# Patient Record
Sex: Male | Born: 1940 | Race: Black or African American | Hispanic: No | Marital: Married | Smoking: Former smoker
Health system: Southern US, Community
[De-identification: ages and names within clinical notes are randomized; demographics above are authoritative.]

## PROBLEM LIST (undated history)

## (undated) DIAGNOSIS — I739 Peripheral vascular disease, unspecified: Secondary | ICD-10-CM

## (undated) DIAGNOSIS — N189 Chronic kidney disease, unspecified: Secondary | ICD-10-CM

## (undated) DIAGNOSIS — Z77098 Contact with and (suspected) exposure to other hazardous, chiefly nonmedicinal, chemicals: Secondary | ICD-10-CM

## (undated) DIAGNOSIS — E785 Hyperlipidemia, unspecified: Secondary | ICD-10-CM

## (undated) DIAGNOSIS — N529 Male erectile dysfunction, unspecified: Secondary | ICD-10-CM

## (undated) DIAGNOSIS — Z89519 Acquired absence of unspecified leg below knee: Secondary | ICD-10-CM

## (undated) DIAGNOSIS — M1990 Unspecified osteoarthritis, unspecified site: Secondary | ICD-10-CM

## (undated) DIAGNOSIS — C801 Malignant (primary) neoplasm, unspecified: Secondary | ICD-10-CM

## (undated) DIAGNOSIS — I472 Ventricular tachycardia, unspecified: Secondary | ICD-10-CM

## (undated) DIAGNOSIS — I4729 Other ventricular tachycardia: Secondary | ICD-10-CM

## (undated) DIAGNOSIS — I10 Essential (primary) hypertension: Secondary | ICD-10-CM

## (undated) HISTORY — DX: Contact with and (suspected) exposure to other hazardous, chiefly nonmedicinal, chemicals: Z77.098

## (undated) HISTORY — DX: Chronic kidney disease, unspecified: N18.9

## (undated) HISTORY — DX: Ventricular tachycardia: I47.2

## (undated) HISTORY — DX: Acquired absence of unspecified leg below knee: Z89.519

## (undated) HISTORY — DX: Other ventricular tachycardia: I47.29

## (undated) HISTORY — DX: Male erectile dysfunction, unspecified: N52.9

## (undated) HISTORY — DX: Hyperlipidemia, unspecified: E78.5

## (undated) HISTORY — DX: Unspecified osteoarthritis, unspecified site: M19.90

## (undated) HISTORY — DX: Peripheral vascular disease, unspecified: I73.9

## (undated) HISTORY — PX: PROSTATE SURGERY: SHX751

## (undated) HISTORY — DX: Ventricular tachycardia, unspecified: I47.20

## (undated) HISTORY — DX: Malignant (primary) neoplasm, unspecified: C80.1

---

## 1998-11-29 ENCOUNTER — Other Ambulatory Visit: Admission: RE | Admit: 1998-11-29 | Discharge: 1998-11-29 | Payer: Self-pay | Admitting: Urology

## 1998-12-25 ENCOUNTER — Encounter: Admission: RE | Admit: 1998-12-25 | Discharge: 1999-03-25 | Payer: Self-pay | Admitting: *Deleted

## 1999-03-26 ENCOUNTER — Encounter: Admission: RE | Admit: 1999-03-26 | Discharge: 1999-06-24 | Payer: Self-pay | Admitting: *Deleted

## 2003-01-01 ENCOUNTER — Encounter (INDEPENDENT_AMBULATORY_CARE_PROVIDER_SITE_OTHER): Payer: Self-pay | Admitting: *Deleted

## 2003-01-01 ENCOUNTER — Ambulatory Visit (HOSPITAL_COMMUNITY): Admission: RE | Admit: 2003-01-01 | Discharge: 2003-01-01 | Payer: Self-pay | Admitting: Gastroenterology

## 2003-06-29 ENCOUNTER — Emergency Department (HOSPITAL_COMMUNITY): Admission: EM | Admit: 2003-06-29 | Discharge: 2003-06-29 | Payer: Self-pay | Admitting: Family Medicine

## 2007-03-10 HISTORY — PX: AMPUTATION: SHX166

## 2008-02-27 ENCOUNTER — Inpatient Hospital Stay (HOSPITAL_COMMUNITY): Admission: EM | Admit: 2008-02-27 | Discharge: 2008-03-01 | Payer: Self-pay | Admitting: Emergency Medicine

## 2008-02-28 ENCOUNTER — Ambulatory Visit: Payer: Self-pay | Admitting: Vascular Surgery

## 2008-02-28 ENCOUNTER — Ambulatory Visit: Payer: Self-pay | Admitting: Internal Medicine

## 2008-02-28 ENCOUNTER — Encounter (INDEPENDENT_AMBULATORY_CARE_PROVIDER_SITE_OTHER): Payer: Self-pay | Admitting: Emergency Medicine

## 2008-04-20 ENCOUNTER — Inpatient Hospital Stay (HOSPITAL_COMMUNITY): Admission: EM | Admit: 2008-04-20 | Discharge: 2008-04-23 | Payer: Self-pay | Admitting: Emergency Medicine

## 2008-04-23 ENCOUNTER — Encounter (INDEPENDENT_AMBULATORY_CARE_PROVIDER_SITE_OTHER): Payer: Self-pay | Admitting: Cardiology

## 2008-04-23 ENCOUNTER — Ambulatory Visit: Payer: Self-pay | Admitting: Vascular Surgery

## 2008-08-03 ENCOUNTER — Ambulatory Visit (HOSPITAL_COMMUNITY): Admission: RE | Admit: 2008-08-03 | Discharge: 2008-08-03 | Payer: Self-pay | Admitting: Cardiology

## 2009-02-07 ENCOUNTER — Ambulatory Visit: Payer: Self-pay | Admitting: Family Medicine

## 2009-03-14 ENCOUNTER — Ambulatory Visit: Payer: Self-pay | Admitting: Family Medicine

## 2009-04-01 ENCOUNTER — Ambulatory Visit: Payer: Self-pay | Admitting: Family Medicine

## 2009-05-03 ENCOUNTER — Ambulatory Visit: Payer: Self-pay | Admitting: Family Medicine

## 2009-06-21 ENCOUNTER — Ambulatory Visit: Payer: Self-pay | Admitting: Family Medicine

## 2009-08-01 ENCOUNTER — Ambulatory Visit: Payer: Self-pay | Admitting: Family Medicine

## 2009-08-08 ENCOUNTER — Ambulatory Visit: Payer: Self-pay | Admitting: Family Medicine

## 2009-11-01 ENCOUNTER — Ambulatory Visit: Payer: Self-pay | Admitting: Family Medicine

## 2009-11-04 ENCOUNTER — Ambulatory Visit: Payer: Self-pay | Admitting: Family Medicine

## 2009-11-08 ENCOUNTER — Ambulatory Visit: Payer: Self-pay | Admitting: Family Medicine

## 2009-12-25 ENCOUNTER — Encounter: Payer: Self-pay | Admitting: Family Medicine

## 2010-01-03 ENCOUNTER — Encounter: Payer: Self-pay | Admitting: Family Medicine

## 2010-01-04 ENCOUNTER — Encounter: Payer: Self-pay | Admitting: Family Medicine

## 2010-02-13 ENCOUNTER — Telehealth (INDEPENDENT_AMBULATORY_CARE_PROVIDER_SITE_OTHER): Payer: Self-pay | Admitting: *Deleted

## 2010-04-08 NOTE — Letter (Signed)
Summary: records from the [facility]  records from the [facility]   Imported By: Rosine Beat 01/03/2010 15:08:31  _____________________________________________________________________  External Attachment:    Type:   Image     Comment:   External Document

## 2010-04-08 NOTE — Medication Information (Signed)
Summary: PAPERWORK FROM [facility]  PAPERWORK FROM [facility]   Imported By: Rosine Beat 01/04/2010 14:17:17  _____________________________________________________________________  External Attachment:    Type:   Image     Comment:   External Document

## 2010-04-08 NOTE — Medication Information (Signed)
Summary: pharmacy fax  pharmacy fax   Imported By: Dorna Leitz 12/25/2009 13:58:42  _____________________________________________________________________  External Attachment:    Type:   Image     Comment:   External Document

## 2010-05-06 NOTE — Progress Notes (Signed)
  Phone Note Call from Patient   Caller: Patient Reason for Call: Refill Medication, Talk to Doctor Summary of Call: Patient called on 02/12/2010 to talking to Dr. Laural Benes regarding Insulin pump.  He would like to continue to see Dr. Laural Benes as a patient but he is unable to travel to [place].  He was wondering if Dr. Laural Benes could referral him to another primary care physician in the [place] area.  I talked to Dr. Laural Benes and he wanted to refer Mr. Hofferber to Dr. Margaretmary Bayley.  I will call the patient to let him know what Dr. Laural Benes had suggested.  Initial call taken by: Dorna Leitz,  February 13, 2010 12:36 PM  Follow-up for Phone Call        Please call patient and set him up an appointment with Dr. Margaretmary Bayley, Endocrinologist in [place], [place].  Dr. Chestine Spore is a physician in the [facility].  Rodney Langton, M.D., F.A.A.F.P.  February 13, 2010  Follow-up by: Rodney Langton  Additional Follow-up for Phone Call Additional follow up Details #1::        I spoke to the patient's wife and she states that he was at work and for me to call back on Friday morning to set up an appointment for him to see an endocrinologist.   Additional Follow-up by: Rosine Beat

## 2010-05-12 ENCOUNTER — Encounter: Payer: Self-pay | Admitting: Family Medicine

## 2010-05-12 ENCOUNTER — Ambulatory Visit: Payer: Medicare Other | Admitting: Family Medicine

## 2010-05-12 DIAGNOSIS — N183 Chronic kidney disease, stage 3 unspecified: Secondary | ICD-10-CM | POA: Insufficient documentation

## 2010-05-12 DIAGNOSIS — E119 Type 2 diabetes mellitus without complications: Secondary | ICD-10-CM | POA: Insufficient documentation

## 2010-05-12 DIAGNOSIS — Z794 Long term (current) use of insulin: Secondary | ICD-10-CM

## 2010-05-12 DIAGNOSIS — F332 Major depressive disorder, recurrent severe without psychotic features: Secondary | ICD-10-CM | POA: Insufficient documentation

## 2010-05-12 DIAGNOSIS — I739 Peripheral vascular disease, unspecified: Secondary | ICD-10-CM | POA: Insufficient documentation

## 2010-05-12 DIAGNOSIS — Z89519 Acquired absence of unspecified leg below knee: Secondary | ICD-10-CM | POA: Insufficient documentation

## 2010-05-12 DIAGNOSIS — N529 Male erectile dysfunction, unspecified: Secondary | ICD-10-CM

## 2010-05-12 DIAGNOSIS — E1165 Type 2 diabetes mellitus with hyperglycemia: Secondary | ICD-10-CM

## 2010-05-12 DIAGNOSIS — E118 Type 2 diabetes mellitus with unspecified complications: Secondary | ICD-10-CM

## 2010-05-12 DIAGNOSIS — E1139 Type 2 diabetes mellitus with other diabetic ophthalmic complication: Secondary | ICD-10-CM

## 2010-05-12 DIAGNOSIS — E1142 Type 2 diabetes mellitus with diabetic polyneuropathy: Secondary | ICD-10-CM

## 2010-05-12 DIAGNOSIS — IMO0002 Reserved for concepts with insufficient information to code with codable children: Secondary | ICD-10-CM

## 2010-05-12 DIAGNOSIS — Z8672 Personal history of thrombophlebitis: Secondary | ICD-10-CM

## 2010-05-13 ENCOUNTER — Telehealth: Payer: Self-pay | Admitting: Family Medicine

## 2010-05-20 NOTE — Assessment & Plan Note (Signed)
Summary: regarding diabete follow up   Vital Signs:  Patient Profile:   70 Years Old Male CC:      Follow Up Diabetes Mellitus  Weight:      250 pounds O2 Sat:      100 % O2 treatment:    Room Air Temp:     97.8 degrees F oral  Pulse rate:   67 / minute Pulse rhythm:   regular Resp:     12 per minute BP sitting:   126 / 71  (left arm)  Pt. in pain?   no  Vitals Entered By: Standley Dakins MD (May 12, 2010 5:57 PM)                   Current Allergies (reviewed today): No known allergies History of Present Illness History from: patient Chief Complaint: Follow Up Diabetes Mellitus  History of Present Illness: The patient presented today for diabetes follow up.  I had become increasingly concerned because I had not received notice from him that he had found an endocrinologist in [place] to see.   I requested to see him in order to complete the order request for his insulin pump supplies, etc.  He presents today and reports that he continues to see the CDE Ronal Fear in [place].  She has made some minor modifications to his insulin pump settings.  He reports continued labile blood sugars.  He reports occasional low blood sugars but no severe hypoglycemia reactions reported.    He reports the lowest BG was 73 and they always seem to be related to carbohydrate counting.  He reports that he continues to have a consistend diet.  He is telling me now that he consumes about 40 carbs per meal.  He says that he has toast, veggie burgers, veggies regularly.  He reports that his physical activity has diminished and his weight is increasing.  He is concerned about excessive flatus thought to be related to his veggie consumption and lactose intolerance.    His BG ranges have been 150 - 300.  He is reporting to me now that he does bolus for meals. He reports that his pump was recently downloaded by Ronal Fear and we can send to get those records with his permission which we have done. He  is reporting that he had a pneumovax 2 years ago.  He is interested in getting the shingles vaccine.  He is reporting that he is seeing his [place] physician and cardiologist and remains on Coumadin Warfarin (from previous blood clot).    ANIMAS INSULIN PUMP SETTINGS: Basal - 3.600 Units/HR TDD = 52.53 Bolus - I:C = 1:3 TDD = 16.65  ISF - 8 Target BG - 130 +/- 30 IOB - 4 hours  REVIEW OF SYSTEMS Constitutional Symptoms       Complains of weight gain and fatigue.     Denies fever, chills, night sweats, and weight loss.  Eyes       Complains of glasses.      Denies change in vision, eye pain, eye discharge, contact lenses, and eye surgery. Ear/Nose/Throat/Mouth       Denies hearing loss/aids, change in hearing, ear pain, ear discharge, dizziness, frequent runny nose, frequent nose bleeds, sinus problems, sore throat, hoarseness, and tooth pain or bleeding.  Respiratory       Denies dry cough, productive cough, wheezing, shortness of breath, asthma, bronchitis, and emphysema/COPD.  Cardiovascular       Denies murmurs, chest pain,  and tires easily with exhertion.    Gastrointestinal       Denies stomach pain, nausea/vomiting, diarrhea, constipation, blood in bowel movements, and indigestion. Genitourniary       Denies painful urination, kidney stones, and loss of urinary control. Neurological       Denies paralysis, seizures, and fainting/blackouts. Musculoskeletal       Denies muscle pain, joint pain, joint stiffness, decreased range of motion, redness, swelling, muscle weakness, and gout.      Comments: prosthetic right leg BKA Skin       Denies bruising, unusual mles/lumps or sores, and hair/skin or nail changes.  Psych       Complains of depression.      Denies mood changes, temper/anger issues, anxiety/stress, speech problems, and sleep problems.  Past History:  Past Medical History: Type 2 DM, insulin requiring, with triopathy - ON ANIMAS ONE TOUCH PING  INSULIN PUMP Metabolic  Syndrome HTN Hyperlipidemia PVD - s/p Right BKA H/O Blood Clot - On Coumadin Erectile Dysfunction  Past Surgical History: RIGHT BELOW KNEE AMPUTATION  Family History: Diabetes Mellitus Hypertension Alzheimer's Dementia  Social History: His moher bore him at age 55.  She lives with him now.  She has dementia and they care for her.  The patient is married.  He denies Tobacco, ETOH and recreational drug useage.  Physical Exam General appearance: well developed, well nourished, no acute distress Head: normocephalic, atraumatic Eyes: arcus senilis bilateral noted; conjunctivae and lids normal Pupils: equal, round, reactive to light Ears: normal, no lesions or deformities Nasal: mucosa pink, nonedematous, no septal deviation, turbinates normal Oral/Pharynx: dry mucous membranes, tongue normal, posterior pharynx without erythema or exudate Neck: neck supple,  trachea midline, no masses Chest/Lungs: no rales, wheezes, or rhonchi bilateral, breath sounds equal without effort Heart: regular rate and  rhythm, no murmur Abdomen: soft, non-tender without obvious organomegaly Extremities: right BKA with prosthesis in place.  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  The patient verbalized clear understanding.  The patient was given instructions to return if symptoms don't improve, worsen or new changes develop.  If it is not during clinic hours and the patient cannot get back to this clinic then the patient was told to seek medical care at an available urgent care or emergency department.  The patient verbalized understanding.   Demonstrates willingness to comply.  Plan Planning Comments:   Concentrated on Diet and Increasing Physical Activity Even it only involves Upper Extremities. The patient verbalized clear understanding.   Continue to follow up with Ronal Fear CDE and Sent for her records on the patient's visits.  Follow Up: 3 months   The patient and/or caregiver has been counseled thoroughly with regard to medications prescribed including dosage, schedule, interactions, rationale for use, and possible side effects and they verbalize understanding.  Diagnoses and expected course of recovery discussed and will return if not improved as expected or if the condition worsens. Patient  and/or caregiver verbalized understanding.   Patient Instructions: 1)  Check your blood sugars regularly. If your readings are usually above : or below 70 you should contact our office. 2)  It is important that your Diabetic A1c level is checked every 3 months. 3)  See your eye doctor yearly to check for diabetic eye damage. 4)  Check your feet each night for sore areas, calluses or signs of infection. 5)  Check your Blood Pressure regularly. If it is above: 140/90 you should make an appointment. 6)  It is important that you exercise regularly at least 20 minutes 5 times a week. If you develop chest pain, have severe difficulty breathing, or feel very tired , stop exercising immediately and seek medical attention. 7)  Go to the lab in [place] and get your labs done.  Please call us when you have had the labs done and we will call to get the results.  8)  You  need to lose weight. Consider a lower calorie diet and regular exercise.  9)  Try a lower carb diet and see if you can lose 5 lbs to 8 lbs by next office visit.  10)  Try to find an endocrinologist in [place] to follow you.  I recommend calling Dr. Ardis Rowan and ask if he is seeing any more new patients or his PA assistant physician.  11)  The patient was informed that there is no on-call provider or services available at this clinic during off-hours (when the clinic is closed).  If the patient developed a problem or concern that required immediate attention, the patient was advised to go the the nearest available urgent care or emergency department for medical care.  The patient verbalized understanding.          Hgb A1C:     8.9   %  I spent 2 hours directly with the patient today reviewing his insulin pump and settings, reviewing his complicated medical history, educating him on proper diabetes care and diet, encouraging a new exercise program that is challenging and meaningful and coordinating care with his specialty providers including requesting records from [place], CDE from [facility] in [place], [place], MD, CDE, FAAFP   Appended Document: regarding diabete follow up I reviewed patients complete medical records as faxed over to me from [facility].  Rodney Langton, MD, CDE, [employer]

## 2010-05-20 NOTE — Progress Notes (Signed)
  Phone Note Call from Patient   Summary of Call: Patient called in stated that [facility] has the vaccine for shingles pharmacy number is [phone] [facility] on [address] Initial call taken by: Eugenio Hoes,  May 13, 2010 5:11 PM    New/Updated Medications: ZOSTAVAX 45409 UNT/0.65ML SOLR (ZOSTER VACCINE LIVE) to be given as directed Prescriptions: ZOSTAVAX 81191 UNT/0.65ML SOLR (ZOSTER VACCINE LIVE) to be given as directed  #1 x 0   Entered and Authorized by:   Standley Dakins MD   Signed by:   Standley Dakins MD on 05/16/2010   Method used:   Electronically to        [facility] [address] [phone]* (retail)       [address]       [place], [place]  [address]       Ph: [phone] or [phone]       Fax: [phone]   RxID:   [id number]

## 2010-05-23 ENCOUNTER — Encounter: Payer: Self-pay | Admitting: Family Medicine

## 2010-05-24 ENCOUNTER — Telehealth: Payer: Self-pay | Admitting: Family Medicine

## 2010-05-24 ENCOUNTER — Encounter: Payer: Self-pay | Admitting: Family Medicine

## 2010-05-24 LAB — CONVERTED CEMR LAB
Albumin: 3.8 g/dL (ref 3.5–5.2)
Alkaline Phosphatase: 81 units/L (ref 39–117)
BUN: 38 mg/dL — ABNORMAL HIGH (ref 6–23)
Creatinine, Ser: 1.63 mg/dL — ABNORMAL HIGH (ref 0.40–1.50)
Glucose, Bld: 250 mg/dL — ABNORMAL HIGH (ref 70–99)
HCT: 38.8 % — ABNORMAL LOW (ref 39.0–52.0)
HDL: 28 mg/dL — ABNORMAL LOW (ref >39)
Hemoglobin: 12.7 g/dL — ABNORMAL LOW (ref 13.0–17.0)
LDL Cholesterol: 78 mg/dL (ref 0–99)
MCHC: 32.7 g/dL (ref 30.0–36.0)
MCV: 86.4 fL (ref 78.0–100.0)
Potassium: 4.7 meq/L (ref 3.5–5.3)
RBC: 4.49 M/uL (ref 4.22–5.81)
Total Bilirubin: 0.3 mg/dL (ref 0.3–1.2)
Total CHOL/HDL Ratio: 5.6
Triglycerides: 250 mg/dL — ABNORMAL HIGH (ref <150)

## 2010-05-27 NOTE — Progress Notes (Signed)
Summary: ZostaVax Vaccine Given on 03/24/10.   Phone Note Call from Patient   Caller: Patient Call For: Lab Results Summary of Call: The patient was notified of his lab results.  He said that he got the Shingles Vaccine yesterday at his pharmacy.  Rodney Langton, MD, CDE, FAAFP  Initial call taken by: Standley Dakins MD,  May 24, 2010 2:57 PM

## 2010-06-04 ENCOUNTER — Encounter: Payer: Self-pay | Admitting: Family Medicine

## 2010-06-10 NOTE — Letter (Signed)
Summary: [facility]  [facility]   Imported By: Eugenio Hoes 06/04/2010 12:52:47  _____________________________________________________________________  External Attachment:    Type:   Image     Comment:   External Document

## 2010-06-10 NOTE — Letter (Signed)
Summary: Medical record   Medical record   Imported By: Eugenio Hoes 06/04/2010 12:48:31  _____________________________________________________________________  External Attachment:    Type:   Image     Comment:   External Document

## 2010-06-24 LAB — CARDIAC PANEL(CRET KIN+CKTOT+MB+TROPI)
CK, MB: 2.9 ng/mL (ref 0.3–4.0)
Troponin I: <0.01 ng/mL (ref 0.00–0.06)

## 2010-06-24 LAB — GLUCOSE, CAPILLARY
Glucose-Capillary: 127 mg/dL — ABNORMAL HIGH (ref 70–99)
Glucose-Capillary: 154 mg/dL — ABNORMAL HIGH (ref 70–99)
Glucose-Capillary: 157 mg/dL — ABNORMAL HIGH (ref 70–99)
Glucose-Capillary: 164 mg/dL — ABNORMAL HIGH (ref 70–99)
Glucose-Capillary: 167 mg/dL — ABNORMAL HIGH (ref 70–99)
Glucose-Capillary: 367 mg/dL — ABNORMAL HIGH (ref 70–99)

## 2010-06-24 LAB — COMPREHENSIVE METABOLIC PANEL
AST: 21 U/L (ref 0–37)
BUN: 19 mg/dL (ref 6–23)
CO2: 27 mEq/L (ref 19–32)
Calcium: 9.2 mg/dL (ref 8.4–10.5)
Creatinine, Ser: 1.2 mg/dL (ref 0.4–1.5)
GFR calc Af Amer: >60 mL/min (ref >60)
GFR calc non Af Amer: >60 mL/min (ref >60)

## 2010-06-24 LAB — DIFFERENTIAL
Basophils Absolute: 0.1 10*3/uL (ref 0.0–0.1)
Basophils Relative: 1 % (ref 0–1)
Eosinophils Relative: 1 % (ref 0–5)
Lymphocytes Relative: 21 % (ref 12–46)
Monocytes Absolute: 0.7 10*3/uL (ref 0.1–1.0)

## 2010-06-24 LAB — URINALYSIS, ROUTINE W REFLEX MICROSCOPIC
Bilirubin Urine: NEGATIVE
Ketones, ur: NEGATIVE mg/dL
Nitrite: NEGATIVE
Protein, ur: >300 mg/dL — AB
Specific Gravity, Urine: 1.015 (ref 1.005–1.030)
Urobilinogen, UA: 1 mg/dL (ref 0.0–1.0)

## 2010-06-24 LAB — TSH: TSH: 0.918 u[IU]/mL (ref 0.350–4.500)

## 2010-06-24 LAB — PROTIME-INR
INR: 1 (ref 0.00–1.49)
INR: 1.1 (ref 0.00–1.49)
INR: 1.1 (ref 0.00–1.49)
Prothrombin Time: 14.5 seconds (ref 11.6–15.2)
Prothrombin Time: 18.8 seconds — ABNORMAL HIGH (ref 11.6–15.2)

## 2010-06-24 LAB — BASIC METABOLIC PANEL
BUN: 17 mg/dL (ref 6–23)
CO2: 25 mEq/L (ref 19–32)
Calcium: 9.3 mg/dL (ref 8.4–10.5)
Chloride: 98 mEq/L (ref 96–112)
Creatinine, Ser: 1.23 mg/dL (ref 0.4–1.5)
Glucose, Bld: 137 mg/dL — ABNORMAL HIGH (ref 70–99)
Glucose, Bld: 220 mg/dL — ABNORMAL HIGH (ref 70–99)
Sodium: 134 mEq/L — ABNORMAL LOW (ref 135–145)

## 2010-06-24 LAB — CBC
HCT: 34 % — ABNORMAL LOW (ref 39.0–52.0)
HCT: 34.3 % — ABNORMAL LOW (ref 39.0–52.0)
HCT: 37.1 % — ABNORMAL LOW (ref 39.0–52.0)
Hemoglobin: 11.6 g/dL — ABNORMAL LOW (ref 13.0–17.0)
Hemoglobin: 12.7 g/dL — ABNORMAL LOW (ref 13.0–17.0)
MCHC: 34.2 g/dL (ref 30.0–36.0)
MCV: 86 fL (ref 78.0–100.0)
MCV: 87 fL (ref 78.0–100.0)
Platelets: 201 10*3/uL (ref 150–400)
RBC: 3.99 MIL/uL — ABNORMAL LOW (ref 4.22–5.81)
RDW: 15.5 % (ref 11.5–15.5)
WBC: 11 10*3/uL — ABNORMAL HIGH (ref 4.0–10.5)
WBC: 9 10*3/uL (ref 4.0–10.5)

## 2010-06-24 LAB — TROPONIN I: Troponin I: <0.01 ng/mL (ref 0.00–0.06)

## 2010-06-24 LAB — HEPARIN LEVEL (UNFRACTIONATED): Heparin Unfractionated: 0.54 IU/mL (ref 0.30–0.70)

## 2010-06-24 LAB — POCT CARDIAC MARKERS: Troponin i, poc: 0.13 ng/mL — ABNORMAL HIGH (ref 0.00–0.09)

## 2010-06-24 LAB — CK TOTAL AND CKMB (NOT AT ARMC): Total CK: 124 U/L (ref 7–232)

## 2010-07-22 NOTE — Discharge Summary (Signed)
NAME:  Patrick, Shelton NO.:  [id number]   MEDICAL RECORD NO.:  [id number]          PATIENT TYPE:  INP   LOCATION:  2008                         FACILITY:  [facility]   PHYSICIAN:  Sheliah Mends, MD      DATE OF BIRTH:  [birth year]   DATE OF ADMISSION:  04/20/2008  DATE OF DISCHARGE:  04/23/2008                               DISCHARGE SUMMARY   DISCHARGE DIAGNOSES:  1. Pulmonary embolism bilaterally.  2. Recent right below-knee amputation in January 2010 in [place].  3. Type 2 insulin-dependent diabetes.  4. Treated hypertension.   HOSPITAL COURSE:  [name] is a 70 year old male who is followed at  the [place].  He has diabetes and vascular disease.  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  In November 2009, he had a right great toe amputation.  He then was  admitted in December with osteomyelitis of his right foot.  Ultimately,  his vascular surgeon in [facility] had to perform a right BKA, this was  done last month.  The patient had had some increasing shortness of  breath at home.  He was admitted through the emergency room.  CT scan  confirmed pulmonary embolism bilaterally.  He was put on Lovenox and  Coumadin.  We feel he can be discharged on April 23, 2008.  He will  need Lovenox bridging to Coumadin as an outpatient.  Venous Dopplers  were done prior to discharge, and the results of these are pending at  discharge.   DISCHARGE MEDICATIONS:  1. Elavil 25 mg nightly.  2. Glyburide 10 mg a day.  3. Hydrochlorothiazide 25 mg a day.  4. Lantus 80 units a day.  5. Lexapro 20 mg nightly.  6. Lisinopril 40 mg a day.  7. Norvasc 10 mg a day.  8. Pepcid 20 mg twice a day.  9. Valsartan 160 mg daily.  10.Zetia 10 mg a day.  11.Aspirin 81 mg a day.  12.Lovenox 100 mg subcu q.12 h.  13.Warfarin 5 mg 1-1/2 tablets daily or as directed.   LABORATORY DATA:  INR is 1.5.  White count 9, hemoglobin 11.6,  hematocrit 34, platelets 201.  Sodium 135, potassium 3.9, BUN 17,  creatinine  1.23.  CK-MB and troponin negative x1.  TSH is 0.91.  Urinalysis did show some proteinuria.  BNP was 50.   Chest x-ray in the ER showed plate-like atelectasis in left lower lobe.  CT revealed moderately large bilateral pulmonary emboli involving the  right upper lobe and both lower lobes.  EKG, sinus rhythm, PVCs, and  nonspecific ST changes.   DISPOSITION:  The patient is discharged in stable condition.  He has an  appointment at the [facility] today.  We will arrange for a home health nurse to  come out and draw a pro time on Wednesday.      Abelino Derrick, P.A.      Sheliah Mends, MD  Electronically Signed    LKK/MEDQ  D:  04/23/2008  T:  04/23/2008  Job:  [id number]   cc:   Camie Patience  [facility] [address]

## 2010-07-22 NOTE — Discharge Summary (Signed)
NAME:  Patrick Shelton, Patrick Shelton NO.:  [id number]   MEDICAL RECORD NO.:  [id number]          PATIENT TYPE:  INP   LOCATION:  2008                         FACILITY:  [facility]   PHYSICIAN:  Abelino Derrick, P.A.   DATE OF BIRTH:  [birth year]   DATE OF ADMISSION:  04/20/2008  DATE OF DISCHARGE:  04/23/2008                               DISCHARGE SUMMARY   ADDENDUM   At the recommendations of the pharmacist, Patrick Shelton was sent home on  Lovenox 150 mg subcu daily for 4 days.  Protime and follow up will be as  previously indicated in our office.      Abelino Derrick, P.ALenard Lance  D:  04/23/2008  T:  04/24/2008  Job:  [id number]

## 2010-07-22 NOTE — H&P (Signed)
NAME:  Patrick Shelton, Patrick Shelton NO.:  [id number]   MEDICAL RECORD NO.:  [id number]          PATIENT TYPE:  EMS   LOCATION:  ED                           [facility]   PHYSICIAN:  Peggye Pitt, M.D. DATE OF BIRTH:  [birth year]   DATE OF ADMISSION:  02/27/2008  DATE OF DISCHARGE:                              HISTORY & PHYSICAL   PRIMARY CARE PHYSICIAN:  [facility] in [place].  He is not familiar  with his doctor's name.   CHIEF COMPLAINT:  Right foot pain.   HISTORY OF PRESENT ILLNESS:  Patrick Shelton is a pleasant 70 year old  African American man with history of type 2 diabetes who on January 09, 2008, had am amputation of his left big toe secondary to osteomyelitis.  We have no records of this as all this was performed in [facility].  He  states that over the past week or so, he has experienced increase in his  right foot pain to the point where he is now on OxyContin, gabapentin  and Vicodin for the pain.  Today, he noticed some purulent drainage with  increased pain.  For that reason, he came into the emergency department  for further evaluation.  He denies any fever or chills, chest pain,  shortness of breath or any other symptoms.   ALLERGIES:  He has no known drug allergies.   PAST MEDICAL HISTORY:  Significant for the following:  1. Type 2 diabetes.  2. Hyperlipidemia.  3. Hypertension.   HOME MEDICATIONS:  1. OxyContin 10 mg p.o. b.i.d.  2. Neurontin 300 mg p.o. t.i.d.  3. Zetia 10 mg daily.  4. Hydrochlorothiazide 25 mg p.o. daily.  5. Vicodin 7.5/750 mg p.o. q.4 h. p.r.n. for pain.  6. Lisinopril 40 mg p.o. b.i.d.  7. Diovan 160 mg p.o. daily.   SOCIAL HISTORY:  He denies any tobacco, alcohol or illicit drug use.  He  is married and lives with his wife.   FAMILY HISTORY:  Only significant for multiple family members with  diabetes as well.   REVIEW OF SYSTEMS:  Negative except as otherwise mentioned in HPI.   PHYSICAL EXAMINATION:  VITAL  SIGNS:  On admission, blood pressure  152/76, heart rate 72, respirations 18, O2 saturation 96% on room air  with a temp of 98.3.  IN GENERAL:  He is alert, awake, oriented x3.  He does not appear to be  in any distress.  HEENT:  Normocephalic, atraumatic.  His pupils are equally reactive to  light and accommodation with intact extraocular movements.  HEART:  Regular rate and rhythm with no murmurs, rubs, or gallops.  He  does have frequent extra systolic beat.  LUNGS:  Clear to auscultation bilaterally.  ABDOMEN:  Soft, nontender, nondistended with positive bowel sounds.  NEUROLOGIC:  Grossly intact and nonfocal.  EXTREMITIES:  His right foot has a big toe amputation with some  purulence emanating from the stump.  He also has some erythema and edema  extending over the dorsum of his foot.  He does have positive pedal  pulses.   LABORATORIES UPON ADMISSION:  Sodium 137, potassium 4.8, chloride 101,  bicarb 25, BUN 16, creatinine 1.09 with a glucose of 130, WBCs 16.9 with  an ANC of 13.1, hemoglobin 10.4, platelets 313, BNP of 251 and lactic  acid of 1.1.   A right foot x-ray shows osteomyelitis of the first metatarsal neck,  infection with gas-forming organism in soft tissue surrounding the  second MTP joint.   ASSESSMENT/PLAN:  1. Right first metatarsal osteomyelitis.  We will place on intravenous      vancomycin and Zosyn, dose per pharmacy.  We will check blood      cultures x2.  He will need an orthopedic consultation in the      morning.  We will also have wound care evaluate his stump      infection.  2. For his diabetes, we will check a hemoglobin A1c.  We will place      him on sliding scale insulin.  3. Hypertension.  We will continue his home medications and monitor      his blood pressure.  4. Hyperlipidemia.  We will check a fasting lipid panel and continue      his home dose of Zetia.  5. For prophylaxis while in the hospital, he will be placed on      Protonix for  gastrointestinal prophylaxis and Lovenox for deep      venous thrombosis prophylaxis.      Peggye Pitt, M.D.  Electronically Signed     EH/MEDQ  D:  02/27/2008  T:  02/27/2008  Job:  [id number]

## 2010-07-22 NOTE — Consult Note (Signed)
NAME:  COLVIN, BLATT NO.:  [id number]   MEDICAL RECORD NO.:  [id number]          PATIENT TYPE:  INP   LOCATION:  1302                         FACILITY:  [facility]   PHYSICIAN:  Almedia Balls. Ranell Patrick, M.D. DATE OF BIRTH:  09-Jan-1941   DATE OF CONSULTATION:  02/28/2008  DATE OF DISCHARGE:                                 CONSULTATION   REASON FOR CONSULTATION:  Right foot osteomyelitis.   HISTORY OF PRESENT ILLNESS:  The patient is a 70 year old African  American male with type 2 diabetes and a history of right foot grade 2  amputation performed by Dr. Carollee Massed, a vascular surgeon in the [facility] system.  The patient had surgery on January 09, 2008.  He  presents to the [facility] [employer] [facility], for  evaluation of a right foot infection.  The patient was followed as  recently as last week by his vascular surgeon in [facility], who felt  like things were healing well.  He had some type of a vascular procedure  done but is unaware of exactly what that procedure was, but by the  patient's report everything looked okay as far as potential blood flow  to his leg.  He is complaining of debilitating pain, and he is on  OxyContin for pain in his foot.  He notes purulent drainage and  increased swelling and pain.   PAST MEDICAL HISTORY:  1. History of type 2 diabetes.  2. Hyperlipidemia.  3. Hypertension.   MEDICATION LIST:  Please see MAR.   SOCIAL HISTORY:  The patient denies tobacco use or alcohol use.  He is  married and lives with his wife, and his son is present here in the  room.   PHYSICAL EXAMINATION:  GENERAL:  A healthy-appearing African American  male, who is in moderate distress, alert and oriented.   Examination of the right foot with Dr. Cliffton Asters, Infectious  Disease, in attendance shows a nonhealing wound to the distal great toe  metatarsophalangeal area.  There is also evidence of a significant open  wound around the base  of the second toe where the toe meets the foot.  There is significant swelling on the right leg compared to the left leg  up to about the shin level.  I cannot appreciate pulses in the right  foot.  He does have trace pulses palpable in the left foot.  There is  absence of hair below the knee.  Radiographs demonstrating evidence of a  metatarsal neck fracture, potential osteomyelitis, as well as the foot  looks like osteomyelitis of the second toe MTP joint.   IMPRESSION:  Right foot infection status post a grade 2 amputation  performed by Vascular Surgery in the [facility] system.   PLAN:  I recommend obtaining records from Dr. Carollee Massed regarding any  vascular studies that were done.  Unfortunately, ABIs could not be done  today due to severe calcification of his vessels.  I suspect based on  the wound that I am seeing that this looks like a vascular insufficient  wound.  We  would certainly need Vascular's input as to whether or not  they could do anything to improve blood flow to the limb.  Otherwise, I  really do not see him healing even a transmetatarsal wound.  He will  likely need a below-knee amputation again pending vascular studies  and a vascular evaluation here in [place].  I appreciate Dr.  Blair Dolphin assistance, and we will follow this patient and provide  whatever care we can, and recommend broad-spectrum antibiotics for  coverage at this point and local wound care and dry dressing changes to  the foot daily.      Almedia Balls. Ranell Patrick, M.D.  Electronically Signed     SRN/MEDQ  D:  02/28/2008  T:  02/28/2008  Job:  [id number]

## 2010-07-22 NOTE — Discharge Summary (Signed)
NAME:  Patrick Shelton, Patrick Shelton NO.:  [id number]   MEDICAL RECORD NO.:  [id number]          PATIENT TYPE:  INP   LOCATION:  1302                         [facility]ACILITY[facility]  [facility]   PHYSICIAN:  Eduard Clos, MDDATE OF BIRTH:  1940-11-11   DATE OF ADMISSION:  02/27/2008  DATE OF DISCHARGE:                               DISCHARGE SUMMARY   HOSPITAL COURSE:  A 70 year old male with known history of diabetes  mellitus type 2, hyperlipidemia, hypertension who had a right great toe  amputation in November first week at [facility]  presented here complaining of increasing foot pain.  On admission the  patient had an x-ray which showed possibility of osteomyelitis.  The  patient had a wound with some drainage.  Cultures were obtained.  At  this time cultures are showing Enterococcus.  The patient was admitted  to medical floor and orthopedic and infectious disease consults were  obtained.  Per Dr. Ranell Patrick the patient underwent ABI which were not  possible to record due to severe atherosclerosis.  Records were obtained  from [facility] which did show normal ABI.  At this time  Dr. Ranell Patrick feels that as the patient has been having extensive workup  done with Dr. Carollee Massed at [facility] it would be advisable  to send the patient back to Dr. Carollee Massed for further workup with p.o.  antibiotics and pain relief medication and already a vascular surgery  consult at this hospital and do further procedures during the stay at  this hospital.  At this time the patient has resolved going back to Dr.  Carollee Massed.  I will discuss with Dr. Orvan Falconer, infectious disease, consult  about antibiotics necessary for discharge.  He advised the patient to be  on Augmentin for 6 weeks expecting that the patient will be following up  with Dr. Carollee Massed, vascular surgeon at [facility], at this time.   PROCEDURES:  X-ray of the foot on March 01, 2008, shows changes of  osteomyelitis involving the first metatarsal neck of the right foot,  infection with a gas forming in the soft tissues surrounding the second  MTP joint.   FINAL DIAGNOSES:  1. Right foot infection with osteomyelitis.  2. Diabetes mellitus type 2.  3. Hypertension.  4. Hyperlipidemia.  5. Pain right foot.   DISCHARGE MEDICATION:  1. OxyContin 10 mg p.o. b.i.d.  2. Neurontin 300 mg p.o. t.i.d.  3. Zetia 20 mg p.o. daily.  4. HCTZ 25 mg p.o. daily.  5. Lisinopril 40 mg p.o. daily.  6. Lantus insulin 80 units subcutaneous b.i.d.  7. Augmentin 875 mg p.o. daily for 6 weeks.  8. Percocet 7.5/325 mg p.o. q.4 hours p.r.n. for pain.   FOLLOWUP:  The patient was advised to follow up with his primary care  physician within a week's time to recheck his complete metabolic panel  and to follow up with Dr. Carollee Massed, vascular surgeon, at [facility] within  a week's time  as soon as possible for definitive treatment of his right foot infection  and osteomyelitis to which the patient  agreed.  The patient is to be on  a cardiac healthy, carb modified diet.  Activity as tolerated.  To check  his blood sugars a.c. and h.s. to be cautious about hypoglycemia.      Eduard Clos, MD  Electronically Signed     ANK/MEDQ  D:  03/01/2008  T:  03/01/2008  Job:  [id number]

## 2010-07-22 NOTE — H&P (Signed)
NAME:  Patrick Shelton, Patrick Shelton NO.:  [id number]   MEDICAL RECORD NO.:  [id number]          PATIENT TYPE:  INP   LOCATION:  1824                         FACILITY:  [facility]   PHYSICIAN:  Sheliah Mends, MD      DATE OF BIRTH:  11/10/1940   DATE OF ADMISSION:  04/20/2008  DATE OF DISCHARGE:                              HISTORY & PHYSICAL   CHIEF COMPLAINT:  Shortness of breath.   HISTORY OF PRESENT ILLNESS:  Mr. Pulliam is a 70 year old male who is  followed at the [place].  He has a history of diabetes and vascular disease.  In November 2009,  he had a right great toe amputation and then was  admitted to [facility] in December with osteomyelitis.  He was referred back  to his vascular surgeon and [place].  In January 2010, he apparently  underwent a right BKA in [place].  He was discharged from the  hospital.  He is admitted now through the emergency room with complaints  of increasing shortness of breath over the last 2 days.  He denies any  chest pain.  He denies any orthopnea.  He feels a vague sense of  breathlessness.  He does have some nonspecific EKG changes in the  emergency room.   CURRENT MEDICATIONS:  Pepcid 20 mg a day, Elavil 25 mg a day, Lexapro 20  mg a day, Norvasc 10 mg a day, Lantus 80 in the morning, 25 at night,  lisinopril 40 mg, Diovan 160 mg a day, HCTZ 25 mg a day, Zetia 10 mg a  day, OxyContin 10 mg b.i.d.   ALLERGIES:  He has no known drug allergies.   SOCIAL HISTORY:  He is married.  He has two children.  He is a  nonsmoker.  He does not drink alcohol.  He is to work as a Network engineer.   FAMILY HISTORY:  Remarkable for diabetes.   PAST MEDICAL HISTORY:  The patient's past medical history is otherwise  unremarkable, he has hypertension, dyslipidemia, insulin-dependent  diabetes, and prior right BKA as noted.   REVIEW OF SYSTEMS:  Essentially unremarkable except where noted above,  he has had no prior cardiac evaluation.   PHYSICAL  EXAMINATION:  Blood pressure 160/70, pulse 80, O2 sat 100% on 3  liters.  GENERAL:  He is a well-developed, well-nourished African American male  in no acute distress.  HEENT:  Normocephalic.  Extraocular movements are intact.  Sclerae are  nonicteric.  NECK:  Without JVD or bruit.  CHEST:  Clear to auscultation and percussion.  CARDIAC:  Reveals regular rate and rhythm with soft systolic murmur at  the aortic valve area and normal S1, S2.  ABDOMEN:  Nontender.  No hepatosplenomegaly.  EXTREMITIES:  Without edema.  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  NEURO:  Exam is grossly intact.  He is awake, alert, oriented, and  cooperative.  He moves all extremities without obvious deficit.  SKIN:  Warm and dry.   EKG shows sinus rhythm with nonspecific ST changes.   LABORATORY DATA:  Sodium 134, potassium 4.5, BUN 21, creatinine  1.25.  White count 12.3, hemoglobin 12.7, hematocrit 37.1, platelets 242.  His  troponin is slightly elevated at 0.13.  Chest x-ray shows plate-like  atelectasis.   IMPRESSION:  1. Dyspnea, rule out pulmonary embolism.  2. Insulin-dependent diabetes.  3. Peripheral vascular disease, status post recent right below the      knee amputation in [facility].  4. Treated dyslipidemia.  5. Treated hypertension.  6. Soft systolic murmur consistent with aortic valve sclerosis.   PLAN:  The patient will be admitted to telemetry.  He is started on  heparin.  Will go head and get a CT angiogram of his chest to rule out  pulmonary embolism.  Will get troponins and follow-up tonight and  tomorrow.  Will also trying him on the schedule for a Lexiscan Myoview  if possible.  At some point, he will need echo.      Abelino Derrick, P.A.      Sheliah Mends, MD  Electronically Signed    LKK/MEDQ  D:  04/20/2008  T:  04/20/2008  Job:  [id number]

## 2010-07-25 NOTE — Op Note (Signed)
NAME:  Patrick Shelton, Patrick Shelton NO.:  [id number]   MEDICAL RECORD NO.:  [id number]                   PATIENT TYPE:  AMB   LOCATION:  ENDO                                 FACILITY:  [facility]   PHYSICIAN:  Anselmo Rod, M.D.               DATE OF BIRTH:  May 03, 1940   DATE OF PROCEDURE:  01/01/2003  DATE OF DISCHARGE:                                 OPERATIVE REPORT   PROCEDURE PERFORMED:  Colonoscopy with snare polypectomy times one and cold  biopsies times two.   ENDOSCOPIST:  Anselmo Rod, M.D.   INSTRUMENT USED:  Olympus video colonoscope.   INDICATION FOR PROCEDURE:  Screening colonoscopy is being performed in a 7-  year-old [place] American male to rule out colonic polyps, masses, etc.   PREPROCEDURE PREPARATION:  Informed consent was procured from the patient.  The patient was fasted for eight hours prior to the procedure and was  prepped with a bottle of magnesium citrate and a gallon of GoLYTELY the  night prior to the procedure.   PREPROCEDURE PHYSICAL:  VITAL SIGNS:  The patient had stable vital signs.  NECK:  Neck supple.  CHEST:  Chest clear to auscultation.  S1 and S2 regular.  ABDOMEN:  Abdomen soft with normal bowel sounds.   DESCRIPTION OF THE PROCEDURE:  The patient was placed in the left lateral  decubitus position and sedated with 50 mg of Demerol and 5 mg of Versed  intravenously.  Once the patient was adequately sedated and maintained on  low-flow oxygen and continuous cardiac monitoring, the Olympus video  colonoscope was advanced from the rectum to the cecum with difficulty.  There was a large amount of residual stool in the colon and multiple washes  were done.  A few scattered diverticula were seen, some of them in the  cecum.  There was a flat polyp which was snared from 80 cm and another flat  polyp was biopsied from 70 cm.  Small internal hemorrhoids were seen on  retroflexion.  The appendiceal orifice and ileocecal valve  were clearly  visualized and photographed.  Small lesions could have been missed secondary  to a relatively poor prep.  The patient, however, tolerated the procedure  well without complications.   IMPRESSION:  1. Small non-bleeding internal hemorrhoids.  2. A few scattered diverticula, some in the cecum.  3. Two polyps removed from the colon from 80 and 70 cm; one was snared and     one was cold-biopsied.    RECOMMENDATIONS:  1. Await pathology results.  2. Avoid all nonsteroidals including aspirin for the next two weeks.  3. Outpatient followup in the next two weeks for further recommendations.  Anselmo Rod, M.D.    JNM/MEDQ  D:  01/01/2003  T:  01/01/2003  Job:  [id number]   cc:   Margaretmary Bayley, M.D.  [address], [address]  [place]  [place] [address]  Fax: [phone]

## 2010-10-16 ENCOUNTER — Encounter (HOSPITAL_COMMUNITY): Payer: Self-pay | Admitting: Radiology

## 2010-10-16 ENCOUNTER — Emergency Department (HOSPITAL_COMMUNITY): Payer: Medicare Other

## 2010-10-16 ENCOUNTER — Inpatient Hospital Stay (HOSPITAL_COMMUNITY)
Admission: EM | Admit: 2010-10-16 | Discharge: 2010-10-19 | DRG: 176 | Disposition: A | Discharge status: Home Health | Payer: Medicare Other | Attending: Family Medicine | Admitting: Family Medicine

## 2010-10-16 DIAGNOSIS — Z794 Long term (current) use of insulin: Secondary | ICD-10-CM

## 2010-10-16 DIAGNOSIS — Z79899 Other long term (current) drug therapy: Secondary | ICD-10-CM

## 2010-10-16 DIAGNOSIS — R0902 Hypoxemia: Secondary | ICD-10-CM | POA: Diagnosis present

## 2010-10-16 DIAGNOSIS — N179 Acute kidney failure, unspecified: Secondary | ICD-10-CM | POA: Diagnosis present

## 2010-10-16 DIAGNOSIS — E119 Type 2 diabetes mellitus without complications: Secondary | ICD-10-CM | POA: Diagnosis present

## 2010-10-16 DIAGNOSIS — R748 Abnormal levels of other serum enzymes: Secondary | ICD-10-CM | POA: Diagnosis present

## 2010-10-16 DIAGNOSIS — Z9641 Presence of insulin pump (external) (internal): Secondary | ICD-10-CM

## 2010-10-16 DIAGNOSIS — S88119A Complete traumatic amputation at level between knee and ankle, unspecified lower leg, initial encounter: Secondary | ICD-10-CM

## 2010-10-16 DIAGNOSIS — Z86718 Personal history of other venous thrombosis and embolism: Secondary | ICD-10-CM

## 2010-10-16 DIAGNOSIS — I2699 Other pulmonary embolism without acute cor pulmonale: Principal | ICD-10-CM | POA: Diagnosis present

## 2010-10-16 DIAGNOSIS — I10 Essential (primary) hypertension: Secondary | ICD-10-CM | POA: Diagnosis present

## 2010-10-16 DIAGNOSIS — E785 Hyperlipidemia, unspecified: Secondary | ICD-10-CM | POA: Diagnosis present

## 2010-10-16 HISTORY — DX: Essential (primary) hypertension: I10

## 2010-10-16 LAB — DIFFERENTIAL
Basophils Relative: 0 % (ref 0–1)
Eosinophils Absolute: 0.1 10*3/uL (ref 0.0–0.7)
Eosinophils Relative: 1 % (ref 0–5)
Lymphs Abs: 1.9 10*3/uL (ref 0.7–4.0)
Neutrophils Relative %: 78 % — ABNORMAL HIGH (ref 43–77)

## 2010-10-16 LAB — CBC
MCH: 29.7 pg (ref 26.0–34.0)
MCV: 86 fL (ref 78.0–100.0)
Platelets: 183 10*3/uL (ref 150–400)
RBC: 4.35 MIL/uL (ref 4.22–5.81)
RDW: 14.1 % (ref 11.5–15.5)
WBC: 12.8 10*3/uL — ABNORMAL HIGH (ref 4.0–10.5)

## 2010-10-16 LAB — COMPREHENSIVE METABOLIC PANEL
CO2: 24 mEq/L (ref 19–32)
Calcium: 9.2 mg/dL (ref 8.4–10.5)
Creatinine, Ser: 2.13 mg/dL — ABNORMAL HIGH (ref 0.50–1.35)
GFR calc Af Amer: 37 mL/min — ABNORMAL LOW (ref >60)
GFR calc non Af Amer: 31 mL/min — ABNORMAL LOW (ref >60)
Glucose, Bld: 185 mg/dL — ABNORMAL HIGH (ref 70–99)
Total Protein: 7.9 g/dL (ref 6.0–8.3)

## 2010-10-16 LAB — POCT I-STAT TROPONIN I: Troponin i, poc: 0.07 ng/mL (ref 0.00–0.08)

## 2010-10-16 LAB — PROTIME-INR: Prothrombin Time: 13.9 seconds (ref 11.6–15.2)

## 2010-10-16 LAB — GLUCOSE, CAPILLARY
Glucose-Capillary: 184 mg/dL — ABNORMAL HIGH (ref 70–99)
Glucose-Capillary: 257 mg/dL — ABNORMAL HIGH (ref 70–99)

## 2010-10-16 LAB — TROPONIN I: Troponin I: <0.3 ng/mL (ref <0.30)

## 2010-10-16 MED ORDER — IOHEXOL 350 MG/ML SOLN
100.0000 mL | Freq: Once | INTRAVENOUS | Status: AC | PRN
  Administered 2010-10-16: 100 mL via INTRAVENOUS

## 2010-10-17 ENCOUNTER — Encounter: Payer: Self-pay | Admitting: Family Medicine

## 2010-10-17 LAB — LUPUS ANTICOAGULANT PANEL
DRVVT: 46.6 secs — ABNORMAL HIGH (ref 34.1–42.2)
Lupus Anticoagulant: NOT DETECTED
PTTLA 4:1 Mix: 104.6 secs — ABNORMAL HIGH (ref 30.0–45.6)
PTTLA Confirmation: 11.1 secs — ABNORMAL HIGH (ref <8.0)
dRVVT Incubated 1:1 Mix: 39.2 secs (ref 34.1–42.2)

## 2010-10-17 LAB — CBC
MCV: 85.6 fL (ref 78.0–100.0)
Platelets: 196 10*3/uL (ref 150–400)
RBC: 4.45 MIL/uL (ref 4.22–5.81)
WBC: 16.2 10*3/uL — ABNORMAL HIGH (ref 4.0–10.5)

## 2010-10-17 LAB — GLUCOSE, CAPILLARY
Glucose-Capillary: 190 mg/dL — ABNORMAL HIGH (ref 70–99)
Glucose-Capillary: 169 mg/dL — ABNORMAL HIGH (ref 70–99)

## 2010-10-17 LAB — HOMOCYSTEINE: Homocysteine: 17.4 umol/L — ABNORMAL HIGH (ref 4.0–15.4)

## 2010-10-17 LAB — BASIC METABOLIC PANEL
CO2: 19 mEq/L (ref 19–32)
Calcium: 9.1 mg/dL (ref 8.4–10.5)
Creatinine, Ser: 2.01 mg/dL — ABNORMAL HIGH (ref 0.50–1.35)
GFR calc Af Amer: 40 mL/min — ABNORMAL LOW (ref >60)
GFR calc non Af Amer: 33 mL/min — ABNORMAL LOW (ref >60)
Sodium: 135 mEq/L (ref 135–145)

## 2010-10-17 LAB — PROTIME-INR
INR: 1.14 (ref 0.00–1.49)
Prothrombin Time: 14.8 seconds (ref 11.6–15.2)

## 2010-10-17 LAB — HEPARIN LEVEL (UNFRACTIONATED): Heparin Unfractionated: 0.52 IU/mL (ref 0.30–0.70)

## 2010-10-18 DIAGNOSIS — I2699 Other pulmonary embolism without acute cor pulmonale: Secondary | ICD-10-CM

## 2010-10-18 LAB — DIFFERENTIAL
Basophils Absolute: 0 10*3/uL (ref 0.0–0.1)
Basophils Relative: 0 % (ref 0–1)
Lymphocytes Relative: 29 % (ref 12–46)
Monocytes Absolute: 0.9 10*3/uL (ref 0.1–1.0)
Neutro Abs: 10.4 10*3/uL — ABNORMAL HIGH (ref 1.7–7.7)

## 2010-10-18 LAB — GLUCOSE, CAPILLARY
Glucose-Capillary: 112 mg/dL — ABNORMAL HIGH (ref 70–99)
Glucose-Capillary: 119 mg/dL — ABNORMAL HIGH (ref 70–99)
Glucose-Capillary: 58 mg/dL — ABNORMAL LOW (ref 70–99)
Glucose-Capillary: 89 mg/dL (ref 70–99)
Glucose-Capillary: 189 mg/dL — ABNORMAL HIGH (ref 70–99)

## 2010-10-18 LAB — BASIC METABOLIC PANEL
BUN: 39 mg/dL — ABNORMAL HIGH (ref 6–23)
CO2: 22 mEq/L (ref 19–32)
Calcium: 9.3 mg/dL (ref 8.4–10.5)
Chloride: 108 mEq/L (ref 96–112)
Creatinine, Ser: 1.55 mg/dL — ABNORMAL HIGH (ref 0.50–1.35)

## 2010-10-18 LAB — CBC
HCT: 41 % (ref 39.0–52.0)
Hemoglobin: 13.6 g/dL (ref 13.0–17.0)
MCHC: 33.2 g/dL (ref 30.0–36.0)
RDW: 14.5 % (ref 11.5–15.5)
WBC: 16.6 10*3/uL — ABNORMAL HIGH (ref 4.0–10.5)

## 2010-10-18 LAB — CARDIAC PANEL(CRET KIN+CKTOT+MB+TROPI)
Relative Index: 0.8 (ref 0.0–2.5)
Relative Index: 0.8 (ref 0.0–2.5)
Total CK: 1770 U/L — ABNORMAL HIGH (ref 7–232)
Total CK: 1450 U/L — ABNORMAL HIGH (ref 7–232)
Troponin I: <0.3 ng/mL (ref <0.30)
Troponin I: <0.3 ng/mL (ref <0.30)

## 2010-10-18 LAB — PROTIME-INR
INR: 1.31 (ref 0.00–1.49)
Prothrombin Time: 16.5 seconds — ABNORMAL HIGH (ref 11.6–15.2)

## 2010-10-18 LAB — HEPARIN LEVEL (UNFRACTIONATED): Heparin Unfractionated: <0.1 IU/mL — ABNORMAL LOW (ref 0.30–0.70)

## 2010-10-19 LAB — CBC
Hemoglobin: 13.7 g/dL (ref 13.0–17.0)
MCH: 29.1 pg (ref 26.0–34.0)
Platelets: 224 10*3/uL (ref 150–400)
RBC: 4.7 MIL/uL (ref 4.22–5.81)

## 2010-10-19 LAB — PROTIME-INR
INR: 1.85 — ABNORMAL HIGH (ref 0.00–1.49)
Prothrombin Time: 21.7 seconds — ABNORMAL HIGH (ref 11.6–15.2)

## 2010-10-19 LAB — GLUCOSE, CAPILLARY: Glucose-Capillary: 128 mg/dL — ABNORMAL HIGH (ref 70–99)

## 2010-10-19 LAB — BASIC METABOLIC PANEL
BUN: 29 mg/dL — ABNORMAL HIGH (ref 6–23)
CO2: 23 mEq/L (ref 19–32)
Chloride: 106 mEq/L (ref 96–112)
Creatinine, Ser: 1.28 mg/dL (ref 0.50–1.35)
GFR calc Af Amer: >60 mL/min (ref >60)
Glucose, Bld: 69 mg/dL — ABNORMAL LOW (ref 70–99)
Potassium: 4.4 mEq/L (ref 3.5–5.1)

## 2010-10-19 LAB — CK TOTAL AND CKMB (NOT AT ARMC): Relative Index: 0.9 (ref 0.0–2.5)

## 2010-10-20 LAB — CARDIOLIPIN ANTIBODIES, IGG, IGM, IGA
Anticardiolipin IgG: 17 GPL U/mL (ref <23)
Anticardiolipin IgM: 18 MPL U/mL — ABNORMAL HIGH (ref <11)

## 2010-10-20 LAB — BETA-2-GLYCOPROTEIN I ABS, IGG/M/A
Beta-2 Glyco I IgG: 0 G Units (ref <20)
Beta-2-Glycoprotein I IgM: 1 M Units (ref <20)

## 2010-10-21 LAB — PROTEIN S, TOTAL: Protein S Ag, Total: 225 % — ABNORMAL HIGH (ref 60–150)

## 2010-10-21 LAB — GLUCOSE, CAPILLARY
Glucose-Capillary: 68 mg/dL — ABNORMAL LOW (ref 70–99)
Glucose-Capillary: 150 mg/dL — ABNORMAL HIGH (ref 70–99)

## 2010-10-21 LAB — FACTOR 5 LEIDEN

## 2010-10-22 LAB — PROTHROMBIN GENE MUTATION

## 2010-11-05 NOTE — H&P (Signed)
NAME:  Patrick Shelton, Patrick Shelton NO.:  [id number]  MEDICAL RECORD NO.:  [id number]  LOCATION:  WLED                         FACILITY:  [facility]  PHYSICIAN:  Mauro Kaufmann, MD         DATE OF BIRTH:  [birth year]  DATE OF ADMISSION:  10/16/2010 DATE OF DISCHARGE:                             HISTORY & PHYSICAL   PRIMARY CARE PHYSICIAN:  The patient has no primary care physician.  CHIEF COMPLAINT:  Shortness of breath.  HISTORY OF PRESENT ILLNESS:  This is a 70 year old male who came to the hospital for worsening of shortness of breath for the past 1 week.  The patient has a history of PE in the past and was recently taken off Coumadin 2 weeks ago.  The patient also has a history of right below- knee amputation and diabetes mellitus.  The patient was seen in urgent care 2 days ago for nasal drip, at that time, he was given p.o. antibiotics to take at home but the patient continued to have shortness of breath so he came to the hospital.  Denies any fever or chest pain. He has been having coughing, coughing up phlegm.  Denies any dysuria, urgency, or frequency of urination.  PAST MEDICAL HISTORY:  Significant for: 1. Hypertension. 2. Diabetes mellitus. 3. 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 4. Hyperlipidemia.  ALLERGIES:  NONE.  HOME MEDICATIONS:  The doses have not been confirmed. 1. Amlodipine daily. 2. Cefprozil (the patient was given the antibiotic by the urgent care     for the sore throat). 3. Ezetimibe. 4. Hydrochlorothiazide. 5. Lisinopril. 6. NovoLog. 7. Pravastatin.  REVIEW OF SYSTEMS:  HEENT:  There is no headache.  No blurred vision. No runny nose.  No sore throat.  NECK:  No history of thyroid problems. CHEST:  No history of COPD or emphysema.  HEART:  No history of CAD. GI:  No nausea, vomiting, or diarrhea.  GENITOURINARY:  No dysuria, urgency, or frequency of urination.  NEUROLOGIC:  No history of stroke or TIA in the  past.  HEMATOLOGIC:  The patient has a history of PE in the past, was on Coumadin for 2-1/2 years, was just taken off the Coumadin 2 weeks ago.  PHYSICAL EXAMINATION:  VITAL SIGNS:  The patient's blood pressure is 141/60, pulse 89, and respirations 24. HEENT:  Head is atraumatic and normocephalic.  Eyes, extraocular movements are intact.  Oral mucosa is pink and moist. NECK:  Supple. CHEST:  Clear to auscultation bilaterally. HEART:  S1 and S2.  Regular rate and rhythm. ABDOMEN:  Soft and nontender.  No organomegaly. EXTREMITIES: No cyanosis.  No clubbing.  No edema.  The patient is right below-knee amputation.  PERTINENT LABORATORY DATA:  WBC 12.8, hemoglobin 12.9, hematocrit 37.4, and platelets of 103,000.  Sodium 134, potassium 5.0, chloride is 101, CO2 of 24, BUN is 49, creatinine 2.13, and glucose 185.  BNP is 1354. The imaging studies done in the hospital shows chest x-ray showed low volume chest.  Left basilar density at the apex representing either atelectasis, respiratory disease, or pneumonia.  CT angio of the chest showed positive pulmonary emboli.  ASSESSMENT: 1.  Pulmonary embolism. 2. Diabetes mellitus. 3. Hypertension.  PLAN: 1. Pulmonary embolism.  The patient has a history of PE in the past,     has been started on heparin protocol.  We will obtain a     hypercoagulable panel, but at this time, the patient will need     Coumadin life long. 2. Hypertension.  The patient will be put on lisinopril and I am going     to hold the hydrochlorothiazide at this time because of renal     insufficiency.  The patient's home medication doses have to be     confirmed yet, and the medications will have to be changed once the     current doses are known. 3. Acute renal insufficiency.  The patient will be started on normal     saline 75 mL/per hour but the patient also has an elevated BNP so     we will have to be careful with the IV fluids.  If the patient     starts to  develop fluid overload, the IV fluids have to be stopped     and the patient may require Lasix.  The BNP elevation could also be     due to the acute pulmonary embolism.  Anyway, the 2D echo will be     obtained today. 4. Diabetes mellitus.  The patient wants to use the insulin pump which     he uses at home, which will be continued.     Mauro Kaufmann, MD     GL/MEDQ  D:  10/16/2010  T:  10/16/2010  Job:  [id number]  Electronically Signed by Sibyl Parr LAMA  on 11/05/2010 07:56:55 AM

## 2010-11-13 NOTE — Discharge Summary (Signed)
NAME:  Patrick Shelton, Patrick Shelton NO.:  [id number]  MEDICAL RECORD NO.:  [id number]  LOCATION:  1417                         FACILITY:  [facility]  PHYSICIAN:  Brendia Sacks, MD    DATE OF BIRTH:  01-24-1941  DATE OF ADMISSION:  10/16/2010 DATE OF DISCHARGE:  10/19/2010                        DISCHARGE SUMMARY - REFERRING   PRIMARY CARE PHYSICIAN:  At the [facility], Dr. Paula Compton, [phone].  PRIMARY CARDIOLOGIST:  Dr. Royann Shivers.  ENDOCRINOLOGIST:  Dr. Leslie Dales.  CONDITION ON DISCHARGE:  Improved.  DISPOSITION:  Home with home health, and oxygen at 3 L per minute nasal cannula and RN for PT, lab draws, and physical therapy.  DISCHARGE DIAGNOSES: 1. Acute bilateral pulmonary emboli without provocation. 2. Acute renal failure, resolved. 3. Elevated CK probably secondary to immobility. 4. Diabetes mellitus, stable.  HISTORY OF PRESENT ILLNESS:  This is a 70 year old man who had a history of PE several years ago.  He recently was taken off his Coumadin several weeks ago.  He developed shortness of breath over the last approximately 1 week or slightly more and spent quite a bit of time in bed and just felt quite poorly.  He was seen in Urgent Care couple of days prior to admission for nasal drip and was given antibiotics, but continued to feel poorly.  On the day of admission, he called EMS and was transported to the hospital.  He was noted to be hypoxic at that time.  Workup in the emergency room revealed bilateral PE.  HOSPITAL COURSE:  The patient was admitted to the medical floor in telemetry for further evaluation and treatment of acute bilateral pulmonary emboli.  He was placed on initially heparin and transitioned to Lovenox.  Continue on Lovenox and Coumadin in the outpatient setting. His goal INR will be 2 to 3 and he can discontinue his Lovenox once he has had a therapeutic INR for 2 days and a minimum of 5 days of therapy. The patient is mildly hypoxic without oxygen  therapy, so he will be discharged home on oxygen.  He has been able to move around the room without difficulty and is quite ready to go home.  He has had no chest pain and his breathing is stable.  It is not clear whether these emboli might have developed after prolonged immobility or the patient's immobility was a result of his general shortness of breath.  Regardless as this is a second episode of pulmonary emboli, we would recommend lifelong treatment.  On admission, the patient was also noted to have mild acute renal failure probably secondary of poor oral intake and prolonged immobility at home or poor mobility at home.  His renal failure resolved with IV fluids and withholding of lisinopril and diuretic therapy.  He was also noted to have a mild elevation of his CK- MB again likely secondary to relative immobility at home.  This is trending downwards.  He has had excellent urine output.  He will resume his diuretic therapy.  He will follow up with primary care physician in the near future and at that time, ACE inhibitor can probably be restarted.  His diabetes has been stable with a few lows here which he manages  with his insulin pump.  He is stable from this standpoint for discharge.  CONSULTATIONS:  None.  PROCEDURES:  None.  IMAGING: 1. Chest x-ray, August 9th, left basilar density at the apex,     atelectasis. 2. CT angiogram of the chest, August 9th:  Positive of pulmonary     emboli.  MICROBIOLOGY:  None.  ANCILLARY STUDIES: 1. A 2-D echocardiogram, August 10th:  Left ventricular ejection     fraction 50% to 55%.  Left ventricular diastolic function     parameters were noted to be normal.  Right ventricle size was noted     to be normal.  No comment made on function. 2. Bilateral lower extremity venous duplex.  No obvious evidence for     DVT or superficial thrombosis.  PERTINENT LABORATORY STUDIES: 1. Hypercoagulability panel noted to have multiple  abnormalities.     Defer interpretation to primary care physician. 2. CBC notable for mild leukocytosis of 15.2, otherwise unremarkable.     He has had no fever and this was presumably related to his     pulmonary embolism. 3. Discharge INR is 1.85, yesterday's was 1.13 and the patient     received 7.5 mg of Coumadin last night.  His goal INR is 2 to 3. 4. Capillary blood sugars, he was noted otherwise stable. 5. Basic metabolic panel notable for creatinine of 2.13 on admission     and 1.28 on discharge. 6. CK notable to be 1613, peaked at 1770 and on discharge is 960.  CK-     MB is mildly elevated initially at 13.6 trending down to 8.6     related to total CK.  Troponin within normal limits.  PHYSICAL EXAMINATION:  GENERAL:  On discharge, the patient is feeling well.  He has no pain.  His breathing is stable.  He is ready go home. VITAL SIGNS:  Temperature is 98.2, pulse 85, respirations 20, blood pressure 159/82, and saturation 93% on 3 to 4 L.  His oxygen saturation has been stable at 3 L and this will be the amount he was discharged on. CARDIOVASCULAR:  Regular rate and rhythm.  No murmur, rub, or gallop. Telemetry shows to be episode of PVC, otherwise unremarkable. RESPIRATORY:  Clear to auscultation bilaterally without wheezes, rales, or rhonchi.  DISCHARGE INSTRUCTIONS:  The patient be discharged home today.  His diet is a diabetic heart-healthy diet.  His activity is as tolerated.  He will be using primarily his wheelchair and scooter until he becomes more mobile again.  He should follow up with his primary care physician, Dr. Paula Compton at [facility] and with Dr. Royann Shivers in 1 to 2 weeks.  Dr. Royann Shivers managed his Coumadin and [facility] will call results to his office.  DISCHARGE MEDICATIONS NEW: 1. Enoxaparin 110 mg subcutaneously b.i.d.  The patient has received     Lovenox teaching and has been deemed competent by the nurse. 2. Fluticasone 50 mcg 2 sprays nasally  daily. 3. Warfarin 5 mg p.o. q.h.s.  Resume the same medications. 1. Amlodipine 10 mg p.o. q.h.s. 2. Hydrochlorothiazide 25 mg p.o. q.h.s. 3. Insulin pump as directed. 4. Proventil 2 puffs every 4 hours as needed for shortness of breath. 5. Zetia 10 mg p.o. q.h.s.  Discontinue the following medications at this time: 1. Lisinopril. 2. Pravastatin.  Things to follow up in the outpatient setting: 1. Acute bilateral pulmonary embolism.  Goal INR is 2 to 3.  He will     require lifelong treatment  at this point. 2. Mild elevation of CK and CK-MB.  See discussion above.  Could     consider resuming his statin in the outpatient setting. 3. Consider restarting his ACE inhibitor.  Time coordinating discharge, 35 minutes.     Brendia Sacks, MD     DG/MEDQ  D:  10/19/2010  T:  10/19/2010  Job:  [id number]  cc:   Dr. Paula Compton, [phone]  Thurmon Fair, MD Fax: [phone]  Veverly Fells. Altheimer, M.D. Fax: [phone]  Electronically Signed by Brendia Sacks  on 11/13/2010 09:37:28 PM

## 2010-12-12 LAB — DIFFERENTIAL
Basophils Absolute: 0 10*3/uL (ref 0.0–0.1)
Eosinophils Absolute: 0.2 10*3/uL (ref 0.0–0.7)
Eosinophils Relative: 2 % (ref 0–5)
Lymphocytes Relative: 16 % (ref 12–46)
Lymphocytes Relative: 18 % (ref 12–46)
Lymphs Abs: 2.6 10*3/uL (ref 0.7–4.0)
Monocytes Relative: 7 % (ref 3–12)
Neutro Abs: 13.1 10*3/uL — ABNORMAL HIGH (ref 1.7–7.7)
Neutrophils Relative %: 72 % (ref 43–77)
Neutrophils Relative %: 77 % (ref 43–77)

## 2010-12-12 LAB — BASIC METABOLIC PANEL
BUN: 17 mg/dL (ref 6–23)
BUN: 19 mg/dL (ref 6–23)
BUN: 21 mg/dL (ref 6–23)
CO2: 25 mEq/L (ref 19–32)
CO2: 27 mEq/L (ref 19–32)
Calcium: 8.9 mg/dL (ref 8.4–10.5)
Chloride: 104 mEq/L (ref 96–112)
Creatinine, Ser: 1.22 mg/dL (ref 0.4–1.5)
Creatinine, Ser: 1.24 mg/dL (ref 0.4–1.5)
Creatinine, Ser: 1.33 mg/dL (ref 0.4–1.5)
GFR calc Af Amer: >60 mL/min (ref >60)
GFR calc Af Amer: >60 mL/min (ref >60)
GFR calc non Af Amer: 54 mL/min — ABNORMAL LOW (ref >60)
GFR calc non Af Amer: 58 mL/min — ABNORMAL LOW (ref >60)
GFR calc non Af Amer: 59 mL/min — ABNORMAL LOW (ref >60)
Glucose, Bld: 130 mg/dL — ABNORMAL HIGH (ref 70–99)
Glucose, Bld: 220 mg/dL — ABNORMAL HIGH (ref 70–99)
Glucose, Bld: 223 mg/dL — ABNORMAL HIGH (ref 70–99)
Potassium: 4.7 mEq/L (ref 3.5–5.1)
Potassium: 4.8 mEq/L (ref 3.5–5.1)
Sodium: 137 mEq/L (ref 135–145)

## 2010-12-12 LAB — CULTURE, BLOOD (ROUTINE X 2)

## 2010-12-12 LAB — B-NATRIURETIC PEPTIDE (CONVERTED LAB): Pro B Natriuretic peptide (BNP): 251 pg/mL — ABNORMAL HIGH (ref 0.0–100.0)

## 2010-12-12 LAB — GLUCOSE, CAPILLARY
Glucose-Capillary: 100 mg/dL — ABNORMAL HIGH (ref 70–99)
Glucose-Capillary: 161 mg/dL — ABNORMAL HIGH (ref 70–99)
Glucose-Capillary: 181 mg/dL — ABNORMAL HIGH (ref 70–99)
Glucose-Capillary: 225 mg/dL — ABNORMAL HIGH (ref 70–99)
Glucose-Capillary: 226 mg/dL — ABNORMAL HIGH (ref 70–99)
Glucose-Capillary: 241 mg/dL — ABNORMAL HIGH (ref 70–99)
Glucose-Capillary: 296 mg/dL — ABNORMAL HIGH (ref 70–99)
Glucose-Capillary: 330 mg/dL — ABNORMAL HIGH (ref 70–99)
Glucose-Capillary: 349 mg/dL — ABNORMAL HIGH (ref 70–99)
Glucose-Capillary: 388 mg/dL — ABNORMAL HIGH (ref 70–99)
Glucose-Capillary: 83 mg/dL (ref 70–99)

## 2010-12-12 LAB — CBC
HCT: 27.7 % — ABNORMAL LOW (ref 39.0–52.0)
Hemoglobin: 9.6 g/dL — ABNORMAL LOW (ref 13.0–17.0)
MCHC: 32.7 g/dL (ref 30.0–36.0)
MCV: 86.7 fL (ref 78.0–100.0)
MCV: 87.2 fL (ref 78.0–100.0)
Platelets: 262 10*3/uL (ref 150–400)
Platelets: 276 10*3/uL (ref 150–400)
Platelets: 283 10*3/uL (ref 150–400)
Platelets: 313 10*3/uL (ref 150–400)
RBC: 3.24 MIL/uL — ABNORMAL LOW (ref 4.22–5.81)
RBC: 3.62 MIL/uL — ABNORMAL LOW (ref 4.22–5.81)
RDW: 13.3 % (ref 11.5–15.5)
RDW: 13.5 % (ref 11.5–15.5)
WBC: 14.2 10*3/uL — ABNORMAL HIGH (ref 4.0–10.5)
WBC: 16.9 10*3/uL — ABNORMAL HIGH (ref 4.0–10.5)

## 2010-12-12 LAB — WOUND CULTURE

## 2010-12-12 LAB — LIPID PANEL
Cholesterol: 139 mg/dL (ref 0–200)
HDL: 15 mg/dL — ABNORMAL LOW (ref >39)
Triglycerides: 118 mg/dL (ref <150)

## 2010-12-12 LAB — LACTIC ACID, PLASMA: Lactic Acid, Venous: 1.1 mmol/L (ref 0.5–2.2)

## 2010-12-12 LAB — HEMOGLOBIN A1C: Hgb A1c MFr Bld: 9.2 % — ABNORMAL HIGH (ref 4.6–6.1)

## 2010-12-12 LAB — VANCOMYCIN, TROUGH: Vancomycin Tr: 23 ug/mL — ABNORMAL HIGH (ref 10.0–20.0)

## 2011-07-23 DIAGNOSIS — E113599 Type 2 diabetes mellitus with proliferative diabetic retinopathy without macular edema, unspecified eye: Secondary | ICD-10-CM | POA: Insufficient documentation

## 2011-07-23 DIAGNOSIS — Z961 Presence of intraocular lens: Secondary | ICD-10-CM | POA: Insufficient documentation

## 2011-07-23 DIAGNOSIS — E11359 Type 2 diabetes mellitus with proliferative diabetic retinopathy without macular edema: Secondary | ICD-10-CM | POA: Insufficient documentation

## 2012-08-10 ENCOUNTER — Other Ambulatory Visit: Payer: Self-pay | Admitting: Pharmacist Clinician (PhC)/ Clinical Pharmacy Specialist

## 2012-08-10 MED ORDER — WARFARIN SODIUM 2.5 MG PO TABS: ORAL_TABLET | ORAL | Status: DC

## 2012-08-11 ENCOUNTER — Other Ambulatory Visit: Payer: Self-pay | Admitting: *Deleted

## 2012-08-11 DIAGNOSIS — I739 Peripheral vascular disease, unspecified: Secondary | ICD-10-CM

## 2012-08-15 ENCOUNTER — Ambulatory Visit (INDEPENDENT_AMBULATORY_CARE_PROVIDER_SITE_OTHER): Payer: Medicare Other | Admitting: Pharmacist Clinician (PhC)/ Clinical Pharmacy Specialist

## 2012-08-15 VITALS — BP 120/60 | HR 68 | Wt 260.8 lb

## 2012-08-15 DIAGNOSIS — Z8672 Personal history of thrombophlebitis: Secondary | ICD-10-CM

## 2012-08-15 DIAGNOSIS — Z7901 Long term (current) use of anticoagulants: Secondary | ICD-10-CM

## 2012-09-01 DIAGNOSIS — H2640 Unspecified secondary cataract: Secondary | ICD-10-CM | POA: Insufficient documentation

## 2012-09-12 ENCOUNTER — Ambulatory Visit: Payer: Medicare Other | Admitting: Pharmacist Clinician (PhC)/ Clinical Pharmacy Specialist

## 2012-09-14 ENCOUNTER — Encounter: Payer: Self-pay | Admitting: Cardiovascular Disease

## 2012-09-16 ENCOUNTER — Ambulatory Visit (INDEPENDENT_AMBULATORY_CARE_PROVIDER_SITE_OTHER): Payer: Medicare Other | Admitting: Pharmacist Clinician (PhC)/ Clinical Pharmacy Specialist

## 2012-09-16 VITALS — BP 138/70 | HR 88

## 2012-09-16 DIAGNOSIS — Z7901 Long term (current) use of anticoagulants: Secondary | ICD-10-CM

## 2012-09-16 DIAGNOSIS — Z8672 Personal history of thrombophlebitis: Secondary | ICD-10-CM

## 2012-09-22 ENCOUNTER — Encounter: Payer: Self-pay | Admitting: Vascular Surgery

## 2012-09-23 ENCOUNTER — Encounter: Payer: Self-pay | Admitting: Vascular Surgery

## 2012-09-23 ENCOUNTER — Encounter (INDEPENDENT_AMBULATORY_CARE_PROVIDER_SITE_OTHER): Payer: Medicare Other | Admitting: *Deleted

## 2012-09-23 ENCOUNTER — Ambulatory Visit (INDEPENDENT_AMBULATORY_CARE_PROVIDER_SITE_OTHER): Payer: Medicare Other | Admitting: Vascular Surgery

## 2012-09-23 VITALS — BP 135/75 | HR 67 | Resp 16 | Ht 71.0 in | Wt 260.0 lb

## 2012-09-23 DIAGNOSIS — I999 Unspecified disorder of circulatory system: Secondary | ICD-10-CM

## 2012-09-23 DIAGNOSIS — I739 Peripheral vascular disease, unspecified: Secondary | ICD-10-CM

## 2012-09-23 DIAGNOSIS — M7989 Other specified soft tissue disorders: Secondary | ICD-10-CM

## 2012-09-23 DIAGNOSIS — I70229 Atherosclerosis of native arteries of extremities with rest pain, unspecified extremity: Secondary | ICD-10-CM

## 2012-09-23 DIAGNOSIS — I998 Other disorder of circulatory system: Secondary | ICD-10-CM | POA: Insufficient documentation

## 2012-09-23 NOTE — Progress Notes (Signed)
[facility] [place]  Referred by:  Cleora Fleet, MD [address] [place], [place] [address]  Reason for referral: establish follow up  History of Present Illness  Patrick Shelton is a 72 y.o. (07-02-1940) male who presents with chief complaint: prior R BKA.  Pt recently transferred to [place], [place] for care.  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  He now ambulates with the help of a R leg prosthesis.  He denies any intermittent claudication or rest pain in L foot.  Atherosclerotic risk factors include: HTN, IDDM, hyperlipidemia, and history of smoking.  Past Medical History  Diagnosis Date  . Hypertension   . Diabetes mellitus   . PVD (peripheral vascular disease)   . S/P BKA (below knee amputation)   . Hyperlipidemia   . PVT (paroxysmal ventricular tachycardia)   . ED (erectile dysfunction)   . Chronic kidney disease   . Cancer     8 wks Radiation    Past Surgical History  Procedure Laterality Date  . Amputation Right 2009    BKA  . Prostate surgery      8 wks radiation    History   Social History  . Marital Status: Single    Spouse Name: N/A    Number of Children: N/A  . Years of Education: N/A   Occupational History  . Not on file.   Social History Main Topics  . Smoking status: Former Smoker    Quit date: 03/09/1977  . Smokeless tobacco: Never Used  . Alcohol Use: No  . Drug Use: No  . Sexually Active: Not on file   Other Topics Concern  . Not on file   Social History Narrative  . No narrative on file    Family History  Problem Relation Age of Onset  . Dementia Mother   . Diabetes Father     Amputation- Bilateral leg   Current Outpatient Prescriptions on File Prior to Visit  Medication Sig Dispense Refill  . albuterol (PROVENTIL,VENTOLIN) 90 MCG/ACT inhaler Inhale 2 puffs into the lungs every 4 (four) hours as needed.        . clindamycin (CLEOCIN T) 1 %  lotion Apply topically 2 (two) times daily.        Marland Kitchen ezetimibe (ZETIA) 10 MG tablet Take 10 mg by mouth daily.        . hydrochlorothiazide 25 MG tablet Take 25 mg by mouth daily.        Marland Kitchen lisinopril (PRINIVIL,ZESTRIL) 40 MG tablet Take 40 mg by mouth daily.        . pravastatin (PRAVACHOL) 40 MG tablet Take 40 mg by mouth daily.        Marland Kitchen warfarin (COUMADIN) 2.5 MG tablet Take 1 tablet by mouth daily or as directed by coumadin clinic  90 tablet  3  . amLODipine (NORVASC) 10 MG tablet Take 10 mg by mouth daily.        . Benzoyl Peroxide (BENZOYL PEROXIDE) 10 % LIQD Apply topically.         No current facility-administered medications on file prior to visit.    No Known Allergies   REVIEW OF SYSTEMS:  (Positives checked otherwise negative)  CARDIOVASCULAR:  []  chest pain, []  chest pressure, []  palpitations, []  shortness of breath when laying flat, []  shortness of breath with exertion,  []  pain in feet when walking, []  pain in feet when laying flat, [  x] history of blood clot in veins (DVT), []  history of phlebitis, []  swelling in legs, []  varicose veins  PULMONARY:  [x]  productive cough, []  asthma, []  wheezing  NEUROLOGIC:  []  weakness in arms or legs, []  numbness in arms or legs, []  difficulty speaking or slurred speech, []  temporary loss of vision in one eye, []  dizziness  HEMATOLOGIC:  []  bleeding problems, [x]  problems with blood clotting too easily  MUSCULOSKEL:  []  joint pain, []  joint swelling  GASTROINTEST:  []  vomiting blood, []  blood in stool     GENITOURINARY:  []  burning with urination, []  blood in urine  PSYCHIATRIC:  []  history of major depression  INTEGUMENTARY:  []  rashes, []  ulcers  CONSTITUTIONAL:  []  fever, []  chills  For VQI Use Only  PRE-ADM LIVING: Home  AMB STATUS: Ambulatory  CAD Sx: None  PRIOR CHF: None  STRESS TEST: [x]  No, [ ]  Normal, [ ]  + ischemia, [ ]  + MI, [ ]  Both  Physical Examination  Filed Vitals:   09/23/12 1321  BP: 135/75   Pulse: 67  Resp: 16  Height: 5\' 11"  (1.803 m)  Weight: 260 lb (117.935 kg)  SpO2: 97%   Body mass index is 36.28 kg/(m^2).  General: A&O x 3, WD, Obese,   Head: [place]/AT  Ear/Nose/Throat: Hearing grossly intact, nares w/o erythema or drainage, oropharynx w/o Erythema/Exudate, Mallampati score: 3  Eyes: PERRLA, EOMI, Post surg chg to pupils,   Neck: Supple, no nuchal rigidity, no palpable LAD  Pulmonary: Sym exp, good air movt, CTAB, no rales, rhonchi, & wheezing  Cardiac: RRR, Nl S1, S2, no Murmurs, rubs or gallops  Vascular: Vessel Right Left  Radial Palpable Palpable  Brachial Palpable Palpable  Carotid Palpable, without bruit Palpable, without bruit  Aorta Not palpable N/A  Femoral Palpable Palpable  Popliteal Not palpable Not palpable  PT BKA Palpable  DP BKA Palpable   Gastrointestinal: soft, NTND, -G/R, - HSM, - masses, - CVAT B, cannot palpate AAA due to obesity, insulin pump on abdomen   Musculoskeletal: M/S 5/5 throughout , Extremities without ischemic changes except R BKA  Neurologic: CN 2-12 intact , Pain and light touch intact in extremities , Motor exam as listed above  Psychiatric: Judgment intact, Mood & affect appropriate for pt's clinical situation  Dermatologic: See M/S exam for extremity exam, no rashes otherwise noted  Lymph : No Cervical, Axillary, or Inguinal lymphadenopathy   Non-Invasive Vascular Imaging  L ABI (Date: 09/23/2012)  L: [place], DP: tri, PT: tri, TBI: 0.148  Outside Studies/Documentation 1 pages of outside documents were reviewed including: Dr. Audrie Lia clinic note.  Medical Decision Making  Patrick Shelton is a 72 y.o. male who presents with: h/o RLE critical limb ischemia s/p BKA, asx LLE PAD with medial calcification   Based on exam and studies: I recommend q6 month LLE ABI to follow development of his PAD.  I discussed in depth with the patient the nature of atherosclerosis, and emphasized the importance of maximal medical  management including strict control of blood pressure, blood glucose, and lipid levels, antiplatelet agents, obtaining regular exercise, and cessation of smoking.  The patient is aware that without maximal medical management the underlying atherosclerotic disease process will progress, limiting the benefit of any interventions. The patient is currently on a statin:  Pravachol.   The patient is currently not on an anti-platelet as he is on Warfarin.  Thank you for allowing [place] to participate in this patient's care.  Leonides Sake, MD [facility] [place] Office: [phone] Pager: [phone]  09/23/2012, 1:59 PM

## 2012-10-07 ENCOUNTER — Other Ambulatory Visit: Payer: Self-pay | Admitting: *Deleted

## 2012-10-07 DIAGNOSIS — I739 Peripheral vascular disease, unspecified: Secondary | ICD-10-CM

## 2012-10-17 ENCOUNTER — Ambulatory Visit: Payer: Medicare Other | Admitting: Pharmacist Clinician (PhC)/ Clinical Pharmacy Specialist

## 2012-10-25 ENCOUNTER — Ambulatory Visit (INDEPENDENT_AMBULATORY_CARE_PROVIDER_SITE_OTHER): Payer: Medicare Other | Admitting: Pharmacist Clinician (PhC)/ Clinical Pharmacy Specialist

## 2012-10-25 VITALS — BP 110/60 | HR 92

## 2012-10-25 DIAGNOSIS — Z8672 Personal history of thrombophlebitis: Secondary | ICD-10-CM

## 2012-10-25 DIAGNOSIS — Z7901 Long term (current) use of anticoagulants: Secondary | ICD-10-CM

## 2012-10-25 LAB — POCT INR: INR: 2.2

## 2012-11-25 ENCOUNTER — Ambulatory Visit: Payer: Medicare Other | Admitting: Pharmacist Clinician (PhC)/ Clinical Pharmacy Specialist

## 2012-11-28 ENCOUNTER — Ambulatory Visit (INDEPENDENT_AMBULATORY_CARE_PROVIDER_SITE_OTHER): Payer: Medicare Other | Admitting: Pharmacist Clinician (PhC)/ Clinical Pharmacy Specialist

## 2012-11-28 VITALS — BP 138/64 | HR 84

## 2012-11-28 DIAGNOSIS — Z8672 Personal history of thrombophlebitis: Secondary | ICD-10-CM

## 2012-11-28 DIAGNOSIS — Z7901 Long term (current) use of anticoagulants: Secondary | ICD-10-CM

## 2012-11-30 ENCOUNTER — Telehealth: Payer: Self-pay

## 2012-11-30 DIAGNOSIS — M79609 Pain in unspecified limb: Secondary | ICD-10-CM

## 2012-11-30 DIAGNOSIS — M7989 Other specified soft tissue disorders: Secondary | ICD-10-CM

## 2012-11-30 DIAGNOSIS — I739 Peripheral vascular disease, unspecified: Secondary | ICD-10-CM

## 2012-11-30 NOTE — Telephone Encounter (Signed)
Phone call from pt. to report onset of decreased sensation in ball of left foot approx. 1 wk. ago.  Stated his left thigh feels thigh tight "like a tight fitting shoe", but unable to tell if there is swelling in the thigh.  States his left foot feels swollen.  States my left leg doesn't feel as strong as it did.  Reported that he didn't realize the amt. of sensation loss, until yesterday, when he went to apply the brake on his car, and realized he couldn't feel the brake pedal, to apply it fully.  Denies any rest pain or open sores on left lower extremity.   Discussed symptoms with Dr. Edilia Bo.  Recommends pt. be scheduled for ABI's and Left Lower Extremity Venous Duplex; recommends to schedule lab only; if abnormal then MD can see pt.  Notified pt. of Dr. Adele Dan recommendations.  Advised will have a scheduler call him with an appt.  Verb. Understanding.

## 2012-12-02 ENCOUNTER — Encounter: Payer: Self-pay | Admitting: Vascular Surgery

## 2012-12-02 ENCOUNTER — Ambulatory Visit (INDEPENDENT_AMBULATORY_CARE_PROVIDER_SITE_OTHER)
Admission: RE | Admit: 2012-12-02 | Discharge: 2012-12-02 | Disposition: A | Discharge status: Home / Self Care | Payer: Medicare Other | Source: Ambulatory Visit | Attending: Vascular Surgery | Admitting: Vascular Surgery

## 2012-12-02 ENCOUNTER — Ambulatory Visit (HOSPITAL_COMMUNITY)
Admission: RE | Admit: 2012-12-02 | Discharge: 2012-12-02 | Disposition: A | Discharge status: Home / Self Care | Payer: Medicare Other | Source: Ambulatory Visit | Attending: Vascular Surgery | Admitting: Vascular Surgery

## 2012-12-02 DIAGNOSIS — M7989 Other specified soft tissue disorders: Secondary | ICD-10-CM

## 2012-12-02 DIAGNOSIS — R609 Edema, unspecified: Secondary | ICD-10-CM

## 2012-12-02 DIAGNOSIS — M79609 Pain in unspecified limb: Secondary | ICD-10-CM

## 2012-12-02 DIAGNOSIS — I739 Peripheral vascular disease, unspecified: Secondary | ICD-10-CM

## 2012-12-07 ENCOUNTER — Other Ambulatory Visit: Payer: Self-pay | Admitting: *Deleted

## 2012-12-16 ENCOUNTER — Telehealth: Payer: Self-pay | Admitting: Cardiovascular Disease

## 2012-12-16 NOTE — Telephone Encounter (Signed)
Advised pt he can take acetaminophen 1gm bid for pain in hand/wrist (arthritis or neuropathy  -pt not sure), for stronger meds will need to contact PCP or endocrinologist

## 2012-12-16 NOTE — Telephone Encounter (Signed)
What can he take for pain  Please call

## 2012-12-22 ENCOUNTER — Encounter: Payer: Self-pay | Admitting: Vascular Surgery

## 2012-12-23 ENCOUNTER — Ambulatory Visit (INDEPENDENT_AMBULATORY_CARE_PROVIDER_SITE_OTHER): Payer: Medicare Other | Admitting: Vascular Surgery

## 2012-12-23 ENCOUNTER — Encounter: Payer: Self-pay | Admitting: Vascular Surgery

## 2012-12-23 VITALS — BP 138/61 | HR 71 | Ht 71.0 in | Wt 260.0 lb

## 2012-12-23 DIAGNOSIS — I70229 Atherosclerosis of native arteries of extremities with rest pain, unspecified extremity: Secondary | ICD-10-CM

## 2012-12-23 DIAGNOSIS — I999 Unspecified disorder of circulatory system: Secondary | ICD-10-CM

## 2012-12-23 DIAGNOSIS — I998 Other disorder of circulatory system: Secondary | ICD-10-CM

## 2012-12-23 NOTE — Progress Notes (Signed)
[facility] [place]  Established Critical Limb Ischemia Patient  History of Present Illness  Patrick Shelton is a 72 y.o. ([birth year]) male who presents with chief complaint: prior episode of L foot numbness in September.  The patient had difficulty feeling his Left foot brake pedal sudden while driving.  Dr. Edilia Bo had recommended a LLE venous duplex and BLE ABI.  The patient has no rest pain and no new wounds.  The patient notes symptoms have not progressed.  His sx include B foot and hand paraesthesia and anesthesia.  The patient's treatment regimen currently included: maximal medical management.  The patient's PMH, PSH, SH, FamHx, Med, and Allergies are unchanged from 09/23/12.  On ROS today: progressing peripheral neuropathy, left thigh cramping with buttock cramping  Physical Examination  Filed Vitals:   12/23/12 1539  BP: 138/61  Pulse: 71  Height: 5\' 11"  (1.803 m)  Weight: 260 lb (117.935 kg)  SpO2: 98%   Body mass index is 36.28 kg/(m^2).  General: A&O x 3, WD, Obese,   Pulmonary: Sym exp, good air movt, CTAB, no rales, rhonchi, & wheezing   Cardiac: RRR, Nl S1, S2, no Murmurs, rubs or gallops   Vascular:  Vessel  Right  Left   Radial  Palpable  Palpable   Brachial  Palpable  Palpable   Carotid  Palpable, without bruit  Palpable, without bruit   Aorta  Not palpable  N/A   Femoral  Palpable  Palpable   Popliteal  Not palpable  Not palpable   PT  BKA  Palpable   DP  BKA  Palpable    Gastrointestinal: soft, NTND, -G/R, - HSM, - masses, - CVAT B, cannot palpate AAA due to obesity, insulin pump on abdomen   Musculoskeletal: M/S 5/5 throughout , Extremities without ischemic changes except R BKA , some pitting in left calf  Neurologic: Pain and light touch intact in extremities except decreased sensation L foot > R foot, Motor exam as listed above   Non-Invasive Vascular Imaging  L ABI (Date: 12/23/2012)  L: [place], DP: bi, PT: bi, TBI:  0.47  Medical Decision Making  Patrick Shelton is a 72 y.o. male who presents with: s/p R BKA, LLE PAD, IDDM with complications including PAD   Based on the patient's vascular studies and examination, I have offered the patient: continued surveillance with q6 mon ABI.  I discussed in depth with the patient the nature of atherosclerosis, and emphasized the importance of maximal medical management including strict control of blood pressure, blood glucose, and lipid levels, antiplatelet agents, obtaining regular exercise, and cessation of smoking.    The patient is aware that without maximal medical management the underlying atherosclerotic disease process will progress, limiting the benefit of any interventions. The patient is currently on a statin: Pravastatin.  The patient is currently on Coumadin and not an anti-platelet.  Thank you for allowing [place] to participate in this patient's care.  Leonides Sake, MD [facility] [place] Office: [phone] Pager: [phone]  12/23/2012, 4:25 PM

## 2013-01-09 ENCOUNTER — Ambulatory Visit: Payer: Medicare Other | Admitting: Pharmacist Clinician (PhC)/ Clinical Pharmacy Specialist

## 2013-01-09 ENCOUNTER — Ambulatory Visit: Payer: Medicare Other | Admitting: Cardiovascular Disease

## 2013-01-23 ENCOUNTER — Encounter: Payer: Self-pay | Admitting: Cardiovascular Disease

## 2013-01-23 ENCOUNTER — Ambulatory Visit (INDEPENDENT_AMBULATORY_CARE_PROVIDER_SITE_OTHER): Payer: Medicare Other | Admitting: Cardiovascular Disease

## 2013-01-23 ENCOUNTER — Ambulatory Visit (INDEPENDENT_AMBULATORY_CARE_PROVIDER_SITE_OTHER): Payer: Medicare Other | Admitting: Pharmacist Clinician (PhC)/ Clinical Pharmacy Specialist

## 2013-01-23 VITALS — BP 130/77 | HR 69 | Ht 71.0 in | Wt 246.5 lb

## 2013-01-23 DIAGNOSIS — I10 Essential (primary) hypertension: Secondary | ICD-10-CM

## 2013-01-23 DIAGNOSIS — I2699 Other pulmonary embolism without acute cor pulmonale: Secondary | ICD-10-CM

## 2013-01-23 DIAGNOSIS — E785 Hyperlipidemia, unspecified: Secondary | ICD-10-CM

## 2013-01-23 DIAGNOSIS — Z7901 Long term (current) use of anticoagulants: Secondary | ICD-10-CM

## 2013-01-23 DIAGNOSIS — Z8672 Personal history of thrombophlebitis: Secondary | ICD-10-CM

## 2013-01-23 NOTE — Patient Instructions (Signed)
Your physician wants you to follow-up in: 1 year with Dr. Gae Dry will receive a reminder letter in the mail two months in advance. If you don't receive a letter, please call our office to schedule the follow-up appointment.

## 2013-01-29 DIAGNOSIS — Z86711 Personal history of pulmonary embolism: Secondary | ICD-10-CM | POA: Insufficient documentation

## 2013-01-29 DIAGNOSIS — E785 Hyperlipidemia, unspecified: Secondary | ICD-10-CM | POA: Insufficient documentation

## 2013-01-29 NOTE — Progress Notes (Signed)
Patient ID: Patrick Shelton, male   DOB: 04-Jun-1940, 72 y.o.   MRN: [id number]      Reason for office visit History of pulmonary embolism, hypertension, hyperlipidemia, diabetes mellitus, peripheral atrial disease  Patrick Shelton has a history of a recurrent pulmonary embolism, on lifelong warfarin anticoagulation. He has previously undergone below-the-knee amputation of his right leg for PAD. He has increased coronary calcium by CT angiography but does not have a history of symptomatic coronary disease and has never required cardiac catheterization. A nuclear stress test in 2011 was normal. He has low normal left ventricular systolic function estimated to be 50-55% by echocardiography. He has systemic hypertension and mild LVH and has long-standing insulin requiring diabetes mellitus. He is very sedentary. He denies chest pain but has occasional dyspnea on exertion. Diabetes control remains very difficult, with problems related to high and low readings.   No Known Allergies  Current Outpatient Prescriptions  Medication Sig Dispense Refill  . albuterol (PROVENTIL,VENTOLIN) 90 MCG/ACT inhaler Inhale 2 puffs into the lungs every 4 (four) hours as needed.        Marland Kitchen amLODipine (NORVASC) 10 MG tablet Take 10 mg by mouth daily.        . Cholecalciferol (VITAMIN D-3) 5000 UNITS TABS Take by mouth daily.      Marland Kitchen ezetimibe (ZETIA) 10 MG tablet Take 5 mg by mouth daily.       Marland Kitchen glucagon (GLUCAGON EMERGENCY) 1 MG injection Inject 1 mg into the vein once as needed.      . hydrochlorothiazide 25 MG tablet Take 25 mg by mouth daily.        . Insulin Human (INSULIN PUMP) 100 unit/ml SOLN Inject into the skin continuous. Max 155 U/day      . lisinopril (PRINIVIL,ZESTRIL) 40 MG tablet Take 40 mg by mouth daily.        . pravastatin (PRAVACHOL) 40 MG tablet Take 40 mg by mouth daily.        Marland Kitchen warfarin (COUMADIN) 2.5 MG tablet Take 1 tablet by mouth daily or as directed by coumadin clinic  90 tablet  3   No current  facility-administered medications for this visit.    Past Medical History  Diagnosis Date  . Hypertension   . Diabetes mellitus   . PVD (peripheral vascular disease)   . S/P BKA (below knee amputation)   . Hyperlipidemia   . PVT (paroxysmal ventricular tachycardia)   . ED (erectile dysfunction)   . Chronic kidney disease   . Cancer     8 wks Radiation    Past Surgical History  Procedure Laterality Date  . Amputation Right 2009    BKA  . Prostate surgery      8 wks radiation    Family History  Problem Relation Age of Onset  . Dementia Mother   . Diabetes Father     Amputation- Bilateral leg    History   Social History  . Marital Status: Single    Spouse Name: N/A    Number of Children: N/A  . Years of Education: N/A   Occupational History  . Not on file.   Social History Main Topics  . Smoking status: Former Smoker    Quit date: 03/09/1977  . Smokeless tobacco: Never Used  . Alcohol Use: No  . Drug Use: No  . Sexual Activity: Not on file   Other Topics Concern  . Not on file   Social History Narrative  . No narrative on file  Review of systems: The patient specifically denies any chest pain at rest or with exertion, dyspnea at rest, orthopnea, paroxysmal nocturnal dyspnea, syncope, palpitations, focal neurological deficits, intermittent claudication, lower extremity edema, unexplained weight gain, cough, hemoptysis or wheezing.  The patient also denies abdominal pain, nausea, vomiting, dysphagia, diarrhea, constipation, polyuria, polydipsia, dysuria, hematuria, frequency, urgency, abnormal bleeding or bruising, fever, chills, unexpected weight changes, mood swings, change in skin or hair texture, change in voice quality, auditory or visual problems, allergic reactions or rashes, new musculoskeletal complaints other than usual "aches and pains".   PHYSICAL EXAM BP 130/77  Pulse 69  Ht 5\' 11"  (1.803 m)  Wt 246 lb 8 oz (111.812 kg)  BMI 34.40  kg/m2  General: Alert, oriented x3, no distress Head: no evidence of trauma, PERRL, EOMI, no exophtalmos or lid lag, no myxedema, no xanthelasma; normal ears, nose and oropharynx Neck: normal jugular venous pulsations and no hepatojugular reflux; brisk carotid pulses without delay and no carotid bruits Chest: clear to auscultation, no signs of consolidation by percussion or palpation, normal fremitus, symmetrical and full respiratory excursions Cardiovascular: normal position and quality of the apical impulse, regular rhythm, normal first and second heart sounds, no murmurs, rubs or gallops Abdomen: no tenderness or distention, no masses by palpation, no abnormal pulsatility or arterial bruits, normal bowel sounds, no hepatosplenomegaly Extremities: Status post right below the knee amputation; on the left,  no clubbing, cyanosis or edema; 2+ radial, ulnar and brachial pulses bilaterally; 2+ left femoral, posterior tibial and dorsalis pedis pulses; no subclavian or femoral bruits Neurological: grossly nonfocal   EKG: Sinus rhythm, very poor R-wave progression consistent, new nonspecific IVCD, otherwise unchanged from pre-existing tracings  Lipid Panel     Component Value Date/Time   CHOL 156 05/24/2010 0134   TRIG 250* 05/24/2010 0134   HDL 28* 05/24/2010 0134   CHOLHDL 5.6 Ratio 05/24/2010 0134   VLDL 50* 05/24/2010 0134   LDLCALC 78 05/24/2010 0134    BMET    Component Value Date/Time   NA 139 10/19/2010 0540   K 4.4 10/19/2010 0540   CL 106 10/19/2010 0540   CO2 23 10/19/2010 0540   GLUCOSE 69* 10/19/2010 0540   BUN 29* 10/19/2010 0540   CREATININE 1.28 10/19/2010 0540   CALCIUM 9.4 10/19/2010 0540   GFRNONAA 56* 10/19/2010 0540   GFRAA >60 10/19/2010 0540     ASSESSMENT AND PLAN Pulmonary embolism, recurrent Continue lifelong anticoagulation. Any interruption in therapy should be handled with heparin "bridging".  Despite multiple coronary risk factors he does not have any symptoms to  suggest coronary insufficiency and had a normal nuclear stress test in 2011. One of the biggest problems at this time remains diabetes control due to his very volatile blood sugar. He seems to behave more like a type I diabetic  Patient Instructions  Your physician wants you to follow-up in: 1 year with Dr. Gae Dry will receive a reminder letter in the mail two months in advance. If you don't receive a letter, please call our office to schedule the follow-up appointment.    Meds ordered this encounter  Medications  . Insulin Human (INSULIN PUMP) 100 unit/ml SOLN    Sig: Inject into the skin continuous. Max 155 U/day  . glucagon (GLUCAGON EMERGENCY) 1 MG injection    Sig: Inject 1 mg into the vein once as needed.  . Cholecalciferol (VITAMIN D-3) 5000 UNITS TABS    Sig: Take by mouth daily.    [facility]  Arley Salamone, MD, [facility] [phone] office [phone] pager

## 2013-01-29 NOTE — Assessment & Plan Note (Signed)
Cholesterol levels are excellent. Elevated triglycerides reflect his glycemic control.

## 2013-01-29 NOTE — Assessment & Plan Note (Signed)
Continue lifelong anticoagulation. Any interruption in therapy should be handled with heparin "bridging".

## 2013-01-31 ENCOUNTER — Encounter: Payer: Self-pay | Admitting: Cardiovascular Disease

## 2013-03-06 ENCOUNTER — Ambulatory Visit: Payer: Medicare Other | Admitting: Pharmacist Clinician (PhC)/ Clinical Pharmacy Specialist

## 2013-03-07 ENCOUNTER — Ambulatory Visit (INDEPENDENT_AMBULATORY_CARE_PROVIDER_SITE_OTHER): Payer: Medicare Other | Admitting: Pharmacist Clinician (PhC)/ Clinical Pharmacy Specialist

## 2013-03-07 VITALS — BP 144/62 | HR 84

## 2013-03-07 DIAGNOSIS — Z5181 Encounter for therapeutic drug level monitoring: Secondary | ICD-10-CM

## 2013-03-07 DIAGNOSIS — Z8672 Personal history of thrombophlebitis: Secondary | ICD-10-CM

## 2013-03-07 DIAGNOSIS — Z7901 Long term (current) use of anticoagulants: Secondary | ICD-10-CM

## 2013-03-07 LAB — POCT INR: INR: 2

## 2013-03-30 ENCOUNTER — Encounter: Payer: Self-pay | Admitting: Family

## 2013-03-31 ENCOUNTER — Ambulatory Visit: Payer: Medicare Other | Admitting: Family

## 2013-03-31 ENCOUNTER — Ambulatory Visit (INDEPENDENT_AMBULATORY_CARE_PROVIDER_SITE_OTHER): Payer: Medicare Other | Admitting: Family

## 2013-03-31 ENCOUNTER — Encounter: Payer: Self-pay | Admitting: Family

## 2013-03-31 ENCOUNTER — Encounter (HOSPITAL_COMMUNITY): Payer: Medicare Other

## 2013-03-31 ENCOUNTER — Ambulatory Visit (HOSPITAL_COMMUNITY)
Admission: RE | Admit: 2013-03-31 | Discharge: 2013-03-31 | Disposition: A | Discharge status: Home / Self Care | Payer: Medicare Other | Source: Ambulatory Visit | Attending: Family | Admitting: Family

## 2013-03-31 VITALS — BP 124/77 | HR 73 | Resp 16 | Ht 70.5 in | Wt 260.0 lb

## 2013-03-31 DIAGNOSIS — I998 Other disorder of circulatory system: Secondary | ICD-10-CM

## 2013-03-31 DIAGNOSIS — E119 Type 2 diabetes mellitus without complications: Secondary | ICD-10-CM | POA: Insufficient documentation

## 2013-03-31 DIAGNOSIS — I10 Essential (primary) hypertension: Secondary | ICD-10-CM | POA: Insufficient documentation

## 2013-03-31 DIAGNOSIS — E785 Hyperlipidemia, unspecified: Secondary | ICD-10-CM | POA: Insufficient documentation

## 2013-03-31 DIAGNOSIS — I70229 Atherosclerosis of native arteries of extremities with rest pain, unspecified extremity: Secondary | ICD-10-CM

## 2013-03-31 DIAGNOSIS — I739 Peripheral vascular disease, unspecified: Secondary | ICD-10-CM

## 2013-03-31 DIAGNOSIS — S88119A Complete traumatic amputation at level between knee and ankle, unspecified lower leg, initial encounter: Secondary | ICD-10-CM | POA: Insufficient documentation

## 2013-03-31 DIAGNOSIS — Z87891 Personal history of nicotine dependence: Secondary | ICD-10-CM | POA: Insufficient documentation

## 2013-03-31 DIAGNOSIS — I999 Unspecified disorder of circulatory system: Secondary | ICD-10-CM

## 2013-03-31 DIAGNOSIS — Z48812 Encounter for surgical aftercare following surgery on the circulatory system: Secondary | ICD-10-CM

## 2013-03-31 NOTE — Patient Instructions (Addendum)
Peripheral Vascular Disease Peripheral Vascular Disease (PVD), also called Peripheral Arterial Disease (PAD), is a circulation problem caused by cholesterol (atherosclerotic plaque) deposits in the arteries. PVD commonly occurs in the lower extremities (legs) but it can occur in other areas of the body, such as your arms. The cholesterol buildup in the arteries reduces blood flow which can cause pain and other serious problems. The presence of PVD can place a person at risk for Coronary Artery Disease (CAD).  CAUSES  Causes of PVD can be many. It is usually associated with more than one risk factor such as:   High Cholesterol.  Smoking.  Diabetes.  Lack of exercise or inactivity.  High blood pressure (hypertension).  Obesity.  Family history. SYMPTOMS   When the lower extremities are affected, patients with PVD may experience:  Leg pain with exertion or physical activity. This is called INTERMITTENT CLAUDICATION. This may present as cramping or numbness with physical activity. The location of the pain is associated with the level of blockage. For example, blockage at the abdominal level (distal abdominal aorta) may result in buttock or hip pain. Lower leg arterial blockage may result in calf pain.  As PVD becomes more severe, pain can develop with less physical activity.  In people with severe PVD, leg pain may occur at rest.  Other PVD signs and symptoms:  Leg numbness or weakness.  Coldness in the affected leg or foot, especially when compared to the other leg.  A change in leg color.  Patients with significant PVD are more prone to ulcers or sores on toes, feet or legs. These may take longer to heal or may reoccur. The ulcers or sores can become infected.  If signs and symptoms of PVD are ignored, gangrene may occur. This can result in the loss of toes or loss of an entire limb.  Not all leg pain is related to PVD. Other medical conditions can cause leg pain such  as:  Blood clots (embolism) or Deep Vein Thrombosis.  Inflammation of the blood vessels (vasculitis).  Spinal stenosis. DIAGNOSIS  Diagnosis of PVD can involve several different types of tests. These can include:  Pulse Volume Recording Method (PVR). This test is simple, painless and does not involve the use of X-rays. PVR involves measuring and comparing the blood pressure in the arms and legs. An ABI (Ankle-Brachial Index) is calculated. The normal ratio of blood pressures is 1. As this number becomes smaller, it indicates more severe disease.  < 0.95  indicates significant narrowing in one or more leg vessels.  <0.8 there will usually be pain in the foot, leg or buttock with exercise.  <0.4 will usually have pain in the legs at rest.  <0.25  usually indicates limb threatening PVD.  Doppler detection of pulses in the legs. This test is painless and checks to see if you have a pulses in your legs/feet.  A dye or contrast material (a substance that highlights the blood vessels so they show up on x-ray) may be given to help your caregiver better see the arteries for the following tests. The dye is eliminated from your body by the kidney's. Your caregiver may order blood work to check your kidney function and other laboratory values before the following tests are performed:  Magnetic Resonance Angiography (MRA). An MRA is a picture study of the blood vessels and arteries. The MRA machine uses a large magnet to produce images of the blood vessels.  Computed Tomography Angiography (CTA). A CTA is a   specialized x-ray that looks at how the blood flows in your blood vessels. An IV may be inserted into your arm so contrast dye can be injected.  Angiogram. Is a procedure that uses x-rays to look at your blood vessels. This procedure is minimally invasive, meaning a small incision (cut) is made in your groin. A small tube (catheter) is then inserted into the artery of your groin. The catheter is  guided to the blood vessel or artery your caregiver wants to examine. Contrast dye is injected into the catheter. X-rays are then taken of the blood vessel or artery. After the images are obtained, the catheter is taken out. TREATMENT  Treatment of PVD involves many interventions which may include:  Lifestyle changes:  Quitting smoking.  Exercise.  Following a low fat, low cholesterol diet.  Control of diabetes.  Foot care is very important to the PVD patient. Good foot care can help prevent infection.  Medication:  Cholesterol-lowering medicine.  Blood pressure medicine.  Anti-platelet drugs.  Certain medicines may reduce symptoms of Intermittent Claudication.  Interventional/Surgical options:  Angioplasty. An Angioplasty is a procedure that inflates a balloon in the blocked artery. This opens the blocked artery to improve blood flow.  Stent Implant. A wire mesh tube (stent) is placed in the artery. The stent expands and stays in place, allowing the artery to remain open.  Peripheral Bypass Surgery. This is a surgical procedure that reroutes the blood around a blocked artery to help improve blood flow. This type of procedure may be performed if Angioplasty or stent implants are not an option. SEEK IMMEDIATE MEDICAL CARE IF:   You develop pain or numbness in your arms or legs.  Your arm or leg turns cold, becomes blue in color.  You develop redness, warmth, swelling and pain in your arms or legs. MAKE SURE YOU:   Understand these instructions.  Will watch your condition.  Will get help right away if you are not doing well or get worse. Document Released: 04/02/2004 Document Revised: 05/18/2011 Document Reviewed: 02/28/2008 [employer] Patient Information 2014 [employer].  

## 2013-03-31 NOTE — Progress Notes (Signed)
[facility] HISTORY AND PHYSICAL -PAD  History of Present Illness Patrick Shelton is a 73 y.o. male patient that Dr. Bridgett Larsson has been following for PAD, he returns today for follow up. His sx include B foot and hand paraesthesia and anesthesia. The patient's treatment regimen currently included: maximal medical management. Pt denies claudication symptoms in LLE, is not driving in the last 2 months since he had difficulty feeling his left foot brake pedal while driving., is trying to get his Lucianne Lei fitted for hand control of brake and accelerator. Pt denies non-healing wounds. Pt denies history of stroke or TIA. 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  He is wearing right wrist Velcro support for gout pain in hand/wrist.  Pt denies New Medical or Surgical History. He has a treadmill at home but is not using it, states he will start to use it. Pt admits to lacking enough motivation to better control his DM.  Pt Diabetic: Yes, states uncontrolled, he uses an insulin pump and will be starting what sounds like weekly Bydureon. Pt smoker: former smoker, quit in 1979  Pt meds include: Statin :Yes ASA: No Other anticoagulants/antiplatelets: coumadin  Past Medical History  Diagnosis Date  . Hypertension   . Diabetes mellitus   . PVD (peripheral vascular disease)   . S/P BKA (below knee amputation)   . Hyperlipidemia   . PVT (paroxysmal ventricular tachycardia)   . ED (erectile dysfunction)   . Chronic kidney disease   . Cancer     8 wks Radiation    Social History History  Substance Use Topics  . Smoking status: Former Smoker    Quit date: 03/09/1977  . Smokeless tobacco: Never Used  . Alcohol Use: No    Family History Family History  Problem Relation Age of Onset  . Dementia Mother   . Diabetes Father     Amputation-  Bilateral leg    Past Surgical History  Procedure Laterality Date  . Amputation Right 2009    BKA  . Prostate surgery      8 wks radiation    No Known Allergies  Current Outpatient Prescriptions  Medication Sig Dispense Refill  . amLODipine (NORVASC) 10 MG tablet Take 10 mg by mouth daily.        . Cholecalciferol (VITAMIN D-3) 5000 UNITS TABS Take by mouth daily.      Marland Kitchen ezetimibe (ZETIA) 10 MG tablet Take 5 mg by mouth daily.       Marland Kitchen glucagon (GLUCAGON EMERGENCY) 1 MG injection Inject 1 mg into the vein once as needed.      . hydrochlorothiazide 25 MG tablet Take 25 mg by mouth daily.        . Insulin Human (INSULIN PUMP) 100 unit/ml SOLN Inject into the skin continuous. Max 155 U/day      . lisinopril (PRINIVIL,ZESTRIL) 40 MG tablet Take 40 mg by mouth daily.        . pravastatin (PRAVACHOL) 40 MG tablet Take 40 mg by mouth daily.        Marland Kitchen warfarin (COUMADIN) 2.5 MG tablet Take 1 tablet by mouth daily or as directed by coumadin clinic  90 tablet  3  . albuterol (PROVENTIL,VENTOLIN) 90 MCG/ACT inhaler Inhale 2 puffs into the lungs every 4 (four) hours as needed.  No current facility-administered medications for this visit.    ROS: See HPI for pertinent positives and negatives.   Physical Examination  Filed Vitals:   03/31/13 1543  BP: 124/77  Pulse: 73  Resp: 16   Filed Weights   03/31/13 1543  Weight: 260 lb (117.935 kg)   Body mass index is 36.77 kg/(m^2).  General: A&O x 3, WDWN, obese. Gait: slow and deliberate, using seated rolling walker Eyes: Pupils equal Pulmonary: CTAB, without wheezes , rales or rhonchi Cardiac: regular Rythm , without murmur         Carotid Bruits Left Right   Negative Negative   Radial pulses: 1+ palpable bilaterally                           VASCULAR EXAM: Extremities without ischemic changes  without Gangrene; without open wounds. Wearing prosthesis on right BKA.                                                                                                           LE Pulses LEFT RIGHT       POPLITEAL  not palpable   not palpable       POSTERIOR TIBIAL  not palpable   BKA        DORSALIS PEDIS      ANTERIOR TIBIAL not palpable  BKA    Abdomen: soft, NT, no masses. Skin: no rashes, no ulcers noted. Musculoskeletal: no muscle wasting or atrophy, wearing right BKA prosthesis.  Neurologic: A&O X 3; Appropriate Affect ; SENSATION: normal; MOTOR FUNCTION:  moving all extremities equally, motor strength 5/5 in UE's, 4/5 in LE's. Speech is fluent/normal. CN 2-12 grossly intact.  Non-Invasive Vascular Imaging: DATE: 03/31/2013 ABI: RIGHT BKA;  LEFT 1.08 in posterior tibial artery, anterior tibial artery is non-compressible due to calcification, Waveforms: triphasic Previous (12/02/12) ABI's: Left: non-compressible.  ASSESSMENT: Patrick Shelton is a 73 y.o. male who presents s/p right BKA and PAD in the setting of uncontrolled DM, controlled hypertension, dyslipidemia, and obesity, all under maximal medical management. His left leg ABI is WNL using the posterior tibial artery for Doppler pressure measured, anterior tibial (DP) is non-compressible due to calcification of the artery. No signs of ischemia in left foot.  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   PLAN:  I discussed in depth with the patient the nature of atherosclerosis, and emphasized the importance of maximal medical management including strict control of blood pressure, blood glucose, and lipid levels, obtaining regular exercise, and continued cessation of smoking.  The patient is aware that without maximal medical management the underlying atherosclerotic disease process will progress, limiting the benefit of any interventions. Based on the patient's vascular studies and examination, pt will return to clinic in 6 months for ABI's.   The patient  was given information about PAD including signs, symptoms, treatment, what symptoms should prompt the patient to seek immediate  medical care, and risk reduction measures to take.  Clemon Chambers, RN, MSN, FNP-C [facility] Phone: [phone]  Clinic MD: Bridgett Larsson  03/31/2013 4:07 PM

## 2013-04-03 NOTE — Addendum Note (Signed)
Addended by: Dorthula Rue L on: 04/03/2013 10:52 AM   Modules accepted: Orders

## 2013-04-20 ENCOUNTER — Ambulatory Visit (INDEPENDENT_AMBULATORY_CARE_PROVIDER_SITE_OTHER): Payer: Medicare Other | Admitting: Pharmacist Clinician (PhC)/ Clinical Pharmacy Specialist

## 2013-04-20 VITALS — BP 130/54 | HR 76

## 2013-04-20 DIAGNOSIS — Z7901 Long term (current) use of anticoagulants: Secondary | ICD-10-CM

## 2013-04-20 DIAGNOSIS — Z8672 Personal history of thrombophlebitis: Secondary | ICD-10-CM

## 2013-04-20 LAB — POCT INR

## 2013-06-05 ENCOUNTER — Ambulatory Visit (INDEPENDENT_AMBULATORY_CARE_PROVIDER_SITE_OTHER): Payer: Medicare Other | Admitting: Pharmacist Clinician (PhC)/ Clinical Pharmacy Specialist

## 2013-06-05 VITALS — BP 128/52 | HR 84

## 2013-06-05 DIAGNOSIS — Z8672 Personal history of thrombophlebitis: Secondary | ICD-10-CM

## 2013-06-05 DIAGNOSIS — Z7901 Long term (current) use of anticoagulants: Secondary | ICD-10-CM

## 2013-06-05 LAB — POCT INR: INR: 2

## 2013-06-29 ENCOUNTER — Other Ambulatory Visit: Payer: Self-pay | Admitting: Pharmacist Clinician (PhC)/ Clinical Pharmacy Specialist

## 2013-07-17 ENCOUNTER — Ambulatory Visit (INDEPENDENT_AMBULATORY_CARE_PROVIDER_SITE_OTHER): Payer: Medicare Other | Admitting: Pharmacist Clinician (PhC)/ Clinical Pharmacy Specialist

## 2013-07-17 DIAGNOSIS — Z7901 Long term (current) use of anticoagulants: Secondary | ICD-10-CM

## 2013-07-17 DIAGNOSIS — Z8672 Personal history of thrombophlebitis: Secondary | ICD-10-CM

## 2013-07-17 LAB — POCT INR: INR: 2.4

## 2013-08-21 ENCOUNTER — Ambulatory Visit (INDEPENDENT_AMBULATORY_CARE_PROVIDER_SITE_OTHER): Payer: Medicare Other | Admitting: Pharmacist Clinician (PhC)/ Clinical Pharmacy Specialist

## 2013-08-21 DIAGNOSIS — Z7901 Long term (current) use of anticoagulants: Secondary | ICD-10-CM

## 2013-08-21 DIAGNOSIS — Z8672 Personal history of thrombophlebitis: Secondary | ICD-10-CM

## 2013-08-21 LAB — POCT INR: INR: 1.9

## 2013-08-28 ENCOUNTER — Ambulatory Visit: Payer: Medicare Other | Admitting: Pharmacist Clinician (PhC)/ Clinical Pharmacy Specialist

## 2013-09-28 ENCOUNTER — Encounter: Payer: Self-pay | Admitting: Family

## 2013-09-29 ENCOUNTER — Encounter (HOSPITAL_COMMUNITY): Payer: Medicare Other

## 2013-09-29 ENCOUNTER — Ambulatory Visit: Payer: Medicare Other | Admitting: Family

## 2013-10-02 ENCOUNTER — Ambulatory Visit: Payer: Medicare Other | Admitting: Pharmacist Clinician (PhC)/ Clinical Pharmacy Specialist

## 2013-10-06 ENCOUNTER — Ambulatory Visit (INDEPENDENT_AMBULATORY_CARE_PROVIDER_SITE_OTHER): Payer: Medicare Other | Admitting: Pharmacist Clinician (PhC)/ Clinical Pharmacy Specialist

## 2013-10-06 DIAGNOSIS — Z8672 Personal history of thrombophlebitis: Secondary | ICD-10-CM

## 2013-10-06 DIAGNOSIS — Z7901 Long term (current) use of anticoagulants: Secondary | ICD-10-CM

## 2013-11-07 DIAGNOSIS — M1990 Unspecified osteoarthritis, unspecified site: Secondary | ICD-10-CM

## 2013-11-07 HISTORY — DX: Unspecified osteoarthritis, unspecified site: M19.90

## 2013-11-17 ENCOUNTER — Ambulatory Visit (INDEPENDENT_AMBULATORY_CARE_PROVIDER_SITE_OTHER): Payer: Medicare Other | Admitting: Pharmacist Clinician (PhC)/ Clinical Pharmacy Specialist

## 2013-11-17 DIAGNOSIS — Z7901 Long term (current) use of anticoagulants: Secondary | ICD-10-CM

## 2013-11-17 DIAGNOSIS — Z8672 Personal history of thrombophlebitis: Secondary | ICD-10-CM

## 2013-11-17 LAB — POCT INR: INR: 2.8

## 2013-11-22 ENCOUNTER — Encounter: Payer: Self-pay | Admitting: Family

## 2013-11-23 ENCOUNTER — Ambulatory Visit (INDEPENDENT_AMBULATORY_CARE_PROVIDER_SITE_OTHER): Payer: Medicare Other | Admitting: Family

## 2013-11-23 ENCOUNTER — Ambulatory Visit (HOSPITAL_COMMUNITY)
Admission: RE | Admit: 2013-11-23 | Discharge: 2013-11-23 | Disposition: A | Discharge status: Home / Self Care | Payer: Medicare Other | Source: Ambulatory Visit | Attending: Family | Admitting: Family

## 2013-11-23 ENCOUNTER — Encounter: Payer: Self-pay | Admitting: Family

## 2013-11-23 VITALS — BP 139/75 | HR 75 | Resp 16 | Ht 71.0 in | Wt 260.0 lb

## 2013-11-23 DIAGNOSIS — E119 Type 2 diabetes mellitus without complications: Secondary | ICD-10-CM | POA: Insufficient documentation

## 2013-11-23 DIAGNOSIS — Z48812 Encounter for surgical aftercare following surgery on the circulatory system: Secondary | ICD-10-CM

## 2013-11-23 DIAGNOSIS — Z87891 Personal history of nicotine dependence: Secondary | ICD-10-CM | POA: Diagnosis not present

## 2013-11-23 DIAGNOSIS — I739 Peripheral vascular disease, unspecified: Secondary | ICD-10-CM | POA: Diagnosis present

## 2013-11-23 DIAGNOSIS — I70229 Atherosclerosis of native arteries of extremities with rest pain, unspecified extremity: Secondary | ICD-10-CM

## 2013-11-23 DIAGNOSIS — I10 Essential (primary) hypertension: Secondary | ICD-10-CM | POA: Diagnosis not present

## 2013-11-23 DIAGNOSIS — E785 Hyperlipidemia, unspecified: Secondary | ICD-10-CM | POA: Insufficient documentation

## 2013-11-23 DIAGNOSIS — I998 Other disorder of circulatory system: Secondary | ICD-10-CM

## 2013-11-23 DIAGNOSIS — I999 Unspecified disorder of circulatory system: Secondary | ICD-10-CM

## 2013-11-23 NOTE — Patient Instructions (Signed)

## 2013-11-23 NOTE — Progress Notes (Signed)
[facility] [place] HISTORY AND PHYSICAL -PAD  History of Present Illness Patrick Shelton is a 73 y.o. male patient that Dr. Bridgett Larsson has been following for PAD, he returns today for follow up.  His sx include B hand and left foot paraesthesia and anesthesia. The patient's treatment regimen currently included: maximal medical management.  Pt denies claudication symptoms in LLE,  he had difficulty feeling his left foot brake pedal while driving, his Lucianne Lei is fitted for hand control of brake and accelerator.  Pt denies non-healing wounds.  Pt denies history of stroke or TIA.  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   Pt reports seeing his PCP since he was here last, has gout in both hands. 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  He has a treadmill at home but is not using it, states he is waiting to get another right BKA prosthesis fitting. Pt admits to lacking enough motivation to better control his DM, is tired, no energy. He states that he tries not to sleep due to bad dreams, states he has PTSD, and has thought about getting help from the [employer]; I encouraged him to follow up on this.  Pt Diabetic: Yes, states uncontrolled, he uses an insulin pump and is taking weekly DM injection.  Pt smoker: former smoker, quit in 1979   Pt meds include:  Statin :Yes  ASA: No  Other anticoagulants/antiplatelets: coumadin    Past Medical History  Diagnosis Date  . Hypertension   . Diabetes mellitus   . PVD (peripheral vascular disease)   . S/P BKA (below knee amputation)   . Hyperlipidemia   . PVT (paroxysmal ventricular tachycardia)   . ED (erectile dysfunction)   . Chronic kidney disease   . Cancer     8 wks Radiation  . Hx of agent  Orange exposure     while [place] in [place] during that conflict  . 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 Arthritis Sept. 2015    Gout-Right Hand    Social History History  Substance Use Topics  . Smoking status: Former Smoker    Quit date: 03/09/1977  . Smokeless tobacco: Never Used  . Alcohol Use: No    Family History Family History  Problem Relation Age of Onset  . Dementia Mother   . Diabetes Father     Amputation- Bilateral leg  . Peripheral vascular disease Father     Past Surgical History  Procedure Laterality Date  . Amputation Right 2009    BKA  . Prostate surgery      8 wks radiation    Allergies  Allergen Reactions  . Prednisone Anaphylaxis and Shortness Of Breath    Current Outpatient Prescriptions  Medication Sig Dispense Refill  . amLODipine (NORVASC) 10 MG tablet Take 10 mg by mouth daily.        . Cholecalciferol (VITAMIN D-3) 5000 UNITS TABS Take by mouth daily.      . Dulaglutide (TRULICITY) 4.16 SA/6.3KZ SOPN Inject 0.75 mg into the skin every 7 (seven) days.      Marland Kitchen ezetimibe (ZETIA) 10 MG tablet Take 5 mg by mouth daily.       Marland Kitchen  glucagon (GLUCAGON EMERGENCY) 1 MG injection Inject 1 mg into the vein once as needed.      . hydrochlorothiazide 25 MG tablet Take 25 mg by mouth daily.        . Insulin Human (INSULIN PUMP) 100 unit/ml SOLN Inject into the skin continuous. Max 155 U/day      . lisinopril (PRINIVIL,ZESTRIL) 40 MG tablet Take 40 mg by mouth daily.        . pravastatin (PRAVACHOL) 40 MG tablet Take 40 mg by mouth daily.        Marland Kitchen warfarin (COUMADIN) 2.5 MG tablet TAKE 1 TABLET DAILY OR AS DIRECTED BY COUMADIN CLINIC  90 tablet  2   No current facility-administered medications for this visit.    ROS: See HPI for pertinent positives and negatives.   Physical Examination  Filed Vitals:   11/23/13 1611  BP: 139/75  Pulse: 75  Resp: 16  Height: 5\' 11"  (1.803 m)  Weight: 260 lb (117.935 kg)  SpO2: 97%   Body mass  index is 36.28 kg/(m^2).  General: A&O x 3, WDWN, obese male.  Gait: slow and deliberate, using cane Eyes: Pupils equal  Pulmonary: CTAB, without wheezes , rales or rhonchi  Cardiac: regular Rythm , without murmur   Carotid Bruits  Left  Right    Negative  Negative    Radial pulses: 1+ palpable bilaterally   VASCULAR EXAM:  Extremities without ischemic changes  without Gangrene; without open wounds. Wearing prosthesis on right BKA.   LE Pulses  LEFT  RIGHT   FEMORAL Not palpable Not palpable  POPLITEAL  not palpable  not palpable   POSTERIOR TIBIAL  not palpable  BKA   DORSALIS PEDIS  ANTERIOR TIBIAL  not palpable  BKA    Abdomen: soft, NT, no masses palpated.  Skin: no rashes, no ulcers noted.  Musculoskeletal: no muscle wasting or atrophy, wearing right BKA prosthesis.  Neurologic: A&O X 3; Appropriate Affect ; SENSATION: normal; MOTOR FUNCTION: moving all extremities equally, motor strength 5/5 in UE's, 4/5 in LE's. Speech is fluent/normal. CN 2-12 grossly intact.   Non-Invasive Vascular Imaging: DATE: 11/23/2013 ABI: RIGHT: BKA,  LEFT: does not correlate with the tibial artery waveforms, indicating the presence of medial calcification, biphasic and triphasic waveforms noted. Left TBI: 0.40 Previous (03/31/13) ABI's: Left: 1.08 using the PTA   ASSESSMENT:Patrick Shelton is a 73 y.o. 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  Left LE Doppler waveforms are tri and biphasic. He does not walk much since he feels unbalanced on his right AKA ,prosthesis states he will be refitted, he is using a cane to walk. ABI waveforms are unchanged from 8 months ago, his DM is under better control with the help of his endocrinologist, but pt admits not being very motivated to get his hyperglycemia under better control.  Fortunately he quit smoking in 1979.    PLAN:  Seated legs and arm exercises daily as  discussed. I discussed in depth with the patient the nature of atherosclerosis, and emphasized the importance of maximal medical management including strict control of blood pressure, blood glucose, and lipid levels, obtaining regular exercise, and continued cessation of smoking.  The patient is aware that without maximal medical management the underlying atherosclerotic disease process will progress, limiting the benefit of any interventions.  Based on the patient's vascular studies and examination, pt will return to clinic in 1  year for right ABI/TBI. Seated leg and arm exercises daily as discussed.  The patient was given information about PAD including signs, symptoms, treatment, what symptoms should prompt the patient to seek immediate medical care, and risk reduction measures to take.  Clemon Chambers, RN, MSN, FNP-C [facility] Phone: [phone]  Clinic MD: Bridgett Larsson  11/23/2013 4:10 PM

## 2013-11-24 NOTE — Addendum Note (Signed)
Addended by: Mena Goes on: 11/24/2013 09:25 AM   Modules accepted: Orders

## 2013-12-21 ENCOUNTER — Telehealth: Payer: Self-pay | Admitting: Cardiovascular Disease

## 2013-12-22 NOTE — Telephone Encounter (Signed)
Closed encounter °

## 2013-12-29 ENCOUNTER — Ambulatory Visit: Payer: Medicare Other | Admitting: Pharmacist Clinician (PhC)/ Clinical Pharmacy Specialist

## 2014-01-01 ENCOUNTER — Ambulatory Visit (INDEPENDENT_AMBULATORY_CARE_PROVIDER_SITE_OTHER): Payer: Medicare Other | Admitting: Pharmacist Clinician (PhC)/ Clinical Pharmacy Specialist

## 2014-01-01 DIAGNOSIS — Z7901 Long term (current) use of anticoagulants: Secondary | ICD-10-CM

## 2014-01-01 DIAGNOSIS — Z8672 Personal history of thrombophlebitis: Secondary | ICD-10-CM

## 2014-01-01 LAB — POCT INR: INR: 2.7

## 2014-02-12 ENCOUNTER — Ambulatory Visit (INDEPENDENT_AMBULATORY_CARE_PROVIDER_SITE_OTHER): Payer: Medicare Other | Admitting: Pharmacist Clinician (PhC)/ Clinical Pharmacy Specialist

## 2014-02-12 DIAGNOSIS — Z7901 Long term (current) use of anticoagulants: Secondary | ICD-10-CM

## 2014-02-12 DIAGNOSIS — Z8672 Personal history of thrombophlebitis: Secondary | ICD-10-CM

## 2014-02-12 LAB — POCT INR: INR: 2.9

## 2014-02-17 ENCOUNTER — Other Ambulatory Visit: Payer: Self-pay | Admitting: Pharmacist Clinician (PhC)/ Clinical Pharmacy Specialist

## 2014-03-15 DIAGNOSIS — J3489 Other specified disorders of nose and nasal sinuses: Secondary | ICD-10-CM | POA: Diagnosis not present

## 2014-03-15 DIAGNOSIS — R0982 Postnasal drip: Secondary | ICD-10-CM | POA: Diagnosis not present

## 2014-03-15 DIAGNOSIS — J342 Deviated nasal septum: Secondary | ICD-10-CM | POA: Diagnosis not present

## 2014-03-21 DIAGNOSIS — M25511 Pain in right shoulder: Secondary | ICD-10-CM | POA: Diagnosis not present

## 2014-03-22 ENCOUNTER — Other Ambulatory Visit: Payer: Self-pay | Admitting: Orthopedic Surgery

## 2014-03-22 DIAGNOSIS — M25511 Pain in right shoulder: Secondary | ICD-10-CM

## 2014-03-26 ENCOUNTER — Ambulatory Visit
Admission: RE | Admit: 2014-03-26 | Discharge: 2014-03-26 | Disposition: A | Discharge status: Home / Self Care | Payer: Medicare Other | Source: Ambulatory Visit | Attending: Orthopedic Surgery | Admitting: Orthopedic Surgery

## 2014-03-26 ENCOUNTER — Ambulatory Visit: Payer: Medicare Other | Admitting: Pharmacist Clinician (PhC)/ Clinical Pharmacy Specialist

## 2014-03-26 DIAGNOSIS — S46811A Strain of other muscles, fascia and tendons at shoulder and upper arm level, right arm, initial encounter: Secondary | ICD-10-CM | POA: Diagnosis not present

## 2014-03-26 DIAGNOSIS — M25511 Pain in right shoulder: Secondary | ICD-10-CM

## 2014-03-29 ENCOUNTER — Telehealth: Payer: Self-pay | Admitting: Pharmacist Clinician (PhC)/ Clinical Pharmacy Specialist

## 2014-03-29 DIAGNOSIS — M12811 Other specific arthropathies, not elsewhere classified, right shoulder: Secondary | ICD-10-CM | POA: Diagnosis not present

## 2014-04-02 NOTE — Telephone Encounter (Signed)
LMOM for patient, would like to get him onto Eliquis, not sure what insurance coverage is.  Asked patient to return call.  If no prescription insurance may be able to get him onto [facility].  Would need updated BMET to calculate CrCl.

## 2014-04-02 NOTE — Telephone Encounter (Signed)
Sanda Klein, MD  Tommy Medal, RPH-CPP           Understand and agree  MCr       Previous Messages     ----- Message -----   From: Tommy Medal, RPH-CPP   Sent: 03/29/2014  9:53 AM    To: Sanda Klein, MD   Dr. Loletha Grayer   Mr Pavey is having more problems with mobility and getting to appointments. I would like to switch him to Eliquis 5 mg bid (dx: recurrent PE). Any concerns with this? Not sure he's willing to endure whatever cost, but he isn't safe not being able to get into the office and he doesn't qualify for home health for this.   Erasmo Downer

## 2014-04-03 ENCOUNTER — Emergency Department (HOSPITAL_COMMUNITY): Payer: Medicare Other

## 2014-04-03 ENCOUNTER — Emergency Department (HOSPITAL_COMMUNITY)
Admission: EM | Admit: 2014-04-03 | Discharge: 2014-04-04 | Disposition: A | Discharge status: Home / Self Care | Payer: Medicare Other | Attending: Emergency Medicine | Admitting: Emergency Medicine

## 2014-04-03 ENCOUNTER — Encounter (HOSPITAL_COMMUNITY): Payer: Self-pay | Admitting: Emergency Medicine

## 2014-04-03 DIAGNOSIS — M79605 Pain in left leg: Secondary | ICD-10-CM | POA: Diagnosis not present

## 2014-04-03 DIAGNOSIS — N189 Chronic kidney disease, unspecified: Secondary | ICD-10-CM | POA: Diagnosis not present

## 2014-04-03 DIAGNOSIS — Z7901 Long term (current) use of anticoagulants: Secondary | ICD-10-CM | POA: Insufficient documentation

## 2014-04-03 DIAGNOSIS — Z794 Long term (current) use of insulin: Secondary | ICD-10-CM | POA: Insufficient documentation

## 2014-04-03 DIAGNOSIS — M109 Gout, unspecified: Secondary | ICD-10-CM | POA: Diagnosis not present

## 2014-04-03 DIAGNOSIS — M10062 Idiopathic gout, left knee: Secondary | ICD-10-CM | POA: Insufficient documentation

## 2014-04-03 DIAGNOSIS — Z79899 Other long term (current) drug therapy: Secondary | ICD-10-CM | POA: Diagnosis not present

## 2014-04-03 DIAGNOSIS — Z8546 Personal history of malignant neoplasm of prostate: Secondary | ICD-10-CM | POA: Insufficient documentation

## 2014-04-03 DIAGNOSIS — Z87891 Personal history of nicotine dependence: Secondary | ICD-10-CM | POA: Diagnosis not present

## 2014-04-03 DIAGNOSIS — E785 Hyperlipidemia, unspecified: Secondary | ICD-10-CM | POA: Diagnosis not present

## 2014-04-03 DIAGNOSIS — E119 Type 2 diabetes mellitus without complications: Secondary | ICD-10-CM | POA: Diagnosis not present

## 2014-04-03 DIAGNOSIS — I472 Ventricular tachycardia: Secondary | ICD-10-CM | POA: Diagnosis not present

## 2014-04-03 DIAGNOSIS — M1712 Unilateral primary osteoarthritis, left knee: Secondary | ICD-10-CM

## 2014-04-03 DIAGNOSIS — I129 Hypertensive chronic kidney disease with stage 1 through stage 4 chronic kidney disease, or unspecified chronic kidney disease: Secondary | ICD-10-CM | POA: Diagnosis not present

## 2014-04-03 DIAGNOSIS — M179 Osteoarthritis of knee, unspecified: Secondary | ICD-10-CM | POA: Diagnosis not present

## 2014-04-03 DIAGNOSIS — M25562 Pain in left knee: Secondary | ICD-10-CM | POA: Diagnosis present

## 2014-04-03 DIAGNOSIS — R52 Pain, unspecified: Secondary | ICD-10-CM | POA: Diagnosis not present

## 2014-04-03 LAB — COMPREHENSIVE METABOLIC PANEL
ALK PHOS: 96 U/L (ref 39–117)
ALT: 26 U/L (ref 0–53)
AST: 33 U/L (ref 0–37)
Albumin: 3.1 g/dL — ABNORMAL LOW (ref 3.5–5.2)
Anion gap: 11 (ref 5–15)
BUN: 62 mg/dL — ABNORMAL HIGH (ref 6–23)
CALCIUM: 9.2 mg/dL (ref 8.4–10.5)
CO2: 26 mmol/L (ref 19–32)
CREATININE: 2.11 mg/dL — AB (ref 0.50–1.35)
Chloride: 100 mmol/L (ref 96–112)
GFR calc Af Amer: 34 mL/min — ABNORMAL LOW (ref >90)
GFR calc non Af Amer: 29 mL/min — ABNORMAL LOW (ref >90)
GLUCOSE: 63 mg/dL — AB (ref 70–99)
Potassium: 4.2 mmol/L (ref 3.5–5.1)
Sodium: 137 mmol/L (ref 135–145)
TOTAL PROTEIN: 8.4 g/dL — AB (ref 6.0–8.3)
Total Bilirubin: 0.7 mg/dL (ref 0.3–1.2)

## 2014-04-03 LAB — CBC WITH DIFFERENTIAL/PLATELET
BASOS PCT: 0 % (ref 0–1)
Basophils Absolute: 0 10*3/uL (ref 0.0–0.1)
EOS PCT: 1 % (ref 0–5)
Eosinophils Absolute: 0.2 10*3/uL (ref 0.0–0.7)
HCT: 33.9 % — ABNORMAL LOW (ref 39.0–52.0)
Hemoglobin: 11.2 g/dL — ABNORMAL LOW (ref 13.0–17.0)
LYMPHS ABS: 3.2 10*3/uL (ref 0.7–4.0)
Lymphocytes Relative: 18 % (ref 12–46)
MCH: 28.9 pg (ref 26.0–34.0)
MCHC: 33 g/dL (ref 30.0–36.0)
MCV: 87.6 fL (ref 78.0–100.0)
Monocytes Absolute: 1.2 10*3/uL — ABNORMAL HIGH (ref 0.1–1.0)
Monocytes Relative: 7 % (ref 3–12)
NEUTROS ABS: 13.2 10*3/uL — AB (ref 1.7–7.7)
NEUTROS PCT: 74 % (ref 43–77)
PLATELETS: 435 10*3/uL — AB (ref 150–400)
RBC: 3.87 MIL/uL — ABNORMAL LOW (ref 4.22–5.81)
RDW: 13.7 % (ref 11.5–15.5)
WBC: 17.9 10*3/uL — ABNORMAL HIGH (ref 4.0–10.5)

## 2014-04-03 LAB — PROTIME-INR
INR: 1.92 — AB (ref 0.00–1.49)
Prothrombin Time: 22.1 seconds — ABNORMAL HIGH (ref 11.6–15.2)

## 2014-04-03 LAB — SEDIMENTATION RATE: Sed Rate: 126 mm/hr — ABNORMAL HIGH (ref 0–16)

## 2014-04-03 MED ORDER — MORPHINE SULFATE 4 MG/ML IJ SOLN
4.0000 mg | Freq: Once | INTRAMUSCULAR | Status: AC
  Administered 2014-04-03: 4 mg via INTRAVENOUS
  Filled 2014-04-03: qty 1

## 2014-04-03 MED ORDER — LIDOCAINE-EPINEPHRINE 2 %-1:100000 IJ SOLN
20.0000 mL | Freq: Once | INTRAMUSCULAR | Status: AC
  Administered 2014-04-03: 20 mL via INTRADERMAL
  Filled 2014-04-03: qty 1

## 2014-04-03 MED ORDER — HYDROCODONE-ACETAMINOPHEN 5-325 MG PO TABS: 1.0000 | ORAL_TABLET | ORAL | Status: DC | PRN

## 2014-04-03 MED ORDER — SODIUM CHLORIDE 0.9 % IV SOLN
INTRAVENOUS | Status: DC
  Administered 2014-04-03: 19:00:00 via INTRAVENOUS

## 2014-04-03 MED ORDER — COLCHICINE 0.6 MG PO TABS: 0.6000 mg | ORAL_TABLET | Freq: Every day | ORAL | Status: DC

## 2014-04-03 NOTE — ED Notes (Signed)
Patient appears in no acute distress. He is talking in full sentences. No moaning.

## 2014-04-03 NOTE — Discharge Instructions (Signed)
Gout Gout is an inflammatory arthritis caused by a buildup of uric acid crystals in the joints. Uric acid is a chemical that is normally present in the blood. When the level of uric acid in the blood is too high it can form crystals that deposit in your joints and tissues. This causes joint redness, soreness, and swelling (inflammation). Repeat attacks are common. Over time, uric acid crystals can form into masses (tophi) near a joint, destroying bone and causing disfigurement. Gout is treatable and often preventable. CAUSES  The disease begins with elevated levels of uric acid in the blood. Uric acid is produced by your body when it breaks down a naturally found substance called purines. Certain foods you eat, such as meats and fish, contain high amounts of purines. Causes of an elevated uric acid level include:  Being passed down from parent to child (heredity).  Diseases that cause increased uric acid production (such as obesity, psoriasis, and certain cancers).  Excessive alcohol use.  Diet, especially diets rich in meat and seafood.  Medicines, including certain cancer-fighting medicines (chemotherapy), water pills (diuretics), and aspirin.  Chronic kidney disease. The kidneys are no longer able to remove uric acid well.  Problems with metabolism. Conditions strongly associated with gout include:  Obesity.  High blood pressure.  High cholesterol.  Diabetes. Not everyone with elevated uric acid levels gets gout. It is not understood why some people get gout and others do not. Surgery, joint injury, and eating too much of certain foods are some of the factors that can lead to gout attacks. SYMPTOMS   An attack of gout comes on quickly. It causes intense pain with redness, swelling, and warmth in a joint.  Fever can occur.  Often, only one joint is involved. Certain joints are more commonly involved:  Base of the big toe.  Knee.  Ankle.  Wrist.  Finger. Without  treatment, an attack usually goes away in a few days to weeks. Between attacks, you usually will not have symptoms, which is different from many other forms of arthritis. DIAGNOSIS  Your caregiver will suspect gout based on your symptoms and exam. In some cases, tests may be recommended. The tests may include:  Blood tests.  Urine tests.  X-rays.  Joint fluid exam. This exam requires a needle to remove fluid from the joint (arthrocentesis). Using a microscope, gout is confirmed when uric acid crystals are seen in the joint fluid. TREATMENT  There are two phases to gout treatment: treating the sudden onset (acute) attack and preventing attacks (prophylaxis).  Treatment of an Acute Attack.  Medicines are used. These include anti-inflammatory medicines or steroid medicines.  An injection of steroid medicine into the affected joint is sometimes necessary.  The painful joint is rested. Movement can worsen the arthritis.  You may use warm or cold treatments on painful joints, depending which works best for you.  Treatment to Prevent Attacks.  If you suffer from frequent gout attacks, your caregiver may advise preventive medicine. These medicines are started after the acute attack subsides. These medicines either help your kidneys eliminate uric acid from your body or decrease your uric acid production. You may need to stay on these medicines for a very long time.  The early phase of treatment with preventive medicine can be associated with an increase in acute gout attacks. For this reason, during the first few months of treatment, your caregiver may also advise you to take medicines usually used for acute gout treatment. Be sure you  understand your caregiver's directions. Your caregiver may make several adjustments to your medicine dose before these medicines are effective.  Discuss dietary treatment with your caregiver or dietitian. Alcohol and drinks high in sugar and fructose and foods  such as meat, poultry, and seafood can increase uric acid levels. Your caregiver or dietitian can advise you on drinks and foods that should be limited. HOME CARE INSTRUCTIONS   Do not take aspirin to relieve pain. This raises uric acid levels.  Only take over-the-counter or prescription medicines for pain, discomfort, or fever as directed by your caregiver.  Rest the joint as much as possible. When in bed, keep sheets and blankets off painful areas.  Keep the affected joint raised (elevated).  Apply warm or cold treatments to painful joints. Use of warm or cold treatments depends on which works best for you.  Use crutches if the painful joint is in your leg.  Drink enough fluids to keep your urine clear or pale yellow. This helps your body get rid of uric acid. Limit alcohol, sugary drinks, and fructose drinks.  Follow your dietary instructions. Pay careful attention to the amount of protein you eat. Your daily diet should emphasize fruits, vegetables, whole grains, and fat-free or low-fat milk products. Discuss the use of coffee, vitamin C, and cherries with your caregiver or dietitian. These may be helpful in lowering uric acid levels.  Maintain a healthy body weight. SEEK MEDICAL CARE IF:   You develop diarrhea, vomiting, or any side effects from medicines.  You do not feel better in 24 hours, or you are getting worse. SEEK IMMEDIATE MEDICAL CARE IF:   Your joint becomes suddenly more tender, and you have chills or a fever. MAKE SURE YOU:   Understand these instructions.  Will watch your condition.  Will get help right away if you are not doing well or get worse. Document Released: 02/21/2000 Document Revised: 07/10/2013 Document Reviewed: 10/07/2011 [facility] Patient Information 2015 [place], [place]. This information is not intended to replace advice given to you by your health care provider. Make sure you discuss any questions you have with your health care  provider.  Arthritis, Nonspecific Arthritis is pain, redness, warmth, or puffiness (inflammation) of a joint. The joint may be stiff or hurt when you move it. One or more joints may be affected. There are many types of arthritis. Your doctor may not know what type you have right away. The most common cause of arthritis is wear and tear on the joint (osteoarthritis). HOME CARE   Only take medicine as told by your doctor.  Rest the joint as much as possible.  Raise (elevate) your joint if it is puffy.  Use crutches if the painful joint is in your leg.  Drink enough fluids to keep your pee (urine) clear or pale yellow.  Follow your doctor's diet instructions.  Use cold packs for very bad joint pain for 10 to 15 minutes every hour. Ask your doctor if it is okay for you to use hot packs.  Exercise as told by your doctor.  Take a warm shower if you have stiffness in the morning.  Move your sore joints throughout the day. GET HELP RIGHT AWAY IF:   You have a fever.  You have very bad joint pain, puffiness, or redness.  You have many joints that are painful and puffy.  You are not getting better with treatment.  You have very bad back pain or leg weakness.  You cannot control when you  poop (bowel movement) or pee (urinate).  You do not feel better in 24 hours or are getting worse.  You are having side effects from your medicine. MAKE SURE YOU:   Understand these instructions.  Will watch your condition.  Will get help right away if you are not doing well or get worse. Document Released: 05/20/2009 Document Revised: 08/25/2011 Document Reviewed: 05/20/2009 [facility] Patient Information 2015 [place], [place]. This information is not intended to replace advice given to you by your health care provider. Make sure you discuss any questions you have with your health care provider.

## 2014-04-03 NOTE — ED Notes (Signed)
Patient transported to X-ray 

## 2014-04-03 NOTE — ED Provider Notes (Signed)
  Physical Exam  BP 118/84 mmHg  Pulse 84  Temp(Src) 98.2 F (36.8 C) (Oral)  Resp 18  SpO2 94%  Physical Exam L knee with no effusion, mild crepitus, no erythema or warmth  ED Course  ARTHOCENTESIS Date/Time: 04/03/2014 9:30 PM Performed by: Shann Medal, Anesha Hackert STRUPP Authorized by: Corine Shelter Consent: Verbal consent obtained. Risks and benefits: risks, benefits and alternatives were discussed Consent given by: patient Patient understanding: patient states understanding of the procedure being performed Patient consent: the patient's understanding of the procedure matches consent given Patient identity confirmed: verbally with patient Indications: diagnostic evaluation  Body area: knee Joint: left knee Local anesthesia used: yes Anesthesia: local infiltration Local anesthetic: lidocaine 2% with epinephrine Anesthetic total: 1 ml Patient sedated: no Preparation: Patient was prepped and draped in the usual sterile fashion. Needle gauge: 18 G Ultrasound guidance: no Approach: lateral Aspirate amount: 0 mL Patient tolerance: Patient tolerated the procedure well with no immediate complications Comments: Aspiration attempted without any fluid return      [employer], PA-C 04/03/14 2150

## 2014-04-03 NOTE — ED Notes (Signed)
Bed: XY72 Expected date:  Expected time:  Means of arrival:  Comments: Ems- leg pain

## 2014-04-03 NOTE — ED Notes (Signed)
Per EMS/patient was at [facility] in [facility] today for B/L leg pain which started four days ago-sent here for possible infection-no obvious wounds-BKA on right

## 2014-04-03 NOTE — ED Provider Notes (Signed)
CSN: [id number]     Arrival date & time 04/03/14  1815 History   First MD Initiated Contact with Patient 04/03/14 1826     Chief Complaint  Patient presents with  . Leg Pain   HPI Patient presents to emergency room with complaints of lower extremity pain. He did have some pain in his right stump and noticed some swelling where it was difficult to get his prosthesis on but that does seem to be getting better. Over the last several days however he had more pain in his left knee. He has noticed swelling and it is hard for him to stand. Patient denies any trouble with fevers or chills. He has not noticed any redness. He has not had any recent injuries. The patient does have a history of gout but it is never affected his knee before. He saw his doctor today and was told to come to the emergency room to be evaluated. Past Medical History  Diagnosis Date  . Hypertension   . Diabetes mellitus   . PVD (peripheral vascular disease)   . S/P BKA (below knee amputation)   . Hyperlipidemia   . PVT (paroxysmal ventricular tachycardia)   . ED (erectile dysfunction)   . Chronic kidney disease   . Cancer     8 wks Radiation  . Hx of agent Orange exposure     while [place] in [place] during that conflict  . 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 Arthritis Sept. 2015    Gout-Right Hand   Past Surgical History  Procedure Laterality Date  . Amputation Right 2009    BKA  . Prostate surgery      8 wks radiation   Family History  Problem Relation Age of Onset  . Dementia Mother   . Diabetes Father     Amputation- Bilateral leg  . Peripheral vascular disease Father    History  Substance Use Topics  . Smoking status: Former Smoker    Quit date: 03/09/1977  . Smokeless tobacco: Never Used  . Alcohol Use: No    Review of Systems  All other systems reviewed and are negative.     Allergies  Prednisone  Home Medications   Prior to Admission medications   Medication Sig  Start Date End Date Taking? Authorizing Provider  amLODipine (NORVASC) 10 MG tablet Take 10 mg by mouth daily.      Historical Provider, MD  Cholecalciferol (VITAMIN D-3) 5000 UNITS TABS Take by mouth daily.    Historical Provider, MD  Dulaglutide (TRULICITY) 1.58 XE/9.4MH SOPN Inject 0.75 mg into the skin every 7 (seven) days.    Historical Provider, MD  ezetimibe (ZETIA) 10 MG tablet Take 5 mg by mouth daily.     Historical Provider, MD  glucagon (GLUCAGON EMERGENCY) 1 MG injection Inject 1 mg into the vein once as needed.    Historical Provider, MD  hydrochlorothiazide 25 MG tablet Take 25 mg by mouth daily.      Historical Provider, MD  Insulin Human (INSULIN PUMP) 100 unit/ml SOLN Inject into the skin continuous. Max 155 U/day    Historical Provider, MD  lisinopril (PRINIVIL,ZESTRIL) 40 MG tablet Take 40 mg by mouth daily.      Historical Provider, MD  pravastatin (PRAVACHOL) 40 MG tablet Take 40 mg by mouth daily.      Historical Provider, MD  warfarin (COUMADIN) 2.5 MG tablet TAKE 1 TABLET DAILY OR AS DIRECTED BY COUMADIN CLINIC 02/19/14  Mihai Croitoru, MD   BP 128/65 mmHg  Pulse 83  Temp(Src) 98.6 F (37 C) (Oral)  Resp 16  SpO2 96% Physical Exam  Constitutional: He appears well-developed and well-nourished. No distress.  HENT:  Head: Normocephalic and atraumatic.  Right Ear: External ear normal.  Left Ear: External ear normal.  Eyes: Conjunctivae are normal. Right eye exhibits no discharge. Left eye exhibits no discharge. No scleral icterus.  Neck: Neck supple. No tracheal deviation present.  Cardiovascular: Normal rate, regular rhythm and intact distal pulses.   Pulmonary/Chest: Effort normal and breath sounds normal. No stridor. No respiratory distress. He has no wheezes. He has no rales.  Abdominal: Soft. Bowel sounds are normal. He exhibits no distension. There is no tenderness. There is no rebound and no guarding.  Musculoskeletal: He exhibits no edema.       Left knee:  He exhibits decreased range of motion, swelling and effusion. He exhibits no erythema. Tenderness found.  Status post BKA right lower extremity, no erythema or edema  Neurological: He is alert. He has normal strength. No cranial nerve deficit (no facial droop, extraocular movements intact, no slurred speech) or sensory deficit. He exhibits normal muscle tone. He displays no seizure activity. Coordination normal.  Skin: Skin is warm and dry. No rash noted.  Psychiatric: He has a normal mood and affect.  Nursing note and vitals reviewed.   ED Course  Procedures (including critical care time) Labs Review Labs Reviewed  CBC WITH DIFFERENTIAL/PLATELET - Abnormal; Notable for the following:    WBC 17.9 (*)    RBC 3.87 (*)    Hemoglobin 11.2 (*)    HCT 33.9 (*)    Platelets 435 (*)    Neutro Abs 13.2 (*)    Monocytes Absolute 1.2 (*)    All other components within normal limits  COMPREHENSIVE METABOLIC PANEL - Abnormal; Notable for the following:    Glucose, Bld 63 (*)    BUN 62 (*)    Creatinine, Ser 2.11 (*)    Total Protein 8.4 (*)    Albumin 3.1 (*)    GFR calc non Af Amer 29 (*)    GFR calc Af Amer 34 (*)    All other components within normal limits  SEDIMENTATION RATE - Abnormal; Notable for the following:    Sed Rate 126 (*)    All other components within normal limits  PROTIME-INR - Abnormal; Notable for the following:    Prothrombin Time 22.1 (*)    INR 1.92 (*)    All other components within normal limits    Imaging Review Dg Knee Complete 4 Views Left  04/03/2014   CLINICAL DATA:  Left knee pain for 4 days.  No trauma.  EXAM: LEFT KNEE - COMPLETE 4+ VIEW  COMPARISON:  None.  FINDINGS: Negative for fracture, dislocation or radiopaque foreign body. There is old fragmentation at the inferior patellar pole due to sequela of prior trauma or inflammation. There are moderate patellofemoral degenerative changes, with milder arthritic changes involving the medial and lateral  compartments. No bone lesion or bony destruction is evident.  IMPRESSION: Old degenerative and posttraumatic changes about the patella. No acute findings.   Electronically Signed   By: Andreas Newport M.D.   On: 04/03/2014 19:22     MDM   Final diagnoses:  Osteoarthritis of left knee, unspecified osteoarthritis type  Acute gout of left knee, unspecified cause    Pt has degenerative changes of the knee.  No effusion noted on  xray.  Pt does have elevated esr and slight increase in WBC but no fluid aspirated from knee joint.  Xray did not show a significant effusion.  Suspect symptoms may be related to gout.  Doubt acute infection.  No warmth or eythema on exam.  Will dc home with pain meds and colchicine.    Dorie Rank, MD 04/03/14 2253

## 2014-04-03 NOTE — ED Notes (Addendum)
Patient was assisted with urinal by NT and new diaper placed. Pt is constantly talking with no moaning except with movement.

## 2014-04-04 DIAGNOSIS — S88011A Complete traumatic amputation at knee level, right lower leg, initial encounter: Secondary | ICD-10-CM | POA: Diagnosis not present

## 2014-04-04 DIAGNOSIS — M1990 Unspecified osteoarthritis, unspecified site: Secondary | ICD-10-CM | POA: Diagnosis not present

## 2014-04-04 MED ORDER — HYDROCODONE-ACETAMINOPHEN 5-325 MG PO TABS
1.0000 | ORAL_TABLET | ORAL | Status: DC | PRN
Start: 2014-04-04 — End: 2015-09-09

## 2014-04-04 MED ORDER — COLCHICINE 0.6 MG PO TABS: 0.6000 mg | ORAL_TABLET | Freq: Every day | ORAL | Status: DC

## 2014-04-04 NOTE — ED Notes (Signed)
[facility] called for transport.  

## 2014-04-06 NOTE — Telephone Encounter (Signed)
Spoke with patient, he is unsure about safety of NOACs, feels more comfortable with warfarin.  Understands that the need to switch may be dependent on his mobility.  Will continue warfarin for now, knows to call on days he can get out for possible INR checks

## 2014-04-09 ENCOUNTER — Telehealth: Payer: Self-pay | Admitting: *Deleted

## 2014-04-09 DIAGNOSIS — J309 Allergic rhinitis, unspecified: Secondary | ICD-10-CM | POA: Diagnosis not present

## 2014-04-09 DIAGNOSIS — M1000 Idiopathic gout, unspecified site: Secondary | ICD-10-CM | POA: Diagnosis not present

## 2014-04-09 DIAGNOSIS — E785 Hyperlipidemia, unspecified: Secondary | ICD-10-CM | POA: Diagnosis not present

## 2014-04-09 DIAGNOSIS — E0859 Diabetes mellitus due to underlying condition with other circulatory complications: Secondary | ICD-10-CM | POA: Diagnosis not present

## 2014-04-09 DIAGNOSIS — E559 Vitamin D deficiency, unspecified: Secondary | ICD-10-CM | POA: Diagnosis not present

## 2014-04-09 DIAGNOSIS — N183 Chronic kidney disease, stage 3 (moderate): Secondary | ICD-10-CM | POA: Diagnosis not present

## 2014-04-09 DIAGNOSIS — E1122 Type 2 diabetes mellitus with diabetic chronic kidney disease: Secondary | ICD-10-CM | POA: Diagnosis not present

## 2014-04-09 DIAGNOSIS — G629 Polyneuropathy, unspecified: Secondary | ICD-10-CM | POA: Diagnosis not present

## 2014-04-09 NOTE — Telephone Encounter (Signed)
Signed order faxed for a patient lift assembly to [place].

## 2014-04-16 DIAGNOSIS — N183 Chronic kidney disease, stage 3 (moderate): Secondary | ICD-10-CM | POA: Diagnosis not present

## 2014-04-16 DIAGNOSIS — F329 Major depressive disorder, single episode, unspecified: Secondary | ICD-10-CM | POA: Diagnosis not present

## 2014-04-16 DIAGNOSIS — M1000 Idiopathic gout, unspecified site: Secondary | ICD-10-CM | POA: Diagnosis not present

## 2014-04-16 DIAGNOSIS — E1151 Type 2 diabetes mellitus with diabetic peripheral angiopathy without gangrene: Secondary | ICD-10-CM | POA: Diagnosis not present

## 2014-04-16 DIAGNOSIS — F419 Anxiety disorder, unspecified: Secondary | ICD-10-CM | POA: Diagnosis not present

## 2014-04-16 DIAGNOSIS — Z794 Long term (current) use of insulin: Secondary | ICD-10-CM | POA: Diagnosis not present

## 2014-04-16 DIAGNOSIS — Z7901 Long term (current) use of anticoagulants: Secondary | ICD-10-CM | POA: Diagnosis not present

## 2014-04-16 DIAGNOSIS — E1122 Type 2 diabetes mellitus with diabetic chronic kidney disease: Secondary | ICD-10-CM | POA: Diagnosis not present

## 2014-04-16 DIAGNOSIS — Z89511 Acquired absence of right leg below knee: Secondary | ICD-10-CM | POA: Diagnosis not present

## 2014-04-16 DIAGNOSIS — Z8546 Personal history of malignant neoplasm of prostate: Secondary | ICD-10-CM | POA: Diagnosis not present

## 2014-04-16 DIAGNOSIS — I1310 Hypertensive heart and chronic kidney disease without heart failure, with stage 1 through stage 4 chronic kidney disease, or unspecified chronic kidney disease: Secondary | ICD-10-CM | POA: Diagnosis not present

## 2014-04-17 DIAGNOSIS — E1151 Type 2 diabetes mellitus with diabetic peripheral angiopathy without gangrene: Secondary | ICD-10-CM | POA: Diagnosis not present

## 2014-04-17 DIAGNOSIS — I1310 Hypertensive heart and chronic kidney disease without heart failure, with stage 1 through stage 4 chronic kidney disease, or unspecified chronic kidney disease: Secondary | ICD-10-CM | POA: Diagnosis not present

## 2014-04-17 DIAGNOSIS — N183 Chronic kidney disease, stage 3 (moderate): Secondary | ICD-10-CM | POA: Diagnosis not present

## 2014-04-17 DIAGNOSIS — E1122 Type 2 diabetes mellitus with diabetic chronic kidney disease: Secondary | ICD-10-CM | POA: Diagnosis not present

## 2014-04-17 DIAGNOSIS — M1000 Idiopathic gout, unspecified site: Secondary | ICD-10-CM | POA: Diagnosis not present

## 2014-04-17 DIAGNOSIS — F419 Anxiety disorder, unspecified: Secondary | ICD-10-CM | POA: Diagnosis not present

## 2014-04-18 DIAGNOSIS — F419 Anxiety disorder, unspecified: Secondary | ICD-10-CM | POA: Diagnosis not present

## 2014-04-18 DIAGNOSIS — M1000 Idiopathic gout, unspecified site: Secondary | ICD-10-CM | POA: Diagnosis not present

## 2014-04-18 DIAGNOSIS — I1310 Hypertensive heart and chronic kidney disease without heart failure, with stage 1 through stage 4 chronic kidney disease, or unspecified chronic kidney disease: Secondary | ICD-10-CM | POA: Diagnosis not present

## 2014-04-18 DIAGNOSIS — E1122 Type 2 diabetes mellitus with diabetic chronic kidney disease: Secondary | ICD-10-CM | POA: Diagnosis not present

## 2014-04-18 DIAGNOSIS — N183 Chronic kidney disease, stage 3 (moderate): Secondary | ICD-10-CM | POA: Diagnosis not present

## 2014-04-18 DIAGNOSIS — E1151 Type 2 diabetes mellitus with diabetic peripheral angiopathy without gangrene: Secondary | ICD-10-CM | POA: Diagnosis not present

## 2014-04-19 DIAGNOSIS — F419 Anxiety disorder, unspecified: Secondary | ICD-10-CM | POA: Diagnosis not present

## 2014-04-19 DIAGNOSIS — N183 Chronic kidney disease, stage 3 (moderate): Secondary | ICD-10-CM | POA: Diagnosis not present

## 2014-04-19 DIAGNOSIS — M1000 Idiopathic gout, unspecified site: Secondary | ICD-10-CM | POA: Diagnosis not present

## 2014-04-19 DIAGNOSIS — I1310 Hypertensive heart and chronic kidney disease without heart failure, with stage 1 through stage 4 chronic kidney disease, or unspecified chronic kidney disease: Secondary | ICD-10-CM | POA: Diagnosis not present

## 2014-04-19 DIAGNOSIS — E1151 Type 2 diabetes mellitus with diabetic peripheral angiopathy without gangrene: Secondary | ICD-10-CM | POA: Diagnosis not present

## 2014-04-19 DIAGNOSIS — E1122 Type 2 diabetes mellitus with diabetic chronic kidney disease: Secondary | ICD-10-CM | POA: Diagnosis not present

## 2014-04-20 DIAGNOSIS — N183 Chronic kidney disease, stage 3 (moderate): Secondary | ICD-10-CM | POA: Diagnosis not present

## 2014-04-20 DIAGNOSIS — E1122 Type 2 diabetes mellitus with diabetic chronic kidney disease: Secondary | ICD-10-CM | POA: Diagnosis not present

## 2014-04-20 DIAGNOSIS — M1000 Idiopathic gout, unspecified site: Secondary | ICD-10-CM | POA: Diagnosis not present

## 2014-04-20 DIAGNOSIS — I1310 Hypertensive heart and chronic kidney disease without heart failure, with stage 1 through stage 4 chronic kidney disease, or unspecified chronic kidney disease: Secondary | ICD-10-CM | POA: Diagnosis not present

## 2014-04-20 DIAGNOSIS — F419 Anxiety disorder, unspecified: Secondary | ICD-10-CM | POA: Diagnosis not present

## 2014-04-20 DIAGNOSIS — E1151 Type 2 diabetes mellitus with diabetic peripheral angiopathy without gangrene: Secondary | ICD-10-CM | POA: Diagnosis not present

## 2014-04-24 DIAGNOSIS — I1310 Hypertensive heart and chronic kidney disease without heart failure, with stage 1 through stage 4 chronic kidney disease, or unspecified chronic kidney disease: Secondary | ICD-10-CM | POA: Diagnosis not present

## 2014-04-24 DIAGNOSIS — M1000 Idiopathic gout, unspecified site: Secondary | ICD-10-CM | POA: Diagnosis not present

## 2014-04-24 DIAGNOSIS — E1122 Type 2 diabetes mellitus with diabetic chronic kidney disease: Secondary | ICD-10-CM | POA: Diagnosis not present

## 2014-04-24 DIAGNOSIS — N183 Chronic kidney disease, stage 3 (moderate): Secondary | ICD-10-CM | POA: Diagnosis not present

## 2014-04-24 DIAGNOSIS — E1151 Type 2 diabetes mellitus with diabetic peripheral angiopathy without gangrene: Secondary | ICD-10-CM | POA: Diagnosis not present

## 2014-04-24 DIAGNOSIS — F419 Anxiety disorder, unspecified: Secondary | ICD-10-CM | POA: Diagnosis not present

## 2014-04-25 DIAGNOSIS — M1000 Idiopathic gout, unspecified site: Secondary | ICD-10-CM | POA: Diagnosis not present

## 2014-04-25 DIAGNOSIS — E1122 Type 2 diabetes mellitus with diabetic chronic kidney disease: Secondary | ICD-10-CM | POA: Diagnosis not present

## 2014-04-25 DIAGNOSIS — I1310 Hypertensive heart and chronic kidney disease without heart failure, with stage 1 through stage 4 chronic kidney disease, or unspecified chronic kidney disease: Secondary | ICD-10-CM | POA: Diagnosis not present

## 2014-04-25 DIAGNOSIS — N183 Chronic kidney disease, stage 3 (moderate): Secondary | ICD-10-CM | POA: Diagnosis not present

## 2014-04-25 DIAGNOSIS — E1151 Type 2 diabetes mellitus with diabetic peripheral angiopathy without gangrene: Secondary | ICD-10-CM | POA: Diagnosis not present

## 2014-04-25 DIAGNOSIS — F419 Anxiety disorder, unspecified: Secondary | ICD-10-CM | POA: Diagnosis not present

## 2014-04-27 DIAGNOSIS — N183 Chronic kidney disease, stage 3 (moderate): Secondary | ICD-10-CM | POA: Diagnosis not present

## 2014-04-27 DIAGNOSIS — F419 Anxiety disorder, unspecified: Secondary | ICD-10-CM | POA: Diagnosis not present

## 2014-04-27 DIAGNOSIS — E1122 Type 2 diabetes mellitus with diabetic chronic kidney disease: Secondary | ICD-10-CM | POA: Diagnosis not present

## 2014-04-27 DIAGNOSIS — E1151 Type 2 diabetes mellitus with diabetic peripheral angiopathy without gangrene: Secondary | ICD-10-CM | POA: Diagnosis not present

## 2014-04-27 DIAGNOSIS — I1310 Hypertensive heart and chronic kidney disease without heart failure, with stage 1 through stage 4 chronic kidney disease, or unspecified chronic kidney disease: Secondary | ICD-10-CM | POA: Diagnosis not present

## 2014-04-27 DIAGNOSIS — M1000 Idiopathic gout, unspecified site: Secondary | ICD-10-CM | POA: Diagnosis not present

## 2014-04-30 DIAGNOSIS — N183 Chronic kidney disease, stage 3 (moderate): Secondary | ICD-10-CM | POA: Diagnosis not present

## 2014-04-30 DIAGNOSIS — F419 Anxiety disorder, unspecified: Secondary | ICD-10-CM | POA: Diagnosis not present

## 2014-04-30 DIAGNOSIS — E1122 Type 2 diabetes mellitus with diabetic chronic kidney disease: Secondary | ICD-10-CM | POA: Diagnosis not present

## 2014-04-30 DIAGNOSIS — M1000 Idiopathic gout, unspecified site: Secondary | ICD-10-CM | POA: Diagnosis not present

## 2014-04-30 DIAGNOSIS — I1310 Hypertensive heart and chronic kidney disease without heart failure, with stage 1 through stage 4 chronic kidney disease, or unspecified chronic kidney disease: Secondary | ICD-10-CM | POA: Diagnosis not present

## 2014-04-30 DIAGNOSIS — E1151 Type 2 diabetes mellitus with diabetic peripheral angiopathy without gangrene: Secondary | ICD-10-CM | POA: Diagnosis not present

## 2014-05-02 DIAGNOSIS — N183 Chronic kidney disease, stage 3 (moderate): Secondary | ICD-10-CM | POA: Diagnosis not present

## 2014-05-02 DIAGNOSIS — F419 Anxiety disorder, unspecified: Secondary | ICD-10-CM | POA: Diagnosis not present

## 2014-05-02 DIAGNOSIS — I1310 Hypertensive heart and chronic kidney disease without heart failure, with stage 1 through stage 4 chronic kidney disease, or unspecified chronic kidney disease: Secondary | ICD-10-CM | POA: Diagnosis not present

## 2014-05-02 DIAGNOSIS — M1000 Idiopathic gout, unspecified site: Secondary | ICD-10-CM | POA: Diagnosis not present

## 2014-05-02 DIAGNOSIS — E1151 Type 2 diabetes mellitus with diabetic peripheral angiopathy without gangrene: Secondary | ICD-10-CM | POA: Diagnosis not present

## 2014-05-02 DIAGNOSIS — E1122 Type 2 diabetes mellitus with diabetic chronic kidney disease: Secondary | ICD-10-CM | POA: Diagnosis not present

## 2014-05-03 DIAGNOSIS — E1122 Type 2 diabetes mellitus with diabetic chronic kidney disease: Secondary | ICD-10-CM | POA: Diagnosis not present

## 2014-05-03 DIAGNOSIS — I1310 Hypertensive heart and chronic kidney disease without heart failure, with stage 1 through stage 4 chronic kidney disease, or unspecified chronic kidney disease: Secondary | ICD-10-CM | POA: Diagnosis not present

## 2014-05-03 DIAGNOSIS — R809 Proteinuria, unspecified: Secondary | ICD-10-CM | POA: Diagnosis not present

## 2014-05-03 DIAGNOSIS — F419 Anxiety disorder, unspecified: Secondary | ICD-10-CM | POA: Diagnosis not present

## 2014-05-03 DIAGNOSIS — R7309 Other abnormal glucose: Secondary | ICD-10-CM | POA: Diagnosis not present

## 2014-05-03 DIAGNOSIS — Z0289 Encounter for other administrative examinations: Secondary | ICD-10-CM | POA: Diagnosis not present

## 2014-05-03 DIAGNOSIS — M1000 Idiopathic gout, unspecified site: Secondary | ICD-10-CM | POA: Diagnosis not present

## 2014-05-03 DIAGNOSIS — R946 Abnormal results of thyroid function studies: Secondary | ICD-10-CM | POA: Diagnosis not present

## 2014-05-03 DIAGNOSIS — E559 Vitamin D deficiency, unspecified: Secondary | ICD-10-CM | POA: Diagnosis not present

## 2014-05-03 DIAGNOSIS — N183 Chronic kidney disease, stage 3 (moderate): Secondary | ICD-10-CM | POA: Diagnosis not present

## 2014-05-03 DIAGNOSIS — M109 Gout, unspecified: Secondary | ICD-10-CM | POA: Diagnosis not present

## 2014-05-03 DIAGNOSIS — R7989 Other specified abnormal findings of blood chemistry: Secondary | ICD-10-CM | POA: Diagnosis not present

## 2014-05-03 DIAGNOSIS — E1151 Type 2 diabetes mellitus with diabetic peripheral angiopathy without gangrene: Secondary | ICD-10-CM | POA: Diagnosis not present

## 2014-05-03 DIAGNOSIS — N390 Urinary tract infection, site not specified: Secondary | ICD-10-CM | POA: Diagnosis not present

## 2014-05-04 DIAGNOSIS — N183 Chronic kidney disease, stage 3 (moderate): Secondary | ICD-10-CM | POA: Diagnosis not present

## 2014-05-04 DIAGNOSIS — E1122 Type 2 diabetes mellitus with diabetic chronic kidney disease: Secondary | ICD-10-CM | POA: Diagnosis not present

## 2014-05-04 DIAGNOSIS — E1151 Type 2 diabetes mellitus with diabetic peripheral angiopathy without gangrene: Secondary | ICD-10-CM | POA: Diagnosis not present

## 2014-05-04 DIAGNOSIS — I1310 Hypertensive heart and chronic kidney disease without heart failure, with stage 1 through stage 4 chronic kidney disease, or unspecified chronic kidney disease: Secondary | ICD-10-CM | POA: Diagnosis not present

## 2014-05-04 DIAGNOSIS — F419 Anxiety disorder, unspecified: Secondary | ICD-10-CM | POA: Diagnosis not present

## 2014-05-04 DIAGNOSIS — M1000 Idiopathic gout, unspecified site: Secondary | ICD-10-CM | POA: Diagnosis not present

## 2014-05-07 DIAGNOSIS — I1310 Hypertensive heart and chronic kidney disease without heart failure, with stage 1 through stage 4 chronic kidney disease, or unspecified chronic kidney disease: Secondary | ICD-10-CM | POA: Diagnosis not present

## 2014-05-07 DIAGNOSIS — M1000 Idiopathic gout, unspecified site: Secondary | ICD-10-CM | POA: Diagnosis not present

## 2014-05-07 DIAGNOSIS — N183 Chronic kidney disease, stage 3 (moderate): Secondary | ICD-10-CM | POA: Diagnosis not present

## 2014-05-07 DIAGNOSIS — E1151 Type 2 diabetes mellitus with diabetic peripheral angiopathy without gangrene: Secondary | ICD-10-CM | POA: Diagnosis not present

## 2014-05-07 DIAGNOSIS — E1122 Type 2 diabetes mellitus with diabetic chronic kidney disease: Secondary | ICD-10-CM | POA: Diagnosis not present

## 2014-05-07 DIAGNOSIS — F419 Anxiety disorder, unspecified: Secondary | ICD-10-CM | POA: Diagnosis not present

## 2014-05-08 DIAGNOSIS — G629 Polyneuropathy, unspecified: Secondary | ICD-10-CM | POA: Diagnosis not present

## 2014-05-08 DIAGNOSIS — E1122 Type 2 diabetes mellitus with diabetic chronic kidney disease: Secondary | ICD-10-CM | POA: Diagnosis not present

## 2014-05-08 DIAGNOSIS — J309 Allergic rhinitis, unspecified: Secondary | ICD-10-CM | POA: Diagnosis not present

## 2014-05-08 DIAGNOSIS — I1310 Hypertensive heart and chronic kidney disease without heart failure, with stage 1 through stage 4 chronic kidney disease, or unspecified chronic kidney disease: Secondary | ICD-10-CM | POA: Diagnosis not present

## 2014-05-08 DIAGNOSIS — E1151 Type 2 diabetes mellitus with diabetic peripheral angiopathy without gangrene: Secondary | ICD-10-CM | POA: Diagnosis not present

## 2014-05-08 DIAGNOSIS — E0859 Diabetes mellitus due to underlying condition with other circulatory complications: Secondary | ICD-10-CM | POA: Diagnosis not present

## 2014-05-08 DIAGNOSIS — F419 Anxiety disorder, unspecified: Secondary | ICD-10-CM | POA: Diagnosis not present

## 2014-05-08 DIAGNOSIS — E785 Hyperlipidemia, unspecified: Secondary | ICD-10-CM | POA: Diagnosis not present

## 2014-05-08 DIAGNOSIS — N183 Chronic kidney disease, stage 3 (moderate): Secondary | ICD-10-CM | POA: Diagnosis not present

## 2014-05-08 DIAGNOSIS — F331 Major depressive disorder, recurrent, moderate: Secondary | ICD-10-CM | POA: Diagnosis not present

## 2014-05-08 DIAGNOSIS — M1000 Idiopathic gout, unspecified site: Secondary | ICD-10-CM | POA: Diagnosis not present

## 2014-05-08 DIAGNOSIS — E559 Vitamin D deficiency, unspecified: Secondary | ICD-10-CM | POA: Diagnosis not present

## 2014-05-09 DIAGNOSIS — M1000 Idiopathic gout, unspecified site: Secondary | ICD-10-CM | POA: Diagnosis not present

## 2014-05-09 DIAGNOSIS — E1151 Type 2 diabetes mellitus with diabetic peripheral angiopathy without gangrene: Secondary | ICD-10-CM | POA: Diagnosis not present

## 2014-05-09 DIAGNOSIS — F419 Anxiety disorder, unspecified: Secondary | ICD-10-CM | POA: Diagnosis not present

## 2014-05-09 DIAGNOSIS — N183 Chronic kidney disease, stage 3 (moderate): Secondary | ICD-10-CM | POA: Diagnosis not present

## 2014-05-09 DIAGNOSIS — I1310 Hypertensive heart and chronic kidney disease without heart failure, with stage 1 through stage 4 chronic kidney disease, or unspecified chronic kidney disease: Secondary | ICD-10-CM | POA: Diagnosis not present

## 2014-05-09 DIAGNOSIS — E1122 Type 2 diabetes mellitus with diabetic chronic kidney disease: Secondary | ICD-10-CM | POA: Diagnosis not present

## 2014-05-11 DIAGNOSIS — E1122 Type 2 diabetes mellitus with diabetic chronic kidney disease: Secondary | ICD-10-CM | POA: Diagnosis not present

## 2014-05-11 DIAGNOSIS — N183 Chronic kidney disease, stage 3 (moderate): Secondary | ICD-10-CM | POA: Diagnosis not present

## 2014-05-11 DIAGNOSIS — E1151 Type 2 diabetes mellitus with diabetic peripheral angiopathy without gangrene: Secondary | ICD-10-CM | POA: Diagnosis not present

## 2014-05-11 DIAGNOSIS — M1000 Idiopathic gout, unspecified site: Secondary | ICD-10-CM | POA: Diagnosis not present

## 2014-05-11 DIAGNOSIS — F419 Anxiety disorder, unspecified: Secondary | ICD-10-CM | POA: Diagnosis not present

## 2014-05-11 DIAGNOSIS — I1310 Hypertensive heart and chronic kidney disease without heart failure, with stage 1 through stage 4 chronic kidney disease, or unspecified chronic kidney disease: Secondary | ICD-10-CM | POA: Diagnosis not present

## 2014-05-14 DIAGNOSIS — E1151 Type 2 diabetes mellitus with diabetic peripheral angiopathy without gangrene: Secondary | ICD-10-CM | POA: Diagnosis not present

## 2014-05-14 DIAGNOSIS — M1000 Idiopathic gout, unspecified site: Secondary | ICD-10-CM | POA: Diagnosis not present

## 2014-05-14 DIAGNOSIS — E1122 Type 2 diabetes mellitus with diabetic chronic kidney disease: Secondary | ICD-10-CM | POA: Diagnosis not present

## 2014-05-14 DIAGNOSIS — N183 Chronic kidney disease, stage 3 (moderate): Secondary | ICD-10-CM | POA: Diagnosis not present

## 2014-05-14 DIAGNOSIS — I1310 Hypertensive heart and chronic kidney disease without heart failure, with stage 1 through stage 4 chronic kidney disease, or unspecified chronic kidney disease: Secondary | ICD-10-CM | POA: Diagnosis not present

## 2014-05-14 DIAGNOSIS — F419 Anxiety disorder, unspecified: Secondary | ICD-10-CM | POA: Diagnosis not present

## 2014-05-16 DIAGNOSIS — I1310 Hypertensive heart and chronic kidney disease without heart failure, with stage 1 through stage 4 chronic kidney disease, or unspecified chronic kidney disease: Secondary | ICD-10-CM | POA: Diagnosis not present

## 2014-05-16 DIAGNOSIS — E1151 Type 2 diabetes mellitus with diabetic peripheral angiopathy without gangrene: Secondary | ICD-10-CM | POA: Diagnosis not present

## 2014-05-16 DIAGNOSIS — M1000 Idiopathic gout, unspecified site: Secondary | ICD-10-CM | POA: Diagnosis not present

## 2014-05-16 DIAGNOSIS — E1129 Type 2 diabetes mellitus with other diabetic kidney complication: Secondary | ICD-10-CM | POA: Diagnosis not present

## 2014-05-16 DIAGNOSIS — F419 Anxiety disorder, unspecified: Secondary | ICD-10-CM | POA: Diagnosis not present

## 2014-05-16 DIAGNOSIS — Z7901 Long term (current) use of anticoagulants: Secondary | ICD-10-CM | POA: Diagnosis not present

## 2014-05-16 DIAGNOSIS — N183 Chronic kidney disease, stage 3 (moderate): Secondary | ICD-10-CM | POA: Diagnosis not present

## 2014-05-16 DIAGNOSIS — E1122 Type 2 diabetes mellitus with diabetic chronic kidney disease: Secondary | ICD-10-CM | POA: Diagnosis not present

## 2014-05-16 LAB — PROTIME-INR: INR: 2.4 — AB (ref <=1.1)

## 2014-05-17 ENCOUNTER — Ambulatory Visit (INDEPENDENT_AMBULATORY_CARE_PROVIDER_SITE_OTHER): Payer: Medicare Other | Admitting: Pharmacist Clinician (PhC)/ Clinical Pharmacy Specialist

## 2014-05-17 DIAGNOSIS — Z8672 Personal history of thrombophlebitis: Secondary | ICD-10-CM

## 2014-05-21 DIAGNOSIS — I1310 Hypertensive heart and chronic kidney disease without heart failure, with stage 1 through stage 4 chronic kidney disease, or unspecified chronic kidney disease: Secondary | ICD-10-CM | POA: Diagnosis not present

## 2014-05-21 DIAGNOSIS — N183 Chronic kidney disease, stage 3 (moderate): Secondary | ICD-10-CM | POA: Diagnosis not present

## 2014-05-21 DIAGNOSIS — E1151 Type 2 diabetes mellitus with diabetic peripheral angiopathy without gangrene: Secondary | ICD-10-CM | POA: Diagnosis not present

## 2014-05-21 DIAGNOSIS — M1000 Idiopathic gout, unspecified site: Secondary | ICD-10-CM | POA: Diagnosis not present

## 2014-05-21 DIAGNOSIS — F419 Anxiety disorder, unspecified: Secondary | ICD-10-CM | POA: Diagnosis not present

## 2014-05-21 DIAGNOSIS — E1122 Type 2 diabetes mellitus with diabetic chronic kidney disease: Secondary | ICD-10-CM | POA: Diagnosis not present

## 2014-05-24 DIAGNOSIS — N183 Chronic kidney disease, stage 3 (moderate): Secondary | ICD-10-CM | POA: Diagnosis not present

## 2014-05-24 DIAGNOSIS — M1000 Idiopathic gout, unspecified site: Secondary | ICD-10-CM | POA: Diagnosis not present

## 2014-05-24 DIAGNOSIS — I1310 Hypertensive heart and chronic kidney disease without heart failure, with stage 1 through stage 4 chronic kidney disease, or unspecified chronic kidney disease: Secondary | ICD-10-CM | POA: Diagnosis not present

## 2014-05-24 DIAGNOSIS — F419 Anxiety disorder, unspecified: Secondary | ICD-10-CM | POA: Diagnosis not present

## 2014-05-24 DIAGNOSIS — E1122 Type 2 diabetes mellitus with diabetic chronic kidney disease: Secondary | ICD-10-CM | POA: Diagnosis not present

## 2014-05-24 DIAGNOSIS — E1151 Type 2 diabetes mellitus with diabetic peripheral angiopathy without gangrene: Secondary | ICD-10-CM | POA: Diagnosis not present

## 2014-05-25 DIAGNOSIS — E1122 Type 2 diabetes mellitus with diabetic chronic kidney disease: Secondary | ICD-10-CM | POA: Diagnosis not present

## 2014-05-25 DIAGNOSIS — N183 Chronic kidney disease, stage 3 (moderate): Secondary | ICD-10-CM | POA: Diagnosis not present

## 2014-05-25 DIAGNOSIS — F419 Anxiety disorder, unspecified: Secondary | ICD-10-CM | POA: Diagnosis not present

## 2014-05-25 DIAGNOSIS — I1310 Hypertensive heart and chronic kidney disease without heart failure, with stage 1 through stage 4 chronic kidney disease, or unspecified chronic kidney disease: Secondary | ICD-10-CM | POA: Diagnosis not present

## 2014-05-25 DIAGNOSIS — M1000 Idiopathic gout, unspecified site: Secondary | ICD-10-CM | POA: Diagnosis not present

## 2014-05-25 DIAGNOSIS — E1151 Type 2 diabetes mellitus with diabetic peripheral angiopathy without gangrene: Secondary | ICD-10-CM | POA: Diagnosis not present

## 2014-05-28 DIAGNOSIS — I1310 Hypertensive heart and chronic kidney disease without heart failure, with stage 1 through stage 4 chronic kidney disease, or unspecified chronic kidney disease: Secondary | ICD-10-CM | POA: Diagnosis not present

## 2014-05-28 DIAGNOSIS — M1000 Idiopathic gout, unspecified site: Secondary | ICD-10-CM | POA: Diagnosis not present

## 2014-05-28 DIAGNOSIS — F419 Anxiety disorder, unspecified: Secondary | ICD-10-CM | POA: Diagnosis not present

## 2014-05-28 DIAGNOSIS — N183 Chronic kidney disease, stage 3 (moderate): Secondary | ICD-10-CM | POA: Diagnosis not present

## 2014-05-28 DIAGNOSIS — E1122 Type 2 diabetes mellitus with diabetic chronic kidney disease: Secondary | ICD-10-CM | POA: Diagnosis not present

## 2014-05-28 DIAGNOSIS — E1151 Type 2 diabetes mellitus with diabetic peripheral angiopathy without gangrene: Secondary | ICD-10-CM | POA: Diagnosis not present

## 2014-06-04 DIAGNOSIS — E1122 Type 2 diabetes mellitus with diabetic chronic kidney disease: Secondary | ICD-10-CM | POA: Diagnosis not present

## 2014-06-04 DIAGNOSIS — N183 Chronic kidney disease, stage 3 (moderate): Secondary | ICD-10-CM | POA: Diagnosis not present

## 2014-06-04 DIAGNOSIS — M1000 Idiopathic gout, unspecified site: Secondary | ICD-10-CM | POA: Diagnosis not present

## 2014-06-04 DIAGNOSIS — E1151 Type 2 diabetes mellitus with diabetic peripheral angiopathy without gangrene: Secondary | ICD-10-CM | POA: Diagnosis not present

## 2014-06-04 DIAGNOSIS — F419 Anxiety disorder, unspecified: Secondary | ICD-10-CM | POA: Diagnosis not present

## 2014-06-04 DIAGNOSIS — I1310 Hypertensive heart and chronic kidney disease without heart failure, with stage 1 through stage 4 chronic kidney disease, or unspecified chronic kidney disease: Secondary | ICD-10-CM | POA: Diagnosis not present

## 2014-06-05 DIAGNOSIS — L84 Corns and callosities: Secondary | ICD-10-CM | POA: Diagnosis not present

## 2014-06-05 DIAGNOSIS — L602 Onychogryphosis: Secondary | ICD-10-CM | POA: Diagnosis not present

## 2014-06-05 DIAGNOSIS — E1151 Type 2 diabetes mellitus with diabetic peripheral angiopathy without gangrene: Secondary | ICD-10-CM | POA: Diagnosis not present

## 2014-06-08 ENCOUNTER — Telehealth: Payer: Self-pay | Admitting: Pharmacist Clinician (PhC)/ Clinical Pharmacy Specialist

## 2014-06-08 NOTE — Telephone Encounter (Signed)
Pt called again,he says he need to hear something today please.

## 2014-06-08 NOTE — Telephone Encounter (Signed)
LMOM - advised pt to hold dose Sunday, then again on Wednesday if still on Levaquin.  Would prefer if could check INR Monday, but pt has mobility problems, may not be able to come.  Left message indicating he should call on Monday and if at all possible come for INR.

## 2014-06-08 NOTE — Telephone Encounter (Signed)
Patient states he has been placed on an antibiotic and needs advice on his Coumadin dosage.

## 2014-06-11 DIAGNOSIS — M5136 Other intervertebral disc degeneration, lumbar region: Secondary | ICD-10-CM | POA: Diagnosis not present

## 2014-06-11 DIAGNOSIS — M79652 Pain in left thigh: Secondary | ICD-10-CM | POA: Diagnosis not present

## 2014-06-11 DIAGNOSIS — M79651 Pain in right thigh: Secondary | ICD-10-CM | POA: Diagnosis not present

## 2014-06-13 ENCOUNTER — Other Ambulatory Visit: Payer: Self-pay | Admitting: Pharmacist Clinician (PhC)/ Clinical Pharmacy Specialist

## 2014-06-13 ENCOUNTER — Telehealth: Payer: Self-pay | Admitting: Pharmacist Clinician (PhC)/ Clinical Pharmacy Specialist

## 2014-06-13 NOTE — Telephone Encounter (Signed)
Pt received abx for UTI (levofloxacin).  Has not yet taken, concerned because MD told him to hold warfarin x 2 days when started.  Received abx last week, has not felt bad, not sure he really has infection.  Advised him that if he is to start, we would need to check INR on day 3 or 4.  Pt voiced understanding.  Will call if he decides to take

## 2014-07-05 DIAGNOSIS — N183 Chronic kidney disease, stage 3 (moderate): Secondary | ICD-10-CM | POA: Diagnosis not present

## 2014-07-05 DIAGNOSIS — Z7901 Long term (current) use of anticoagulants: Secondary | ICD-10-CM | POA: Diagnosis not present

## 2014-07-05 DIAGNOSIS — F329 Major depressive disorder, single episode, unspecified: Secondary | ICD-10-CM | POA: Diagnosis not present

## 2014-07-05 DIAGNOSIS — M6281 Muscle weakness (generalized): Secondary | ICD-10-CM | POA: Diagnosis not present

## 2014-07-05 DIAGNOSIS — Z86718 Personal history of other venous thrombosis and embolism: Secondary | ICD-10-CM | POA: Diagnosis not present

## 2014-07-05 DIAGNOSIS — I1310 Hypertensive heart and chronic kidney disease without heart failure, with stage 1 through stage 4 chronic kidney disease, or unspecified chronic kidney disease: Secondary | ICD-10-CM | POA: Diagnosis not present

## 2014-07-05 DIAGNOSIS — Z794 Long term (current) use of insulin: Secondary | ICD-10-CM | POA: Diagnosis not present

## 2014-07-05 DIAGNOSIS — I2510 Atherosclerotic heart disease of native coronary artery without angina pectoris: Secondary | ICD-10-CM | POA: Diagnosis not present

## 2014-07-05 DIAGNOSIS — Z9181 History of falling: Secondary | ICD-10-CM | POA: Diagnosis not present

## 2014-07-05 DIAGNOSIS — F419 Anxiety disorder, unspecified: Secondary | ICD-10-CM | POA: Diagnosis not present

## 2014-07-05 DIAGNOSIS — Z89511 Acquired absence of right leg below knee: Secondary | ICD-10-CM | POA: Diagnosis not present

## 2014-07-05 DIAGNOSIS — M79652 Pain in left thigh: Secondary | ICD-10-CM | POA: Diagnosis not present

## 2014-07-05 DIAGNOSIS — E669 Obesity, unspecified: Secondary | ICD-10-CM | POA: Diagnosis not present

## 2014-07-05 DIAGNOSIS — E1122 Type 2 diabetes mellitus with diabetic chronic kidney disease: Secondary | ICD-10-CM | POA: Diagnosis not present

## 2014-07-13 DIAGNOSIS — N390 Urinary tract infection, site not specified: Secondary | ICD-10-CM | POA: Diagnosis not present

## 2014-07-13 DIAGNOSIS — C61 Malignant neoplasm of prostate: Secondary | ICD-10-CM | POA: Diagnosis not present

## 2014-07-19 DIAGNOSIS — I1310 Hypertensive heart and chronic kidney disease without heart failure, with stage 1 through stage 4 chronic kidney disease, or unspecified chronic kidney disease: Secondary | ICD-10-CM | POA: Diagnosis not present

## 2014-07-19 DIAGNOSIS — N183 Chronic kidney disease, stage 3 (moderate): Secondary | ICD-10-CM | POA: Diagnosis not present

## 2014-07-19 DIAGNOSIS — M79652 Pain in left thigh: Secondary | ICD-10-CM | POA: Diagnosis not present

## 2014-07-19 DIAGNOSIS — I2510 Atherosclerotic heart disease of native coronary artery without angina pectoris: Secondary | ICD-10-CM | POA: Diagnosis not present

## 2014-07-19 DIAGNOSIS — E113599 Type 2 diabetes mellitus with proliferative diabetic retinopathy without macular edema, unspecified eye: Secondary | ICD-10-CM | POA: Insufficient documentation

## 2014-07-19 DIAGNOSIS — E1122 Type 2 diabetes mellitus with diabetic chronic kidney disease: Secondary | ICD-10-CM | POA: Diagnosis not present

## 2014-07-19 DIAGNOSIS — E11359 Type 2 diabetes mellitus with proliferative diabetic retinopathy without macular edema: Secondary | ICD-10-CM | POA: Diagnosis not present

## 2014-07-19 DIAGNOSIS — H26493 Other secondary cataract, bilateral: Secondary | ICD-10-CM | POA: Diagnosis not present

## 2014-07-19 DIAGNOSIS — M6281 Muscle weakness (generalized): Secondary | ICD-10-CM | POA: Diagnosis not present

## 2014-07-24 ENCOUNTER — Ambulatory Visit (INDEPENDENT_AMBULATORY_CARE_PROVIDER_SITE_OTHER): Payer: Medicare Other | Admitting: *Deleted

## 2014-07-24 DIAGNOSIS — Z8672 Personal history of thrombophlebitis: Secondary | ICD-10-CM | POA: Diagnosis not present

## 2014-07-24 DIAGNOSIS — Z7901 Long term (current) use of anticoagulants: Secondary | ICD-10-CM | POA: Diagnosis not present

## 2014-07-24 LAB — POCT INR: INR: 2.3

## 2014-07-26 DIAGNOSIS — M6281 Muscle weakness (generalized): Secondary | ICD-10-CM | POA: Diagnosis not present

## 2014-07-26 DIAGNOSIS — I1310 Hypertensive heart and chronic kidney disease without heart failure, with stage 1 through stage 4 chronic kidney disease, or unspecified chronic kidney disease: Secondary | ICD-10-CM | POA: Diagnosis not present

## 2014-07-26 DIAGNOSIS — N183 Chronic kidney disease, stage 3 (moderate): Secondary | ICD-10-CM | POA: Diagnosis not present

## 2014-07-26 DIAGNOSIS — E1122 Type 2 diabetes mellitus with diabetic chronic kidney disease: Secondary | ICD-10-CM | POA: Diagnosis not present

## 2014-07-26 DIAGNOSIS — M79652 Pain in left thigh: Secondary | ICD-10-CM | POA: Diagnosis not present

## 2014-07-26 DIAGNOSIS — I2510 Atherosclerotic heart disease of native coronary artery without angina pectoris: Secondary | ICD-10-CM | POA: Diagnosis not present

## 2014-07-31 DIAGNOSIS — C61 Malignant neoplasm of prostate: Secondary | ICD-10-CM | POA: Diagnosis not present

## 2014-07-31 DIAGNOSIS — N390 Urinary tract infection, site not specified: Secondary | ICD-10-CM | POA: Diagnosis not present

## 2014-08-01 ENCOUNTER — Other Ambulatory Visit: Payer: Self-pay | Admitting: Cardiovascular Disease

## 2014-08-02 DIAGNOSIS — N183 Chronic kidney disease, stage 3 (moderate): Secondary | ICD-10-CM | POA: Diagnosis not present

## 2014-08-02 DIAGNOSIS — E1122 Type 2 diabetes mellitus with diabetic chronic kidney disease: Secondary | ICD-10-CM | POA: Diagnosis not present

## 2014-08-02 DIAGNOSIS — M6281 Muscle weakness (generalized): Secondary | ICD-10-CM | POA: Diagnosis not present

## 2014-08-02 DIAGNOSIS — I1310 Hypertensive heart and chronic kidney disease without heart failure, with stage 1 through stage 4 chronic kidney disease, or unspecified chronic kidney disease: Secondary | ICD-10-CM | POA: Diagnosis not present

## 2014-08-02 DIAGNOSIS — I2510 Atherosclerotic heart disease of native coronary artery without angina pectoris: Secondary | ICD-10-CM | POA: Diagnosis not present

## 2014-08-02 DIAGNOSIS — M79652 Pain in left thigh: Secondary | ICD-10-CM | POA: Diagnosis not present

## 2014-08-09 DIAGNOSIS — N183 Chronic kidney disease, stage 3 (moderate): Secondary | ICD-10-CM | POA: Diagnosis not present

## 2014-08-09 DIAGNOSIS — I2510 Atherosclerotic heart disease of native coronary artery without angina pectoris: Secondary | ICD-10-CM | POA: Diagnosis not present

## 2014-08-09 DIAGNOSIS — I1310 Hypertensive heart and chronic kidney disease without heart failure, with stage 1 through stage 4 chronic kidney disease, or unspecified chronic kidney disease: Secondary | ICD-10-CM | POA: Diagnosis not present

## 2014-08-09 DIAGNOSIS — M79652 Pain in left thigh: Secondary | ICD-10-CM | POA: Diagnosis not present

## 2014-08-09 DIAGNOSIS — M6281 Muscle weakness (generalized): Secondary | ICD-10-CM | POA: Diagnosis not present

## 2014-08-09 DIAGNOSIS — E1122 Type 2 diabetes mellitus with diabetic chronic kidney disease: Secondary | ICD-10-CM | POA: Diagnosis not present

## 2014-08-16 DIAGNOSIS — E1122 Type 2 diabetes mellitus with diabetic chronic kidney disease: Secondary | ICD-10-CM | POA: Diagnosis not present

## 2014-08-16 DIAGNOSIS — M6281 Muscle weakness (generalized): Secondary | ICD-10-CM | POA: Diagnosis not present

## 2014-08-16 DIAGNOSIS — I2510 Atherosclerotic heart disease of native coronary artery without angina pectoris: Secondary | ICD-10-CM | POA: Diagnosis not present

## 2014-08-16 DIAGNOSIS — I1310 Hypertensive heart and chronic kidney disease without heart failure, with stage 1 through stage 4 chronic kidney disease, or unspecified chronic kidney disease: Secondary | ICD-10-CM | POA: Diagnosis not present

## 2014-08-16 DIAGNOSIS — N183 Chronic kidney disease, stage 3 (moderate): Secondary | ICD-10-CM | POA: Diagnosis not present

## 2014-08-16 DIAGNOSIS — M79652 Pain in left thigh: Secondary | ICD-10-CM | POA: Diagnosis not present

## 2014-08-17 DIAGNOSIS — L602 Onychogryphosis: Secondary | ICD-10-CM | POA: Diagnosis not present

## 2014-08-17 DIAGNOSIS — E1151 Type 2 diabetes mellitus with diabetic peripheral angiopathy without gangrene: Secondary | ICD-10-CM | POA: Diagnosis not present

## 2014-08-23 DIAGNOSIS — I2510 Atherosclerotic heart disease of native coronary artery without angina pectoris: Secondary | ICD-10-CM | POA: Diagnosis not present

## 2014-08-23 DIAGNOSIS — M79652 Pain in left thigh: Secondary | ICD-10-CM | POA: Diagnosis not present

## 2014-08-23 DIAGNOSIS — N183 Chronic kidney disease, stage 3 (moderate): Secondary | ICD-10-CM | POA: Diagnosis not present

## 2014-08-23 DIAGNOSIS — M6281 Muscle weakness (generalized): Secondary | ICD-10-CM | POA: Diagnosis not present

## 2014-08-23 DIAGNOSIS — I1310 Hypertensive heart and chronic kidney disease without heart failure, with stage 1 through stage 4 chronic kidney disease, or unspecified chronic kidney disease: Secondary | ICD-10-CM | POA: Diagnosis not present

## 2014-08-23 DIAGNOSIS — E1122 Type 2 diabetes mellitus with diabetic chronic kidney disease: Secondary | ICD-10-CM | POA: Diagnosis not present

## 2014-08-30 DIAGNOSIS — E559 Vitamin D deficiency, unspecified: Secondary | ICD-10-CM | POA: Diagnosis not present

## 2014-08-30 DIAGNOSIS — N183 Chronic kidney disease, stage 3 (moderate): Secondary | ICD-10-CM | POA: Diagnosis not present

## 2014-08-30 DIAGNOSIS — M79652 Pain in left thigh: Secondary | ICD-10-CM | POA: Diagnosis not present

## 2014-08-30 DIAGNOSIS — I2510 Atherosclerotic heart disease of native coronary artery without angina pectoris: Secondary | ICD-10-CM | POA: Diagnosis not present

## 2014-08-30 DIAGNOSIS — E782 Mixed hyperlipidemia: Secondary | ICD-10-CM | POA: Diagnosis not present

## 2014-08-30 DIAGNOSIS — M6281 Muscle weakness (generalized): Secondary | ICD-10-CM | POA: Diagnosis not present

## 2014-08-30 DIAGNOSIS — E790 Hyperuricemia without signs of inflammatory arthritis and tophaceous disease: Secondary | ICD-10-CM | POA: Diagnosis not present

## 2014-08-30 DIAGNOSIS — I1310 Hypertensive heart and chronic kidney disease without heart failure, with stage 1 through stage 4 chronic kidney disease, or unspecified chronic kidney disease: Secondary | ICD-10-CM | POA: Diagnosis not present

## 2014-08-30 DIAGNOSIS — E1122 Type 2 diabetes mellitus with diabetic chronic kidney disease: Secondary | ICD-10-CM | POA: Diagnosis not present

## 2014-08-30 DIAGNOSIS — E1165 Type 2 diabetes mellitus with hyperglycemia: Secondary | ICD-10-CM | POA: Diagnosis not present

## 2014-09-04 DIAGNOSIS — Z794 Long term (current) use of insulin: Secondary | ICD-10-CM | POA: Diagnosis not present

## 2014-09-04 DIAGNOSIS — Z9641 Presence of insulin pump (external) (internal): Secondary | ICD-10-CM | POA: Diagnosis not present

## 2014-09-04 DIAGNOSIS — E782 Mixed hyperlipidemia: Secondary | ICD-10-CM | POA: Diagnosis not present

## 2014-09-04 DIAGNOSIS — E1165 Type 2 diabetes mellitus with hyperglycemia: Secondary | ICD-10-CM | POA: Diagnosis not present

## 2014-09-04 DIAGNOSIS — I10 Essential (primary) hypertension: Secondary | ICD-10-CM | POA: Diagnosis not present

## 2014-09-04 DIAGNOSIS — E790 Hyperuricemia without signs of inflammatory arthritis and tophaceous disease: Secondary | ICD-10-CM | POA: Diagnosis not present

## 2014-09-04 DIAGNOSIS — Z89511 Acquired absence of right leg below knee: Secondary | ICD-10-CM | POA: Diagnosis not present

## 2014-09-04 DIAGNOSIS — E1142 Type 2 diabetes mellitus with diabetic polyneuropathy: Secondary | ICD-10-CM | POA: Diagnosis not present

## 2014-09-06 ENCOUNTER — Ambulatory Visit: Payer: Medicare Other | Admitting: Pharmacist Clinician (PhC)/ Clinical Pharmacy Specialist

## 2014-09-14 ENCOUNTER — Ambulatory Visit (INDEPENDENT_AMBULATORY_CARE_PROVIDER_SITE_OTHER): Payer: Medicare Other | Admitting: Pharmacist Clinician (PhC)/ Clinical Pharmacy Specialist

## 2014-09-14 DIAGNOSIS — Z7901 Long term (current) use of anticoagulants: Secondary | ICD-10-CM | POA: Diagnosis not present

## 2014-09-14 DIAGNOSIS — Z8672 Personal history of thrombophlebitis: Secondary | ICD-10-CM

## 2014-09-14 LAB — POCT INR: INR: 2.8

## 2014-09-29 DIAGNOSIS — M1000 Idiopathic gout, unspecified site: Secondary | ICD-10-CM | POA: Diagnosis not present

## 2014-09-29 DIAGNOSIS — I1310 Hypertensive heart and chronic kidney disease without heart failure, with stage 1 through stage 4 chronic kidney disease, or unspecified chronic kidney disease: Secondary | ICD-10-CM | POA: Diagnosis not present

## 2014-09-29 DIAGNOSIS — E1122 Type 2 diabetes mellitus with diabetic chronic kidney disease: Secondary | ICD-10-CM | POA: Diagnosis not present

## 2014-09-29 DIAGNOSIS — N401 Enlarged prostate with lower urinary tract symptoms: Secondary | ICD-10-CM | POA: Diagnosis not present

## 2014-09-29 DIAGNOSIS — I5030 Unspecified diastolic (congestive) heart failure: Secondary | ICD-10-CM | POA: Diagnosis not present

## 2014-09-29 DIAGNOSIS — F331 Major depressive disorder, recurrent, moderate: Secondary | ICD-10-CM | POA: Diagnosis not present

## 2014-09-29 DIAGNOSIS — R197 Diarrhea, unspecified: Secondary | ICD-10-CM | POA: Diagnosis not present

## 2014-10-09 ENCOUNTER — Encounter: Payer: Self-pay | Admitting: Cardiovascular Disease

## 2014-10-25 DIAGNOSIS — L602 Onychogryphosis: Secondary | ICD-10-CM | POA: Diagnosis not present

## 2014-10-25 DIAGNOSIS — E1151 Type 2 diabetes mellitus with diabetic peripheral angiopathy without gangrene: Secondary | ICD-10-CM | POA: Diagnosis not present

## 2014-10-26 ENCOUNTER — Ambulatory Visit (INDEPENDENT_AMBULATORY_CARE_PROVIDER_SITE_OTHER): Payer: Medicare Other | Admitting: Pharmacist Clinician (PhC)/ Clinical Pharmacy Specialist

## 2014-10-26 DIAGNOSIS — Z7901 Long term (current) use of anticoagulants: Secondary | ICD-10-CM | POA: Diagnosis not present

## 2014-10-26 DIAGNOSIS — Z8672 Personal history of thrombophlebitis: Secondary | ICD-10-CM

## 2014-10-26 LAB — POCT INR: INR: 2.4

## 2014-11-28 ENCOUNTER — Encounter: Payer: Self-pay | Admitting: Family

## 2014-11-30 ENCOUNTER — Encounter (HOSPITAL_COMMUNITY): Payer: Medicare Other

## 2014-11-30 ENCOUNTER — Ambulatory Visit (HOSPITAL_COMMUNITY)
Admission: RE | Admit: 2014-11-30 | Discharge: 2014-11-30 | Disposition: A | Discharge status: Home / Self Care | Payer: Medicare Other | Source: Ambulatory Visit | Attending: Family | Admitting: Family

## 2014-11-30 ENCOUNTER — Other Ambulatory Visit: Payer: Self-pay | Admitting: Family

## 2014-11-30 ENCOUNTER — Ambulatory Visit: Payer: Medicare Other | Admitting: Family

## 2014-11-30 ENCOUNTER — Encounter: Payer: Self-pay | Admitting: Family

## 2014-11-30 ENCOUNTER — Ambulatory Visit (INDEPENDENT_AMBULATORY_CARE_PROVIDER_SITE_OTHER): Payer: Medicare Other | Admitting: Family

## 2014-11-30 VITALS — BP 116/66 | HR 66 | Temp 97.4°F | Resp 14 | Ht 71.0 in | Wt 260.0 lb

## 2014-11-30 DIAGNOSIS — Z89511 Acquired absence of right leg below knee: Secondary | ICD-10-CM | POA: Diagnosis not present

## 2014-11-30 DIAGNOSIS — I739 Peripheral vascular disease, unspecified: Secondary | ICD-10-CM | POA: Diagnosis not present

## 2014-11-30 DIAGNOSIS — Z87891 Personal history of nicotine dependence: Secondary | ICD-10-CM

## 2014-11-30 DIAGNOSIS — I10 Essential (primary) hypertension: Secondary | ICD-10-CM | POA: Insufficient documentation

## 2014-11-30 DIAGNOSIS — I998 Other disorder of circulatory system: Secondary | ICD-10-CM | POA: Insufficient documentation

## 2014-11-30 DIAGNOSIS — E119 Type 2 diabetes mellitus without complications: Secondary | ICD-10-CM | POA: Diagnosis not present

## 2014-11-30 DIAGNOSIS — E785 Hyperlipidemia, unspecified: Secondary | ICD-10-CM | POA: Diagnosis not present

## 2014-11-30 DIAGNOSIS — Z48812 Encounter for surgical aftercare following surgery on the circulatory system: Secondary | ICD-10-CM | POA: Diagnosis not present

## 2014-11-30 DIAGNOSIS — I70229 Atherosclerosis of native arteries of extremities with rest pain, unspecified extremity: Secondary | ICD-10-CM

## 2014-11-30 DIAGNOSIS — E1159 Type 2 diabetes mellitus with other circulatory complications: Secondary | ICD-10-CM | POA: Diagnosis not present

## 2014-11-30 DIAGNOSIS — E1151 Type 2 diabetes mellitus with diabetic peripheral angiopathy without gangrene: Secondary | ICD-10-CM

## 2014-11-30 NOTE — Progress Notes (Signed)
[facility] HISTORY [facility] PHYSICAL -PAD  History of Present Illness Patrick Shelton is a 74 y.o. male patient that Dr. Bridgett Larsson has been following for PAD, he returns today for follow up.  His sx include B hand and left foot paraesthesia and anesthesia. The patient's treatment regimen currently included: maximal medical management.  Pt denies claudication symptoms in LLE, he had difficulty feeling his left foot brake pedal while driving, his Lucianne Lei is fitted for hand control of brake and accelerator.  Pt denies non-healing wounds.  Pt denies history of stroke or TIA.  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   Pt reports seeing his PCP since he was here last, has gout in both hands. 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  He has a treadmill at home, is using it, states he now has a better fitting prosthesis.  Pt admits to lacking enough motivation to better control his DM, is tired, no energy. He states that he tries not to sleep due to bad dreams, states he has PTSD, and has thought about getting help from the [facility]; I encouraged him to follow up on this.  He has been having diarrhea for the last couple of months.  He has imporoved his diet.  He is not walking much due to left LE gout and arthritis pain.   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  Pt Diabetic: Yes, states last A1C was 7.?, he uses an insulin pump.  Pt smoker: former smoker, quit in 1979   Pt meds include:  Statin :Yes  ASA: No  Other anticoagulants/antiplatelets: coumadin      Past Medical History  Diagnosis Date  . Hypertension   . Diabetes mellitus   . PVD (peripheral vascular disease)   . S/P BKA (below  knee amputation)   . Hyperlipidemia   . PVT (paroxysmal ventricular tachycardia)   . ED (erectile dysfunction)   . Chronic kidney disease   . Cancer     8 wks Radiation  . Hx of agent Orange exposure     while [place] in [place] during that conflict  . 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 Arthritis Sept. 2015    Gout-Right Hand    Social History Social History  Substance Use Topics  . Smoking status: Former Smoker    Quit date: 03/09/1977  . Smokeless tobacco: Never Used  . Alcohol Use: No    Family History Family History  Problem Relation Age of Onset  . Dementia Mother   . Diabetes Father     Amputation- Bilateral leg  . Peripheral vascular disease Father     Past Surgical History  Procedure Laterality Date  . Amputation Right 2009    BKA  . Prostate surgery      8 wks radiation    Allergies  Allergen Reactions  . Prednisone Anaphylaxis and Shortness Of Breath    Current Outpatient Prescriptions  Medication Sig Dispense Refill  . amLODipine (NORVASC) 10 MG tablet Take 10 mg by mouth daily.      . Cholecalciferol (VITAMIN D-3) 5000 UNITS TABS Take 5,000 Units by mouth daily.     Marland Kitchen  colchicine 0.6 MG tablet Take 1 tablet (0.6 mg total) by mouth daily. 12 tablet 0  . Dulaglutide (TRULICITY) 2.11 HE/1.7EY SOPN Inject 0.75 mg into the skin every 7 (seven) days.    Marland Kitchen ezetimibe (ZETIA) 10 MG tablet Take 5 mg by mouth daily.     . hydrochlorothiazide 25 MG tablet Take 25 mg by mouth daily.      Marland Kitchen HYDROcodone-acetaminophen (NORCO/VICODIN) 5-325 MG per tablet Take 1-2 tablets by mouth every 4 (four) hours as needed. 20 tablet 0  . Insulin Human (INSULIN PUMP) 100 unit/ml SOLN Inject into the skin continuous. Max 155 U/day    . lisinopril (PRINIVIL,ZESTRIL) 40 MG tablet Take 40 mg by mouth daily.      . pravastatin (PRAVACHOL) 40 MG tablet Take 40 mg by mouth daily.      Marland Kitchen warfarin (COUMADIN) 2.5 MG tablet TAKE 1 TABLET DAILY OR AS DIRECTED BY COUMADIN  CLINIC 90 tablet 0   No current facility-administered medications for this visit.    ROS: See HPI for pertinent positives and negatives.   Physical Examination  Filed Vitals:   11/30/14 1523  BP: 116/66  Pulse: 66  Temp: 97.4 F (36.3 C)  TempSrc: Oral  Resp: 14  Height: 5\' 11"  (1.803 m)  Weight: 260 lb (117.935 kg)  SpO2: 98%   Body mass index is 36.28 kg/(m^2).   General: A&O x 3, WDWN, obese male.  Gait: slow and deliberate, using cane Eyes: Pupils equal  Pulmonary: CTAB, without wheezes , rales or rhonchi  Cardiac: regular Rythm , without murmur   Carotid Bruits  Left  Right    Negative  Negative   Aorta is not palpable Radial pulses: 1+ palpable bilaterally   VASCULAR EXAM:  Extremities without ischemic changes  without Gangrene; without open wounds. Wearing prosthesis on right BKA.   LE Pulses  LEFT  RIGHT   FEMORAL  palpable  palpable  POPLITEAL  not palpable  not palpable   POSTERIOR TIBIAL  not palpable  BKA   DORSALIS PEDIS  ANTERIOR TIBIAL  not palpable  BKA    Abdomen: soft, NT, no masses palpated.  Skin: no rashes, no ulcers.  Musculoskeletal: no muscle wasting or atrophy, wearing right BKA prosthesis.  Neurologic: A&O X 3; Appropriate Affect ; SENSATION: normal; MOTOR FUNCTION: moving all extremities equally, motor strength 5/5 in UE's, 4/5 in LE's. Speech is fluent/normal. CN 2-12 grossly intact.         Non-Invasive Vascular Imaging: DATE: 11/30/2014 ABI (Date: 11/30/2014)  R: BKA (unobtainable, 11/23/13)  L: N/C (N/C), DP: biphasic, PT: biphasic, TBI: 0.68 (0.44)   ASSESSMENT: Patrick Shelton is a 74 y.o. 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  He is ambulating with his right BKA prosthesis. He has no ulcers on his right BKA stump, no ischemic changes in his left LE. He has no claudication in his left LE. Left LE  arteries are non-compressible due to calcification secondary to chronic hyperglycemia.  His A1C is improving. He has not smoked since 1979. 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   PLAN:  Based on the patient's vascular studies and examination, pt will return to clinic in 1 year for right ABI/TBI.   I discussed in depth with the patient the nature of atherosclerosis, and emphasized the importance of maximal medical management including strict control of blood pressure, blood glucose, and lipid levels,  obtaining regular exercise, and continued cessation of smoking.  The patient is aware that without maximal medical management the underlying atherosclerotic disease process will progress, limiting the benefit of any interventions.  The patient was given information about PAD including signs, symptoms, treatment, what symptoms should prompt the patient to seek immediate medical care, and risk reduction measures to take.  Clemon Chambers, RN, MSN, FNP-C [facility] Phone: [phone]  Clinic MD: Bridgett Larsson on call  11/30/2014 3:19 PM

## 2014-11-30 NOTE — Patient Instructions (Signed)

## 2014-12-03 DIAGNOSIS — E1165 Type 2 diabetes mellitus with hyperglycemia: Secondary | ICD-10-CM | POA: Diagnosis not present

## 2014-12-03 DIAGNOSIS — E782 Mixed hyperlipidemia: Secondary | ICD-10-CM | POA: Diagnosis not present

## 2014-12-03 DIAGNOSIS — E790 Hyperuricemia without signs of inflammatory arthritis and tophaceous disease: Secondary | ICD-10-CM | POA: Diagnosis not present

## 2014-12-06 DIAGNOSIS — I10 Essential (primary) hypertension: Secondary | ICD-10-CM | POA: Diagnosis not present

## 2014-12-06 DIAGNOSIS — Z89511 Acquired absence of right leg below knee: Secondary | ICD-10-CM | POA: Diagnosis not present

## 2014-12-06 DIAGNOSIS — E1142 Type 2 diabetes mellitus with diabetic polyneuropathy: Secondary | ICD-10-CM | POA: Diagnosis not present

## 2014-12-06 DIAGNOSIS — E782 Mixed hyperlipidemia: Secondary | ICD-10-CM | POA: Diagnosis not present

## 2014-12-06 DIAGNOSIS — E790 Hyperuricemia without signs of inflammatory arthritis and tophaceous disease: Secondary | ICD-10-CM | POA: Diagnosis not present

## 2014-12-06 DIAGNOSIS — Z23 Encounter for immunization: Secondary | ICD-10-CM | POA: Diagnosis not present

## 2014-12-06 DIAGNOSIS — E1165 Type 2 diabetes mellitus with hyperglycemia: Secondary | ICD-10-CM | POA: Diagnosis not present

## 2014-12-06 DIAGNOSIS — Z794 Long term (current) use of insulin: Secondary | ICD-10-CM | POA: Diagnosis not present

## 2014-12-06 DIAGNOSIS — N183 Chronic kidney disease, stage 3 (moderate): Secondary | ICD-10-CM | POA: Diagnosis not present

## 2014-12-06 DIAGNOSIS — Z9641 Presence of insulin pump (external) (internal): Secondary | ICD-10-CM | POA: Diagnosis not present

## 2014-12-07 ENCOUNTER — Ambulatory Visit (INDEPENDENT_AMBULATORY_CARE_PROVIDER_SITE_OTHER): Payer: Medicare Other | Admitting: Pharmacist Clinician (PhC)/ Clinical Pharmacy Specialist

## 2014-12-07 DIAGNOSIS — Z7901 Long term (current) use of anticoagulants: Secondary | ICD-10-CM | POA: Diagnosis not present

## 2014-12-07 DIAGNOSIS — Z8672 Personal history of thrombophlebitis: Secondary | ICD-10-CM | POA: Diagnosis not present

## 2014-12-07 LAB — POCT INR: INR: 3.2

## 2014-12-28 ENCOUNTER — Ambulatory Visit (INDEPENDENT_AMBULATORY_CARE_PROVIDER_SITE_OTHER): Payer: Medicare Other | Admitting: Pharmacist Clinician (PhC)/ Clinical Pharmacy Specialist

## 2014-12-28 DIAGNOSIS — Z8672 Personal history of thrombophlebitis: Secondary | ICD-10-CM

## 2014-12-28 DIAGNOSIS — Z7901 Long term (current) use of anticoagulants: Secondary | ICD-10-CM

## 2014-12-28 LAB — POCT INR: INR: 2.2

## 2015-01-25 ENCOUNTER — Encounter: Payer: Self-pay | Admitting: Cardiovascular Disease

## 2015-01-25 ENCOUNTER — Ambulatory Visit (INDEPENDENT_AMBULATORY_CARE_PROVIDER_SITE_OTHER): Payer: Medicare Other | Admitting: Pharmacist Clinician (PhC)/ Clinical Pharmacy Specialist

## 2015-01-25 ENCOUNTER — Ambulatory Visit (INDEPENDENT_AMBULATORY_CARE_PROVIDER_SITE_OTHER): Payer: Medicare Other | Admitting: Cardiovascular Disease

## 2015-01-25 VITALS — BP 128/70 | HR 68 | Ht 71.0 in | Wt 262.7 lb

## 2015-01-25 DIAGNOSIS — E785 Hyperlipidemia, unspecified: Secondary | ICD-10-CM

## 2015-01-25 DIAGNOSIS — I739 Peripheral vascular disease, unspecified: Secondary | ICD-10-CM | POA: Diagnosis not present

## 2015-01-25 DIAGNOSIS — R0602 Shortness of breath: Secondary | ICD-10-CM

## 2015-01-25 DIAGNOSIS — Z7901 Long term (current) use of anticoagulants: Secondary | ICD-10-CM

## 2015-01-25 DIAGNOSIS — R0609 Other forms of dyspnea: Secondary | ICD-10-CM

## 2015-01-25 DIAGNOSIS — R05 Cough: Secondary | ICD-10-CM

## 2015-01-25 DIAGNOSIS — R059 Cough, unspecified: Secondary | ICD-10-CM

## 2015-01-25 DIAGNOSIS — Z89511 Acquired absence of right leg below knee: Secondary | ICD-10-CM

## 2015-01-25 DIAGNOSIS — Z8672 Personal history of thrombophlebitis: Secondary | ICD-10-CM | POA: Diagnosis not present

## 2015-01-25 DIAGNOSIS — I2782 Chronic pulmonary embolism: Secondary | ICD-10-CM

## 2015-01-25 LAB — POCT INR: INR: 2.4

## 2015-01-25 MED ORDER — WARFARIN SODIUM 2.5 MG PO TABS: ORAL_TABLET | ORAL | Status: DC

## 2015-01-25 MED ORDER — VALSARTAN 320 MG PO TABS: 320.0000 mg | ORAL_TABLET | Freq: Every day | ORAL | Status: DC

## 2015-01-25 NOTE — Patient Instructions (Signed)
Your physician has recommended you make the following change in your medication:   STOP LISINOPRIL AND START VALSARTAN 320 MG DAILY.  Your physician has requested that you have an echocardiogram. Echocardiography is a painless test that uses sound waves to create images of your heart. It provides your doctor with information about the size and shape of your heart and how well your heart's chambers and valves are working. This procedure takes approximately one hour. There are no restrictions for this procedure.  A REFERRAL HAS BEEN PLACED FOR A CONSULTATION WITH DR. Melvyn Novas - PULMONARY SPECIALIST.  Dr. Sallyanne Kuster recommends that you schedule a follow-up appointment in: [place]

## 2015-01-25 NOTE — Progress Notes (Signed)
Patient ID: Patrick Shelton, male   DOB: [birth year], 74 y.o.   MRN: [id number]     Cardiology Office Note   Date:  01/26/2015   ID:  Patrick Shelton, DOB 1940-06-19, MRN [id number]  PCP:  Limmie Patricia, MD  Cardiologist:   Sanda Klein, MD   No chief complaint on file.     History of Present Illness: Patrick Shelton is a 74 y.o. 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  He has documented increased coronary calcification on chest CT has never had angina pectoris and had a normal nuclear stress test in 2011.   Patrick Shelton has noticed worsening dyspnea. This happens when he walks less than 100 feet. He denies angina either at rest or with exertion. He has had a cough that has bothered him for years and is getting worse. He describes mucus drainage down the back of his throat. Glycemic control has been improving and his last hemoglobin A1c was almost at goal at 7.2% when checked in September.  He has neuropathy and paresthesias in his left foot in both hands.  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  In the past his ACE inhibitor was stopped to see if this would help his cough , but it had little impact.  Past Medical History  Diagnosis Date  . Hypertension   . Diabetes mellitus   . PVD (peripheral vascular disease) ([place])   . S/P BKA (below knee amputation) ([place])   . Hyperlipidemia   . PVT (paroxysmal ventricular tachycardia) ([place])   . ED (erectile dysfunction)   . Chronic kidney disease   . Cancer (HCC)     8 wks Radiation  . Hx of agent Orange exposure     while [place] in [place] during that conflict  . 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 Arthritis Sept. 2015    Gout-Right Hand  Left knee    Past  Surgical History  Procedure Laterality Date  . Amputation Right 2009    BKA  . Prostate surgery      8 wks radiation     Current Outpatient Prescriptions  Medication Sig Dispense Refill  . amLODipine (NORVASC) 10 MG tablet Take 10 mg by mouth daily.      . Cholecalciferol (VITAMIN D-3) 5000 UNITS TABS Take 5,000 Units by mouth daily.     . colchicine 0.6 MG tablet Take 1 tablet (0.6 mg total) by mouth daily. (Patient taking differently: Take 0.6 mg by mouth as needed. ) 12 tablet 0  . ezetimibe (ZETIA) 10 MG tablet Take 5 mg by mouth daily.     . hydrochlorothiazide 25 MG tablet Take 25 mg by mouth daily.      Marland Kitchen HYDROcodone-acetaminophen (NORCO/VICODIN) 5-325 MG per tablet Take 1-2 tablets by mouth every 4 (four) hours as needed. 20 tablet 0  . Insulin Human (INSULIN PUMP) 100 unit/ml SOLN Inject into the skin continuous. Max 155 U/day    . pravastatin (PRAVACHOL) 40 MG tablet Take 40 mg by mouth daily.      . valsartan (DIOVAN) 320 MG tablet Take 1 tablet (320 mg total) by mouth daily. 90 tablet 3  . warfarin (COUMADIN) 2.5 MG tablet TAKE 1 TABLET DAILY OR AS DIRECTED BY COUMADIN CLINIC 90 tablet 1  No current facility-administered medications for this visit.    Allergies:   Prednisone    Social History:  The patient  reports that he quit smoking about 37 years ago. He has never used smokeless tobacco. He reports that he does not drink alcohol or use illicit drugs.   Family History:  The patient's family history includes Dementia in his mother; Diabetes in his father; Peripheral vascular disease in his father.    ROS:  Please see the history of present illness.    Otherwise, review of systems positive for none.   All other systems are reviewed and negative.    PHYSICAL EXAM: VS:  BP 128/70 mmHg  Pulse 68  Ht 5\' 11"  (1.803 m)  Wt 262 lb 11.2 oz (119.16 kg)  BMI 36.66 kg/m2 , BMI Body mass index is 36.66 kg/(m^2).  General: Alert, oriented x3, no distress Head: no  evidence of trauma, PERRL, EOMI, no exophtalmos or lid lag, no myxedema, no xanthelasma; normal ears, nose and oropharynx Neck: normal jugular venous pulsations and no hepatojugular reflux; brisk carotid pulses without delay and no carotid bruits Chest: clear to auscultation, no signs of consolidation by percussion or palpation, normal fremitus, symmetrical and full respiratory excursions Cardiovascular: normal position and quality of the apical impulse, regular rhythm, normal first and second heart sounds, no murmurs, rubs or gallops Abdomen: no tenderness or distention, no masses by palpation, no abnormal pulsatility or arterial bruits, normal bowel sounds, no hepatosplenomegaly Extremities: no clubbing, cyanosis or edema; 2+ radial, ulnar and brachial pulses bilaterally; s/p R BKA; 2+ left femoral, posterior tibial and dorsalis pedis pulses; no subclavian or femoral bruits Neurological: grossly nonfocal Psych: euthymic mood, full affect   EKG:  EKG is ordered today. The ekg ordered today demonstrates  Normal sinus rhythm, left axis deviation, chronic nonspecific T-wave inversion in multiple leads.   Recent Labs: 04/03/2014: ALT 26; BUN 62*; Creatinine, Ser 2.11*; Hemoglobin 11.2*; Platelets 435*; Potassium 4.2; Sodium 137    Lipid Panel    Component Value Date/Time   CHOL 156 05/24/2010 0134   TRIG 250* 05/24/2010 0134   HDL 28* 05/24/2010 0134   CHOLHDL 5.6 Ratio 05/24/2010 0134   VLDL 50* 05/24/2010 0134   LDLCALC 78 05/24/2010 0134      Wt Readings from Last 3 Encounters:  01/25/15 262 lb 11.2 oz (119.16 kg)  11/30/14 260 lb (117.935 kg)  03/26/14 250 lb (113.399 kg)      ASSESSMENT AND PLAN:  1.  Heavy coronary calcification on chest CT, with normal previous functional study. He remains asymptomatic, but is also quite sedentary.    2. Exertional dyspnea He believes his shortness of breath is worse and have recommended that he have a repeat echocardiogram. If left  ventricular systolic function has decreased, he should probably just have coronary angiography rather than repeating a nuclear stress test. He is also at risk of having diastolic dysfunction from left ventricular hypertrophy related to hypertension,  As well as pulmonary artery hypertension due to chronic venous thromboembolic disease. Hopefully echo will give [place] a hint as to the right direction for further investigation.  3.  Diabetes mellitus with almost good control.  He has had rather brittle diabetes control with previous problems with both hypoglycemia and hyperglycemia. He behaves more like an insulin sensitive, type I diabetic. He currently is being treated with an insulin pump  4.  Hyperlipidemia on combination statin and Zetia therapy. I don't have a recent lipid profile but this was performed  both by Dr. Eden Emms and the [facility]. [place] try to get mother copies.  5.  Essential hypertension, well controlled  6.  PAD status post right below the knee amputation  7.  History of recurrent pulmonary embolism on lifelong anticoagulation.  8.  Chronic cough. I have recommended that we discontinued lisinopril permanently and substitute an angiotensin receptor blocker instead.  I don't think his cough is due to congestive heart failure, but the echo should be helpful to exclude this. Refer to Dr. Melvyn Novas for evaluation of chronic cough.    Current medicines are reviewed at length with the patient today.  The patient does not have concerns regarding medicines.  The following changes have been made:   Stop lisinopril , start valsartan 320 mg daily  Labs/ tests ordered today include:  Orders Placed This Encounter  Procedures  . Ambulatory referral to Pulmonology  . EKG 12-Lead  . ECHOCARDIOGRAM COMPLETE   Patient Instructions  Your physician has recommended you make the following change in your medication:   STOP LISINOPRIL AND START VALSARTAN 320 MG DAILY.  Your physician has  requested that you have an echocardiogram. Echocardiography is a painless test that uses sound waves to create images of your heart. It provides your doctor with information about the size and shape of your heart and how well your heart's chambers and valves are working. This procedure takes approximately one hour. There are no restrictions for this procedure.  A REFERRAL HAS BEEN PLACED FOR A CONSULTATION WITH DR. Melvyn Novas - PULMONARY SPECIALIST.  Dr. Sallyanne Kuster recommends that you schedule a follow-up appointment in: ONE YEAR     SignedSanda Klein, MD  01/26/2015 6:23 PM    Sanda Klein, MD, [facility] [phone] office [phone] pager

## 2015-01-26 ENCOUNTER — Encounter: Payer: Self-pay | Admitting: Internal Medicine

## 2015-01-26 DIAGNOSIS — R053 Chronic cough: Secondary | ICD-10-CM | POA: Insufficient documentation

## 2015-01-26 DIAGNOSIS — R05 Cough: Secondary | ICD-10-CM | POA: Insufficient documentation

## 2015-01-29 DIAGNOSIS — E1351 Other specified diabetes mellitus with diabetic peripheral angiopathy without gangrene: Secondary | ICD-10-CM | POA: Diagnosis not present

## 2015-01-29 DIAGNOSIS — L602 Onychogryphosis: Secondary | ICD-10-CM | POA: Diagnosis not present

## 2015-01-29 DIAGNOSIS — I70293 Other atherosclerosis of native arteries of extremities, bilateral legs: Secondary | ICD-10-CM | POA: Diagnosis not present

## 2015-02-08 ENCOUNTER — Other Ambulatory Visit: Payer: Self-pay

## 2015-02-08 ENCOUNTER — Ambulatory Visit (HOSPITAL_COMMUNITY): Payer: Medicare Other | Attending: Cardiovascular Disease

## 2015-02-08 ENCOUNTER — Ambulatory Visit: Payer: Medicare Other | Admitting: Pharmacist Clinician (PhC)/ Clinical Pharmacy Specialist

## 2015-02-08 DIAGNOSIS — Z87891 Personal history of nicotine dependence: Secondary | ICD-10-CM | POA: Diagnosis not present

## 2015-02-08 DIAGNOSIS — I071 Rheumatic tricuspid insufficiency: Secondary | ICD-10-CM | POA: Diagnosis not present

## 2015-02-08 DIAGNOSIS — R0602 Shortness of breath: Secondary | ICD-10-CM | POA: Insufficient documentation

## 2015-02-08 DIAGNOSIS — I517 Cardiomegaly: Secondary | ICD-10-CM | POA: Insufficient documentation

## 2015-02-08 DIAGNOSIS — I77810 Thoracic aortic ectasia: Secondary | ICD-10-CM | POA: Diagnosis not present

## 2015-02-08 DIAGNOSIS — R0600 Dyspnea, unspecified: Secondary | ICD-10-CM | POA: Diagnosis present

## 2015-02-08 DIAGNOSIS — E119 Type 2 diabetes mellitus without complications: Secondary | ICD-10-CM | POA: Insufficient documentation

## 2015-02-08 DIAGNOSIS — E785 Hyperlipidemia, unspecified: Secondary | ICD-10-CM | POA: Diagnosis not present

## 2015-02-08 DIAGNOSIS — I371 Nonrheumatic pulmonary valve insufficiency: Secondary | ICD-10-CM | POA: Insufficient documentation

## 2015-02-13 DIAGNOSIS — H26491 Other secondary cataract, right eye: Secondary | ICD-10-CM | POA: Diagnosis not present

## 2015-02-18 ENCOUNTER — Ambulatory Visit (INDEPENDENT_AMBULATORY_CARE_PROVIDER_SITE_OTHER): Payer: Medicare Other | Admitting: Internal Medicine

## 2015-02-18 ENCOUNTER — Encounter: Payer: Self-pay | Admitting: Internal Medicine

## 2015-02-18 VITALS — BP 118/80 | HR 75 | Ht 71.0 in | Wt 261.4 lb

## 2015-02-18 DIAGNOSIS — R05 Cough: Secondary | ICD-10-CM | POA: Diagnosis not present

## 2015-02-18 DIAGNOSIS — I10 Essential (primary) hypertension: Secondary | ICD-10-CM | POA: Diagnosis not present

## 2015-02-18 DIAGNOSIS — R053 Chronic cough: Secondary | ICD-10-CM

## 2015-02-18 MED ORDER — PANTOPRAZOLE SODIUM 40 MG PO TBEC: 40.0000 mg | DELAYED_RELEASE_TABLET | Freq: Every day | ORAL | Status: DC

## 2015-02-18 MED ORDER — FAMOTIDINE 20 MG PO TABS: ORAL_TABLET | ORAL | Status: DC

## 2015-02-18 NOTE — Patient Instructions (Addendum)
The key to effective treatment for your cough is eliminating the non-stop cycle of cough you're stuck in long enough to let your airway heal completely and then see if there is anything still making you cough once you stop the cough suppression, but this should take no more than 5 days to figure out   Protonix (pantoprazole) Take 30-60 min before first meal of the day and Pepcid 20 mg one bedtime plus chlorpheniramine 4 mg x 2 at bedtime (both available over the counter)  until cough is completely gone for at least a week without the need for cough suppression  GERD (REFLUX)  is an extremely common cause of respiratory symptoms, many times with no significant heartburn at all.    It can be treated with medication, but also with lifestyle changes including avoidance of late meals, excessive alcohol, smoking cessation, and avoid fatty foods, chocolate, peppermint, colas, red wine, and acidic juices such as orange juice.  NO MINT OR MENTHOL PRODUCTS SO NO COUGH DROPS  USE HARD CANDY INSTEAD (jolley ranchers or Stover's or Lifesavers (all available in sugarless versions) NO OIL BASED VITAMINS - use powdered substitutes.  Start the new blood pressure pill today (Diovan 320)  I will call with additonal recommendations after I see Dr Theressa Millard notes  Please remember to go to the   x-ray department downstairs for your tests - we will call you with the results when they are available.     Please schedule a follow up office visit in 4 weeks, sooner if needed

## 2015-02-18 NOTE — Progress Notes (Addendum)
Subjective:    Patient ID: Patrick Shelton, male    DOB: 1941-02-10, MRN: [id number]  HPI  74 yobm quit smoking 1979 no resp problems with abruptly 2010 onset daily cough referred to pulmonary clinic 02/18/2015 by Dr Sallyanne Kuster   02/18/2015 1st [facility] office visit/ [facility]   Chief Complaint  Patient presents with  . Pulmonary Consult    Referred by Dr. Sallyanne Kuster. Pt c/o cough x 6 yrs- started after dx of PE. Cough is prod with thick, clear sputum and is esp worse when he lies down. He also c/o SOB- sometimes at rest and other times when walking short distance on flat surface.   abruptly  3 weeks p R Bka  Then dx pe then 2 weeks later developed daily cough worse w/in an hour of hs better if keeps a cough drop in place.   Ow/ waking up all night long has to get up due to sense of pnds  Best option is water drinking/ inhalers no better  Doe x 50 -100 ft   No obvious other patterns in day to day or daytime variabilty or assoc excess/ purulent sputum or mucus plugs   or cp or chest tightness, subjective wheeze overt   hb symptoms. No unusual exp hx or h/o childhood pna/ asthma or knowledge of premature birth.  Sleeping ok without nocturnal  or early am exacerbation  of respiratory  c/o's or need for noct saba. Also denies any obvious fluctuation of symptoms with weather or environmental changes or other aggravating or alleviating factors except as outlined above   Current Medications, Allergies, Complete Past Medical History, Past Surgical History, Family History, and Social History were reviewed in [employer] record.             Review of Systems  Constitutional: Negative for fever, chills, activity change, appetite change and unexpected weight change.  HENT: Negative for congestion, dental problem, postnasal drip, rhinorrhea, sneezing, sore throat, trouble swallowing and voice change.   Eyes: Negative for visual disturbance.  Respiratory: Positive for cough  and shortness of breath. Negative for choking.   Cardiovascular: Negative for chest pain and leg swelling.  Gastrointestinal: Negative for nausea, vomiting and abdominal pain.  Genitourinary: Negative for difficulty urinating.  Musculoskeletal: Negative for arthralgias.  Skin: Negative for rash.  Psychiatric/Behavioral: Negative for behavioral problems and confusion.       Objective:   Physical Exam  Elderly bm mod hoarse nad/ very unusual affect      Wt Readings from Last 3 Encounters:  02/18/15 261 lb 6.4 oz (118.57 kg)  01/25/15 262 lb 11.2 oz (119.16 kg)  11/30/14 260 lb (117.935 kg)    Vital signs reviewed  HEENT: nl dentition, turbinates, and oropharynx. Nl external ear canals without cough reflex   NECK :  without JVD/Nodes/TM/ nl carotid upstrokes bilaterally   LUNGS: no acc muscle use,  Nl contour chest which is clear to A and P bilaterally without cough on insp or exp maneuvers   CV:  RRR  no s3 or murmur or increase in P2, no edema   ABD:  soft and nontender with nl inspiratory excursion in the supine position. No bruits or organomegaly, bowel sounds nl  MS:  S/p R BKA  With appropriate  gait/  L ext warm   calf tenderness, cyanosis or clubbing No obvious joint restrictions   SKIN: warm and dry without lesions    NEURO:  alert, approp, nl sensorium with  no motor  deficits     CXR PA and Lateral:   02/18/2015 :   Ordered but did not go to radiology as requested          Assessment & Plan:

## 2015-02-18 NOTE — Assessment & Plan Note (Addendum)
The most common causes of chronic cough in immunocompetent adults include the following: upper airway cough syndrome (UACS), previously referred to as postnasal drip syndrome (PNDS), which is caused by variety of rhinosinus conditions; (2) asthma; (3) GERD; (4) chronic bronchitis from cigarette smoking or other inhaled environmental irritants; (5) nonasthmatic eosinophilic bronchitis; and (6) bronchiectasis.   These conditions, singly or in combination, have accounted for up to 94% of the causes of chronic cough in prospective studies.   Other conditions have constituted no >6% of the causes in prospective studies These have included bronchogenic carcinoma, chronic interstitial pneumonia, sarcoidosis, left ventricular failure, ACEI-induced cough, and aspiration from a condition associated with pharyngeal dysfunction.    Chronic cough is often simultaneously caused by more than one condition. A single cause has been found from 38 to 82% of the time, multiple causes from 18 to 62%. Multiply caused cough has been the result of three diseases up to 42% of the time.       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   These are the same pts (now being labeled as having "irritable larynx syndrome" by some cough centers) who not infrequently have a history of having failed to tolerate ace inhibitors,  dry powder inhalers or biphosphonates or report having atypical reflux symptoms that don't respond to standard doses of PPI , and are easily confused as  having aecopd or asthma flares by even experienced allergists/ pulmonologists.   The first step is to maximize acid suppression and eliminate ACEi  then regroup if the cough persists.  I had an extended discussion with the patient reviewing all relevant studies completed to date and  lasting 35 min  1) Explained: The standardized cough guidelines published in Chest by Lissa Morales in 2006 are still the best available and consist of a multiple step process (up to 12!) , not a single office visit,  and are intended  to address this problem logically,  with an alogrithm dependent on response to empiric treatment at  each progressive step  to determine a specific diagnosis with  minimal addtional testing needed. Therefore if adherence is an issue or can't be accurately verified,  it's very unlikely the standard evaluation and treatment will be successful here.    Furthermore, response to therapy (other than acute cough suppression, which should only be used short term with avoidance of narcotic containing cough syrups if possible), can be a gradual process for which the patient may perceive immediate benefit.  Unlike going to an eye doctor where the best perscription is almost always the first one and is immediately effective, this is almost never the case in the management of chronic cough syndromes. Therefore the patient needs to commit up front to consistently adhere to recommendations  for up to 6 weeks of therapy directed at the likely underlying problem(s) before the response can be reasonably evaluated.     2) Each maintenance medication was reviewed in detail including most importantly the difference between maintenance and prns and under what  circumstances the prns are to be triggered using an action plan format that is not reflected in the computer generated alphabetically organized AVS.    Please see instructions for details which were reviewed in writing and the patient given a copy  highlighting the part that I personally wrote and discussed at today's ov.   See instructions for specific recommendations which were reviewed directly with the patient who was given a copy with highlighter outlining the key components.

## 2015-02-19 DIAGNOSIS — I10 Essential (primary) hypertension: Secondary | ICD-10-CM | POA: Insufficient documentation

## 2015-02-19 NOTE — Assessment & Plan Note (Signed)
Complicated by HBP/ Hyperlipidemia/ DM   Body mass index is 36.47    Lab Results  Component Value Date   TSH 1.316 05/24/2010     Contributing to gerd tendency/ doe/reviewed the need and the process to achieve and maintain neg calorie balance > defer f/u primary care including intermittently monitoring thyroid status

## 2015-02-19 NOTE — Assessment & Plan Note (Signed)
He had yet changed over to ARB as requested by DR C by the time of ov   In the best review of chronic cough to date ( NEJM 2016 375 [id number]) ,  ACEi are now felt to cause cough in up to  20% of pts which is a 4 fold increase from previous reports and does not include the variety of non-specific complaints we see in pulmonary clinic in pts on ACEi but previously attributed to copd/asthma to include PNDS, throat and chest congestion, "bronchitis", unexplained dyspnea and noct "strangling" sensations as well as atypical /refractory GERD symptoms like atypical dysphagia.   Explained to pt in detail :   The only way to prove this is not an "ACEi Case" is a trial off ACEi x a minimum of 6 weeks then regroup. While off acei also needs max rx directed at atypiical/ extra-esophageal gerd/ pnds the latter  two frequently co-existing in acei cases and almost impossible to tease out from acei effects

## 2015-02-20 ENCOUNTER — Ambulatory Visit (INDEPENDENT_AMBULATORY_CARE_PROVIDER_SITE_OTHER)
Admission: RE | Admit: 2015-02-20 | Discharge: 2015-02-20 | Disposition: A | Discharge status: Home / Self Care | Payer: Medicare Other | Source: Ambulatory Visit | Attending: Internal Medicine | Admitting: Internal Medicine

## 2015-02-20 DIAGNOSIS — R05 Cough: Secondary | ICD-10-CM

## 2015-02-20 DIAGNOSIS — R0602 Shortness of breath: Secondary | ICD-10-CM | POA: Diagnosis not present

## 2015-02-20 DIAGNOSIS — R053 Chronic cough: Secondary | ICD-10-CM

## 2015-03-08 ENCOUNTER — Ambulatory Visit (INDEPENDENT_AMBULATORY_CARE_PROVIDER_SITE_OTHER): Payer: Medicare Other | Admitting: Pharmacist Clinician (PhC)/ Clinical Pharmacy Specialist

## 2015-03-08 DIAGNOSIS — Z8672 Personal history of thrombophlebitis: Secondary | ICD-10-CM

## 2015-03-08 DIAGNOSIS — Z7901 Long term (current) use of anticoagulants: Secondary | ICD-10-CM

## 2015-03-08 LAB — POCT INR: INR: 2.8

## 2015-03-12 DIAGNOSIS — E790 Hyperuricemia without signs of inflammatory arthritis and tophaceous disease: Secondary | ICD-10-CM | POA: Diagnosis not present

## 2015-03-12 DIAGNOSIS — E1165 Type 2 diabetes mellitus with hyperglycemia: Secondary | ICD-10-CM | POA: Diagnosis not present

## 2015-03-12 DIAGNOSIS — E782 Mixed hyperlipidemia: Secondary | ICD-10-CM | POA: Diagnosis not present

## 2015-03-14 ENCOUNTER — Other Ambulatory Visit: Payer: Self-pay | Admitting: *Deleted

## 2015-03-14 DIAGNOSIS — Z89511 Acquired absence of right leg below knee: Secondary | ICD-10-CM | POA: Diagnosis not present

## 2015-03-14 DIAGNOSIS — E790 Hyperuricemia without signs of inflammatory arthritis and tophaceous disease: Secondary | ICD-10-CM | POA: Diagnosis not present

## 2015-03-14 DIAGNOSIS — I10 Essential (primary) hypertension: Secondary | ICD-10-CM | POA: Diagnosis not present

## 2015-03-14 DIAGNOSIS — Z9641 Presence of insulin pump (external) (internal): Secondary | ICD-10-CM | POA: Diagnosis not present

## 2015-03-14 DIAGNOSIS — E1165 Type 2 diabetes mellitus with hyperglycemia: Secondary | ICD-10-CM | POA: Diagnosis not present

## 2015-03-14 DIAGNOSIS — E782 Mixed hyperlipidemia: Secondary | ICD-10-CM | POA: Diagnosis not present

## 2015-03-14 DIAGNOSIS — E1142 Type 2 diabetes mellitus with diabetic polyneuropathy: Secondary | ICD-10-CM | POA: Diagnosis not present

## 2015-03-14 DIAGNOSIS — Z794 Long term (current) use of insulin: Secondary | ICD-10-CM | POA: Diagnosis not present

## 2015-03-15 DIAGNOSIS — N183 Chronic kidney disease, stage 3 (moderate): Secondary | ICD-10-CM | POA: Diagnosis not present

## 2015-04-02 DIAGNOSIS — I70293 Other atherosclerosis of native arteries of extremities, bilateral legs: Secondary | ICD-10-CM | POA: Diagnosis not present

## 2015-04-02 DIAGNOSIS — L602 Onychogryphosis: Secondary | ICD-10-CM | POA: Diagnosis not present

## 2015-04-02 DIAGNOSIS — E1351 Other specified diabetes mellitus with diabetic peripheral angiopathy without gangrene: Secondary | ICD-10-CM | POA: Diagnosis not present

## 2015-04-19 ENCOUNTER — Encounter: Payer: Medicare Other | Admitting: Pharmacist Clinician (PhC)/ Clinical Pharmacy Specialist

## 2015-04-26 ENCOUNTER — Ambulatory Visit (INDEPENDENT_AMBULATORY_CARE_PROVIDER_SITE_OTHER): Payer: Medicare Other | Admitting: Pharmacist Clinician (PhC)/ Clinical Pharmacy Specialist

## 2015-04-26 DIAGNOSIS — Z8672 Personal history of thrombophlebitis: Secondary | ICD-10-CM

## 2015-04-26 DIAGNOSIS — Z7901 Long term (current) use of anticoagulants: Secondary | ICD-10-CM

## 2015-04-26 LAB — POCT INR: INR: 2.8

## 2015-06-04 DIAGNOSIS — I70293 Other atherosclerosis of native arteries of extremities, bilateral legs: Secondary | ICD-10-CM | POA: Diagnosis not present

## 2015-06-04 DIAGNOSIS — E1351 Other specified diabetes mellitus with diabetic peripheral angiopathy without gangrene: Secondary | ICD-10-CM | POA: Diagnosis not present

## 2015-06-04 DIAGNOSIS — L602 Onychogryphosis: Secondary | ICD-10-CM | POA: Diagnosis not present

## 2015-06-07 ENCOUNTER — Encounter: Payer: Medicare Other | Admitting: Pharmacist Clinician (PhC)/ Clinical Pharmacy Specialist

## 2015-06-13 ENCOUNTER — Ambulatory Visit (INDEPENDENT_AMBULATORY_CARE_PROVIDER_SITE_OTHER): Payer: Medicare Other | Admitting: Pharmacist Clinician (PhC)/ Clinical Pharmacy Specialist

## 2015-06-13 DIAGNOSIS — Z7901 Long term (current) use of anticoagulants: Secondary | ICD-10-CM | POA: Diagnosis not present

## 2015-06-13 DIAGNOSIS — Z8672 Personal history of thrombophlebitis: Secondary | ICD-10-CM | POA: Diagnosis not present

## 2015-06-13 LAB — POCT INR: INR: 2.6

## 2015-06-17 ENCOUNTER — Telehealth: Payer: Self-pay

## 2015-06-17 DIAGNOSIS — M79605 Pain in left leg: Secondary | ICD-10-CM

## 2015-06-17 DIAGNOSIS — M7989 Other specified soft tissue disorders: Secondary | ICD-10-CM

## 2015-06-17 NOTE — Telephone Encounter (Signed)
Discussed with Nurse Practitioner.  Advised to schedule for left LE venous duplex and office appt. Tomorrow, 4/11.

## 2015-06-17 NOTE — Telephone Encounter (Signed)
sched appt 4/11, lab 11:30, NP 12:15. Spoke to pt to inform them of appt,

## 2015-06-17 NOTE — Telephone Encounter (Signed)
rec'd phone call from pt.  Reported has swelling left leg over past week.  Reported the swelling in from left foot up to left thigh.  Denied any localized redness, tenderness, or warmth.  Stated it is hard to stand and walk on the left leg, due to knee discomfort, but that this has been ongoing, since he had gout in left knee, about one year ago.  Reported hx of blood blot in lung.   Also, c/o increased SOB with activity; reports that he "tires out more easily and has more difficulty with walking."  Stated "my weight isn't the best, either."  Denied missing any doses of Coumadin.  Advised will discuss with nurse practitioner, and return call to pt.

## 2015-06-18 ENCOUNTER — Ambulatory Visit (HOSPITAL_COMMUNITY)
Admission: RE | Admit: 2015-06-18 | Discharge: 2015-06-18 | Disposition: A | Discharge status: Home / Self Care | Payer: Medicare Other | Source: Ambulatory Visit | Attending: Vascular Surgery | Admitting: Vascular Surgery

## 2015-06-18 ENCOUNTER — Ambulatory Visit (INDEPENDENT_AMBULATORY_CARE_PROVIDER_SITE_OTHER): Payer: Medicare Other | Admitting: Family

## 2015-06-18 ENCOUNTER — Encounter: Payer: Self-pay | Admitting: Family

## 2015-06-18 VITALS — BP 135/74 | HR 70 | Temp 96.7°F | Resp 20 | Ht 71.0 in | Wt 265.0 lb

## 2015-06-18 DIAGNOSIS — M79605 Pain in left leg: Secondary | ICD-10-CM | POA: Diagnosis not present

## 2015-06-18 DIAGNOSIS — E1122 Type 2 diabetes mellitus with diabetic chronic kidney disease: Secondary | ICD-10-CM | POA: Insufficient documentation

## 2015-06-18 DIAGNOSIS — M7989 Other specified soft tissue disorders: Secondary | ICD-10-CM

## 2015-06-18 DIAGNOSIS — I129 Hypertensive chronic kidney disease with stage 1 through stage 4 chronic kidney disease, or unspecified chronic kidney disease: Secondary | ICD-10-CM | POA: Diagnosis not present

## 2015-06-18 DIAGNOSIS — N189 Chronic kidney disease, unspecified: Secondary | ICD-10-CM | POA: Diagnosis not present

## 2015-06-18 DIAGNOSIS — E1151 Type 2 diabetes mellitus with diabetic peripheral angiopathy without gangrene: Secondary | ICD-10-CM

## 2015-06-18 DIAGNOSIS — E785 Hyperlipidemia, unspecified: Secondary | ICD-10-CM | POA: Diagnosis not present

## 2015-06-18 DIAGNOSIS — I739 Peripheral vascular disease, unspecified: Secondary | ICD-10-CM

## 2015-06-18 DIAGNOSIS — Z89511 Acquired absence of right leg below knee: Secondary | ICD-10-CM

## 2015-06-18 DIAGNOSIS — Z87891 Personal history of nicotine dependence: Secondary | ICD-10-CM

## 2015-06-18 NOTE — Progress Notes (Signed)
[facility] [place] HISTORY AND PHYSICAL -PAD  History of Present Illness Patrick Shelton is a 75 y.o. male patient that Dr. 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  He also admits to not elevating his left leg as much when he sleeps and has developed swelling. His sx include B hand and left foot paraesthesia and anesthesia. The patient's treatment regimen currently included: maximal medical management.  Pt denies claudication symptoms in LLE, he had difficulty feeling his left foot brake pedal while driving, his Lucianne Lei is fitted for hand control of brake and accelerator.  Pt denies non-healing wounds.  Pt denies history of stroke or TIA.  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   Pt reports seeing his PCP since he was here last, has gout in both hands. 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  He has a treadmill at home, is using it, states he now has a better fitting prosthesis.  Pt admits to lacking enough motivation to better control his DM, is tired, no energy. He states that he tries not to sleep due to bad dreams, states he has PTSD, and has thought about getting help from the [facility]; I encouraged him to follow up on this.  He has imporoved his diet.  He is not walking much due to left LE gout and arthritis pain.   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  Pt Diabetic: Yes, states last A1C was 7.?, he uses an insulin pump.  Pt smoker: former smoker, quit in 1979   Pt meds include:  Statin :Yes  ASA: No  Other anticoagulants/antiplatelets:  coumadin      Past Medical History  Diagnosis Date  . Hypertension   . Diabetes mellitus   . PVD (peripheral vascular disease) ([place])   . S/P BKA (below knee amputation) ([place])   . Hyperlipidemia   . PVT (paroxysmal ventricular tachycardia) ([place])   . ED (erectile dysfunction)   . Chronic kidney disease   . Cancer (HCC)     8 wks Radiation  . Hx of agent Orange exposure     while [place] in [place] during that conflict  . 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 Arthritis Sept. 2015    Gout-Right Hand  Left knee    Social History Social History  Substance Use Topics  . Smoking status: Former Smoker -- 0.25 packs/day for 10 years    Types: Cigarettes    Quit date: 03/09/1977  . Smokeless tobacco: Never Used  . Alcohol Use: No    Family History Family History  Problem Relation Age of Onset  . Dementia Mother   . Diabetes Father     Amputation- Bilateral leg  . Peripheral vascular disease Father     Past Surgical History  Procedure Laterality Date  . Amputation Right 2009    BKA  . Prostate surgery      8 wks radiation    Allergies  Allergen Reactions  . Prednisone Anaphylaxis and Shortness Of  Breath    Current Outpatient Prescriptions  Medication Sig Dispense Refill  . amLODipine (NORVASC) 10 MG tablet Take 10 mg by mouth daily.      . Cholecalciferol (VITAMIN D-3) 5000 UNITS TABS Take 5,000 Units by mouth daily.     . colchicine 0.6 MG tablet Take 1 tablet (0.6 mg total) by mouth daily. (Patient taking differently: Take 0.6 mg by mouth as needed. ) 12 tablet 0  . ezetimibe (ZETIA) 10 MG tablet Take 5 mg by mouth daily.     . famotidine (PEPCID) 20 MG tablet One at bedtime 30 tablet 2  . hydrochlorothiazide 25 MG tablet Take 25 mg by mouth daily.      Marland Kitchen HYDROcodone-acetaminophen (NORCO/VICODIN) 5-325 MG per tablet Take 1-2 tablets by mouth every 4 (four) hours as needed. 20 tablet 0  . Insulin Human (INSULIN PUMP) 100 unit/ml SOLN Inject into  the skin continuous. Max 155 U/day    . pantoprazole (PROTONIX) 40 MG tablet Take 1 tablet (40 mg total) by mouth daily. Take 30-60 min before first meal of the day 30 tablet 2  . pravastatin (PRAVACHOL) 40 MG tablet Take 40 mg by mouth daily.      . valsartan (DIOVAN) 320 MG tablet Take 1 tablet (320 mg total) by mouth daily. 90 tablet 3  . warfarin (COUMADIN) 2.5 MG tablet TAKE 1 TABLET DAILY OR AS DIRECTED BY COUMADIN CLINIC 90 tablet 1   No current facility-administered medications for this visit.    ROS: See HPI for pertinent positives and negatives.   Physical Examination  Filed Vitals:   06/18/15 1232  BP: 135/74  Pulse: 70  Temp: 96.7 F (35.9 C)  TempSrc: Oral  Resp: 20  Height: 5\' 11"  (1.803 m)  Weight: 265 lb (120.203 kg)   Body mass index is 36.98 kg/(m^2).  General: A&O x 3, WDWN, obese male.  Gait: in w/c Eyes: Pupils equal  Pulmonary: CTAB, without wheezes , rales or rhonchi  Cardiac: regular Rythm , without murmur   Carotid Bruits  Left  Right    Negative  Negative   Aorta is not palpable Radial pulses: 1+ palpable bilaterally   VASCULAR EXAM:  Extremities without ischemic changes  without Gangrene; without open wounds. Wearing prosthesis on right BKA.  Left lower leg with 2+ non pitting edema, no erythema, no signs of cellulitis.  LE Pulses  LEFT  RIGHT   FEMORAL palpable palpable  POPLITEAL  not palpable  not palpable   POSTERIOR TIBIAL  not palpable, strongly audible Doppler signal  BKA   DORSALIS PEDIS  ANTERIOR TIBIAL  not palpable, strongly audible Doppler signal BKA    Abdomen: soft, NT, no masses palpated.  Skin: no rashes, no ulcers.  Musculoskeletal: no muscle wasting or atrophy, wearing right BKA prosthesis.  Neurologic: A&O X 3; Appropriate Affect ; SENSATION: normal; MOTOR FUNCTION: moving all extremities equally, motor strength 5/5 in UE's, 4/5 in LE's. Speech is  fluent/normal. CN 2-12 grossly intact.                 Non-Invasive Vascular Imaging: DATE: 06/18/2015 LOWER EXTREMITY VENOUS DUPLEX EVALUATION    INDICATION: Left lower extremity edema    PREVIOUS INTERVENTION(S): Right below knee amputation; History of PE    DUPLEX EXAM:      Common Femoral Vein  Femoral Vein Popliteal Vein Posterior Tibial Vein  Peroneal Vein Great Saphenous Vein   Right Left Right Left Right Left Right Left Right Left Right Left  Spontaneous  +  +  +  +  +  +  Phasic  +  +  +  +  +  +  Compressible  +  +  +  +  +  +  Competent  +  -  +  +  +  -     Legend:  + = Yes, -  = No; P = Partial, D = Decreased, NV = Not Visualized, NA = Not Examined    Thrombosis  -  -  -  -  -  -                                                        Legend:  A = Acute, C = Chronic, O = Obstructive, P = Partially Obstructive     ADDITIONAL FINDINGS:     IMPRESSION: 1. No evidence of acute left lower extremity deep vein thrombosis, calf veins not well visualized due to depth of vein and due to acoustic shadowing of artery, however no obvious thrombus visualized    ASSESSMENT: Patrick Shelton is a 75 y.o. 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  He is ambulating with his right BKA prosthesis. He has no ulcers on his right BKA stump, no ischemic changes in his left LE. He has no claudication in his left LE. Left LE arteries are non-compressible due to calcification secondary to chronic hyperglycemia.  His A1C is improving. He has not smoked since 1979. 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   No DVT on venous duplex today.  Left lower leg with 2+ non pitting edema, no erythema, no signs of cellulitis. No dyspnea. He admits to not elevating his left leg as much in the last 1-2 weeks. Left DP and PT pulse with strong Doppler signals. No signs of ischemia in left foot/leg Pt instructed  in proper leg elevation when he is not walking.  PLAN:  Based on the patient's vascular studies and examination, pt will return to clinic as already scheduled in September 2017 for left ABI/TBI.   I discussed in depth with the patient the nature of atherosclerosis, and emphasized the importance of maximal medical management including strict control of blood pressure, blood glucose, and lipid levels, obtaining regular exercise, and continued cessation of smoking.  The patient is aware that without maximal medical management the underlying atherosclerotic disease process will progress, limiting the benefit of any interventions.  The patient was given information about PAD including signs, symptoms, treatment, what symptoms should prompt the patient to seek immediate medical care, and risk reduction measures to take.  Clemon Chambers, RN, MSN, FNP-C [facility] Phone: [phone]  Clinic MD: Scot Dock on call  06/18/2015 12:56 PM

## 2015-07-11 DIAGNOSIS — Z961 Presence of intraocular lens: Secondary | ICD-10-CM | POA: Diagnosis not present

## 2015-07-11 DIAGNOSIS — H26493 Other secondary cataract, bilateral: Secondary | ICD-10-CM | POA: Diagnosis not present

## 2015-07-11 DIAGNOSIS — E113593 Type 2 diabetes mellitus with proliferative diabetic retinopathy without macular edema, bilateral: Secondary | ICD-10-CM | POA: Diagnosis not present

## 2015-07-23 ENCOUNTER — Ambulatory Visit (INDEPENDENT_AMBULATORY_CARE_PROVIDER_SITE_OTHER): Payer: Medicare Other | Admitting: Pharmacist Clinician (PhC)/ Clinical Pharmacy Specialist

## 2015-07-23 DIAGNOSIS — Z7901 Long term (current) use of anticoagulants: Secondary | ICD-10-CM | POA: Diagnosis not present

## 2015-07-23 DIAGNOSIS — Z8672 Personal history of thrombophlebitis: Secondary | ICD-10-CM

## 2015-07-23 LAB — POCT INR: INR: 1.9

## 2015-07-25 DIAGNOSIS — N139 Obstructive and reflux uropathy, unspecified: Secondary | ICD-10-CM | POA: Diagnosis not present

## 2015-07-25 DIAGNOSIS — Z0000 Encounter for general adult medical examination without abnormal findings: Secondary | ICD-10-CM | POA: Diagnosis not present

## 2015-07-25 DIAGNOSIS — C61 Malignant neoplasm of prostate: Secondary | ICD-10-CM | POA: Diagnosis not present

## 2015-08-03 ENCOUNTER — Other Ambulatory Visit: Payer: Self-pay | Admitting: Cardiovascular Disease

## 2015-08-13 DIAGNOSIS — M1712 Unilateral primary osteoarthritis, left knee: Secondary | ICD-10-CM | POA: Diagnosis not present

## 2015-08-13 DIAGNOSIS — M25562 Pain in left knee: Secondary | ICD-10-CM | POA: Diagnosis not present

## 2015-08-13 DIAGNOSIS — G8929 Other chronic pain: Secondary | ICD-10-CM | POA: Diagnosis not present

## 2015-08-23 ENCOUNTER — Ambulatory Visit (INDEPENDENT_AMBULATORY_CARE_PROVIDER_SITE_OTHER): Payer: Medicare Other | Admitting: Pharmacist

## 2015-08-23 DIAGNOSIS — Z7901 Long term (current) use of anticoagulants: Secondary | ICD-10-CM | POA: Diagnosis not present

## 2015-08-23 DIAGNOSIS — Z8672 Personal history of thrombophlebitis: Secondary | ICD-10-CM | POA: Diagnosis not present

## 2015-08-23 LAB — POCT INR: INR: 2.2

## 2015-09-03 DIAGNOSIS — L602 Onychogryphosis: Secondary | ICD-10-CM | POA: Diagnosis not present

## 2015-09-03 DIAGNOSIS — E1351 Other specified diabetes mellitus with diabetic peripheral angiopathy without gangrene: Secondary | ICD-10-CM | POA: Diagnosis not present

## 2015-09-03 DIAGNOSIS — I70293 Other atherosclerosis of native arteries of extremities, bilateral legs: Secondary | ICD-10-CM | POA: Diagnosis not present

## 2015-09-09 ENCOUNTER — Encounter: Payer: Self-pay | Admitting: Cardiology

## 2015-09-09 ENCOUNTER — Ambulatory Visit (INDEPENDENT_AMBULATORY_CARE_PROVIDER_SITE_OTHER): Payer: Medicare Other | Admitting: Cardiology

## 2015-09-09 VITALS — BP 141/71 | HR 70 | Ht 71.0 in | Wt 270.0 lb

## 2015-09-09 DIAGNOSIS — Z9189 Other specified personal risk factors, not elsewhere classified: Secondary | ICD-10-CM | POA: Diagnosis not present

## 2015-09-09 DIAGNOSIS — N184 Chronic kidney disease, stage 4 (severe): Secondary | ICD-10-CM

## 2015-09-09 DIAGNOSIS — R600 Localized edema: Secondary | ICD-10-CM

## 2015-09-09 DIAGNOSIS — E1122 Type 2 diabetes mellitus with diabetic chronic kidney disease: Secondary | ICD-10-CM | POA: Diagnosis not present

## 2015-09-09 DIAGNOSIS — N189 Chronic kidney disease, unspecified: Secondary | ICD-10-CM

## 2015-09-09 DIAGNOSIS — Z89511 Acquired absence of right leg below knee: Secondary | ICD-10-CM | POA: Insufficient documentation

## 2015-09-09 DIAGNOSIS — Z86711 Personal history of pulmonary embolism: Secondary | ICD-10-CM | POA: Diagnosis not present

## 2015-09-09 DIAGNOSIS — IMO0002 Reserved for concepts with insufficient information to code with codable children: Secondary | ICD-10-CM | POA: Insufficient documentation

## 2015-09-09 DIAGNOSIS — Z77098 Contact with and (suspected) exposure to other hazardous, chiefly nonmedicinal, chemicals: Secondary | ICD-10-CM | POA: Insufficient documentation

## 2015-09-09 NOTE — Patient Instructions (Signed)
Medication Instructions:  Continue current medications  Labwork: NONE  Testing/Procedures: NONE  Follow-Up: Your physician wants you to follow-up in: 1 Year. You will receive a reminder letter in the mail two months in advance. If you don't receive a letter, please call our office to schedule the follow-up appointment.   Any Other Special Instructions Will Be Listed Below (If Applicable).   If you need a refill on your cardiac medications before your next appointment, please call your pharmacy.   

## 2015-09-09 NOTE — Assessment & Plan Note (Signed)
Last GFR 29

## 2015-09-09 NOTE — Assessment & Plan Note (Signed)
2010- on life long Couamdin

## 2015-09-09 NOTE — Assessment & Plan Note (Signed)
Trace edema LLE

## 2015-09-09 NOTE — Assessment & Plan Note (Signed)
[facility] 

## 2015-09-09 NOTE — Progress Notes (Signed)
Cardiology Office Note    Date:  09/09/2015   ID:  Patrick Shelton, DOB 31-Jan-1941, MRN [id number]  PCP:  Limmie Patricia, MD [facility] Cardiologist:  Dr Sallyanne Kuster  Chief Complaint  Patient presents with  . Follow-up    History of Present Illness:  Patrick Shelton is a 75 y.o. male, [place] with a history of agent orange exposure, IDDM, PVD, stage 4 CRI, HTN with HCVD, prostate cancer, and prior pulmonary embolism on chronic Coumadin. He has had coronary Ca++ on CT, Myoview was low risk 2011, echo in Dec 2016 showed kild LVH, normal LVF, grade 1 DD. He is in the office today to discuss some edema in his Lt leg. He also has had DOE. He tells me the [place] told him he had "low testosterone" but he can't take supplement secondary to his history of prostate cancer. He denies chest pain or orthopnea.    Past Medical History  Diagnosis Date  . Hypertension   . Diabetes mellitus   . PVD (peripheral vascular disease) ([place])   . S/P BKA (below knee amputation) ([place])   . Hyperlipidemia   . PVT (paroxysmal ventricular tachycardia) ([place])   . ED (erectile dysfunction)   . Chronic kidney disease   . Cancer (HCC)     8 wks Radiation  . Hx of agent Orange exposure     while [place] in [place] during that conflict  . 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 Arthritis Sept. 2015    Gout-Right Hand  Left knee    Past Surgical History  Procedure Laterality Date  . Amputation Right 2009    BKA  . Prostate surgery      8 wks radiation    Current Medications: Outpatient Prescriptions Prior to Visit  Medication Sig Dispense Refill  . amLODipine (NORVASC) 10 MG tablet Take 10 mg by mouth daily.      . Cholecalciferol (VITAMIN D-3) 5000 UNITS TABS Take 5,000 Units by mouth daily.     Marland Kitchen ezetimibe (ZETIA) 10 MG tablet Take 5 mg by mouth daily.     . hydrochlorothiazide 25 MG tablet Take 25 mg by mouth daily.      . pravastatin (PRAVACHOL) 40 MG tablet Take 40 mg by mouth daily.        Marland Kitchen warfarin (COUMADIN) 2.5 MG tablet TAKE 1 TABLET DAILY OR AS DIRECTED BY COUMADIN CLINIC 90 tablet 0  . colchicine 0.6 MG tablet Take 1 tablet (0.6 mg total) by mouth daily. (Patient not taking: Reported on 09/09/2015) 12 tablet 0  . famotidine (PEPCID) 20 MG tablet One at bedtime (Patient not taking: Reported on 09/09/2015) 30 tablet 2  . HYDROcodone-acetaminophen (NORCO/VICODIN) 5-325 MG per tablet Take 1-2 tablets by mouth every 4 (four) hours as needed. (Patient not taking: Reported on 09/09/2015) 20 tablet 0  . Insulin Human (INSULIN PUMP) 100 unit/ml SOLN Inject into the skin continuous. Reported on 09/09/2015    . pantoprazole (PROTONIX) 40 MG tablet Take 1 tablet (40 mg total) by mouth daily. Take 30-60 min before first meal of the day (Patient not taking: Reported on 09/09/2015) 30 tablet 2  . valsartan (DIOVAN) 320 MG tablet Take 1 tablet (320 mg total) by mouth daily. (Patient not taking: Reported on 09/09/2015) 90 tablet 3   No facility-administered medications prior to visit.     Allergies:   Prednisone   Social History   Social History  . Marital Status: Married  Spouse Name: N/A  . Number of Children: N/A  . Years of Education: N/A   Social History Main Topics  . Smoking status: Former Smoker -- 0.25 packs/day for 10 years    Types: Cigarettes    Quit date: 03/09/1977  . Smokeless tobacco: Never Used  . Alcohol Use: No  . Drug Use: No  . Sexual Activity: Not Asked   Other Topics Concern  . None   Social History Narrative     Family History:  The patient's family history includes Dementia in his mother; Diabetes in his father; Peripheral vascular disease in his father.   ROS:   Please see the history of present illness.    ROS All other systems reviewed and are negative.   PHYSICAL EXAM:   VS:  BP 141/71 mmHg  Pulse 70  Ht 5\' 11"  (1.803 m)  Wt 270 lb (122.471 kg)  BMI 37.67 kg/m2   GEN: Obese AA male in wheel chair, well developed, in no acute distress HEENT:  normal Neck: no JVD, carotid bruits, or masses Cardiac: RRR; no murmurs, rubs, or gallops,no edema  Respiratory:  clear to auscultation bilaterally, normal work of breathing GI: soft, nontender, nondistended, + BS MS: no deformity or atrophy Skin: warm and dry, no rash Neuro:  Alert and Oriented x 3, Strength and sensation are intact Extrem: Rt BKA, trace edema LLE with chronic skin changes Psych: euthymic mood, full affect  Wt Readings from Last 3 Encounters:  09/09/15 270 lb (122.471 kg)  06/18/15 265 lb (120.203 kg)  02/18/15 261 lb 6.4 oz (118.57 kg)      Studies/Labs Reviewed:    Recent Labs: No results found for requested labs within last 365 days.   Lipid Panel    Component Value Date/Time   CHOL 156 05/24/2010 0134   TRIG 250* 05/24/2010 0134   HDL 28* 05/24/2010 0134   CHOLHDL 5.6 Ratio 05/24/2010 0134   VLDL 50* 05/24/2010 0134   LDLCALC 78 05/24/2010 0134       ASSESSMENT:    1. History of pulmonary embolus (PE)   2. Diabetes mellitus with stage 4 chronic kidney disease ([place])   3. Status post below knee amputation of right lower extremity ([place])   4. History of agent Orange exposure   5. Edema of left lower extremity   6. Chronic renal insufficiency, stage IV (severe)      PLAN:  In order of problems listed above:  1. Chronic Couamdin 2. Followed by Dr Altheimer 3. Followed by vascular surgery 4. Followed at the [facility] 5.    I don't think his edema is secondary to diastpolic CHF. It's possibly secondary to Amlodipine. On further reflection the pt admits his provider at the [place] wanted to take him off Amlodipine and start another agent.   Plan:   I suggested Mr Sallade follow up with the [place]. His care is fractured by having providers in two systems. I think it may be time he sees a nephrologist and I asked to talk to Dr Altheimer and the [facility] about this. I encouraged him to go ahead with changed recommended by his PCP as I think the Amlodipine may be causing  his edema.    Medication Adjustments/Labs and Tests Ordered: Current medicines are reviewed at length with the patient today.  Concerns regarding medicines are outlined above.  Medication changes, Labs and Tests ordered today are listed in the Patient Instructions below. Patient Instructions  Medication Instructions:  Continue current medications  Labwork: NONE  Testing/Procedures: NONE  Follow-Up: Your physician wants you to follow-up in: 1 Year. You will receive a reminder letter in the mail two months in advance. If you don't receive a letter, please call our office to schedule the follow-up appointment.   Any Other Special Instructions Will Be Listed Below (If Applicable).   If you need a refill on your cardiac medications before your next appointment, please call your pharmacy.       Signed, Kerin Ransom, PA-C  09/09/2015 2:26 PM    [facility] [address], [place], [place]  [address] Phone: [phone]; Fax: [phone]

## 2015-09-09 NOTE — Assessment & Plan Note (Signed)
Rt BKA 2010

## 2015-09-16 DIAGNOSIS — M109 Gout, unspecified: Secondary | ICD-10-CM | POA: Diagnosis not present

## 2015-09-20 DIAGNOSIS — E782 Mixed hyperlipidemia: Secondary | ICD-10-CM | POA: Diagnosis not present

## 2015-09-20 DIAGNOSIS — E1165 Type 2 diabetes mellitus with hyperglycemia: Secondary | ICD-10-CM | POA: Diagnosis not present

## 2015-09-20 DIAGNOSIS — E790 Hyperuricemia without signs of inflammatory arthritis and tophaceous disease: Secondary | ICD-10-CM | POA: Diagnosis not present

## 2015-09-24 DIAGNOSIS — I10 Essential (primary) hypertension: Secondary | ICD-10-CM | POA: Diagnosis not present

## 2015-09-24 DIAGNOSIS — Z89511 Acquired absence of right leg below knee: Secondary | ICD-10-CM | POA: Diagnosis not present

## 2015-09-24 DIAGNOSIS — E1165 Type 2 diabetes mellitus with hyperglycemia: Secondary | ICD-10-CM | POA: Diagnosis not present

## 2015-09-24 DIAGNOSIS — E782 Mixed hyperlipidemia: Secondary | ICD-10-CM | POA: Diagnosis not present

## 2015-09-24 DIAGNOSIS — E790 Hyperuricemia without signs of inflammatory arthritis and tophaceous disease: Secondary | ICD-10-CM | POA: Diagnosis not present

## 2015-09-24 DIAGNOSIS — E1142 Type 2 diabetes mellitus with diabetic polyneuropathy: Secondary | ICD-10-CM | POA: Diagnosis not present

## 2015-09-24 DIAGNOSIS — Z9641 Presence of insulin pump (external) (internal): Secondary | ICD-10-CM | POA: Diagnosis not present

## 2015-09-24 DIAGNOSIS — Z794 Long term (current) use of insulin: Secondary | ICD-10-CM | POA: Diagnosis not present

## 2015-10-04 ENCOUNTER — Ambulatory Visit (INDEPENDENT_AMBULATORY_CARE_PROVIDER_SITE_OTHER): Payer: Medicare Other | Admitting: Pharmacist

## 2015-10-04 DIAGNOSIS — Z8672 Personal history of thrombophlebitis: Secondary | ICD-10-CM | POA: Diagnosis not present

## 2015-10-04 DIAGNOSIS — Z7901 Long term (current) use of anticoagulants: Secondary | ICD-10-CM | POA: Diagnosis not present

## 2015-10-04 LAB — POCT INR: INR: 2.1

## 2015-10-07 DIAGNOSIS — M25511 Pain in right shoulder: Secondary | ICD-10-CM | POA: Diagnosis not present

## 2015-10-07 DIAGNOSIS — M25512 Pain in left shoulder: Secondary | ICD-10-CM | POA: Diagnosis not present

## 2015-10-07 DIAGNOSIS — M25442 Effusion, left hand: Secondary | ICD-10-CM | POA: Diagnosis not present

## 2015-10-07 DIAGNOSIS — M109 Gout, unspecified: Secondary | ICD-10-CM | POA: Diagnosis not present

## 2015-10-07 DIAGNOSIS — N183 Chronic kidney disease, stage 3 (moderate): Secondary | ICD-10-CM | POA: Diagnosis not present

## 2015-10-07 DIAGNOSIS — M25441 Effusion, right hand: Secondary | ICD-10-CM | POA: Diagnosis not present

## 2015-10-07 DIAGNOSIS — E790 Hyperuricemia without signs of inflammatory arthritis and tophaceous disease: Secondary | ICD-10-CM | POA: Diagnosis not present

## 2015-10-07 DIAGNOSIS — E108 Type 1 diabetes mellitus with unspecified complications: Secondary | ICD-10-CM | POA: Diagnosis not present

## 2015-11-04 ENCOUNTER — Other Ambulatory Visit: Payer: Self-pay | Admitting: Cardiovascular Disease

## 2015-11-05 ENCOUNTER — Encounter: Payer: Self-pay | Admitting: Family

## 2015-11-15 ENCOUNTER — Ambulatory Visit (INDEPENDENT_AMBULATORY_CARE_PROVIDER_SITE_OTHER): Payer: Medicare Other | Admitting: Pharmacist Clinician (PhC)/ Clinical Pharmacy Specialist

## 2015-11-15 DIAGNOSIS — Z8672 Personal history of thrombophlebitis: Secondary | ICD-10-CM | POA: Diagnosis not present

## 2015-11-15 DIAGNOSIS — Z7901 Long term (current) use of anticoagulants: Secondary | ICD-10-CM

## 2015-11-15 LAB — POCT INR: INR: 2.3

## 2015-12-05 DIAGNOSIS — L602 Onychogryphosis: Secondary | ICD-10-CM | POA: Diagnosis not present

## 2015-12-05 DIAGNOSIS — E1351 Other specified diabetes mellitus with diabetic peripheral angiopathy without gangrene: Secondary | ICD-10-CM | POA: Diagnosis not present

## 2015-12-05 DIAGNOSIS — I70293 Other atherosclerosis of native arteries of extremities, bilateral legs: Secondary | ICD-10-CM | POA: Diagnosis not present

## 2015-12-06 ENCOUNTER — Ambulatory Visit: Payer: Medicare Other | Admitting: Family

## 2015-12-06 ENCOUNTER — Ambulatory Visit (HOSPITAL_COMMUNITY): Payer: Medicare Other

## 2015-12-23 DIAGNOSIS — E790 Hyperuricemia without signs of inflammatory arthritis and tophaceous disease: Secondary | ICD-10-CM | POA: Diagnosis not present

## 2015-12-23 DIAGNOSIS — E1165 Type 2 diabetes mellitus with hyperglycemia: Secondary | ICD-10-CM | POA: Diagnosis not present

## 2015-12-23 DIAGNOSIS — E782 Mixed hyperlipidemia: Secondary | ICD-10-CM | POA: Diagnosis not present

## 2015-12-23 DIAGNOSIS — E559 Vitamin D deficiency, unspecified: Secondary | ICD-10-CM | POA: Diagnosis not present

## 2015-12-30 DIAGNOSIS — E1142 Type 2 diabetes mellitus with diabetic polyneuropathy: Secondary | ICD-10-CM | POA: Diagnosis not present

## 2015-12-30 DIAGNOSIS — Z794 Long term (current) use of insulin: Secondary | ICD-10-CM | POA: Diagnosis not present

## 2015-12-30 DIAGNOSIS — Z9641 Presence of insulin pump (external) (internal): Secondary | ICD-10-CM | POA: Diagnosis not present

## 2015-12-30 DIAGNOSIS — I10 Essential (primary) hypertension: Secondary | ICD-10-CM | POA: Diagnosis not present

## 2015-12-30 DIAGNOSIS — E1165 Type 2 diabetes mellitus with hyperglycemia: Secondary | ICD-10-CM | POA: Diagnosis not present

## 2015-12-30 DIAGNOSIS — E782 Mixed hyperlipidemia: Secondary | ICD-10-CM | POA: Diagnosis not present

## 2015-12-30 DIAGNOSIS — Z89511 Acquired absence of right leg below knee: Secondary | ICD-10-CM | POA: Diagnosis not present

## 2015-12-30 DIAGNOSIS — E790 Hyperuricemia without signs of inflammatory arthritis and tophaceous disease: Secondary | ICD-10-CM | POA: Diagnosis not present

## 2015-12-31 ENCOUNTER — Encounter: Payer: Self-pay | Admitting: Family

## 2016-01-02 ENCOUNTER — Other Ambulatory Visit: Payer: Self-pay | Admitting: Cardiovascular Disease

## 2016-01-02 ENCOUNTER — Telehealth: Payer: Self-pay | Admitting: *Deleted

## 2016-01-02 ENCOUNTER — Other Ambulatory Visit: Payer: Self-pay | Admitting: *Deleted

## 2016-01-02 DIAGNOSIS — Z48812 Encounter for surgical aftercare following surgery on the circulatory system: Secondary | ICD-10-CM

## 2016-01-02 DIAGNOSIS — I739 Peripheral vascular disease, unspecified: Secondary | ICD-10-CM

## 2016-01-02 NOTE — Telephone Encounter (Signed)
Not sure if patient is or isn't taking valsartan. Please advise

## 2016-01-03 ENCOUNTER — Ambulatory Visit (HOSPITAL_COMMUNITY)
Admission: RE | Admit: 2016-01-03 | Discharge: 2016-01-03 | Disposition: A | Discharge status: Home / Self Care | Payer: Medicare Other | Source: Ambulatory Visit | Attending: Vascular Surgery | Admitting: Vascular Surgery

## 2016-01-03 ENCOUNTER — Encounter: Payer: Medicare Other | Admitting: Family

## 2016-01-03 ENCOUNTER — Encounter: Payer: Self-pay | Admitting: Family

## 2016-01-03 DIAGNOSIS — E785 Hyperlipidemia, unspecified: Secondary | ICD-10-CM | POA: Insufficient documentation

## 2016-01-03 DIAGNOSIS — Z48812 Encounter for surgical aftercare following surgery on the circulatory system: Secondary | ICD-10-CM | POA: Insufficient documentation

## 2016-01-03 DIAGNOSIS — I739 Peripheral vascular disease, unspecified: Secondary | ICD-10-CM

## 2016-01-03 DIAGNOSIS — I10 Essential (primary) hypertension: Secondary | ICD-10-CM | POA: Insufficient documentation

## 2016-01-03 DIAGNOSIS — R938 Abnormal findings on diagnostic imaging of other specified body structures: Secondary | ICD-10-CM | POA: Diagnosis not present

## 2016-01-03 DIAGNOSIS — E1151 Type 2 diabetes mellitus with diabetic peripheral angiopathy without gangrene: Secondary | ICD-10-CM | POA: Diagnosis not present

## 2016-01-03 DIAGNOSIS — R0989 Other specified symptoms and signs involving the circulatory and respiratory systems: Secondary | ICD-10-CM | POA: Diagnosis present

## 2016-01-06 NOTE — Progress Notes (Signed)
I did not see this patient.

## 2016-01-08 ENCOUNTER — Encounter: Payer: Self-pay | Admitting: Family

## 2016-01-10 ENCOUNTER — Encounter: Payer: Self-pay | Admitting: Family

## 2016-01-10 ENCOUNTER — Ambulatory Visit (INDEPENDENT_AMBULATORY_CARE_PROVIDER_SITE_OTHER): Payer: Medicare Other | Admitting: Family

## 2016-01-10 VITALS — BP 131/57 | HR 83 | Temp 97.8°F | Ht 71.0 in | Wt 264.2 lb

## 2016-01-10 DIAGNOSIS — R0609 Other forms of dyspnea: Secondary | ICD-10-CM

## 2016-01-10 DIAGNOSIS — E1151 Type 2 diabetes mellitus with diabetic peripheral angiopathy without gangrene: Secondary | ICD-10-CM | POA: Diagnosis not present

## 2016-01-10 DIAGNOSIS — Z86711 Personal history of pulmonary embolism: Secondary | ICD-10-CM

## 2016-01-10 DIAGNOSIS — Z89511 Acquired absence of right leg below knee: Secondary | ICD-10-CM

## 2016-01-10 DIAGNOSIS — R0600 Dyspnea, unspecified: Secondary | ICD-10-CM

## 2016-01-10 DIAGNOSIS — I779 Disorder of arteries and arterioles, unspecified: Secondary | ICD-10-CM

## 2016-01-10 NOTE — Patient Instructions (Signed)
Peripheral Vascular Disease Peripheral vascular disease (PVD) is a disease of the blood vessels that are not part of your heart and brain. A simple term for PVD is poor circulation. In most cases, PVD narrows the blood vessels that carry blood from your heart to the rest of your body. This can result in a decreased supply of blood to your arms, legs, and internal organs, like your stomach or kidneys. However, it most often affects a person's lower legs and feet. There are two types of PVD.  Organic PVD. This is the more common type. It is caused by damage to the structure of blood vessels.  Functional PVD. This is caused by conditions that make blood vessels contract and tighten (spasm). Without treatment, PVD tends to get worse over time. PVD can also lead to acute ischemic limb. This is when an arm or limb suddenly has trouble getting enough blood. This is a medical emergency. CAUSES Each type of PVD has many different causes. The most common cause of PVD is buildup of a fatty material (plaque) inside of your arteries (atherosclerosis). Small amounts of plaque can break off from the walls of the blood vessels and become lodged in a smaller artery. This blocks blood flow and can cause acute ischemic limb. Other common causes of PVD include:  Blood clots that form inside of blood vessels.  Injuries to blood vessels.  Diseases that cause inflammation of blood vessels or cause blood vessel spasms.  Health behaviors and health history that increase your risk of developing PVD. RISK FACTORS  You may have a greater risk of PVD if you:  Have a family history of PVD.  Have certain medical conditions, including:  High cholesterol.  Diabetes.  High blood pressure (hypertension).  Coronary heart disease.  Past problems with blood clots.  Past injury, such as burns or a broken bone. These may have damaged blood vessels in your limbs.  Buerger disease. This is caused by inflamed blood  vessels in your hands and feet.  Some forms of arthritis.  Rare birth defects that affect the arteries in your legs.  Use tobacco.  Do not get enough exercise.  Are obese.  Are age 50 or older. SIGNS AND SYMPTOMS  PVD may cause many different symptoms. Your symptoms depend on what part of your body is not getting enough blood. Some common signs and symptoms include:  Cramps in your lower legs. This may be a symptom of poor leg circulation (claudication).  Pain and weakness in your legs while you are physically active that goes away when you rest (intermittent claudication).  Leg pain when at rest.  Leg numbness, tingling, or weakness.  Coldness in a leg or foot, especially when compared with the other leg.  Skin or hair changes. These can include:  Hair loss.  Shiny skin.  Pale or bluish skin.  Thick toenails.  Inability to get or maintain an erection (erectile dysfunction). People with PVD are more prone to developing ulcers and sores on their toes, feet, or legs. These may take longer than normal to heal. DIAGNOSIS Your health care provider may diagnose PVD from your signs and symptoms. The health care provider will also do a physical exam. You may have tests to find out what is causing your PVD and determine its severity. Tests may include:  Blood pressure recordings from your arms and legs and measurements of the strength of your pulses (pulse volume recordings).  Imaging studies using sound waves to take pictures of   the blood flow through your blood vessels (Doppler ultrasound).  Injecting a dye into your blood vessels before having imaging studies using:  X-rays (angiogram or arteriogram).  Computer-generated X-rays (CT angiogram).  A powerful electromagnetic field and a computer (magnetic resonance angiogram or MRA). TREATMENT Treatment for PVD depends on the cause of your condition and the severity of your symptoms. It also depends on your age. Underlying  causes need to be treated and controlled. These include long-lasting (chronic) conditions, such as diabetes, high cholesterol, and high blood pressure. You may need to first try making lifestyle changes and taking medicines. Surgery may be needed if these do not work. Lifestyle changes may include:  Quitting smoking.  Exercising regularly.  Following a low-fat, low-cholesterol diet. Medicines may include:  Blood thinners to prevent blood clots.  Medicines to improve blood flow.  Medicines to improve your blood cholesterol levels. Surgical procedures may include:  A procedure that uses an inflated balloon to open a blocked artery and improve blood flow (angioplasty).  A procedure to put in a tube (stent) to keep a blocked artery open (stent implant).  Surgery to reroute blood flow around a blocked artery (peripheral bypass surgery).  Surgery to remove dead tissue from an infected wound on the affected limb.  Amputation. This is surgical removal of the affected limb. This may be necessary in cases of acute ischemic limb that are not improved through medical or surgical treatments. HOME CARE INSTRUCTIONS  Take medicines only as directed by your health care provider.  Do not use any tobacco products, including cigarettes, chewing tobacco, or electronic cigarettes. If you need help quitting, ask your health care provider.  Lose weight if you are overweight, and maintain a healthy weight as directed by your health care provider.  Eat a diet that is low in fat and cholesterol. If you need help, ask your health care provider.  Exercise regularly. Ask your health care provider to suggest some good activities for you.  Use compression stockings or other mechanical devices as directed by your health care provider.  Take good care of your feet.  Wear comfortable shoes that fit well.  Check your feet often for any cuts or sores. SEEK MEDICAL CARE IF:  You have cramps in your legs  while walking.  You have leg pain when you are at rest.  You have coldness in a leg or foot.  Your skin changes.  You have erectile dysfunction.  You have cuts or sores on your feet that are not healing. SEEK IMMEDIATE MEDICAL CARE IF:  Your arm or leg turns cold and blue.  Your arms or legs become red, warm, swollen, painful, or numb.  You have chest pain or trouble breathing.  You suddenly have weakness in your face, arm, or leg.  You become very confused or lose the ability to speak.  You suddenly have a very bad headache or lose your vision.   This information is not intended to replace advice given to you by your health care provider. Make sure you discuss any questions you have with your health care provider.   Document Released: 04/02/2004 Document Revised: 03/16/2014 Document Reviewed: 08/03/2013 Elsevier Interactive Patient Education 2016 Elsevier Inc.  

## 2016-01-10 NOTE — Progress Notes (Signed)
[facility] [place]   CC: Follow up peripheral artery occlusive disease  History of Present Illness Patrick Shelton is a 75 y.o. male patient that Dr. Bridgett Larsson has been following for PAD, he returns today for follow up. His sx include B hand and left foot paraesthesia and anesthesia. The patient's treatment regimen currently included: maximal medical management.   Pt denies claudication symptoms in LLE,has some left knee pain. Pt denies non-healing wounds.  Pt denies history of stroke or TIA.  He has a history of PE and atrial fib, takes coumadin for this.  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   He has had dyspnea with walking for the last month, states his mucous is very thick.   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 He has a treadmill at home, is no longer using it. Pt admits to lacking enough motivation to better control his DM, is tired, no energy.  He states that he tries not to sleep due to bad dreams, states he has PTSD, and has thought about getting help from the [employer]; I encouraged him to follow up on this.  He is not walking much due to left LE gout and arthritis pain.   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  He has stopped driving since mid [id number] due to not feeling the pedal controls at his left foot,  has gout in both hands.   Pt Diabetic: Yes, states last A1C was 9.2, he uses an insulin pump.  Pt smoker: former smoker, quit in 1979   Pt meds include:  Statin :Yes  ASA: No  Other anticoagulants/antiplatelets: coumadin    Past Medical History:  Diagnosis Date  . Arthritis Sept. 2015   Rh. Neck and Upper Back  . Arthritis Sept. 2015   Gout-Right Hand  Left knee  . Cancer (HCC)    8 wks Radiation  . Chronic kidney disease   . Diabetes mellitus   . ED (erectile dysfunction)   . Hx of agent Orange exposure    while [place] in [place] during that conflict  . Hyperlipidemia   . Hypertension   . PVD (peripheral vascular disease) ([place])   . PVT (paroxysmal ventricular tachycardia) ([place])   . S/P BKA (below knee amputation) [facility])     Social History Social History  Substance Use Topics  . Smoking status: Former Smoker    Packs/day: 0.25    Years: 10.00    Types: Cigarettes    Quit date: 03/09/1977  . Smokeless tobacco: Never Used  . Alcohol use No    Family History Family History  Problem Relation Age of Onset  . Dementia Mother   . Diabetes Father     Amputation- Bilateral leg  . Peripheral vascular disease Father     Past Surgical History:  Procedure Laterality Date  . AMPUTATION Right 2009   BKA  . PROSTATE SURGERY     8 wks radiation    Allergies  Allergen Reactions  . Prednisone Anaphylaxis and Shortness Of Breath    Current Outpatient Prescriptions  Medication Sig Dispense Refill  . amLODipine (NORVASC) 10 MG tablet Take 10 mg by mouth daily.      . B Complex Vitamins (B COMPLEX PO) Take by mouth.    . Cholecalciferol (VITAMIN D-3) 5000 UNITS TABS Take 5,000 Units by mouth daily.     Marland Kitchen ezetimibe (  ZETIA) 10 MG tablet Take 5 mg by mouth daily.     . hydrochlorothiazide 25 MG tablet Take 25 mg by mouth daily.      Marland Kitchen NOVOLOG 100 UNIT/ML injection Inject into the skin continuous. Via insulin pump  1  . pravastatin (PRAVACHOL) 40 MG tablet Take 40 mg by mouth daily.      Marland Kitchen warfarin (COUMADIN) 2.5 MG tablet TAKE 1 TABLET DAILY OR AS DIRECTED BY COUMADIN CLINIC 90 tablet 0   No current facility-administered medications for this visit.     ROS: See HPI for pertinent positives and negatives.   Physical Examination  Vitals:   01/10/16 1511  BP: (!) 131/57  Pulse: 83  Temp: 97.8 F (36.6 C)  TempSrc: Oral  SpO2: (!) 83%  Weight: 264 lb 3.2 oz (119.8 kg)  Height: 5\' 11"  (1.803 m)   Body mass index is 36.85 kg/m.  General: A&O x 3, WDWN, obese male.  Gait:  slow, deliberate, using rolling walker Eyes: Pupils are equal  Pulmonary: Respirations are somewhat labored with walking, limited air movement in all fields, no wheezes, rales, or rhonchi  Cardiac: regular rhythm, no detected murmur   Carotid Bruits  Left  Right    Negative  Negative   Aorta is not palpable Radial pulses: 1+ palpable bilaterally   VASCULAR EXAM:  Extremities without ischemic changes  without Gangrene; without open wounds. Wearing prosthesis on right BKA.  Left lower leg with 1+ non pitting edema, no erythema, no signs of cellulitis. + chronic venous stasis changes.  LE Pulses  LEFT  RIGHT   FEMORAL palpable palpable  POPLITEAL  not palpable  not palpable   POSTERIOR TIBIAL  not palpable  BKA   DORSALIS PEDIS  ANTERIOR TIBIAL  not palpable BKA    Abdomen: soft, NT, no masses palpated.  Skin: no rashes, no ulcers.  Musculoskeletal: no muscle wasting or atrophy, wearing right BKA prosthesis.  Neurologic: A&O X 3; Appropriate Affect ; SENSATION: normal; MOTOR FUNCTION: moving all extremities equally, motor strength 5/5 in UE's, 4/5 in LE's. Speech is fluent/normal. CN 2-12 grossly intact.     ASSESSMENT: Patrick Shelton is a 75 y.o. 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  He is ambulating with his right BKA prosthesis. He has no ulcers on his right BKA stump, no ischemic changes in his left LE. He has no claudication in his left LE.  His A1C is worsening. He has not smoked since 1979.  Left lower leg with 1+ non pitting edema, no erythema, no signs of cellulitis, + chronic venous stasis changes.  I advised pt to see his PCP, cardiologist, or pulmonologist ASAP re his dyspnea with walking in the last month and 83% SAO2 on RA today. He does not use supplemental O2. Pt states he has a pulse oximeter and his SAO2 last week  was about 92%, is 83% now. He is on coumadin for a history of pulmonary embolism and atrial fib.   While in our office he called his cardiologist office, Dr. Orene Desanctis, and made the first available appointment with a provider, 01/20/16. I advised pt to call [phone] if his dyspnea worsens.   DATA 01/03/16 ABI: Left LE arteries are non-compressible due to calcification secondary to chronic hyperglycemia, waveforms are biphasic.  Left TBI has declined to 0.39 from 0.68 on 11/30/14.   No DVT on venous duplex 06/18/15  PLAN:  Based on the patient's vascular studies and examination, pt will return to clinic in 3 months with left ABI. Daily seated leg exercises discussed and demonstrated.   I discussed in depth with the patient the nature of atherosclerosis, and emphasized the importance of maximal medical management including strict control of blood pressure, blood glucose, and lipid levels, obtaining regular exercise, and continued cessation of smoking.  The patient is aware that without maximal medical management the underlying atherosclerotic disease process will progress, limiting the benefit of any interventions.  The patient was given information about PAD including signs, symptoms, treatment, what symptoms should prompt the patient to seek immediate medical care, and risk reduction measures to take.  Clemon Chambers, RN, MSN, FNP-C [facility] Phone: [phone]  Clinic MD: Bridgett Larsson  01/10/16 3:39 PM

## 2016-01-14 ENCOUNTER — Encounter: Payer: Self-pay | Admitting: Physician Assistant

## 2016-01-15 ENCOUNTER — Ambulatory Visit (INDEPENDENT_AMBULATORY_CARE_PROVIDER_SITE_OTHER): Payer: Medicare Other | Admitting: Pharmacist Clinician (PhC)/ Clinical Pharmacy Specialist

## 2016-01-15 DIAGNOSIS — Z8672 Personal history of thrombophlebitis: Secondary | ICD-10-CM | POA: Diagnosis not present

## 2016-01-15 DIAGNOSIS — Z7901 Long term (current) use of anticoagulants: Secondary | ICD-10-CM | POA: Diagnosis not present

## 2016-01-15 LAB — POCT INR: INR: 2

## 2016-01-17 NOTE — Addendum Note (Signed)
Addended by: Mena Goes on: 01/17/2016 12:40 PM   Modules accepted: Orders

## 2016-01-20 ENCOUNTER — Ambulatory Visit (INDEPENDENT_AMBULATORY_CARE_PROVIDER_SITE_OTHER): Payer: Medicare Other | Admitting: Physician Assistant

## 2016-01-20 ENCOUNTER — Encounter: Payer: Self-pay | Admitting: Physician Assistant

## 2016-01-20 VITALS — BP 126/68 | HR 82 | Ht 71.0 in | Wt 264.0 lb

## 2016-01-20 DIAGNOSIS — I779 Disorder of arteries and arterioles, unspecified: Secondary | ICD-10-CM

## 2016-01-20 DIAGNOSIS — I10 Essential (primary) hypertension: Secondary | ICD-10-CM

## 2016-01-20 DIAGNOSIS — R0609 Other forms of dyspnea: Secondary | ICD-10-CM

## 2016-01-20 DIAGNOSIS — I4510 Unspecified right bundle-branch block: Secondary | ICD-10-CM

## 2016-01-20 DIAGNOSIS — R0600 Dyspnea, unspecified: Secondary | ICD-10-CM

## 2016-01-20 NOTE — Progress Notes (Signed)
Cardiology Office Note   Date:  01/20/2016   ID:  Patrick Shelton, DOB 01/16/1941, MRN [id number]  PCP:  Limmie Patricia, MD  Cardiologist:  Dr Juanetta Snow, PA-C   Chief Complaint  Patient presents with  . Shortness of Breath    History of Present Illness: Patrick Shelton is a 75 y.o. 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  Patrick Shelton presents for evaluation of DOE.   He will get SOB walking around in the house. He has thick mucus and that is worse right now. He coughs at times, but it is white phlegm, not purulent. No fevers. He has minimal LLE edema, denies orthopnea or PND. He uses a walker at home, has PT equipment and exercises from when PT was seeing him.   The day that he was told his O2 was 83%, he went home and checked it, it was 94%. He did not feel his breathing was any worse then than it is now.   There has been no recent change in his breathing. About a year ago, he developed a bad L knee. Until then, he was able to use a treadmill with his artificial leg, but with his other knee very sore, that really hurt his ambulation. Since then, he feels he has sort of gone downhill. His activity level has gradually decreased. His weight is up a little, but was only 4 pounds less a year ago.  He no longer drives because of problems with arthritis, neuropathy and numbness in his L leg/foot. No pain in the muscles of his legs with ambulation, has L knee pain or back pain.    He sleeps poorly. He feels this is related to his depression, hx of [place] service. He feels he never gets adequate rest. He is not currently being treated for depression. Has refused rx. Wants to do it on his own.   He eats  poorly, eats foods like crackers at times. He admits that he eats too many carbs and does not pay attention to what he eats.   Past Medical History:  Diagnosis Date  . Arthritis Sept. 2015   Rh. Neck and Upper Back  . Arthritis Sept. 2015   Gout-Right Hand  Left knee  . Cancer (HCC)    8 wks Radiation  . Chronic kidney disease   . Diabetes mellitus   . ED (erectile dysfunction)   . Hx of agent Orange exposure    while [place] in [place] during that conflict  . Hyperlipidemia   . Hypertension   . PVD (peripheral vascular disease) ([place])   . PVT (paroxysmal ventricular tachycardia) ([place])   . S/P BKA (below knee amputation) [facility])     Past Surgical History:  Procedure Laterality Date  . AMPUTATION Right 2009   BKA  . PROSTATE SURGERY     8 wks radiation    Current Outpatient Prescriptions  Medication Sig Dispense Refill  . B Complex Vitamins (B COMPLEX PO) Take 1 tablet by mouth daily.     . Cholecalciferol (VITAMIN D-3) 5000 UNITS TABS Take 5,000 Units by mouth daily.     Marland Kitchen ezetimibe (ZETIA) 10 MG tablet Take 5  mg by mouth daily.     . hydrochlorothiazide 25 MG tablet Take 25 mg by mouth daily.      Marland Kitchen NOVOLOG 100 UNIT/ML injection Inject into the skin continuous. Via insulin pump  1  . pravastatin (PRAVACHOL) 40 MG tablet Take 40 mg by mouth daily.      . valsartan (DIOVAN) 320 MG tablet Take 320 mg by mouth daily.    Marland Kitchen warfarin (COUMADIN) 2.5 MG tablet TAKE 1 TABLET DAILY OR AS DIRECTED BY COUMADIN CLINIC 90 tablet 0   No current facility-administered medications for this visit.     Allergies:   Prednisone  Social History:  The patient  reports that he quit smoking about 38 years ago. His smoking use included Cigarettes. He has a 2.50 pack-year smoking history. He has never used smokeless tobacco. He reports that he does not drink alcohol or use drugs.   Family History:  The patient's family history includes Dementia in his mother; Diabetes in his father; Peripheral  vascular disease in his father.   ROS:  Please see the history of present illness. All other systems are reviewed and negative.   PHYSICAL EXAM: VS:  BP 126/68   Pulse 82   Ht 5\' 11"  (1.803 m)   Wt 264 lb (119.7 kg)   SpO2 96%   BMI 36.82 kg/m  , BMI Body mass index is 36.82 kg/m. GEN: Well nourished, well developed, male in no acute distress  HEENT: normal for age  Neck: no JVD, no carotid bruit, no masses Cardiac: Irregular R&R; soft murmur, no rubs, or gallops Respiratory: decreased BS bases w/ few scattered rales bilaterally, normal work of breathing GI: soft, nontender, nondistended, + BS MS: no deformity or atrophy; minimal LLE edema; distal pulses are 2+ in upper extremities, s/p R-BKA, LLE w/ difficult to assess pulses. Skin: warm and dry, no rash Neuro:  Strength and sensation are intact Psych: euthymic mood, full affect   EKG:  EKG is ordered today. The ekg ordered today demonstrates SR, HR 82. 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 There was mild   concentric hypertrophy. Systolic function was normal. The   estimated ejection fraction was in the range of 60% to 65%. Wall   motion was normal; there were no regional wall motion   abnormalities. Doppler parameters are consistent with abnormal   left ventricular relaxation (grade 1 diastolic dysfunction). - Aorta: Ascending aortic diameter: 38 mm (S). - Ascending aorta: The ascending aorta was mildly dilated. - Left atrium: The atrium was mildly dilated. - Right ventricle: The cavity size was normal. Wall thickness was   normal. Systolic function was normal. - Tricuspid valve: There was mild regurgitation. - Pulmonic valve: There was mild regurgitation. - Inferior vena cava: The vessel was normal in size. The   respirophasic diameter changes were in the normal range (>= 50%),   consistent with normal central venous pressure.  Recent Labs: No results found  for requested labs within last 8760 hours.    Lipid Panel    Component Value Date/Time   CHOL 144 12/23/2015   TRIG 133    HDL 36 (L)    VLDL 27 (H)    LDLCALC 81      Wt Readings from Last 3 Encounters:  01/20/16 264 lb (119.7 kg)  01/10/16 264 lb 3.2 oz (119.8 kg)  01/03/16 262 lb (118.8 kg)     Other studies Reviewed: Additional  studies/ records that were reviewed today include: office notes, previous testing .  ASSESSMENT AND PLAN:  1.  DOE: Sx have been gradually worsening, without any appreciable difference in his general medical condition. It is unclear why his O2 sats were low in the [facility] office the other day, they are not low here today and his weight is no different. Continue current therapy including HCTZ 25 mg qd. Do not feel he needs further diuresis.  I am afraid he has deconditioning and obesity and major contributing factors towards his DOE. He is undoubtedly getting weaker. Depression may be playing a role in his motivation to be more active, but he does not wish to pursue additional treatment for that.   For now, he is to keep an activity/exercise log of what he does in the house to increase his strength. He wishes to try lifestyle modifications before going to see Dr Melvyn Novas and Dr Sallyanne Kuster. If he does this consistently and does not improve, he may need PFTs or CPX. Follow up with Dr Melvyn Novas and Dr Sallyanne Kuster.   2. DM: Discussed the foods he eats and how they contribute to his weight and his high A1c (9.4). He is encouraged to make better food choices and follow up with his DM doctor and the nutritionist. He needs to find foods he likes that are appropriate for diabetics.  3. 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 He is not having ongoing ischemic symptoms.   Dr Vivia Ewing to review and advise if any further workup is needed.    Current medicines are reviewed at length with the patient today.  The patient does not have concerns  regarding medicines.  The following changes have been made:  no change  Labs/ tests ordered today include:  No orders of the defined types were placed in this encounter.    Disposition:   FU with Dr Sallyanne Kuster  Signed, Lenoard Aden  01/20/2016 2:37 PM    [place] Phone: [phone]; Fax: [phone]  This note was written with the assistance of speech recognition software. Please excuse any transcriptional errors.

## 2016-01-20 NOTE — Progress Notes (Signed)
Agree. Focus is first on weight loss and bettr glycemic control. Ultimately we may need to pursue R and L heart cath or CPX.

## 2016-01-20 NOTE — Patient Instructions (Signed)
Medication Instructions:  Continue current medications  Labwork: None Ordered  Testing/Procedures: None Ordered  Follow-Up: Your physician recommends that you schedule a follow-up appointment in: 3 Months with Dr Sallyanne Kuster   Any Other Special Instructions Will Be Listed Below (If Applicable).           Happy Thanksgiving  Keep an activity/exercise log and bring it with you at your next appointment Stick tightly to a diabetic diet, use the Internet to find food you like  Dr Melvyn Novas # [phone]  If you need a refill on your cardiac medications before your next appointment, please call your pharmacy.

## 2016-01-23 NOTE — Telephone Encounter (Signed)
Per last office note with Rosaria Ferries, PA, patient is taking valsartan.

## 2016-02-02 ENCOUNTER — Other Ambulatory Visit: Payer: Self-pay | Admitting: Cardiovascular Disease

## 2016-02-12 IMAGING — MR MR SHOULDER*R* W/O CM
4 of 5 series · 19 of 40 positions shown · non-contrast
Comparison: None.

CLINICAL DATA: Pulling injury right shoulder 1 week ago. Severe
right shoulder pain.

EXAM:
MRI OF THE RIGHT SHOULDER WITHOUT CONTRAST
TECHNIQUE: Multiplanar, multisequence MR imaging of the shoulder was performed.
No intravenous contrast was administered.

[Series 3: T2 fat-sat · oblique · 4.0mm · 0.27mm/px · 6 of 19 slices shown (1 of 3)]
[im 1/19]
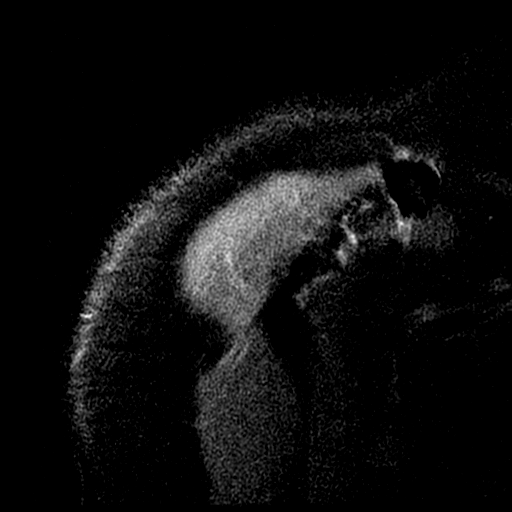
[im 4/19]
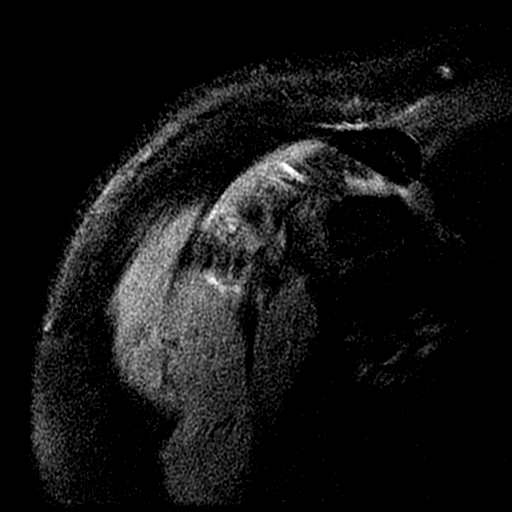
[im 8/19]
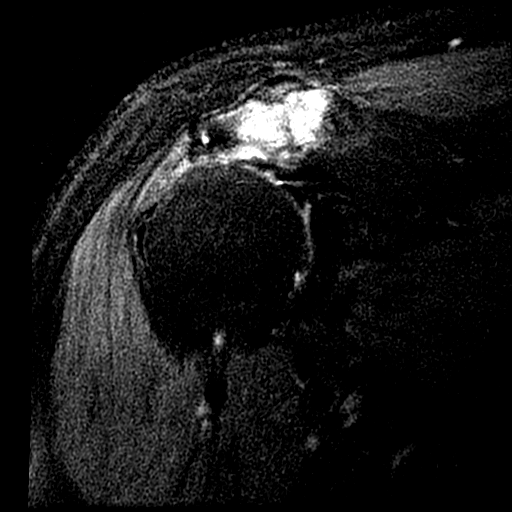
[im 11/19]
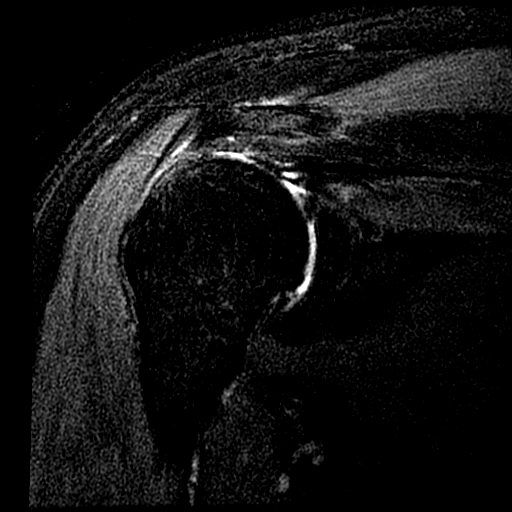
[im 15/19]
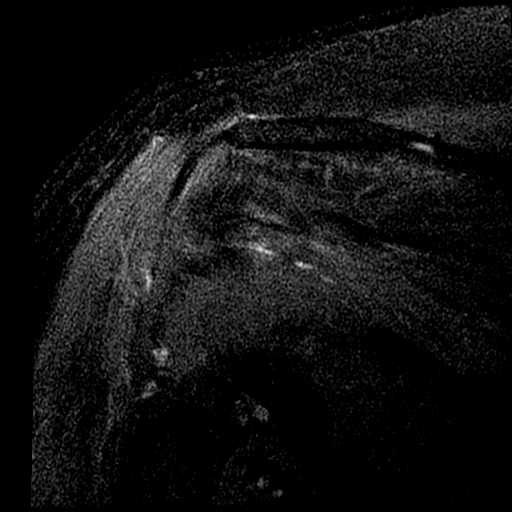
[im 19/19]
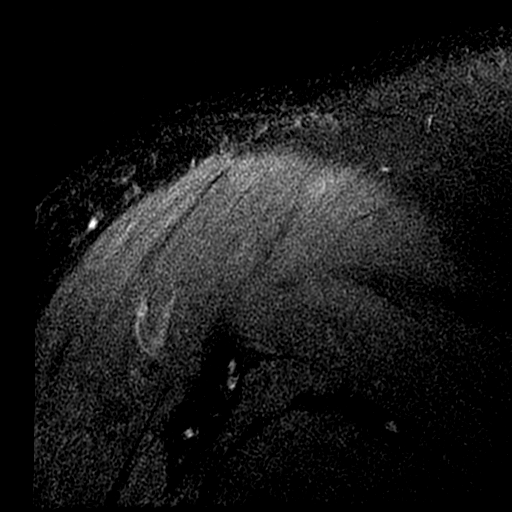

[Series 4: PD · oblique · 4.0mm · 0.27mm/px · 7 of 19 slices shown]
[im 1/19]
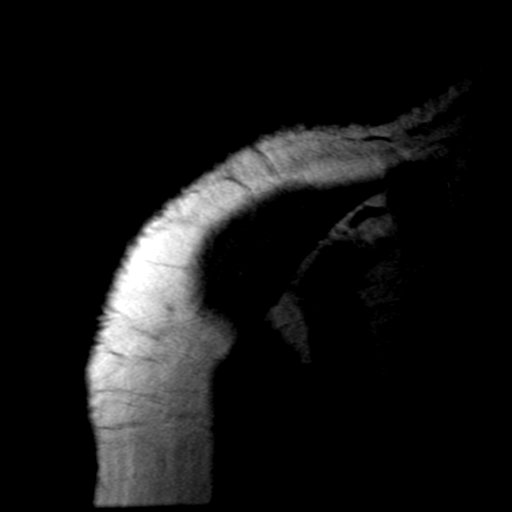
[im 4/19]
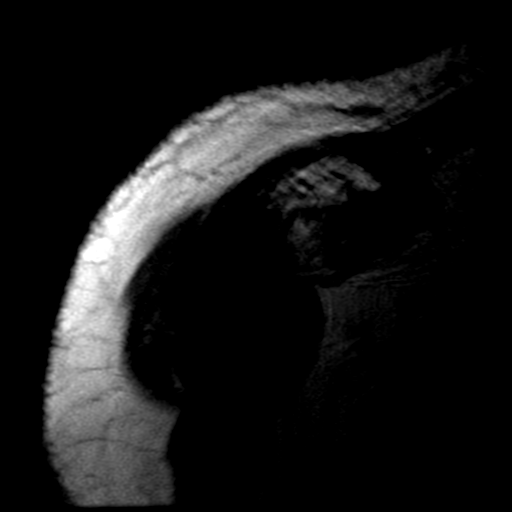
[im 7/19]
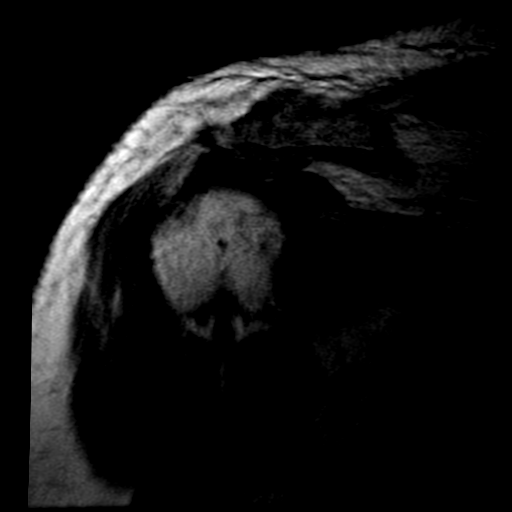
[im 10/19]
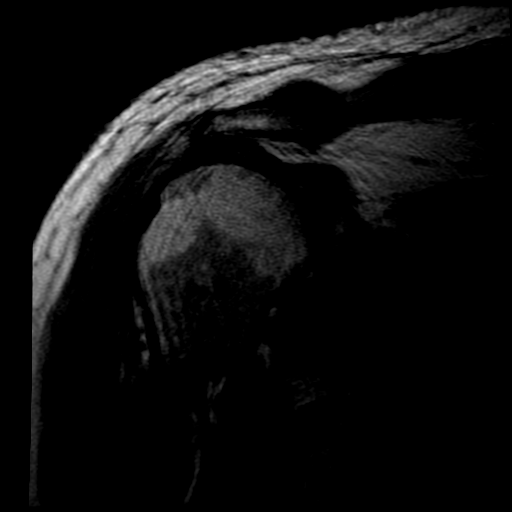
[im 13/19]
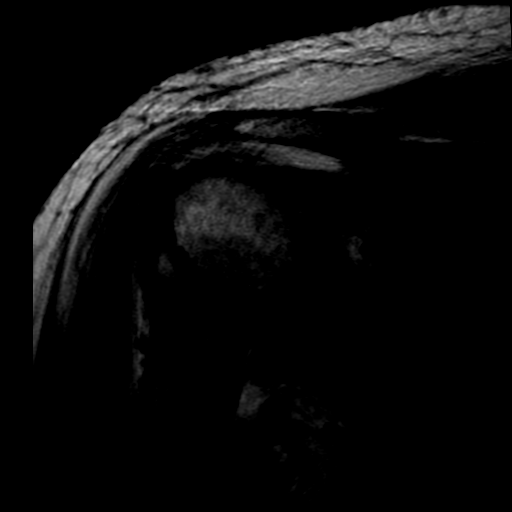
[im 16/19]
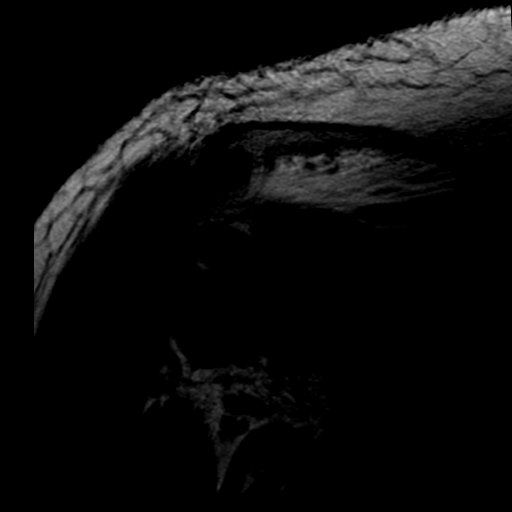
[im 19/19]
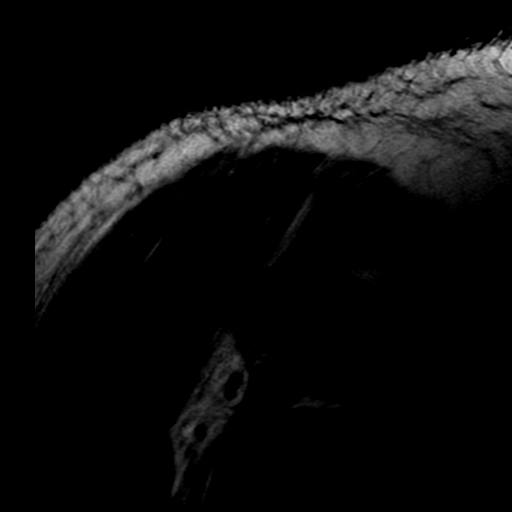

[Series 5: T2 fat-sat · axial · 4.0mm · 0.23mm/px · z∈[-21,+64]mm · 3 of 23 slices shown (2 of 3)]
[im 3/23]
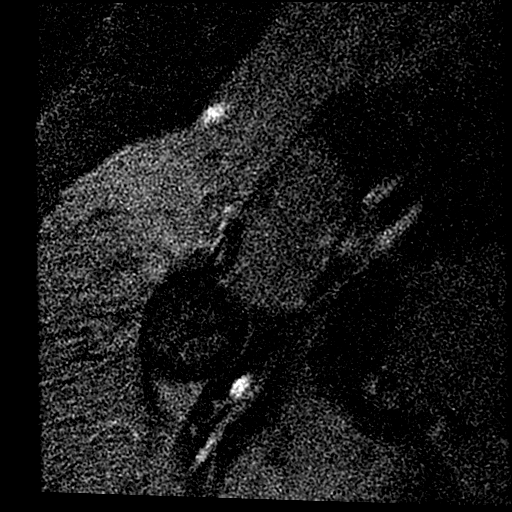
[im 12/23]
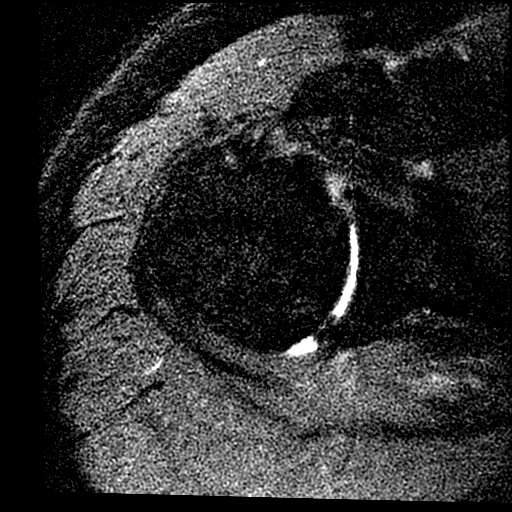
[im 20/23]
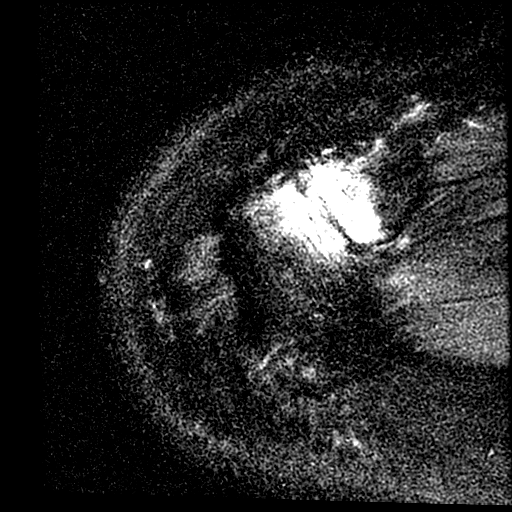

[Series 6: T2 fat-sat · oblique · 4.0mm · 0.27mm/px · 3 of 23 slices shown (3 of 3)]
[im 3/23]
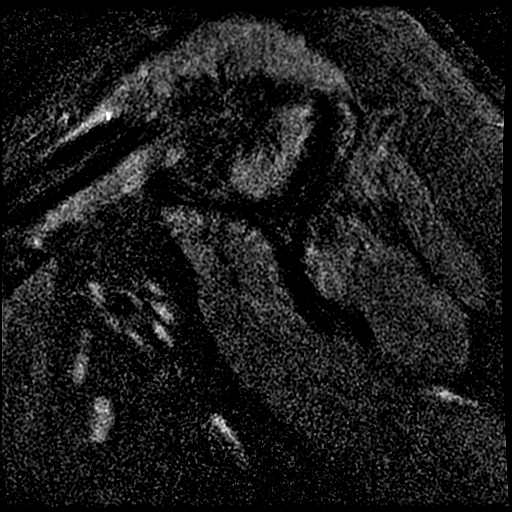
[im 12/23]
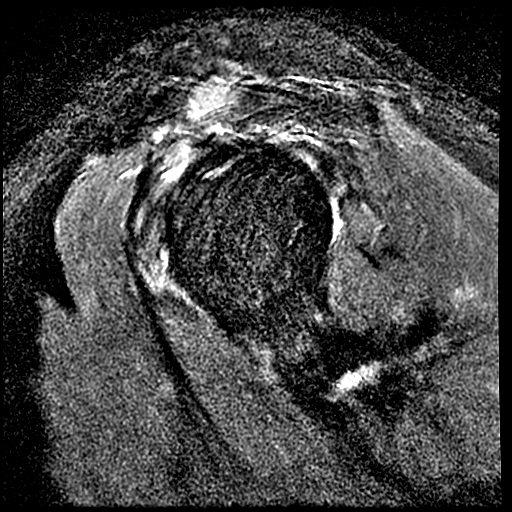
[im 20/23]
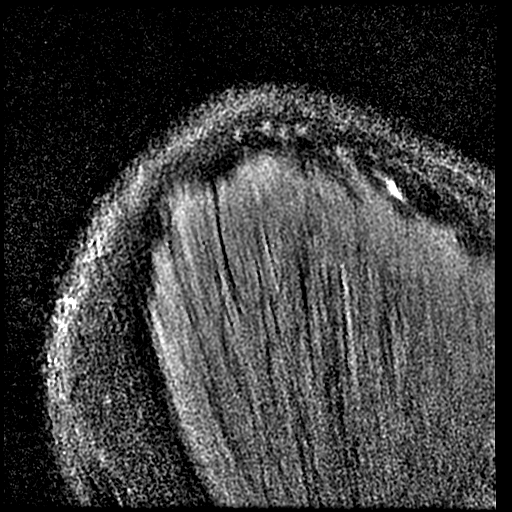

[19 of 40 positions shown; findings below may reference images not displayed]

FINDINGS: Patient motion degrades the study.

Rotator cuff: The supraspinatus tendon is completely torn and
retracted to the level of the glenoid, 5-6 cm. There is
infraspinatus and subscapularis tendinopathy. Changes appear worse
in the subscapularis but no discrete tear is identified.

Muscles: Fatty atrophy of both the supraspinatus and infraspinatus
is identified. No focal lesion is seen.

Biceps long head:  Poorly seen due to motion but appears intact.

Acromioclavicular Joint: Bulky degenerative change is present with
intense marrow edema about the joint. The joint is intact.

Glenohumeral Joint: Unremarkable.

Labrum:  No tear is identified.

Bones: The acromion is type 2 and there subacromial spurring. No
fracture or worrisome marrow lesion.
IMPRESSION: Rotator cuff tendinopathy with a complete supraspinatus tendon tear.
The tendon is retracted 5 6 cm. The subscapularis and infraspinatus
are intact.

Fatty atrophy of the supraspinatus and infraspinatus.

Acromioclavicular osteoarthritis with intense marrow edema about the
joint. Subacromial spur is also identified.

## 2016-02-21 ENCOUNTER — Ambulatory Visit (INDEPENDENT_AMBULATORY_CARE_PROVIDER_SITE_OTHER): Payer: Medicare Other | Admitting: Pharmacist

## 2016-02-21 ENCOUNTER — Ambulatory Visit: Payer: Medicare Other | Admitting: Cardiovascular Disease

## 2016-02-21 ENCOUNTER — Encounter: Payer: Self-pay | Admitting: Cardiovascular Disease

## 2016-02-21 ENCOUNTER — Ambulatory Visit (INDEPENDENT_AMBULATORY_CARE_PROVIDER_SITE_OTHER): Payer: Medicare Other | Admitting: Cardiovascular Disease

## 2016-02-21 VITALS — BP 148/70 | HR 71 | Ht 71.0 in | Wt 262.8 lb

## 2016-02-21 DIAGNOSIS — E1122 Type 2 diabetes mellitus with diabetic chronic kidney disease: Secondary | ICD-10-CM

## 2016-02-21 DIAGNOSIS — I10 Essential (primary) hypertension: Secondary | ICD-10-CM

## 2016-02-21 DIAGNOSIS — Z7901 Long term (current) use of anticoagulants: Secondary | ICD-10-CM

## 2016-02-21 DIAGNOSIS — I2584 Coronary atherosclerosis due to calcified coronary lesion: Secondary | ICD-10-CM

## 2016-02-21 DIAGNOSIS — Z8672 Personal history of thrombophlebitis: Secondary | ICD-10-CM

## 2016-02-21 DIAGNOSIS — I739 Peripheral vascular disease, unspecified: Secondary | ICD-10-CM

## 2016-02-21 DIAGNOSIS — I779 Disorder of arteries and arterioles, unspecified: Secondary | ICD-10-CM

## 2016-02-21 DIAGNOSIS — I2510 Atherosclerotic heart disease of native coronary artery without angina pectoris: Secondary | ICD-10-CM | POA: Diagnosis not present

## 2016-02-21 DIAGNOSIS — R0602 Shortness of breath: Secondary | ICD-10-CM | POA: Diagnosis not present

## 2016-02-21 DIAGNOSIS — N184 Chronic kidney disease, stage 4 (severe): Secondary | ICD-10-CM

## 2016-02-21 LAB — POCT INR: INR: 2.1

## 2016-02-21 MED ORDER — IPRATROPIUM-ALBUTEROL 20-100 MCG/ACT IN AERS: 1.0000 | INHALATION_SPRAY | Freq: Three times a day (TID) | RESPIRATORY_TRACT | 6 refills | Status: DC

## 2016-02-21 NOTE — Progress Notes (Signed)
Cardiology Office Note    Date:  02/22/2016   ID:  Patrick Shelton, DOB 1940/09/06, MRN [id number]  PCP:  Limmie Patricia, MD  Cardiologist:   Sanda Klein, MD   Chief Complaint  Patient presents with  . Follow-up  . Shortness of Breath    some    History of Present Illness:  Patrick Shelton is a 75 y.o. male with history of peripheral arterial disease, previous right leg BKA, hyperlipidemia, hypertension, insulin-requiring diabetes mellitus, gout, history of recurrent pulmonary embolism on chronic warfarin anticoagulation. He has known coronary artery calcification by previous CT but had a normal nuclear stress test and has not had angina pectoris.  Patrick Shelton biggest complaint is daily cough productive of thick, tenacious mucuos sputum. He believes this is due to postnasal drip. Coughing makes him short of breath. He denies wheezing. He does not have orthopnea and has not had problems with edema. He has not seen his pulmonary specialist in 12 months. He does not smoke and is no longer taking ACE inhibitors. Stopping the ACE inhibitor did not seem to have any positive impact on his cough or breathing. He has not had issues with hemoptysis and has not had recent fever, chills or symptoms of upper respiratory tract infection. He complains that he has gained weight gradually over time, but has not really changed his weight in the last 2 years.    He continues to have neuropathic symptoms in his hands and in his left foot and has occasional phantom right foot pain.   Past Medical History:  Diagnosis Date  . Arthritis Sept. 2015   Rh. Neck and Upper Back  . Arthritis Sept. 2015   Gout-Right Hand  Left knee  . Cancer (HCC)    8 wks Radiation  . Chronic kidney disease   . Diabetes mellitus   . ED (erectile dysfunction)   . Hx of agent Orange exposure    [place] during that conflict  . Hyperlipidemia   . Hypertension   . PVD (peripheral vascular disease)  ([place])   . PVT (paroxysmal ventricular tachycardia) ([place])   . S/P BKA (below knee amputation) [facility])     Past Surgical History:  Procedure Laterality Date  . AMPUTATION Right 2009   BKA  . PROSTATE SURGERY     8 wks radiation    Current Medications: Outpatient Medications Prior to Visit  Medication Sig Dispense Refill  . B Complex Vitamins (B COMPLEX PO) Take 1 tablet by mouth daily.     . Cholecalciferol (VITAMIN D-3) 5000 UNITS TABS Take 5,000 Units by mouth daily.     Marland Kitchen ezetimibe (ZETIA) 10 MG tablet Take 5 mg by mouth daily.     . hydrochlorothiazide 25 MG tablet Take 25 mg by mouth daily.      Marland Kitchen NOVOLOG 100 UNIT/ML injection Inject into the skin continuous. Via insulin pump  1  . pravastatin (PRAVACHOL) 40 MG tablet Take 40 mg by mouth daily.      . valsartan (DIOVAN) 320 MG tablet Take 320 mg by mouth daily.    Marland Kitchen warfarin (COUMADIN) 2.5 MG tablet Take 1/2 to 1 tablet by mouth daily as directed by coumadin clinic 90 tablet 0   No facility-administered medications prior to visit.      Allergies:   Prednisone   Social History   Social History  . Marital status: Married    Spouse name: N/A  . Number of children: N/A  . Years  of education: N/A   Social History Main Topics  . Smoking status: Former Smoker    Packs/day: 0.25    Years: 10.00    Types: Cigarettes    Quit date: 03/09/1977  . Smokeless tobacco: Never Used  . Alcohol use No  . Drug use: No  . Sexual activity: Not Asked   Other Topics Concern  . None   Social History Narrative  . None     Family History:  The patient's family history includes Dementia in his mother; Diabetes in his father; Peripheral vascular disease in his father.   ROS:   Please see the history of present illness.    ROS All other systems reviewed and are negative.   PHYSICAL EXAM:   VS:  BP (!) 148/70   Pulse 71   Ht 5\' 11"  (1.803 m)   Wt 262 lb 12.8 oz (119.2 kg)   SpO2 94%   BMI 36.65 kg/m    GEN: Well nourished, well  developed, in no acute distress  HEENT: normal  Neck: no JVD, carotid bruits, or masses Cardiac: RRR; no murmurs, rubs, or gallops,no edema  Respiratory:  clear to auscultation bilaterally, normal work of breathing GI: soft, nontender, nondistended, + BS MS: s/p R BKA Skin: warm and dry, no rash Neuro:  Alert and Oriented x 3, Strength and sensation are intact Psych: euthymic mood, full affect  Wt Readings from Last 3 Encounters:  02/21/16 262 lb 12.8 oz (119.2 kg)  01/20/16 264 lb (119.7 kg)  01/10/16 264 lb 3.2 oz (119.8 kg)      Studies/Labs Reviewed:   EKG:  EKG is not ordered today.  The ekg ordered November 13 shows sinus rhythm with frequent PACs and a new right bundle branch block  Notes from Dr. Elyse Hsu from January 2017 show some deterioration in glycemic control with the last hemoglobin A1c 9.4%. Most recent lipid profile from January shows cholesterol 152, triglycerides 162, HDL 38, LDL 82 Creatinine is hovering in the 1.5-1.6 range ASSESSMENT:    1. Shortness of breath   2. Coronary artery calcification   3. Diabetes mellitus with stage 4 chronic kidney disease ([place])   4. Essential hypertension   5. PAD (peripheral artery disease) (HCC)      PLAN:  In order of problems listed above:  1. Cough/dyspnea: No convincing evidence that his problems are related to heart failure. He is producing thick mucus sputum. Will try to see how he does with inhaled bronchodilators and an antihistamine. He is clearly deconditioned. Will check PFTs. Encouraged him to return to see his pulmonologist. 2. Coronary calcification: He does not have angina pectoris. Previous functional study was normal. Left ventricular systolic function and regional wall motion was normal by echo in December 2016. Continue with risk factor modification. 3. DM: Worsening glycemic control. Stable renal function 4. HTN: Rechecked blood pressure today was 128/66, within desirable range. 5. PAD s/p R  BKA.    Medication Adjustments/Labs and Tests Ordered: Current medicines are reviewed at length with the patient today.  Concerns regarding medicines are outlined above.  Medication changes, Labs and Tests ordered today are listed in the Patient Instructions below. Patient Instructions  Medication Instructions: Dr Sallyanne Kuster has recommended making the following medication changes: 1. START Ipratropium-Albuterol - inhale 1 puff three times daily 2. START Claritin 10 mg - take 1 tablet by mouth daily  >>This is an over-the-counter medication  Labwork: NONE ORDERED  Testing/Procedures: 1. Pulmonary Functions tests - Your physician has  recommended that you have a pulmonary function test. Pulmonary Function Tests are a group of tests that measure how well air moves in and out of your lungs. This will be performed at [facility].  Follow-up: Dr Sallyanne Kuster recommends that you schedule a follow-up appointment after testing.  If you need a refill on your cardiac medications before your next appointment, please call your pharmacy.    Signed, Sanda Klein, MD  02/22/2016 2:43 PM    [address], [place], [place]  [address] Phone: [phone]; Fax: [phone]

## 2016-02-21 NOTE — Patient Instructions (Signed)
Medication Instructions: Dr Sallyanne Kuster has recommended making the following medication changes: 1. START Ipratropium-Albuterol - inhale 1 puff three times daily 2. START Claritin 10 mg - take 1 tablet by mouth daily  >>This is an over-the-counter medication  Labwork: NONE ORDERED  Testing/Procedures: 1. Pulmonary Functions tests - Your physician has recommended that you have a pulmonary function test. Pulmonary Function Tests are a group of tests that measure how well air moves in and out of your lungs. This will be performed at [facility].  Follow-up: Dr Sallyanne Kuster recommends that you schedule a follow-up appointment after testing.  If you need a refill on your cardiac medications before your next appointment, please call your pharmacy.

## 2016-02-22 DIAGNOSIS — I2510 Atherosclerotic heart disease of native coronary artery without angina pectoris: Secondary | ICD-10-CM | POA: Insufficient documentation

## 2016-02-22 DIAGNOSIS — I2584 Coronary atherosclerosis due to calcified coronary lesion: Secondary | ICD-10-CM

## 2016-02-24 ENCOUNTER — Telehealth: Payer: Self-pay | Admitting: *Deleted

## 2016-02-24 NOTE — Telephone Encounter (Signed)
Spoke with patient regarding appointment for PFT's .  Scheduled 03/04/16 @ 3:00 pm arrive at 2:45 pm 1st floor admitting ----No caffeine -No smoking and no breathing medis 4 hours prior to appointment time.  Patient voiced his understanding.

## 2016-03-04 ENCOUNTER — Encounter (HOSPITAL_COMMUNITY): Payer: Medicare Other

## 2016-03-04 ENCOUNTER — Ambulatory Visit (HOSPITAL_COMMUNITY)
Admission: RE | Admit: 2016-03-04 | Discharge: 2016-03-04 | Disposition: A | Discharge status: Home / Self Care | Payer: Medicare Other | Source: Ambulatory Visit | Attending: Cardiovascular Disease | Admitting: Cardiovascular Disease

## 2016-03-04 DIAGNOSIS — R0602 Shortness of breath: Secondary | ICD-10-CM | POA: Diagnosis not present

## 2016-03-04 LAB — PULMONARY FUNCTION TEST
DL/VA % PRED: 113 %
DL/VA: 5.29 ml/min/mmHg/L
DLCO UNC: 13.92 ml/min/mmHg
DLCO unc % pred: 41 %
FEF 25-75 Post: 1.03 L/sec
FEF 25-75 Pre: 2.21 L/sec
FEF2575-%CHANGE-POST: -53 %
FEF2575-%Pred-Post: 43 %
FEF2575-%Pred-Pre: 93 %
FEV1-%CHANGE-POST: -11 %
FEV1-%PRED-POST: 46 %
FEV1-%PRED-PRE: 52 %
FEV1-PRE: 1.52 L
FEV1-Post: 1.34 L
FEV1FVC-%CHANGE-POST: 4 %
FEV1FVC-%Pred-Pre: 115 %
FEV6-%Change-Post: -15 %
FEV6-%PRED-PRE: 47 %
FEV6-%Pred-Post: 39 %
FEV6-PRE: 1.75 L
FEV6-Post: 1.47 L
FEV6FVC-%PRED-PRE: 105 %
FEV6FVC-%Pred-Post: 105 %
FVC-%Change-Post: -15 %
FVC-%PRED-PRE: 45 %
FVC-%Pred-Post: 37 %
FVC-POST: 1.47 L
FVC-PRE: 1.75 L
POST FEV1/FVC RATIO: 91 %
POST FEV6/FVC RATIO: 100 %
PRE FEV6/FVC RATIO: 100 %
Pre FEV1/FVC ratio: 87 %

## 2016-03-04 MED ORDER — ALBUTEROL SULFATE (2.5 MG/3ML) 0.083% IN NEBU
2.5000 mg | INHALATION_SOLUTION | Freq: Once | RESPIRATORY_TRACT | Status: AC
  Administered 2016-03-04: 2.5 mg via RESPIRATORY_TRACT

## 2016-03-05 ENCOUNTER — Telehealth: Payer: Self-pay | Admitting: *Deleted

## 2016-03-05 NOTE — Telephone Encounter (Signed)
Called  @ 11:27am and left message regarding appointment with Dr. Melvyn Novas.Thursday 03/12/16 @ 2:30pm.  Ask patient to call our office to confirm and if appointment not convenient--call Dr. Gustavus Bryant office [phone].

## 2016-03-12 ENCOUNTER — Ambulatory Visit: Payer: Medicare Other | Admitting: Internal Medicine

## 2016-03-18 NOTE — Progress Notes (Signed)
appt scheduled

## 2016-04-03 ENCOUNTER — Encounter: Payer: Self-pay | Admitting: Internal Medicine

## 2016-04-03 ENCOUNTER — Ambulatory Visit (INDEPENDENT_AMBULATORY_CARE_PROVIDER_SITE_OTHER)
Admission: RE | Admit: 2016-04-03 | Discharge: 2016-04-03 | Disposition: A | Discharge status: Home / Self Care | Payer: Medicare Other | Source: Ambulatory Visit | Attending: Internal Medicine | Admitting: Internal Medicine

## 2016-04-03 ENCOUNTER — Other Ambulatory Visit (INDEPENDENT_AMBULATORY_CARE_PROVIDER_SITE_OTHER): Payer: Medicare Other

## 2016-04-03 ENCOUNTER — Ambulatory Visit (INDEPENDENT_AMBULATORY_CARE_PROVIDER_SITE_OTHER): Payer: Medicare Other | Admitting: Internal Medicine

## 2016-04-03 VITALS — BP 102/64 | HR 70 | Ht 71.0 in | Wt 280.0 lb

## 2016-04-03 DIAGNOSIS — R053 Chronic cough: Secondary | ICD-10-CM

## 2016-04-03 DIAGNOSIS — R05 Cough: Secondary | ICD-10-CM

## 2016-04-03 DIAGNOSIS — R0609 Other forms of dyspnea: Secondary | ICD-10-CM

## 2016-04-03 DIAGNOSIS — R0600 Dyspnea, unspecified: Secondary | ICD-10-CM

## 2016-04-03 LAB — BRAIN NATRIURETIC PEPTIDE: PRO B NATRI PEPTIDE: 58 pg/mL (ref 0.0–100.0)

## 2016-04-03 LAB — CBC WITH DIFFERENTIAL/PLATELET
BASOS ABS: 0 10*3/uL (ref 0.0–0.1)
Basophils Relative: 0.2 % (ref 0.0–3.0)
Eosinophils Absolute: 0.3 10*3/uL (ref 0.0–0.7)
Eosinophils Relative: 2.5 % (ref 0.0–5.0)
HCT: 38 % — ABNORMAL LOW (ref 39.0–52.0)
Hemoglobin: 12.8 g/dL — ABNORMAL LOW (ref 13.0–17.0)
LYMPHS ABS: 4 10*3/uL (ref 0.7–4.0)
Lymphocytes Relative: 30.8 % (ref 12.0–46.0)
MCHC: 33.6 g/dL (ref 30.0–36.0)
MCV: 87.6 fl (ref 78.0–100.0)
MONOS PCT: 5.1 % (ref 3.0–12.0)
Monocytes Absolute: 0.7 10*3/uL (ref 0.1–1.0)
NEUTROS ABS: 7.9 10*3/uL — AB (ref 1.4–7.7)
NEUTROS PCT: 61.4 % (ref 43.0–77.0)
Platelets: 206 10*3/uL (ref 150.0–400.0)
RBC: 4.34 Mil/uL (ref 4.22–5.81)
RDW: 14.6 % (ref 11.5–15.5)
WBC: 12.9 10*3/uL — ABNORMAL HIGH (ref 4.0–10.5)

## 2016-04-03 LAB — BASIC METABOLIC PANEL
BUN: 46 mg/dL — ABNORMAL HIGH (ref 6–23)
CALCIUM: 9.4 mg/dL (ref 8.4–10.5)
CHLORIDE: 105 meq/L (ref 96–112)
CO2: 25 mEq/L (ref 19–32)
CREATININE: 1.51 mg/dL — AB (ref 0.40–1.50)
GFR: 58.16 mL/min — AB (ref >60.00)
Glucose, Bld: 199 mg/dL — ABNORMAL HIGH (ref 70–99)
Potassium: 4.8 mEq/L (ref 3.5–5.1)
Sodium: 137 mEq/L (ref 135–145)

## 2016-04-03 LAB — NITRIC OXIDE: Nitric Oxide: 15

## 2016-04-03 LAB — TSH: TSH: 2.06 u[IU]/mL (ref 0.35–4.50)

## 2016-04-03 MED ORDER — FAMOTIDINE 20 MG PO TABS: ORAL_TABLET | ORAL | 2 refills | Status: DC

## 2016-04-03 MED ORDER — PANTOPRAZOLE SODIUM 40 MG PO TBEC: 40.0000 mg | DELAYED_RELEASE_TABLET | Freq: Every day | ORAL | 2 refills | Status: DC

## 2016-04-03 NOTE — Patient Instructions (Addendum)
Protonix (pantoprazole) Take 30-60 min before first meal of the day and Pepcid 20 mg one bedtime plus chlorpheniramine 4 mg x 1- 2 at bedtime (both available over the counter)  until  Return to office  GERD (REFLUX)  is an extremely common cause of respiratory symptoms, many times with no significant heartburn at all.    It can be treated with medication, but also with lifestyle changes including avoidance of late meals, excessive alcohol, smoking cessation, and avoid fatty foods, chocolate, peppermint, colas, red wine, and acidic juices such as orange juice.  NO MINT OR MENTHOL PRODUCTS SO NO COUGH DROPS   USE HARD CANDY INSTEAD (jolley ranchers or Stover's or Lifesavers (all available in sugarless versions) NO OIL BASED VITAMINS - use powdered substitutes.     Please remember to go to the lab and x-ray department downstairs in the basement  for your tests - we will call you with the results when they are available.  Please schedule a follow up office visit in 4 weeks, sooner if needed

## 2016-04-03 NOTE — Progress Notes (Signed)
Subjective:    Patient ID: Patrick Shelton, male    DOB: 1941/02/15, MRN: [id number]   Brief patient profile:  75 yobm quit smoking 1979 no resp problems  then abruptly 2010 onset daily cough referred to pulmonary clinic 02/18/2015 by Dr Patrick Shelton.    History of Present Illness  02/18/2015 1st [facility] office visit/ Patrick Shelton   Chief Complaint  Patient presents with  . Pulmonary Consult    Referred by Dr. Sallyanne Shelton. Pt c/o cough x 6 yrs- started after dx of PE. Cough is prod with thick, clear sputum and is esp worse when he lies down. He also c/o SOB- sometimes at rest and other times when walking short distance on flat surface.   abruptly  3 weeks p R Bka 2010 Then dx pe then 2 weeks later developed daily cough worse w/in an hour of hs better if keeps a cough drop in place otherwise  waking up all night long has to get up due to sense of pnds  Best option is water drinking/ inhalers no better  Doe x 50 -100 ft  rec The key to effective treatment for your cough is eliminating the non-stop cycle of cough   Protonix (pantoprazole) Take 30-60 min before first meal of the day and Pepcid 20 mg one bedtime plus chlorpheniramine 4 mg x 2 at bedtime (both available over the counter)  until cough is completely gone for at least a week without the need for cough suppression GERD diet  Start the new blood pressure pill today (Diovan 320) Please schedule a follow up office visit in 4 weeks, sooner if needed > did not return as instructed  > pt says much better to point it did not disturb sleep, did not require any lozenges but still had some sense of a throat tickle      04/03/2016   Pulmonary reconsult re SOB/cough  Chief Complaint  Patient presents with  . Pulmonary Consult    Referred by Dr. Orene Shelton.  Pt c/o SOB for the past 6 months, progressively getting worse.  He gets winded walking approx 20 ft. He is also coughing more and producing some thick, white sputum.  Cough wakes him up in the  night.  breathing did not improve when the cough improved p last ov then 6 month  Both Breathing  and coughing worse again with noct component of cough returning and disturbing sleep with thick white mucus production esp in ams  Was given combivent but does not know how/ when to use it so hasn't tried it yet  No obvious day to day or daytime variability or assoc purulent sputum or mucus plugs or hemoptysis or cp or chest tightness, subjective wheeze or overt sinus or hb symptoms. No unusual exp hx or h/o childhood pna/ asthma or knowledge of premature birth.   Also denies any obvious fluctuation of symptoms with weather or environmental changes or other aggravating or alleviating factors except as outlined above   Current Medications, Allergies, Complete Past Medical History, Past Surgical History, Family History, and Social History were reviewed in [employer] record.  ROS  The following are not active complaints unless bolded sore throat, dysphagia, dental problems, itching, sneezing,  nasal congestion or excess/ purulent secretions, ear ache,   fever, chills, sweats, unintended wt loss, classically pleuritic or exertional cp,  orthopnea pnd or leg swelling, presyncope, palpitations, abdominal pain, anorexia, nausea, vomiting, diarrhea  or change in bowel or bladder habits, change in stools or  urine, dysuria,hematuria,  rash, arthralgias, visual complaints, headache, numbness, weakness or ataxia or problems with walking - walks with cane which he didn't bring to office or coordination,  change in mood/affect or memory.                                Objective:   Physical Exam  Elderly bm w/c bound nad/ very unusual affect - extremely evasive with questions re symptoms eg " I''ve alwaysgot mucus but I don't always cough"    04/03/2016         280   02/18/15 261 lb 6.4 oz (118.57 kg)  01/25/15 262 lb 11.2 oz (119.16 kg)  11/30/14 260 lb (117.935 kg)      Vital signs reviewed - Note on arrival 02 sats  99% on RA    HEENT: nl dentition, turbinates, and oropharynx. Nl external ear canals without cough reflex   NECK :  without JVD/Nodes/TM/ nl carotid upstrokes bilaterally   LUNGS: no acc muscle use,  Nl contour chest which is clear to A and P bilaterally without cough on insp or exp maneuvers   CV:  RRR  no s3 or murmur or increase in P2, no edema   ABD:  soft and nontender with nl inspiratory excursion in the supine position. No bruits or organomegaly, bowel sounds nl  MS:  S/p R BKA  With appropriate  gait/  L ext warm   calf tenderness, cyanosis or clubbing No obvious joint restrictions   SKIN: warm and dry without lesions    NEURO:  alert, depressed affect , nl sensorium with  no motor deficits       CXR PA and Lateral:   04/03/2016 :    I personally reviewed images and agree with radiology impression as follows:   Cardiac shadow is enlarged but stable. The overall inspiratory effort is stable. Some chronic scarring noted in the bases bilaterally. Chronic granulomas are noted. No acute bony abnormality is seen.    Labs ordered/ reviewed:     Chemistry      Component Value Date/Time   NA 137 04/03/2016 1555   K 4.8 04/03/2016 1555   CL 105 04/03/2016 1555   CO2 25 04/03/2016 1555   BUN 46 (H) 04/03/2016 1555   CREATININE 1.51 (H) 04/03/2016 1555      Component Value Date/Time   CALCIUM 9.4 04/03/2016 1555   ALKPHOS 96 04/03/2014 1901   AST 33 04/03/2014 1901   ALT 26 04/03/2014 1901   BILITOT 0.7 04/03/2014 1901        Lab Results  Component Value Date   WBC 12.9 (H) 04/03/2016   HGB 12.8 (L) 04/03/2016   HCT 38.0 (L) 04/03/2016   MCV 87.6 04/03/2016   PLT 206.0 04/03/2016       EOS                        0.3       04/03/2016       Lab Results  Component Value Date   TSH 2.06 04/03/2016     Lab Results  Component Value Date   PROBNP 58.0 04/03/2016             Assessment & Plan:

## 2016-04-04 NOTE — Assessment & Plan Note (Addendum)
ENT eval Mar 15 2014  Dx dev septum / rhinitis rec NS and flonase, no f/u  ACEi stopped 02/18/2015  - Allergy profile 04/03/2016 >  Eos 0.3 /  IgE  Pending - FENO 04/03/2016  =  15  Strongly against allergic asthma    Upper airway cough syndrome (previously labeled PNDS) , is  so named because it's frequently impossible to sort out how much is  CR/sinusitis with freq throat clearing (which can be related to primary GERD)   vs  causing  secondary (" extra esophageal")  GERD from wide swings in gastric pressure that occur with throat clearing, often  promoting self use of mint and menthol lozenges that reduce the lower esophageal sphincter tone and exacerbate the problem further in a cyclical fashion.   These are the same pts (now being labeled as having "irritable larynx syndrome" by some cough centers) who not infrequently have a history of having failed to tolerate ace inhibitors,  dry powder inhalers or biphosphonates or report having atypical/extraesophageal reflux symptoms that don't respond to standard doses of PPI  and are easily confused as having aecopd or asthma flares by even experienced allergists/ pulmonologists (myself included).   For now rec just treat as gerd and then consider repeat sinus eval with sinus ct and trial of 1st gen H1 as per guidelines

## 2016-04-04 NOTE — Assessment & Plan Note (Addendum)
PFT's  03/04/16  FEV1 1.52 (52 % ) ratio 87  p no % improvement from saba p nothing prior to study with DLCO  41 % corrects to 113  % for alv volume    Symptoms are markedly disproportionate to objective findings and not clear this is a lung problem but pt does appear to have difficult airway management issues. DDX of  difficult airways management almost all start with A and  include Adherence, Ace Inhibitors, Acid Reflux, Active Sinus Disease, Alpha 1 Antitripsin deficiency, Anxiety masquerading as Airways dz,  ABPA,  Allergy(esp in young), Aspiration (esp in elderly), Adverse effects of meds,  Active smokers, A bunch of PE's (a small clot burden can't cause this syndrome unless there is already severe underlying pulm or vascular dz with poor reserve) plus two Bs  = Bronchiectasis and Beta blocker use..and one C= CHF  Adherence is always the initial "prime suspect" and is a multilayered concern that requires a "trust but verify" approach in every patient - starting with knowing how to use medications, especially inhalers, correctly, keeping up with refills and understanding the fundamental difference between maintenance and prns vs those medications only taken for a very short course and then stopped and not refilled.   ? Acid (or non-acid) GERD > always difficult to exclude as up to 75% of pts in some series report no assoc GI/ Heartburn symptoms> rec max (24h)  acid suppression and diet restrictions/ reviewed and instructions given in writing.   ? Anxiety/depression/ deconditioning > usually at the bottom of this list of usual suspects but should be much higher on this pt's based on H and P and note already on psychotropics .   ? Allergy > unlikely with FENO so low >> see uacs (see separate a/p)   ? A bunch of PE's unlikely on coumadin  CHF > excluded with bnp so low    He could not get up and walk today for his because he did not bring his crutch and isn't feeling up to it because of his legs.  When he returns am going to ask him to bring whatever equipment he uses that reproduces the complaint of shortness of breath with activity so we can observe what he is talking about objectively.  I had an extended discussion with the patient reviewing all relevant studies completed to date and  lasting 25 minutes of a 40  minute office visit to re-establish/ continue w/u  re refractory non-specific but potentially very serious pulmonary symptoms of unknown etiology.  Each maintenance medication was reviewed in detail including most importantly the difference between maintenance and prns and under what circumstances the prns are to be triggered using an action plan format that is not reflected in the computer generated alphabetically organized AVS.    Please see AVS for specific instructions unique to this office visit that I personally wrote and verbalized to the the pt in detail and then reviewed with pt  by my nurse highlighting any  changes in therapy recommended at today's visit to their plan of care.

## 2016-04-04 NOTE — Assessment & Plan Note (Signed)
Complicated by HBP/ Hyperlipidemia/ DM   Body mass index is 39.05   Lab Results  Component Value Date   TSH 2.06 04/03/2016     Contributing to gerd tendency/ doe/reviewed the need and the process to achieve and maintain neg calorie balance > defer f/u primary care including intermittently monitoring thyroid status

## 2016-04-06 ENCOUNTER — Ambulatory Visit: Payer: Medicare Other | Admitting: Cardiovascular Disease

## 2016-04-06 LAB — RESPIRATORY ALLERGY PROFILE REGION II ~~LOC~~
Allergen, A. alternata, m6: <0.1 kU/L
Allergen, C. Herbarum, M2: <0.1 kU/L
Allergen, D pternoyssinus,d7: <0.1 kU/L
Allergen, Mulberry, t76: <0.1 kU/L
Allergen, Oak,t7: <0.1 kU/L
BERMUDA GRASS: 2.09 kU/L — AB
Box Elder IgE: <0.1 kU/L
Cat Dander: <0.1 kU/L
Cockroach: 0.18 kU/L — ABNORMAL HIGH
D. farinae: <0.1 kU/L
Dog Dander: <0.1 kU/L
Elm IgE: <0.1 kU/L
IGE (IMMUNOGLOBULIN E), SERUM: 468 kU/L — AB (ref <115)
Johnson Grass: 3.64 kU/L — ABNORMAL HIGH
Pecan/Hickory Tree IgE: <0.1 kU/L
Sheep Sorrel IgE: 0.13 kU/L — ABNORMAL HIGH
Timothy Grass: 8.87 kU/L — ABNORMAL HIGH

## 2016-04-06 NOTE — Progress Notes (Signed)
Thank you Jeanette Moffatt 

## 2016-04-06 NOTE — Progress Notes (Signed)
Spoke with pt and notified of results per Dr. Wert. Pt verbalized understanding and denied any questions. 

## 2016-04-07 DIAGNOSIS — L602 Onychogryphosis: Secondary | ICD-10-CM | POA: Diagnosis not present

## 2016-04-07 DIAGNOSIS — E1351 Other specified diabetes mellitus with diabetic peripheral angiopathy without gangrene: Secondary | ICD-10-CM | POA: Diagnosis not present

## 2016-04-07 NOTE — Progress Notes (Signed)
Spoke with pt and notified of results per Dr. Wert. Pt verbalized understanding and denied any questions. 

## 2016-04-08 ENCOUNTER — Ambulatory Visit (INDEPENDENT_AMBULATORY_CARE_PROVIDER_SITE_OTHER): Payer: Medicare Other | Admitting: Pharmacist

## 2016-04-08 DIAGNOSIS — Z7901 Long term (current) use of anticoagulants: Secondary | ICD-10-CM

## 2016-04-08 DIAGNOSIS — Z8672 Personal history of thrombophlebitis: Secondary | ICD-10-CM | POA: Diagnosis not present

## 2016-04-08 LAB — POCT INR: INR: 2.2

## 2016-04-10 ENCOUNTER — Encounter: Payer: Self-pay | Admitting: Family

## 2016-04-17 ENCOUNTER — Ambulatory Visit (INDEPENDENT_AMBULATORY_CARE_PROVIDER_SITE_OTHER): Payer: Medicare Other | Admitting: Family

## 2016-04-17 ENCOUNTER — Ambulatory Visit (HOSPITAL_COMMUNITY)
Admission: RE | Admit: 2016-04-17 | Discharge: 2016-04-17 | Disposition: A | Discharge status: Home / Self Care | Payer: Medicare Other | Source: Ambulatory Visit | Attending: Vascular Surgery | Admitting: Vascular Surgery

## 2016-04-17 ENCOUNTER — Encounter: Payer: Self-pay | Admitting: Family

## 2016-04-17 VITALS — BP 128/75 | HR 104 | Temp 97.7°F | Resp 20 | Ht 71.0 in | Wt 280.0 lb

## 2016-04-17 DIAGNOSIS — R9439 Abnormal result of other cardiovascular function study: Secondary | ICD-10-CM | POA: Diagnosis not present

## 2016-04-17 DIAGNOSIS — E1151 Type 2 diabetes mellitus with diabetic peripheral angiopathy without gangrene: Secondary | ICD-10-CM | POA: Insufficient documentation

## 2016-04-17 DIAGNOSIS — Z89511 Acquired absence of right leg below knee: Secondary | ICD-10-CM

## 2016-04-17 DIAGNOSIS — Z87891 Personal history of nicotine dependence: Secondary | ICD-10-CM | POA: Diagnosis not present

## 2016-04-17 DIAGNOSIS — Z86711 Personal history of pulmonary embolism: Secondary | ICD-10-CM | POA: Diagnosis not present

## 2016-04-17 DIAGNOSIS — I779 Disorder of arteries and arterioles, unspecified: Secondary | ICD-10-CM | POA: Diagnosis not present

## 2016-04-17 NOTE — Progress Notes (Signed)
[facility] [place]   CC: Follow up peripheral artery occlusive disease  History of Present Illness Patrick Shelton is a 76 y.o. male patient that Dr. Bridgett Larsson has been following for PAD, he returns today for follow up. 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 The patient's treatment regimen currently included: maximal medical management.   Pt denies claudication symptoms in LLE,has some left knee pain. Pt denies non-healing wounds.  Pt denies history of stroke or TIA.  He has a history of PE and atrial fib, takes coumadin for this.  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 He has a treadmill at home, is no longer using it.  He states that he tries not to sleep due to bad dreams, states he has PTSD, and has received help from the [employer]. He is not walking much due to left LE gout and arthritis pain.   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  He has stopped driving since mid [id number] due to not feeling the pedal controls at his left foot,  has gout in both hands.   Pt Diabetic: Yes, states last A1C was 9.?, he uses an insulin pump. Pt smoker: former smoker, quit in 1979  Pt meds include:  Statin :Yes ASA: No Other anticoagulants/antiplatelets: coumadin      Past Medical History:  Diagnosis Date  . Arthritis Sept. 2015   Rh. Neck and Upper Back  . Arthritis Sept. 2015   Gout-Right Hand  Left knee  . Cancer (HCC)    8 wks Radiation  . Chronic kidney disease   . Diabetes mellitus   . ED (erectile dysfunction)   . Hx of agent Orange exposure    [place]  [place] during that conflict  . Hyperlipidemia   . Hypertension   . PVD (peripheral vascular disease) ([place])   . PVT (paroxysmal ventricular tachycardia) ([place])   . S/P BKA (below knee amputation) [facility])     Social History Social History  Substance Use Topics  . Smoking status: Former Smoker    Packs/day: 0.25    Years: 10.00    Types: Cigarettes    Quit date: 03/09/1977  . Smokeless tobacco: Never Used  . Alcohol use No    Family History Family History  Problem Relation Age of Onset  . Dementia Mother   . Diabetes Father     Amputation- Bilateral leg  . Peripheral vascular disease Father     Past Surgical History:  Procedure Laterality Date  . AMPUTATION Right 2009   BKA  . PROSTATE SURGERY     8 wks radiation    Allergies  Allergen Reactions  . Prednisone Anaphylaxis and Shortness Of Breath    Current Outpatient Prescriptions  Medication Sig Dispense Refill  . B Complex Vitamins (B COMPLEX PO) Take 1 tablet by mouth daily.     . Cholecalciferol (VITAMIN D-3) 5000 UNITS TABS Take 5,000 Units by mouth daily.     Marland Kitchen ezetimibe (ZETIA) 10 MG tablet Take 5 mg by mouth daily.     . famotidine (PEPCID) 20 MG tablet One at  bedtime 30 tablet 2  . guaiFENesin (MUCINEX) 600 MG 12 hr tablet Take 600 mg by mouth 2 (two) times daily.    . hydrochlorothiazide 25 MG tablet Take 25 mg by mouth daily.      . Ipratropium-Albuterol (COMBIVENT) 20-100 MCG/ACT AERS respimat Inhale 1 puff into the lungs 3 (three) times daily. 4 g 6  . NOVOLOG 100 UNIT/ML injection Inject into the skin continuous. Via insulin pump  1  . pantoprazole (PROTONIX) 40 MG tablet Take 1 tablet (40 mg total) by mouth daily. Take 30-60 min before first meal of the day 30 tablet 2  . pravastatin (PRAVACHOL) 40 MG tablet Take 40 mg by mouth daily.      . valsartan (DIOVAN) 320 MG tablet Take 320 mg by mouth daily.    Marland Kitchen warfarin (COUMADIN) 2.5 MG tablet Take 1/2 to 1 tablet by mouth daily as directed by coumadin  clinic 90 tablet 0   No current facility-administered medications for this visit.     ROS: See HPI for pertinent positives and negatives.   Physical Examination  Vitals:   04/17/16 1540  BP: 128/75  Pulse: (!) 104  Resp: 20  Temp: 97.7 F (36.5 C)  TempSrc: Oral  SpO2: 99%  Weight: 280 lb (127 kg)  Height: 5\' 11"  (1.803 m)   Body mass index is 39.05 kg/m.  General: A&O x 3, WDWN, obese male.  Gait: slow, deliberate, using rolling walker Eyes: Pupils are equal  Pulmonary: Respirations are somewhat labored with walking, limited air movement in all fields, no wheezes, rales, or rhonchi  Cardiac: regular rhythm, no detected murmur   Carotid Bruits Left Right   Negative  Negative   Aorta is not palpable Radial pulses: 1+ palpable bilaterally   VASCULAR EXAM: Extremitieswithout ischemic changes  without Gangrene; without open wounds. Wearing prosthesis on right BKA.  Left lower leg with 1+ non pitting edema, no erythema, no signs of cellulitis, no ulcers. + chronic venous stasis changes.  LE Pulses  LEFT  RIGHT   FEMORAL not palpableseated in w/c, obese not palpable  POPLITEAL  not palpable  not palpable  POSTERIOR TIBIAL  not palpable  BKA   DORSALIS PEDIS ANTERIOR TIBIAL  not palpable BKA    Abdomen: soft, NT, no masses palpated.  Skin: no rashes, no ulcers.  Musculoskeletal: no muscle wasting or atrophy, wearing right BKA prosthesis.  Neurologic: A&O X 3; Appropriate Affect ; SENSATION: normal; MOTOR FUNCTION: moving all extremities equally, motor strength 5/5 in UE's, 4/5 in LE's. Speech is fluent/normal. CN 2-12 grossly intact.    ASSESSMENT: Patrick Shelton is a 76 y.o. 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  He is ambulating with his right BKA prosthesis. He has no ulcers on his right BKA stump, no  ischemic changes in his left LE. He has no claudication in his left LE.  His A1C is remains stable at 9.?, but he know this is still uncontrolled. He has not smoked since 1979.  Left lower leg with 1+ non pitting edema, no erythema, no signs of cellulitis, no ulcers, + chronic venous stasis changes.  He is on coumadin for a history of pulmonary embolism.   DATA Today's ABI: Left LE arteries are non-compressible due to calcification secondary to chronic hyperglycemia, waveforms remain biphasic.  Left TBI is stable at 0.41 from 0.39 on 01-03-16.  No DVT on venous duplex 06/18/15  PLAN:  Based on the patient's vascular studies and examination, pt will return to clinic in 6 months with left ABI. I advised him to notify us if he develops non healing wounds or any concerns re the circulation in his left leg and right BKA stump.  Daily seated leg exercises discussed and demonstrated.   I discussed in depth with the patient the nature of atherosclerosis, and emphasized the importance of maximal medical management including strict control of blood pressure, blood glucose, and lipid levels, obtaining regular exercise, and continued cessation of smoking.  The patient is aware that without maximal medical management the underlying atherosclerotic disease process will progress, limiting the benefit of any interventions.  The patient was given information about PAD including signs, symptoms, treatment, what symptoms should prompt the patient to seek immediate medical care, and risk reduction measures to take.  Clemon Chambers, RN, MSN, FNP-C [facility] Phone: [phone]  Clinic MD: Donzetta Matters on call  04/17/16 3:50 PM

## 2016-04-17 NOTE — Patient Instructions (Signed)

## 2016-04-20 NOTE — Addendum Note (Signed)
Addended by: Lianne Cure A on: 04/20/2016 02:35 PM   Modules accepted: Orders

## 2016-05-05 ENCOUNTER — Ambulatory Visit: Payer: Medicare Other | Admitting: Cardiovascular Disease

## 2016-05-20 ENCOUNTER — Ambulatory Visit (INDEPENDENT_AMBULATORY_CARE_PROVIDER_SITE_OTHER): Payer: Medicare Other | Admitting: Pharmacist Clinician (PhC)/ Clinical Pharmacy Specialist

## 2016-05-20 DIAGNOSIS — Z8672 Personal history of thrombophlebitis: Secondary | ICD-10-CM

## 2016-05-20 DIAGNOSIS — I779 Disorder of arteries and arterioles, unspecified: Secondary | ICD-10-CM

## 2016-05-20 DIAGNOSIS — Z7901 Long term (current) use of anticoagulants: Secondary | ICD-10-CM | POA: Diagnosis not present

## 2016-05-20 LAB — POCT INR: INR: 2.6

## 2016-05-26 DIAGNOSIS — E1165 Type 2 diabetes mellitus with hyperglycemia: Secondary | ICD-10-CM | POA: Diagnosis not present

## 2016-05-26 DIAGNOSIS — E782 Mixed hyperlipidemia: Secondary | ICD-10-CM | POA: Diagnosis not present

## 2016-05-26 DIAGNOSIS — Z794 Long term (current) use of insulin: Secondary | ICD-10-CM | POA: Diagnosis not present

## 2016-05-26 DIAGNOSIS — E559 Vitamin D deficiency, unspecified: Secondary | ICD-10-CM | POA: Diagnosis not present

## 2016-05-26 DIAGNOSIS — E790 Hyperuricemia without signs of inflammatory arthritis and tophaceous disease: Secondary | ICD-10-CM | POA: Diagnosis not present

## 2016-06-01 ENCOUNTER — Telehealth (INDEPENDENT_AMBULATORY_CARE_PROVIDER_SITE_OTHER): Payer: Self-pay | Admitting: Orthopedic Surgery

## 2016-06-01 NOTE — Telephone Encounter (Signed)
Patient called requesting a new sock/sleeve for his leg so that he can put his prosthetic leg on. He states that he has been trying for a month to get it. CB#[phone]

## 2016-06-02 ENCOUNTER — Other Ambulatory Visit (INDEPENDENT_AMBULATORY_CARE_PROVIDER_SITE_OTHER): Payer: Self-pay

## 2016-06-02 NOTE — Telephone Encounter (Signed)
Order faxed to [facility]. Advised that the pt had not been in the office since 2015 and that if an appt  Is needed to  Have the pt call the office.

## 2016-06-23 DIAGNOSIS — Z89511 Acquired absence of right leg below knee: Secondary | ICD-10-CM | POA: Diagnosis not present

## 2016-06-23 DIAGNOSIS — E559 Vitamin D deficiency, unspecified: Secondary | ICD-10-CM | POA: Insufficient documentation

## 2016-06-23 DIAGNOSIS — Z794 Long term (current) use of insulin: Secondary | ICD-10-CM | POA: Diagnosis not present

## 2016-06-23 DIAGNOSIS — Z9641 Presence of insulin pump (external) (internal): Secondary | ICD-10-CM | POA: Insufficient documentation

## 2016-06-23 DIAGNOSIS — E782 Mixed hyperlipidemia: Secondary | ICD-10-CM | POA: Diagnosis not present

## 2016-06-23 DIAGNOSIS — I129 Hypertensive chronic kidney disease with stage 1 through stage 4 chronic kidney disease, or unspecified chronic kidney disease: Secondary | ICD-10-CM | POA: Diagnosis not present

## 2016-06-23 DIAGNOSIS — E1142 Type 2 diabetes mellitus with diabetic polyneuropathy: Secondary | ICD-10-CM | POA: Insufficient documentation

## 2016-06-23 DIAGNOSIS — N183 Chronic kidney disease, stage 3 (moderate): Secondary | ICD-10-CM | POA: Diagnosis not present

## 2016-06-23 DIAGNOSIS — E790 Hyperuricemia without signs of inflammatory arthritis and tophaceous disease: Secondary | ICD-10-CM | POA: Diagnosis not present

## 2016-06-23 DIAGNOSIS — E1165 Type 2 diabetes mellitus with hyperglycemia: Secondary | ICD-10-CM | POA: Diagnosis not present

## 2016-07-02 ENCOUNTER — Ambulatory Visit (INDEPENDENT_AMBULATORY_CARE_PROVIDER_SITE_OTHER): Payer: Medicare Other | Admitting: Pharmacist Clinician (PhC)/ Clinical Pharmacy Specialist

## 2016-07-02 DIAGNOSIS — I779 Disorder of arteries and arterioles, unspecified: Secondary | ICD-10-CM

## 2016-07-02 DIAGNOSIS — Z8672 Personal history of thrombophlebitis: Secondary | ICD-10-CM

## 2016-07-02 DIAGNOSIS — Z7901 Long term (current) use of anticoagulants: Secondary | ICD-10-CM | POA: Diagnosis not present

## 2016-07-02 LAB — POCT INR: INR: 2.6

## 2016-07-16 DIAGNOSIS — E1351 Other specified diabetes mellitus with diabetic peripheral angiopathy without gangrene: Secondary | ICD-10-CM | POA: Diagnosis not present

## 2016-07-16 DIAGNOSIS — M79672 Pain in left foot: Secondary | ICD-10-CM | POA: Diagnosis not present

## 2016-07-16 DIAGNOSIS — B351 Tinea unguium: Secondary | ICD-10-CM | POA: Diagnosis not present

## 2016-07-23 DIAGNOSIS — Z961 Presence of intraocular lens: Secondary | ICD-10-CM | POA: Diagnosis not present

## 2016-07-23 DIAGNOSIS — H26493 Other secondary cataract, bilateral: Secondary | ICD-10-CM | POA: Diagnosis not present

## 2016-07-23 DIAGNOSIS — E113593 Type 2 diabetes mellitus with proliferative diabetic retinopathy without macular edema, bilateral: Secondary | ICD-10-CM | POA: Diagnosis not present

## 2016-07-23 DIAGNOSIS — H26491 Other secondary cataract, right eye: Secondary | ICD-10-CM | POA: Diagnosis not present

## 2016-07-31 ENCOUNTER — Other Ambulatory Visit: Payer: Self-pay | Admitting: Cardiovascular Disease

## 2016-08-13 ENCOUNTER — Ambulatory Visit (INDEPENDENT_AMBULATORY_CARE_PROVIDER_SITE_OTHER): Payer: Medicare Other | Admitting: Pharmacist

## 2016-08-13 DIAGNOSIS — Z8672 Personal history of thrombophlebitis: Secondary | ICD-10-CM | POA: Diagnosis not present

## 2016-08-13 DIAGNOSIS — I779 Disorder of arteries and arterioles, unspecified: Secondary | ICD-10-CM

## 2016-08-13 DIAGNOSIS — Z7901 Long term (current) use of anticoagulants: Secondary | ICD-10-CM | POA: Diagnosis not present

## 2016-08-13 LAB — POCT INR: INR: 3

## 2016-09-10 DIAGNOSIS — N139 Obstructive and reflux uropathy, unspecified: Secondary | ICD-10-CM | POA: Diagnosis not present

## 2016-09-10 DIAGNOSIS — C61 Malignant neoplasm of prostate: Secondary | ICD-10-CM | POA: Diagnosis not present

## 2016-09-10 DIAGNOSIS — N401 Enlarged prostate with lower urinary tract symptoms: Secondary | ICD-10-CM | POA: Diagnosis not present

## 2016-09-24 ENCOUNTER — Ambulatory Visit (INDEPENDENT_AMBULATORY_CARE_PROVIDER_SITE_OTHER): Payer: Medicare Other | Admitting: Pharmacist Clinician (PhC)/ Clinical Pharmacy Specialist

## 2016-09-24 DIAGNOSIS — Z8672 Personal history of thrombophlebitis: Secondary | ICD-10-CM | POA: Diagnosis not present

## 2016-09-24 DIAGNOSIS — I779 Disorder of arteries and arterioles, unspecified: Secondary | ICD-10-CM | POA: Diagnosis not present

## 2016-09-24 DIAGNOSIS — Z7901 Long term (current) use of anticoagulants: Secondary | ICD-10-CM | POA: Diagnosis not present

## 2016-09-24 LAB — POCT INR: INR: 2.6

## 2016-10-01 DIAGNOSIS — E1351 Other specified diabetes mellitus with diabetic peripheral angiopathy without gangrene: Secondary | ICD-10-CM | POA: Diagnosis not present

## 2016-10-01 DIAGNOSIS — L602 Onychogryphosis: Secondary | ICD-10-CM | POA: Diagnosis not present

## 2016-10-28 ENCOUNTER — Encounter (HOSPITAL_COMMUNITY): Payer: Medicare Other

## 2016-10-28 ENCOUNTER — Ambulatory Visit: Payer: Medicare Other | Admitting: Family

## 2016-11-04 ENCOUNTER — Ambulatory Visit: Payer: Medicare Other | Admitting: Family

## 2016-11-05 ENCOUNTER — Ambulatory Visit (INDEPENDENT_AMBULATORY_CARE_PROVIDER_SITE_OTHER): Payer: Medicare Other | Admitting: Pharmacist Clinician (PhC)/ Clinical Pharmacy Specialist

## 2016-11-05 DIAGNOSIS — I779 Disorder of arteries and arterioles, unspecified: Secondary | ICD-10-CM | POA: Diagnosis not present

## 2016-11-05 DIAGNOSIS — Z8672 Personal history of thrombophlebitis: Secondary | ICD-10-CM | POA: Diagnosis not present

## 2016-11-05 DIAGNOSIS — Z7901 Long term (current) use of anticoagulants: Secondary | ICD-10-CM

## 2016-11-05 LAB — POCT INR: INR: 2.5

## 2016-11-06 DIAGNOSIS — E1165 Type 2 diabetes mellitus with hyperglycemia: Secondary | ICD-10-CM | POA: Diagnosis not present

## 2016-11-06 DIAGNOSIS — E782 Mixed hyperlipidemia: Secondary | ICD-10-CM | POA: Diagnosis not present

## 2016-11-06 DIAGNOSIS — E559 Vitamin D deficiency, unspecified: Secondary | ICD-10-CM | POA: Diagnosis not present

## 2016-11-06 DIAGNOSIS — Z794 Long term (current) use of insulin: Secondary | ICD-10-CM | POA: Diagnosis not present

## 2016-11-06 DIAGNOSIS — E790 Hyperuricemia without signs of inflammatory arthritis and tophaceous disease: Secondary | ICD-10-CM | POA: Diagnosis not present

## 2016-11-11 DIAGNOSIS — Z794 Long term (current) use of insulin: Secondary | ICD-10-CM | POA: Diagnosis not present

## 2016-11-11 DIAGNOSIS — E790 Hyperuricemia without signs of inflammatory arthritis and tophaceous disease: Secondary | ICD-10-CM | POA: Diagnosis not present

## 2016-11-11 DIAGNOSIS — E782 Mixed hyperlipidemia: Secondary | ICD-10-CM | POA: Diagnosis not present

## 2016-11-11 DIAGNOSIS — E1142 Type 2 diabetes mellitus with diabetic polyneuropathy: Secondary | ICD-10-CM | POA: Diagnosis not present

## 2016-11-11 DIAGNOSIS — E559 Vitamin D deficiency, unspecified: Secondary | ICD-10-CM | POA: Diagnosis not present

## 2016-11-11 DIAGNOSIS — I129 Hypertensive chronic kidney disease with stage 1 through stage 4 chronic kidney disease, or unspecified chronic kidney disease: Secondary | ICD-10-CM | POA: Diagnosis not present

## 2016-11-11 DIAGNOSIS — Z89511 Acquired absence of right leg below knee: Secondary | ICD-10-CM | POA: Diagnosis not present

## 2016-11-11 DIAGNOSIS — N183 Chronic kidney disease, stage 3 (moderate): Secondary | ICD-10-CM | POA: Diagnosis not present

## 2016-11-11 DIAGNOSIS — Z9641 Presence of insulin pump (external) (internal): Secondary | ICD-10-CM | POA: Diagnosis not present

## 2016-11-11 DIAGNOSIS — E1165 Type 2 diabetes mellitus with hyperglycemia: Secondary | ICD-10-CM | POA: Diagnosis not present

## 2016-11-11 DIAGNOSIS — E669 Obesity, unspecified: Secondary | ICD-10-CM | POA: Diagnosis not present

## 2016-11-11 DIAGNOSIS — Z6836 Body mass index (BMI) 36.0-36.9, adult: Secondary | ICD-10-CM | POA: Diagnosis not present

## 2016-11-17 ENCOUNTER — Encounter: Payer: Self-pay | Admitting: Family

## 2016-11-19 ENCOUNTER — Ambulatory Visit: Payer: Medicare Other | Admitting: Family

## 2016-11-19 ENCOUNTER — Encounter (HOSPITAL_COMMUNITY): Payer: Medicare Other

## 2016-12-11 DIAGNOSIS — E1351 Other specified diabetes mellitus with diabetic peripheral angiopathy without gangrene: Secondary | ICD-10-CM | POA: Diagnosis not present

## 2016-12-11 DIAGNOSIS — L602 Onychogryphosis: Secondary | ICD-10-CM | POA: Diagnosis not present

## 2016-12-15 ENCOUNTER — Ambulatory Visit (INDEPENDENT_AMBULATORY_CARE_PROVIDER_SITE_OTHER): Payer: Medicare Other | Admitting: Pharmacist Clinician (PhC)/ Clinical Pharmacy Specialist

## 2016-12-15 DIAGNOSIS — Z7901 Long term (current) use of anticoagulants: Secondary | ICD-10-CM

## 2016-12-15 DIAGNOSIS — Z86711 Personal history of pulmonary embolism: Secondary | ICD-10-CM

## 2016-12-15 DIAGNOSIS — Z8672 Personal history of thrombophlebitis: Secondary | ICD-10-CM | POA: Diagnosis not present

## 2016-12-15 LAB — POCT INR: INR: 1.9

## 2017-01-08 ENCOUNTER — Telehealth: Payer: Self-pay | Admitting: Pharmacist Clinician (PhC)/ Clinical Pharmacy Specialist

## 2017-01-08 NOTE — Telephone Encounter (Signed)
Patient called and LM concerned about new prescription for allopurinol.  Isn't sure safe with other meds.  Returned call, LMOM that allopurinol is safe with warfarin, we rarely see any INR changes needed.

## 2017-01-21 ENCOUNTER — Encounter (HOSPITAL_COMMUNITY): Payer: Medicare Other

## 2017-01-21 ENCOUNTER — Ambulatory Visit: Payer: Medicare Other | Admitting: Family

## 2017-02-01 ENCOUNTER — Ambulatory Visit (INDEPENDENT_AMBULATORY_CARE_PROVIDER_SITE_OTHER): Payer: Medicare Other | Admitting: Pharmacist

## 2017-02-01 DIAGNOSIS — Z7901 Long term (current) use of anticoagulants: Secondary | ICD-10-CM | POA: Diagnosis not present

## 2017-02-01 DIAGNOSIS — Z8672 Personal history of thrombophlebitis: Secondary | ICD-10-CM

## 2017-02-01 LAB — POCT INR: INR: 1.8

## 2017-02-08 DIAGNOSIS — Z794 Long term (current) use of insulin: Secondary | ICD-10-CM | POA: Diagnosis not present

## 2017-02-08 DIAGNOSIS — E1165 Type 2 diabetes mellitus with hyperglycemia: Secondary | ICD-10-CM | POA: Diagnosis not present

## 2017-02-08 DIAGNOSIS — E782 Mixed hyperlipidemia: Secondary | ICD-10-CM | POA: Diagnosis not present

## 2017-02-12 DIAGNOSIS — Z794 Long term (current) use of insulin: Secondary | ICD-10-CM | POA: Diagnosis not present

## 2017-02-12 DIAGNOSIS — E1142 Type 2 diabetes mellitus with diabetic polyneuropathy: Secondary | ICD-10-CM | POA: Diagnosis not present

## 2017-02-12 DIAGNOSIS — E790 Hyperuricemia without signs of inflammatory arthritis and tophaceous disease: Secondary | ICD-10-CM | POA: Diagnosis not present

## 2017-02-12 DIAGNOSIS — E1165 Type 2 diabetes mellitus with hyperglycemia: Secondary | ICD-10-CM | POA: Diagnosis not present

## 2017-02-12 DIAGNOSIS — E559 Vitamin D deficiency, unspecified: Secondary | ICD-10-CM | POA: Diagnosis not present

## 2017-02-12 DIAGNOSIS — Z89511 Acquired absence of right leg below knee: Secondary | ICD-10-CM | POA: Diagnosis not present

## 2017-02-12 DIAGNOSIS — Z9641 Presence of insulin pump (external) (internal): Secondary | ICD-10-CM | POA: Diagnosis not present

## 2017-02-12 DIAGNOSIS — E782 Mixed hyperlipidemia: Secondary | ICD-10-CM | POA: Diagnosis not present

## 2017-02-12 DIAGNOSIS — N183 Chronic kidney disease, stage 3 (moderate): Secondary | ICD-10-CM | POA: Diagnosis not present

## 2017-02-12 DIAGNOSIS — I129 Hypertensive chronic kidney disease with stage 1 through stage 4 chronic kidney disease, or unspecified chronic kidney disease: Secondary | ICD-10-CM | POA: Diagnosis not present

## 2017-03-11 ENCOUNTER — Ambulatory Visit (INDEPENDENT_AMBULATORY_CARE_PROVIDER_SITE_OTHER): Payer: Medicare Other | Admitting: Pharmacist

## 2017-03-11 DIAGNOSIS — Z7901 Long term (current) use of anticoagulants: Secondary | ICD-10-CM

## 2017-03-11 DIAGNOSIS — Z8672 Personal history of thrombophlebitis: Secondary | ICD-10-CM | POA: Diagnosis not present

## 2017-03-11 LAB — POCT INR: INR: 2

## 2017-03-11 MED ORDER — WARFARIN SODIUM 2.5 MG PO TABS: ORAL_TABLET | ORAL | 1 refills | Status: DC

## 2017-03-12 DIAGNOSIS — L602 Onychogryphosis: Secondary | ICD-10-CM | POA: Diagnosis not present

## 2017-03-12 DIAGNOSIS — E1351 Other specified diabetes mellitus with diabetic peripheral angiopathy without gangrene: Secondary | ICD-10-CM | POA: Diagnosis not present

## 2017-03-16 ENCOUNTER — Other Ambulatory Visit: Payer: Self-pay | Admitting: Pharmacist

## 2017-03-16 MED ORDER — WARFARIN SODIUM 2.5 MG PO TABS: ORAL_TABLET | ORAL | 1 refills | Status: DC

## 2017-03-23 ENCOUNTER — Ambulatory Visit (HOSPITAL_COMMUNITY)
Admission: RE | Admit: 2017-03-23 | Discharge: 2017-03-23 | Disposition: A | Discharge status: Home / Self Care | Payer: Medicare Other | Source: Ambulatory Visit | Attending: Family | Admitting: Family

## 2017-03-23 ENCOUNTER — Encounter: Payer: Self-pay | Admitting: Family

## 2017-03-23 ENCOUNTER — Ambulatory Visit (INDEPENDENT_AMBULATORY_CARE_PROVIDER_SITE_OTHER): Payer: Medicare Other | Admitting: Family

## 2017-03-23 VITALS — BP 132/80 | HR 86 | Temp 97.1°F | Resp 20 | Ht 71.0 in | Wt 265.4 lb

## 2017-03-23 DIAGNOSIS — I779 Disorder of arteries and arterioles, unspecified: Secondary | ICD-10-CM | POA: Diagnosis not present

## 2017-03-23 DIAGNOSIS — Z87891 Personal history of nicotine dependence: Secondary | ICD-10-CM | POA: Diagnosis not present

## 2017-03-23 DIAGNOSIS — Z89511 Acquired absence of right leg below knee: Secondary | ICD-10-CM | POA: Diagnosis not present

## 2017-03-23 DIAGNOSIS — E1151 Type 2 diabetes mellitus with diabetic peripheral angiopathy without gangrene: Secondary | ICD-10-CM | POA: Diagnosis not present

## 2017-03-23 DIAGNOSIS — Z86711 Personal history of pulmonary embolism: Secondary | ICD-10-CM | POA: Diagnosis not present

## 2017-03-23 NOTE — Patient Instructions (Signed)

## 2017-03-23 NOTE — Progress Notes (Signed)
[facility] [place]   CC: Follow up peripheral artery occlusive disease  History of Present Illness Patrick Shelton is a 77 y.o. male whom Dr. Bridgett Larsson has been following for PAD, returns today for follow up. 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 The patient's treatment regimen currently included: maximal medical management.   He has gained weight, his walking is more difficult due to left knee pain and instability.  He is walking less, and likely does not walk enough to elicit claudication symptoms.  He is not walking much due to left LE gout, arthritis pain, and left foot worsening neuropathy sx's.   Pt denies non-healing wounds.  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   Pt denies history of stroke or TIA.   He has had dyspnea with walking, states his mucous is very thick, takes low strength guaifenesin which helps.  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  He has PTSD, and has received help from the [employer].   He has stopped driving since mid 4403 due to not feeling the pedal controls athis left foot, has gout in both hands.   Pt Diabetic: Yes, pt states last A1C was 9.4, he uses an insulin pump. He sees an endocrinologist, Dr.Altheimer.  Pt smoker: former smoker, quit in 1979  Pt meds include:  Statin :Yes ASA: No Other anticoagulants/antiplatelets: coumadin; he has a history of PE and atrial fib    Past Medical History:  Diagnosis Date  . Arthritis Sept. 2015   Rh. Neck and Upper Back  . Arthritis Sept. 2015   Gout-Right Hand  Left knee  . Cancer (HCC)    8 wks Radiation  . Chronic kidney disease   . Diabetes mellitus   . ED (erectile dysfunction)   . Hx of  agent Orange exposure    while [place] in [place] during that conflict  . Hyperlipidemia   . Hypertension   . PVD (peripheral vascular disease) ([place])   . PVT (paroxysmal ventricular tachycardia) ([place])   . S/P BKA (below knee amputation) ([place])     Social History Social History   Tobacco Use  . Smoking status: Former Smoker    Packs/day: 0.25    Years: 10.00    Pack years: 2.50    Types: Cigarettes    Last attempt to quit: 03/09/1977    Years since quitting: 40.0  . Smokeless tobacco: Never Used  Substance Use Topics  . Alcohol use: No    Alcohol/week: 0.0 oz  . Drug use: No    Family History Family History  Problem Relation Age of Onset  . Dementia Mother   . Diabetes Father        Amputation- Bilateral leg  . Peripheral vascular disease Father     Past Surgical History:  Procedure Laterality Date  . AMPUTATION Right 2009   BKA  . PROSTATE SURGERY     8 wks radiation    Allergies  Allergen Reactions  . Prednisone Anaphylaxis and Shortness Of Breath    Current Outpatient Medications  Medication Sig Dispense Refill  . B Complex Vitamins (B COMPLEX PO) Take 1 tablet by mouth daily.     . Cholecalciferol (VITAMIN D-3) 5000  UNITS TABS Take 5,000 Units by mouth daily.     Marland Kitchen ezetimibe (ZETIA) 10 MG tablet Take 5 mg by mouth daily.     . famotidine (PEPCID) 20 MG tablet One at bedtime 30 tablet 2  . guaiFENesin (MUCINEX) 600 MG 12 hr tablet Take 600 mg by mouth 2 (two) times daily.    . hydrochlorothiazide 25 MG tablet Take 25 mg by mouth daily.      . Ipratropium-Albuterol (COMBIVENT) 20-100 MCG/ACT AERS respimat Inhale 1 puff into the lungs 3 (three) times daily. 4 g 6  . NOVOLOG 100 UNIT/ML injection Inject into the skin continuous. Via insulin pump  1  . pantoprazole (PROTONIX) 40 MG tablet Take 1 tablet (40 mg total) by mouth daily. Take 30-60 min before first meal of the day 30 tablet 2  . pravastatin (PRAVACHOL) 40 MG tablet Take 40 mg by mouth daily.      .  valsartan (DIOVAN) 320 MG tablet Take 320 mg by mouth daily.    Marland Kitchen warfarin (COUMADIN) 2.5 MG tablet TAKE ONE-HALF (1/2) TO ONE TABLET DAILY AS DIRECTED BY COUMADIN CLINIC 90 tablet 1   No current facility-administered medications for this visit.     ROS: See HPI for pertinent positives and negatives.   Physical Examination  Vitals:   03/23/17 1503  BP: 132/80  Pulse: 86  Resp: 20  Temp: (!) 97.1 F (36.2 C)  TempSrc: Oral  SpO2: 95%  Weight: 265 lb 6.4 oz (120.4 kg)  Height: _0  (1.803 m)   Body mass index is 37.02 kg/m.  General: A&O x 3, WDWN, obese male.  Gait: seated in wheelchair HENT: No gross abnormalities Eyes: PERRLA Pulmonary: Respirations are non labored at rest, limited air movement in all fields, no wheezes, rales,or rhonchi  Cardiac: regular rhythm and rate, no detectedmurmur   Carotid Bruits Left Right   Negative  Negative    Abdominal aortic pulse is not palpable Radial pulses: 1+ palpable bilaterally   VASCULAR EXAM: Extremitieswithout ischemic changes  without Gangrene; without open wounds. Wearing prosthesis on right BKA.  Left lower leg with 1+ non pitting edema, no erythema, no signs of cellulitis, no ulcers. + mild chronic venous stasis changes.  LE Pulses  LEFT  RIGHT   FEMORAL not palpableseated in w/c, obese not palpable  POPLITEAL  not palpable  not palpable  POSTERIOR TIBIAL  not palpable  BKA   DORSALIS PEDIS ANTERIOR TIBIAL  not palpable BKA    Abdomen: soft, NT, no masses palpated. Large panus.  Skin: no rashes, see Extremities.  Musculoskeletal: no muscle wasting or atrophy, wearing right BKA prosthesis.  Neurologic: A&O X 3; Appropriate Affect ; SENSATION: normal; MOTOR FUNCTION: moving all extremities equally, motor strength 5/5 in UE's, 4/5 in LE's. Speech is fluent/normal. CN 2-12 grossly intact. Psychiatric: Thought content is normal, mood appropriate for  clinical situation.      ASSESSMENT: Patrick Shelton is a 77 y.o. 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  He is ambulating less with his right BKA prosthesis since his left knee feels more unstable and is painful, left foot has worsening neuropathy. He is getting around more in his w/c.  He has no ulcers on his right BKA stump, no ischemic changes in his left LE. He has no claudication in his left LE.  His A1C is remains stable at 9.0, but he know this is  still uncontrolled. He has not smoked since 1979.  Left lower leg with 1+ non pitting edema, no erythema, no signs of cellulitis, no ulcers, + mild chronic venous stasis changes.  His atherosclerotic risk factors include uncontrolled DM, former smoker, stage 3 CKD (eGFR was 46 in March 2018), obesity, hypertension, exposure to agent orange, and dyslipidemia.    He is on coumadin for a history of pulmonary embolism.   DATA  ABI (Date: 03/23/2017):  R: BKA   L:   ABI: [place] (was [place] on 04-17-16),   PT: bi  DP: bi  TBI: 0.41 (was 0.41)  Left LE arteries remain non-compressible due to calcification secondary to chronic hyperglycemia, stage 3 CKD, waveforms remain biphasic.  Left TBI is stable at 0.41.  No DVT on venous duplex 06/18/15   PLAN:  Based on the patient's vascular studies and examination, pt will return to clinic in 6 monthswith left ABI. I advised him to notify us if he develops non healing wounds or any concerns re the circulation in his left leg and right BKA stump.  Daily seated leg exercises discussed and demonstrated.     I discussed in depth with the patient the nature of atherosclerosis, and emphasized the importance of maximal medical management including strict control of blood pressure, blood glucose, and lipid levels, obtaining regular exercise, and continued cessation of smoking.  The patient is aware that  without maximal medical management the underlying atherosclerotic disease process will progress, limiting the benefit of any interventions.  The patient was given information about PAD including signs, symptoms, treatment, what symptoms should prompt the patient to seek immediate medical care, and risk reduction measures to take.  Clemon Chambers, RN, MSN, FNP-C [facility] Phone: [phone]  Clinic MD: Early  03/23/17 3:10 PM

## 2017-03-30 NOTE — Addendum Note (Signed)
Addended by: Lianne Cure A on: 03/30/2017 09:48 AM   Modules accepted: Orders

## 2017-04-22 ENCOUNTER — Ambulatory Visit (INDEPENDENT_AMBULATORY_CARE_PROVIDER_SITE_OTHER): Payer: Medicare Other | Admitting: Pharmacist

## 2017-04-22 DIAGNOSIS — Z7901 Long term (current) use of anticoagulants: Secondary | ICD-10-CM

## 2017-04-22 DIAGNOSIS — Z8672 Personal history of thrombophlebitis: Secondary | ICD-10-CM | POA: Diagnosis not present

## 2017-04-22 LAB — POCT INR: INR: 2

## 2017-05-10 DIAGNOSIS — E782 Mixed hyperlipidemia: Secondary | ICD-10-CM | POA: Diagnosis not present

## 2017-05-10 DIAGNOSIS — E1165 Type 2 diabetes mellitus with hyperglycemia: Secondary | ICD-10-CM | POA: Diagnosis not present

## 2017-05-10 DIAGNOSIS — E790 Hyperuricemia without signs of inflammatory arthritis and tophaceous disease: Secondary | ICD-10-CM | POA: Diagnosis not present

## 2017-05-10 DIAGNOSIS — Z794 Long term (current) use of insulin: Secondary | ICD-10-CM | POA: Diagnosis not present

## 2017-05-13 DIAGNOSIS — E559 Vitamin D deficiency, unspecified: Secondary | ICD-10-CM | POA: Diagnosis not present

## 2017-05-13 DIAGNOSIS — E790 Hyperuricemia without signs of inflammatory arthritis and tophaceous disease: Secondary | ICD-10-CM | POA: Diagnosis not present

## 2017-05-13 DIAGNOSIS — E782 Mixed hyperlipidemia: Secondary | ICD-10-CM | POA: Diagnosis not present

## 2017-05-13 DIAGNOSIS — Z794 Long term (current) use of insulin: Secondary | ICD-10-CM | POA: Diagnosis not present

## 2017-05-13 DIAGNOSIS — E1165 Type 2 diabetes mellitus with hyperglycemia: Secondary | ICD-10-CM | POA: Diagnosis not present

## 2017-05-13 DIAGNOSIS — Z89511 Acquired absence of right leg below knee: Secondary | ICD-10-CM | POA: Diagnosis not present

## 2017-05-13 DIAGNOSIS — N183 Chronic kidney disease, stage 3 (moderate): Secondary | ICD-10-CM | POA: Diagnosis not present

## 2017-05-13 DIAGNOSIS — I129 Hypertensive chronic kidney disease with stage 1 through stage 4 chronic kidney disease, or unspecified chronic kidney disease: Secondary | ICD-10-CM | POA: Diagnosis not present

## 2017-05-13 DIAGNOSIS — E1142 Type 2 diabetes mellitus with diabetic polyneuropathy: Secondary | ICD-10-CM | POA: Diagnosis not present

## 2017-05-13 DIAGNOSIS — Z9641 Presence of insulin pump (external) (internal): Secondary | ICD-10-CM | POA: Diagnosis not present

## 2017-05-31 DIAGNOSIS — L602 Onychogryphosis: Secondary | ICD-10-CM | POA: Diagnosis not present

## 2017-05-31 DIAGNOSIS — E1351 Other specified diabetes mellitus with diabetic peripheral angiopathy without gangrene: Secondary | ICD-10-CM | POA: Diagnosis not present

## 2017-06-03 ENCOUNTER — Ambulatory Visit (INDEPENDENT_AMBULATORY_CARE_PROVIDER_SITE_OTHER): Payer: Medicare Other | Admitting: Pharmacist Clinician (PhC)/ Clinical Pharmacy Specialist

## 2017-06-03 DIAGNOSIS — Z8672 Personal history of thrombophlebitis: Secondary | ICD-10-CM

## 2017-06-03 DIAGNOSIS — Z86711 Personal history of pulmonary embolism: Secondary | ICD-10-CM | POA: Diagnosis not present

## 2017-06-03 DIAGNOSIS — Z7901 Long term (current) use of anticoagulants: Secondary | ICD-10-CM

## 2017-06-03 LAB — POCT INR: INR: 2

## 2017-06-30 DIAGNOSIS — E1351 Other specified diabetes mellitus with diabetic peripheral angiopathy without gangrene: Secondary | ICD-10-CM | POA: Diagnosis not present

## 2017-06-30 DIAGNOSIS — R238 Other skin changes: Secondary | ICD-10-CM | POA: Diagnosis not present

## 2017-07-07 DIAGNOSIS — B353 Tinea pedis: Secondary | ICD-10-CM | POA: Diagnosis not present

## 2017-07-07 DIAGNOSIS — R238 Other skin changes: Secondary | ICD-10-CM | POA: Diagnosis not present

## 2017-07-22 ENCOUNTER — Ambulatory Visit (INDEPENDENT_AMBULATORY_CARE_PROVIDER_SITE_OTHER): Payer: Medicare Other | Admitting: Pharmacist Clinician (PhC)/ Clinical Pharmacy Specialist

## 2017-07-22 DIAGNOSIS — Z8672 Personal history of thrombophlebitis: Secondary | ICD-10-CM

## 2017-07-22 DIAGNOSIS — Z7901 Long term (current) use of anticoagulants: Secondary | ICD-10-CM

## 2017-07-22 LAB — POCT INR: INR: 2.4

## 2017-07-22 NOTE — Patient Instructions (Signed)
Description   Continue with 1 tablet daily except 1/2 tablet each Monday, Wednesday and Friday. Repeat INR in 6 weeks.      

## 2017-08-19 DIAGNOSIS — Z794 Long term (current) use of insulin: Secondary | ICD-10-CM | POA: Diagnosis not present

## 2017-08-19 DIAGNOSIS — H26491 Other secondary cataract, right eye: Secondary | ICD-10-CM | POA: Diagnosis not present

## 2017-08-19 DIAGNOSIS — H43813 Vitreous degeneration, bilateral: Secondary | ICD-10-CM | POA: Diagnosis not present

## 2017-08-19 DIAGNOSIS — E113593 Type 2 diabetes mellitus with proliferative diabetic retinopathy without macular edema, bilateral: Secondary | ICD-10-CM | POA: Diagnosis not present

## 2017-08-19 DIAGNOSIS — Z961 Presence of intraocular lens: Secondary | ICD-10-CM | POA: Diagnosis not present

## 2017-08-23 ENCOUNTER — Encounter: Payer: Self-pay | Admitting: Cardiovascular Disease

## 2017-08-23 DIAGNOSIS — E782 Mixed hyperlipidemia: Secondary | ICD-10-CM | POA: Diagnosis not present

## 2017-08-23 DIAGNOSIS — E1165 Type 2 diabetes mellitus with hyperglycemia: Secondary | ICD-10-CM | POA: Diagnosis not present

## 2017-08-23 DIAGNOSIS — E790 Hyperuricemia without signs of inflammatory arthritis and tophaceous disease: Secondary | ICD-10-CM | POA: Diagnosis not present

## 2017-08-23 DIAGNOSIS — Z794 Long term (current) use of insulin: Secondary | ICD-10-CM | POA: Diagnosis not present

## 2017-08-24 DIAGNOSIS — L602 Onychogryphosis: Secondary | ICD-10-CM | POA: Diagnosis not present

## 2017-08-24 DIAGNOSIS — E1351 Other specified diabetes mellitus with diabetic peripheral angiopathy without gangrene: Secondary | ICD-10-CM | POA: Diagnosis not present

## 2017-08-30 ENCOUNTER — Encounter: Payer: Self-pay | Admitting: Cardiovascular Disease

## 2017-08-30 DIAGNOSIS — Z6837 Body mass index (BMI) 37.0-37.9, adult: Secondary | ICD-10-CM | POA: Diagnosis not present

## 2017-08-30 DIAGNOSIS — N183 Chronic kidney disease, stage 3 (moderate): Secondary | ICD-10-CM | POA: Diagnosis not present

## 2017-08-30 DIAGNOSIS — E559 Vitamin D deficiency, unspecified: Secondary | ICD-10-CM | POA: Diagnosis not present

## 2017-08-30 DIAGNOSIS — Z89511 Acquired absence of right leg below knee: Secondary | ICD-10-CM | POA: Diagnosis not present

## 2017-08-30 DIAGNOSIS — E1165 Type 2 diabetes mellitus with hyperglycemia: Secondary | ICD-10-CM | POA: Diagnosis not present

## 2017-08-30 DIAGNOSIS — E782 Mixed hyperlipidemia: Secondary | ICD-10-CM | POA: Diagnosis not present

## 2017-08-30 DIAGNOSIS — Z794 Long term (current) use of insulin: Secondary | ICD-10-CM | POA: Diagnosis not present

## 2017-08-30 DIAGNOSIS — E1142 Type 2 diabetes mellitus with diabetic polyneuropathy: Secondary | ICD-10-CM | POA: Diagnosis not present

## 2017-08-30 DIAGNOSIS — E669 Obesity, unspecified: Secondary | ICD-10-CM | POA: Diagnosis not present

## 2017-08-30 DIAGNOSIS — Z9641 Presence of insulin pump (external) (internal): Secondary | ICD-10-CM | POA: Diagnosis not present

## 2017-08-30 DIAGNOSIS — E790 Hyperuricemia without signs of inflammatory arthritis and tophaceous disease: Secondary | ICD-10-CM | POA: Diagnosis not present

## 2017-08-30 DIAGNOSIS — I129 Hypertensive chronic kidney disease with stage 1 through stage 4 chronic kidney disease, or unspecified chronic kidney disease: Secondary | ICD-10-CM | POA: Diagnosis not present

## 2017-09-03 ENCOUNTER — Ambulatory Visit (INDEPENDENT_AMBULATORY_CARE_PROVIDER_SITE_OTHER): Payer: Medicare Other | Admitting: Pharmacist

## 2017-09-03 DIAGNOSIS — Z8672 Personal history of thrombophlebitis: Secondary | ICD-10-CM | POA: Diagnosis not present

## 2017-09-03 DIAGNOSIS — Z7901 Long term (current) use of anticoagulants: Secondary | ICD-10-CM

## 2017-09-03 LAB — POCT INR: INR: 2.1 (ref 2.0–3.0)

## 2017-09-14 DIAGNOSIS — C61 Malignant neoplasm of prostate: Secondary | ICD-10-CM | POA: Diagnosis not present

## 2017-09-16 DIAGNOSIS — M79641 Pain in right hand: Secondary | ICD-10-CM | POA: Diagnosis not present

## 2017-09-23 DIAGNOSIS — C61 Malignant neoplasm of prostate: Secondary | ICD-10-CM | POA: Diagnosis not present

## 2017-09-23 DIAGNOSIS — N401 Enlarged prostate with lower urinary tract symptoms: Secondary | ICD-10-CM | POA: Diagnosis not present

## 2017-09-23 DIAGNOSIS — R351 Nocturia: Secondary | ICD-10-CM | POA: Diagnosis not present

## 2017-09-27 ENCOUNTER — Other Ambulatory Visit: Payer: Self-pay

## 2017-09-27 ENCOUNTER — Ambulatory Visit (INDEPENDENT_AMBULATORY_CARE_PROVIDER_SITE_OTHER): Payer: Medicare Other | Admitting: Family

## 2017-09-27 ENCOUNTER — Ambulatory Visit (HOSPITAL_COMMUNITY)
Admission: RE | Admit: 2017-09-27 | Discharge: 2017-09-27 | Disposition: A | Discharge status: Home / Self Care | Payer: Medicare Other | Source: Ambulatory Visit | Attending: Surgery | Admitting: Surgery

## 2017-09-27 ENCOUNTER — Encounter: Payer: Self-pay | Admitting: Family

## 2017-09-27 VITALS — BP 120/68 | HR 77 | Temp 97.3°F | Resp 16 | Ht 71.0 in | Wt 264.9 lb

## 2017-09-27 DIAGNOSIS — E1151 Type 2 diabetes mellitus with diabetic peripheral angiopathy without gangrene: Secondary | ICD-10-CM

## 2017-09-27 DIAGNOSIS — I779 Disorder of arteries and arterioles, unspecified: Secondary | ICD-10-CM | POA: Diagnosis not present

## 2017-09-27 DIAGNOSIS — E785 Hyperlipidemia, unspecified: Secondary | ICD-10-CM | POA: Diagnosis not present

## 2017-09-27 DIAGNOSIS — IMO0002 Reserved for concepts with insufficient information to code with codable children: Secondary | ICD-10-CM

## 2017-09-27 DIAGNOSIS — Z87891 Personal history of nicotine dependence: Secondary | ICD-10-CM

## 2017-09-27 DIAGNOSIS — Z86711 Personal history of pulmonary embolism: Secondary | ICD-10-CM

## 2017-09-27 DIAGNOSIS — Z89511 Acquired absence of right leg below knee: Secondary | ICD-10-CM

## 2017-09-27 DIAGNOSIS — R0989 Other specified symptoms and signs involving the circulatory and respiratory systems: Secondary | ICD-10-CM | POA: Diagnosis not present

## 2017-09-27 DIAGNOSIS — Z7901 Long term (current) use of anticoagulants: Secondary | ICD-10-CM | POA: Diagnosis not present

## 2017-09-27 DIAGNOSIS — E1165 Type 2 diabetes mellitus with hyperglycemia: Secondary | ICD-10-CM

## 2017-09-27 DIAGNOSIS — I10 Essential (primary) hypertension: Secondary | ICD-10-CM | POA: Insufficient documentation

## 2017-09-27 NOTE — Progress Notes (Signed)
[facility] [place]   CC: Follow up peripheral artery occlusive disease  History of Present Illness Patrick Shelton is a 77 y.o. male whom Patrick Shelton has been following for PAD, returns today for follow up. 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   His sx include B hand and left footpain,paraesthesia,and anesthesia, states he was diagnosed with gout in his hands and left foot, also has DM neuropathy. The patient's treatment regimen currently included: maximal medical management.   He is obese, his walking is more difficult due to left knee pain and instability. He is walking less, and likely does not walk enough to elicit claudication symptoms.  He is not walking much due to left LE gout, arthritis pain, and left foot worsening neuropathy sx's.  He also has severe gout in his right hand.   Pt denies non-healing wounds.   Pt denies history of stroke or TIA.   He has had dyspnea with walking, states his mucous is very thick, takes low strength guaifenesin which helps.  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  He has PTSD, and hasreceivedhelp from the Patrick Shelton.   He has stopped driving since mid 2774 due to not feeling the pedal controls athis left foot, has gout in both hands.   Diabetic: Yes, last A1C was 10.1 on 08-23-17 from [facility] (review of records),  increased from 9.4, he uses an insulin pump. He sees an endocrinologist, Patrick Shelton.  Tobacco use: former smoker, quit in 1979  Pt meds include:  Statin :Yes ASA: No Other anticoagulants/antiplatelets: coumadin; he has a history of PE and atrial fib    Past Medical History:  Diagnosis Date  . Arthritis Sept. 2015   Rh. Neck and Upper Back  . Arthritis Sept. 2015   Gout-Right Hand  Left knee  . Cancer (HCC)    8 wks Radiation  . Chronic kidney disease   . Diabetes mellitus   . ED (erectile  dysfunction)   . Hx of agent Orange exposure    while [place] in [place] during that conflict  . Hyperlipidemia   . Hypertension   . PVD (peripheral vascular disease) ([place])   . PVT (paroxysmal ventricular tachycardia) ([place])   . S/P BKA (below knee amputation) ([place])     Social History Social History   Tobacco Use  . Smoking status: Former Smoker    Packs/day: 0.25    Years: 10.00    Pack years: 2.50    Types: Cigarettes    Last attempt to quit: 03/09/1977    Years since quitting: 40.5  . Smokeless tobacco: Never Used  Substance Use Topics  . Alcohol use: No    Alcohol/week: 0.0 oz  . Drug use: No    Family History Family History  Problem Relation Age of Onset  . Dementia Mother   . Diabetes Father        Amputation- Bilateral leg  . Peripheral vascular disease Father     Past Surgical History:  Procedure Laterality Date  . AMPUTATION Right 2009   BKA  . PROSTATE SURGERY     8 wks radiation    Allergies  Allergen Reactions  . Prednisone Anaphylaxis and Shortness Of Breath    Current Outpatient Medications  Medication Sig Dispense Refill  . B Complex Vitamins (B COMPLEX PO) Take 1 tablet by mouth daily.     . Cholecalciferol (VITAMIN D-3) 5000 UNITS  TABS Take 5,000 Units by mouth daily.     Marland Kitchen ezetimibe (ZETIA) 10 MG tablet Take 5 mg by mouth daily.     . famotidine (PEPCID) 20 MG tablet One at bedtime 30 tablet 2  . hydrochlorothiazide 25 MG tablet Take 25 mg by mouth daily.      . Ipratropium-Albuterol (COMBIVENT) 20-100 MCG/ACT AERS respimat Inhale 1 puff into the lungs 3 (three) times daily. 4 g 6  . NOVOLOG 100 UNIT/ML injection Inject into the skin continuous. Via insulin pump  1  . pantoprazole (PROTONIX) 40 MG tablet Take 1 tablet (40 mg total) by mouth daily. Take 30-60 min before first meal of the day 30 tablet 2  . pravastatin (PRAVACHOL) 40 MG tablet Take 40 mg by mouth daily.      . valsartan (DIOVAN) 320 MG tablet Take 320 mg by mouth daily.     Marland Kitchen warfarin (COUMADIN) 2.5 MG tablet TAKE ONE-HALF (1/2) TO ONE TABLET DAILY AS DIRECTED BY COUMADIN CLINIC 90 tablet 1  . guaiFENesin (MUCINEX) 600 MG 12 hr tablet Take 600 mg by mouth 2 (two) times daily.     No current facility-administered medications for this visit.     ROS: See HPI for pertinent positives and negatives.   Physical Examination  Vitals:   09/27/17 1550  BP: 120/68  Pulse: 77  Resp: 16  Temp: (!) 97.3 F (36.3 C)  TempSrc: Oral  SpO2: 94%  Weight: 264 lb 14.4 oz (120.2 kg)  Height: 5' 11" (1.803 m)   Body mass index is 36.95 kg/m.  General: A&O x 3, WDWN, obese male. Gait: seated in his w/c HENT: No gross abnormalities.  Eyes: PERRLA. Pulmonary: Respirations are non labored, CTAB, good air movement in all fields Cardiac: regular rhythm, no detected murmur.         Carotid Bruits Right Left   Positive Positive   Radial pulses are 2+ palpable bilaterally   Adominal aortic pulse is not palpable                         VASCULAR EXAM: Extremities without ischemic changes, without Gangrene; without open wounds. Right BKA with prosthesis in place.  1+ pitting and non pitting pretibial and foot edema, left.                                                                                                           LE Pulses Right Left       FEMORAL  not palpable  2+ palpable        POPLITEAL  not palpable   not palpable       POSTERIOR TIBIAL  BKA   not palpable        DORSALIS PEDIS      ANTERIOR TIBIAL BKA  not palpable    Abdomen: soft, NT, no palpable masses. Large panus. Insulin pump insertion site, tubing, pump.  Skin: no rashes, no cellulitis, no ulcers noted. Musculoskeletal: no muscle wasting or atrophy. See Extremities.  Neurologic:  A&O X 3; appropriate affect, Sensation is normal; MOTOR FUNCTION:  moving all extremities equally, motor strength 5/5 throughout. Speech is fluent/normal. CN 2-12 intact. Psychiatric: Thought content is  normal, mood appropriate for clinical situation.     ASSESSMENT: Patrick Shelton is a 77 y.o. 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  He is ambulating less with his right BKA prosthesis since his left knee feels more unstable and is painful, left foot has worsening neuropathy. He is getting around more in his w/c.  He has no ulcers on his right BKA stump, no ischemic changes in his left LE. He has no claudication in his left LE.  His A1C is worsening form  9.0 to 10.1, uncontrolled. He has not smoked since 1979.  Left lower leg with 1+ non pitting edema, no erythema, no signs of cellulitis, no ulcers, + mild chronic venous stasis changes.  His atherosclerotic risk factors include uncontrolled DM, former smoker, stage 3 CKD (eGFR was 46, serum creatinine was 1.65 in June 2019), obesity, hypertension, exposure to agent orange, and dyslipidemia.    He is on coumadin for a history of pulmonary embolism.   Bilateral carotid bruits with no hx of stroke or TIA, no carodid duplex reslults on file, pt states this has not been checked at the [place], will obtain carotid duplex on his return in 6 months.  DATA   ABI (Date: 09/27/2017):  R: BKA   L:   ABI: [place] (was [place] on 03-23-17),   PT: mono  DP: dampened monophasic  TBI: 0.21 (was 0.41) Right BKA Non compressible ankle vessels, mon and dampened monophasic waveforms. Decline in left TBI.   No DVT on venous duplex 06/18/15    PLAN:  Based on the patient's vascular studies and examination, pt will return to clinic in 6 months with left ABI and carotid duplex.  Daily seated leg exercises discussed and demonstrated. Walk as much as possible also.   I discussed in depth with the patient the nature of atherosclerosis, and emphasized the importance of maximal medical management including strict control of blood pressure, blood glucose, and lipid  levels, obtaining regular exercise, and continued cessation of smoking.  The patient is aware that without maximal medical management the underlying atherosclerotic disease process will progress, limiting the benefit of any interventions.  The patient was given information about PAD including signs, symptoms, treatment, what symptoms should prompt the patient to seek immediate medical care, and risk reduction measures to take.  Patrick Chambers, RN, MSN, FNP-C [facility] Phone: [phone]  Clinic MD: Trula Slade  09/27/17 4:38 PM

## 2017-09-29 ENCOUNTER — Telehealth: Payer: Self-pay | Admitting: Cardiovascular Disease

## 2017-09-29 MED ORDER — WARFARIN SODIUM 2.5 MG PO TABS: ORAL_TABLET | ORAL | 1 refills | Status: DC

## 2017-09-29 NOTE — Telephone Encounter (Signed)
°*  STAT* If patient is at the pharmacy, call can be transferred to refill team.   1. Which medications need to be refilled? (please list name of each medication and dose if known) Warfarin 2.5mg   2. Which pharmacy/location (including street and city if local pharmacy) is medication to be sent to? [facility]  3. Do they need a 30 day or 90 day supply? 90day

## 2017-10-15 ENCOUNTER — Ambulatory Visit (INDEPENDENT_AMBULATORY_CARE_PROVIDER_SITE_OTHER): Payer: Medicare Other | Admitting: Pharmacist

## 2017-10-15 DIAGNOSIS — Z8672 Personal history of thrombophlebitis: Secondary | ICD-10-CM | POA: Diagnosis not present

## 2017-10-15 DIAGNOSIS — Z7901 Long term (current) use of anticoagulants: Secondary | ICD-10-CM | POA: Diagnosis not present

## 2017-10-15 LAB — POCT INR: INR: 2 (ref 2.0–3.0)

## 2017-11-02 DIAGNOSIS — E1351 Other specified diabetes mellitus with diabetic peripheral angiopathy without gangrene: Secondary | ICD-10-CM | POA: Diagnosis not present

## 2017-11-02 DIAGNOSIS — L602 Onychogryphosis: Secondary | ICD-10-CM | POA: Diagnosis not present

## 2017-12-06 ENCOUNTER — Ambulatory Visit (INDEPENDENT_AMBULATORY_CARE_PROVIDER_SITE_OTHER): Payer: Medicare Other | Admitting: Pharmacist Clinician (PhC)/ Clinical Pharmacy Specialist

## 2017-12-06 DIAGNOSIS — Z8672 Personal history of thrombophlebitis: Secondary | ICD-10-CM | POA: Diagnosis not present

## 2017-12-06 DIAGNOSIS — Z86711 Personal history of pulmonary embolism: Secondary | ICD-10-CM | POA: Diagnosis not present

## 2017-12-06 DIAGNOSIS — Z7901 Long term (current) use of anticoagulants: Secondary | ICD-10-CM

## 2017-12-06 LAB — POCT INR: INR: 2 (ref 2.0–3.0)

## 2017-12-06 NOTE — Patient Instructions (Signed)
Description   Continue with 1 tablet daily except 1/2 tablet each Monday, Wednesday and Friday. Repeat INR in 6 weeks.      

## 2017-12-27 ENCOUNTER — Encounter: Payer: Self-pay | Admitting: Pharmacist Clinician (PhC)/ Clinical Pharmacy Specialist

## 2017-12-27 DIAGNOSIS — E1165 Type 2 diabetes mellitus with hyperglycemia: Secondary | ICD-10-CM | POA: Diagnosis not present

## 2017-12-27 DIAGNOSIS — Z794 Long term (current) use of insulin: Secondary | ICD-10-CM | POA: Diagnosis not present

## 2017-12-27 DIAGNOSIS — E782 Mixed hyperlipidemia: Secondary | ICD-10-CM | POA: Diagnosis not present

## 2017-12-30 DIAGNOSIS — Z794 Long term (current) use of insulin: Secondary | ICD-10-CM | POA: Diagnosis not present

## 2017-12-30 DIAGNOSIS — E782 Mixed hyperlipidemia: Secondary | ICD-10-CM | POA: Diagnosis not present

## 2017-12-30 DIAGNOSIS — E1142 Type 2 diabetes mellitus with diabetic polyneuropathy: Secondary | ICD-10-CM | POA: Diagnosis not present

## 2017-12-30 DIAGNOSIS — E1165 Type 2 diabetes mellitus with hyperglycemia: Secondary | ICD-10-CM | POA: Diagnosis not present

## 2017-12-30 DIAGNOSIS — I129 Hypertensive chronic kidney disease with stage 1 through stage 4 chronic kidney disease, or unspecified chronic kidney disease: Secondary | ICD-10-CM | POA: Diagnosis not present

## 2017-12-30 DIAGNOSIS — E559 Vitamin D deficiency, unspecified: Secondary | ICD-10-CM | POA: Diagnosis not present

## 2017-12-30 DIAGNOSIS — Z9641 Presence of insulin pump (external) (internal): Secondary | ICD-10-CM | POA: Diagnosis not present

## 2017-12-30 DIAGNOSIS — E790 Hyperuricemia without signs of inflammatory arthritis and tophaceous disease: Secondary | ICD-10-CM | POA: Diagnosis not present

## 2017-12-30 DIAGNOSIS — N183 Chronic kidney disease, stage 3 (moderate): Secondary | ICD-10-CM | POA: Diagnosis not present

## 2018-01-11 DIAGNOSIS — L602 Onychogryphosis: Secondary | ICD-10-CM | POA: Diagnosis not present

## 2018-01-11 DIAGNOSIS — E1351 Other specified diabetes mellitus with diabetic peripheral angiopathy without gangrene: Secondary | ICD-10-CM | POA: Diagnosis not present

## 2018-01-17 ENCOUNTER — Ambulatory Visit (INDEPENDENT_AMBULATORY_CARE_PROVIDER_SITE_OTHER): Payer: Medicare Other | Admitting: Pharmacist Clinician (PhC)/ Clinical Pharmacy Specialist

## 2018-01-17 DIAGNOSIS — Z7901 Long term (current) use of anticoagulants: Secondary | ICD-10-CM | POA: Diagnosis not present

## 2018-01-17 DIAGNOSIS — Z8672 Personal history of thrombophlebitis: Secondary | ICD-10-CM | POA: Diagnosis not present

## 2018-01-17 LAB — POCT INR: INR: 2.3 (ref 2.0–3.0)

## 2018-01-17 NOTE — Patient Instructions (Signed)
Description   No warfarin Tuesday Nov 12 (due to bleed in eye), then resume with 1 tablet daily except 1/2 tablet each Monday, Wednesday and Friday.  Repeat INR in 6 weeks.  Call [phone] for concerns or to change your appointment,

## 2018-03-08 ENCOUNTER — Encounter (INDEPENDENT_AMBULATORY_CARE_PROVIDER_SITE_OTHER): Payer: Self-pay

## 2018-03-08 ENCOUNTER — Ambulatory Visit (INDEPENDENT_AMBULATORY_CARE_PROVIDER_SITE_OTHER): Payer: Medicare Other | Admitting: Pharmacist Clinician (PhC)/ Clinical Pharmacy Specialist

## 2018-03-08 DIAGNOSIS — Z8672 Personal history of thrombophlebitis: Secondary | ICD-10-CM

## 2018-03-08 DIAGNOSIS — Z7901 Long term (current) use of anticoagulants: Secondary | ICD-10-CM

## 2018-03-08 LAB — POCT INR: INR: 2.1 (ref 2.0–3.0)

## 2018-03-24 DIAGNOSIS — E1351 Other specified diabetes mellitus with diabetic peripheral angiopathy without gangrene: Secondary | ICD-10-CM | POA: Diagnosis not present

## 2018-03-24 DIAGNOSIS — L602 Onychogryphosis: Secondary | ICD-10-CM | POA: Diagnosis not present

## 2018-03-29 DIAGNOSIS — Z794 Long term (current) use of insulin: Secondary | ICD-10-CM | POA: Diagnosis not present

## 2018-03-29 DIAGNOSIS — E1165 Type 2 diabetes mellitus with hyperglycemia: Secondary | ICD-10-CM | POA: Diagnosis not present

## 2018-03-29 DIAGNOSIS — E782 Mixed hyperlipidemia: Secondary | ICD-10-CM | POA: Diagnosis not present

## 2018-03-29 NOTE — Progress Notes (Signed)
History of Present Illness:  Patient is a 78 y.o. year old male who presents for evaluation of PAD and carotid artery stenosis surveillance.    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  He has a prosthesis, but does not ambulate much.    He denies history of left foot non healing wounds.  He denies rest pain and ambulates less than 30 feet at a time.  He takes meticulous care of his left LE on a daily basis.    On his last exam he had carotid bruits B.  He denies history of stroke or TIA.  Carotid duplex was ordered for today as a baseline.      There is no history of ulcerations on the feet.  Atherosclerotic risk factors and other medical problems include uncontrolled DM, former smoker, stage 3 CKD, obesity, hypertension, exposure to agent orange, and dyslipidemia.He is on coumadin for a history of pulmonary embolism.     Past Medical History:  Diagnosis Date  . Arthritis Sept. 2015   Rh. Neck and Upper Back  . Arthritis Sept. 2015   Gout-Right Hand  Left knee  . Cancer (HCC)    8 wks Radiation  . Chronic kidney disease   . Diabetes mellitus   . ED (erectile dysfunction)   . Hx of agent Orange exposure    while [place] in [place] during that conflict  . Hyperlipidemia   . Hypertension   . PVD (peripheral vascular disease) ([place])   . PVT (paroxysmal ventricular tachycardia) ([place])   . S/P BKA (below knee amputation) [facility])     Past Surgical History:  Procedure Laterality Date  . AMPUTATION Right 2009   BKA  . PROSTATE SURGERY     8 wks radiation    ROS:   General:  No weight loss, Fever, chills  HEENT: No recent headaches, no nasal bleeding, no visual changes, no sore throat  Neurologic: No dizziness, blackouts, seizures. No recent symptoms of stroke or mini- stroke. No recent episodes of slurred speech, or temporary blindness.  Cardiac: No recent episodes of chest pain/pressure, no  shortness of breath at rest.  No shortness of breath with exertion.  Denies history of atrial fibrillation or irregular heartbeat  Vascular: No history of rest pain in feet.  No history of claudication.  No history of non-healing ulcer, No history of DVT   Pulmonary: No home oxygen, no productive cough, no hemoptysis,  No asthma or wheezing  Musculoskeletal:  [ ]  Arthritis, [ ]  Low back pain,  [ ]  Joint pain  Hematologic:No history of hypercoagulable state.  No history of easy bleeding.  No history of anemia  Gastrointestinal: No hematochezia or melena,  No gastroesophageal reflux, no trouble swallowing  Urinary: [ ]  chronic Kidney disease, [ ]  on HD - [ ]  MWF or [ ]  TTHS, [ ]  Burning with urination, [ ]  Frequent urination, [ ]  Difficulty urinating;   Skin: No rashes  Psychological: No history of anxiety,  No history of depression  Social History Social History   Tobacco Use  . Smoking status: Former Smoker    Packs/day: 0.25    Years: 10.00    Pack years: 2.50    Types: Cigarettes    Last attempt to quit: 03/09/1977    Years since quitting: 41.0  . Smokeless tobacco: Never Used  Substance Use Topics  . Alcohol use: No  Alcohol/week: 0.0 standard drinks  . Drug use: No    Family History Family History  Problem Relation Age of Onset  . Dementia Mother   . Diabetes Father        Amputation- Bilateral leg  . Peripheral vascular disease Father     Allergies  Allergies  Allergen Reactions  . Prednisone Anaphylaxis and Shortness Of Breath     Current Outpatient Medications  Medication Sig Dispense Refill  . B Complex Vitamins (B COMPLEX PO) Take 1 tablet by mouth daily.     . Cholecalciferol (VITAMIN D-3) 5000 UNITS TABS Take 5,000 Units by mouth daily.     Marland Kitchen ezetimibe (ZETIA) 10 MG tablet Take 5 mg by mouth daily.     . famotidine (PEPCID) 20 MG tablet One at bedtime 30 tablet 2  . guaiFENesin (MUCINEX) 600 MG 12 hr tablet Take 600 mg by mouth 2 (two) times  daily.    . hydrochlorothiazide 25 MG tablet Take 25 mg by mouth daily.      . Ipratropium-Albuterol (COMBIVENT) 20-100 MCG/ACT AERS respimat Inhale 1 puff into the lungs 3 (three) times daily. 4 g 6  . NOVOLOG 100 UNIT/ML injection Inject into the skin continuous. Via insulin pump  1  . pantoprazole (PROTONIX) 40 MG tablet Take 1 tablet (40 mg total) by mouth daily. Take 30-60 min before first meal of the day 30 tablet 2  . pravastatin (PRAVACHOL) 40 MG tablet Take 40 mg by mouth daily.      . valsartan (DIOVAN) 320 MG tablet Take 320 mg by mouth daily.    Marland Kitchen warfarin (COUMADIN) 2.5 MG tablet TAKE ONE-HALF (1/2) TO ONE TABLET DAILY AS DIRECTED BY COUMADIN CLINIC 90 tablet 1   No current facility-administered medications for this visit.     Physical Examination  There were no vitals filed for this visit.  There is no height or weight on file to calculate BMI.  General:  Alert and oriented, no acute distress HEENT: Normal, normocephalic Neck: No bruit or JVD Pulmonary: Clear to auscultation bilaterally Cardiac: Regular Rate and Rhythm without murmur Abdomen: Soft, non-tender, non-distended, no mass, no scars Skin: No rash Extremity Pulses:  2+ radial, brachial, not palpable femoral, dorsalis pedis, posterior tibial  Musculoskeletal: right BKA, left LE minimal edema  Neurologic:UE B motor intact with gout in the thumb and index fingers.  DATA:  Right Carotid Findings: +----------+--------+--------+--------+------------+--------+           PSV cm/sEDV cm/sStenosisDescribe    Comments +----------+--------+--------+--------+------------+--------+ CCA Prox  81      11      <50%    heterogenous         +----------+--------+--------+--------+------------+--------+ CCA Mid   60      9       <50%    heterogenous         +----------+--------+--------+--------+------------+--------+ CCA Distal56      10      <50%    heterogenous          +----------+--------+--------+--------+------------+--------+ ICA Prox  40      12      1-39%   heterogenous         +----------+--------+--------+--------+------------+--------+ ICA Mid   34      11                                   +----------+--------+--------+--------+------------+--------+ ICA Distal43  15                                   +----------+--------+--------+--------+------------+--------+ ECA       53                                           +----------+--------+--------+--------+------------+--------+  +----------+--------+-------+----------------+-------------------+           PSV cm/sEDV cmsDescribe        Arm Pressure (mmHG) +----------+--------+-------+----------------+-------------------+ [id number]            Multiphasic, WNL                    +----------+--------+-------+----------------+-------------------+  +---------+--------+--+--------+-+---------+ VertebralPSV cm/s38EDV cm/s7Antegrade +---------+--------+--+--------+-+---------+    Left Carotid Findings: +----------+--------+--------+--------+------------+--------------+           PSV cm/sEDV cm/sStenosisDescribe    Comments       +----------+--------+--------+--------+------------+--------------+ CCA Prox  65      14      <50%    heterogenous               +----------+--------+--------+--------+------------+--------------+ CCA Mid   75      16      <50%    heterogenous               +----------+--------+--------+--------+------------+--------------+ CCA Distal64      14                                         +----------+--------+--------+--------+------------+--------------+ ICA Prox  72      14      1-39%   heterogenous               +----------+--------+--------+--------+------------+--------------+ ICA Mid   110     11                                          +----------+--------+--------+--------+------------+--------------+ ICA Distal69      22                                         +----------+--------+--------+--------+------------+--------------+ ECA       63                      heterogenousNot visualized +----------+--------+--------+--------+------------+--------------+  +----------+--------+--------+----------------+-------------------+ SubclavianPSV cm/sEDV cm/sDescribe        Arm Pressure (mmHG) +----------+--------+--------+----------------+-------------------+           105             Multiphasic, WNL                    +----------+--------+--------+----------------+-------------------+  +---------+--------+--+--------+--+---------+ VertebralPSV cm/s52EDV cm/s12Antegrade +---------+--------+--+--------+--+---------+    Summary: Right Carotid: Velocities in the right ICA are consistent with a 1-39% stenosis.  Left Carotid: Velocities in the left ICA are consistent with a 1-39% stenosis.  Vertebrals:  Bilateral vertebral arteries demonstrate antegrade flow. Subclavians: Normal flow hemodynamics were seen in bilateral subclavian arteries.            ABI  Findings: +--------+------------------+-----+--------+--------+ Right   Rt Pressure (mmHg)IndexWaveformComment  +--------+------------------+-----+--------+--------+ [id number]                                     +--------+------------------+-----+--------+--------+ PTA                                    BKA      +--------+------------------+-----+--------+--------+  +---------+------------------+-----+----------+--------------------------------+ Left     Lt Pressure (mmHg)IndexWaveform  Comment                          +---------+------------------+-----+----------+--------------------------------+ Brachial 157                                                                +---------+------------------+-----+----------+--------------------------------+ PTA      50                0.32 monophasic                                 +---------+------------------+-----+----------+--------------------------------+ DP       255               1.62 monophasic                                 +---------+------------------+-----+----------+--------------------------------+ Great Toe                                 unable to obtain due to movement +---------+------------------+-----+----------+--------------------------------+  +-------+-----------+-----------+------------+------------+ ABI/TBIToday's ABIToday's TBIPrevious ABIPrevious TBI +-------+-----------+-----------+------------+------------+ Right  BKA                   BKA                      +-------+-----------+-----------+------------+------------+ Left   [place]         NA         [place]          0.41         +-------+-----------+-----------+------------+------------+         ASSESSMENT:  Negative study for carotid stenosis B < 39% Left LE PAD with calcified PT and non compressible  Unable to obtain TBI secondary to movement on the left LE.    PLAN:  He is trying to maintain his mobility with a walker/cane in his home.  He uses WC for mobility outside his house.  He uses an insulin pump for DM control, but his A1C is still around 10.  He is well cared for and takes good care of his Left foot and leg.    He will be scheduled for repeat ABI in 6 months.  If he has problems or concerns he will call sooner.  If he ever develops a wound on the left he will need an angiogram.  He does have CKD stage 3 for the past 10 years.    Roxy Horseman PA-C [facility] [place] Office: [phone]  MD in clinic: [facility]

## 2018-03-30 ENCOUNTER — Other Ambulatory Visit: Payer: Self-pay

## 2018-03-30 DIAGNOSIS — R0989 Other specified symptoms and signs involving the circulatory and respiratory systems: Secondary | ICD-10-CM

## 2018-03-30 DIAGNOSIS — I779 Disorder of arteries and arterioles, unspecified: Secondary | ICD-10-CM

## 2018-03-31 ENCOUNTER — Other Ambulatory Visit: Payer: Self-pay

## 2018-03-31 ENCOUNTER — Encounter: Payer: Self-pay | Admitting: *Deleted

## 2018-03-31 ENCOUNTER — Ambulatory Visit (INDEPENDENT_AMBULATORY_CARE_PROVIDER_SITE_OTHER)
Admission: RE | Admit: 2018-03-31 | Discharge: 2018-03-31 | Disposition: A | Discharge status: Home / Self Care | Payer: Medicare Other | Source: Ambulatory Visit | Attending: Family | Admitting: Family

## 2018-03-31 ENCOUNTER — Ambulatory Visit (HOSPITAL_COMMUNITY)
Admission: RE | Admit: 2018-03-31 | Discharge: 2018-03-31 | Disposition: A | Discharge status: Home / Self Care | Payer: Medicare Other | Source: Ambulatory Visit | Attending: Family | Admitting: Family

## 2018-03-31 ENCOUNTER — Ambulatory Visit (INDEPENDENT_AMBULATORY_CARE_PROVIDER_SITE_OTHER): Payer: Medicare Other | Admitting: Physician Assistant

## 2018-03-31 VITALS — BP 134/73 | HR 74 | Temp 97.0°F | Resp 20 | Ht 71.0 in | Wt 264.0 lb

## 2018-03-31 DIAGNOSIS — R0989 Other specified symptoms and signs involving the circulatory and respiratory systems: Secondary | ICD-10-CM | POA: Insufficient documentation

## 2018-03-31 DIAGNOSIS — I779 Disorder of arteries and arterioles, unspecified: Secondary | ICD-10-CM

## 2018-03-31 DIAGNOSIS — Z89511 Acquired absence of right leg below knee: Secondary | ICD-10-CM

## 2018-04-04 DIAGNOSIS — Z794 Long term (current) use of insulin: Secondary | ICD-10-CM | POA: Diagnosis not present

## 2018-04-04 DIAGNOSIS — Z9641 Presence of insulin pump (external) (internal): Secondary | ICD-10-CM | POA: Diagnosis not present

## 2018-04-04 DIAGNOSIS — E559 Vitamin D deficiency, unspecified: Secondary | ICD-10-CM | POA: Diagnosis not present

## 2018-04-04 DIAGNOSIS — E1165 Type 2 diabetes mellitus with hyperglycemia: Secondary | ICD-10-CM | POA: Diagnosis not present

## 2018-04-04 DIAGNOSIS — E790 Hyperuricemia without signs of inflammatory arthritis and tophaceous disease: Secondary | ICD-10-CM | POA: Diagnosis not present

## 2018-04-04 DIAGNOSIS — N183 Chronic kidney disease, stage 3 (moderate): Secondary | ICD-10-CM | POA: Diagnosis not present

## 2018-04-04 DIAGNOSIS — E782 Mixed hyperlipidemia: Secondary | ICD-10-CM | POA: Diagnosis not present

## 2018-04-04 DIAGNOSIS — I129 Hypertensive chronic kidney disease with stage 1 through stage 4 chronic kidney disease, or unspecified chronic kidney disease: Secondary | ICD-10-CM | POA: Diagnosis not present

## 2018-04-04 DIAGNOSIS — E1142 Type 2 diabetes mellitus with diabetic polyneuropathy: Secondary | ICD-10-CM | POA: Diagnosis not present

## 2018-04-19 ENCOUNTER — Ambulatory Visit (INDEPENDENT_AMBULATORY_CARE_PROVIDER_SITE_OTHER): Payer: Medicare Other | Admitting: Pharmacist Clinician (PhC)/ Clinical Pharmacy Specialist

## 2018-04-19 DIAGNOSIS — Z7901 Long term (current) use of anticoagulants: Secondary | ICD-10-CM

## 2018-04-19 DIAGNOSIS — Z8672 Personal history of thrombophlebitis: Secondary | ICD-10-CM | POA: Diagnosis not present

## 2018-04-19 LAB — POCT INR: INR: 2.2 (ref 2.0–3.0)

## 2018-04-26 ENCOUNTER — Encounter: Payer: Self-pay | Admitting: Adult Health

## 2018-04-26 ENCOUNTER — Ambulatory Visit (INDEPENDENT_AMBULATORY_CARE_PROVIDER_SITE_OTHER): Payer: Medicare Other | Admitting: Adult Health

## 2018-04-26 VITALS — BP 104/68 | HR 79 | Ht 71.0 in | Wt 262.2 lb

## 2018-04-26 DIAGNOSIS — I10 Essential (primary) hypertension: Secondary | ICD-10-CM | POA: Diagnosis not present

## 2018-04-26 DIAGNOSIS — I779 Disorder of arteries and arterioles, unspecified: Secondary | ICD-10-CM

## 2018-04-26 DIAGNOSIS — M1990 Unspecified osteoarthritis, unspecified site: Secondary | ICD-10-CM | POA: Insufficient documentation

## 2018-04-26 DIAGNOSIS — I4811 Longstanding persistent atrial fibrillation: Secondary | ICD-10-CM | POA: Diagnosis not present

## 2018-04-26 NOTE — Progress Notes (Signed)
+Cardiology Office Note   Date:  04/26/2018   ID:  Patrick Shelton, DOB [birth year], MRN [id number]  PCP:  Lorne Skeens, MD  Cardiologist:  [place]  No chief complaint on file.    History of Present Illness: Patrick Shelton is a 78 y.o. male who presents for ongoing assessment and management of chest pain. He has a lengthy history of PAD, PE and atrial fib on coumadin, hypertension, with other history of diabetes, PTSD, s/p R AKA, and CKD IV.  He was seen last in the coumadin clinic by [facility] and complained of chest pressure occurring intermittently. He reports that she listened to him with her stethoscope and told him his HR was slow and to follow up with Dr. Sallyanne Kuster. Due to scheduling issues, he is seen today.  He describes the pain as a pressure or heart burn over the left side of his chest which occurs occasionally. He denies severe pain, or associated dyspnea or diaphoresis when this occurs. He admits that his diabetes is not well controlled and can have discomfort when his blood sugar is elevated.   Past Medical History:  Diagnosis Date  . Arthritis Sept. 2015   Rh. Neck and Upper Back  . Arthritis Sept. 2015   Gout-Right Hand  Left knee  . Cancer (HCC)    8 wks Radiation  . Chronic kidney disease   . Diabetes mellitus   . ED (erectile dysfunction)   . Hx of agent Orange exposure    [place] during that conflict  . Hyperlipidemia   . Hypertension   . PVD (peripheral vascular disease) ([place])   . PVT (paroxysmal ventricular tachycardia) ([place])   . S/P BKA (below knee amputation) [facility])     Past Surgical History:  Procedure Laterality Date  . AMPUTATION Right 2009   BKA  . PROSTATE SURGERY     8 wks radiation     Current Outpatient Medications  Medication Sig Dispense Refill  . allopurinol (ZYLOPRIM) 100 MG tablet     . B Complex Vitamins (B COMPLEX PO) Take 1 tablet by mouth daily.     . Cholecalciferol (VITAMIN D-3) 5000 UNITS TABS  Take 5,000 Units by mouth daily.     . CONTOUR NEXT TEST test strip TEST BLOOD SUGAR LEVELS QID    . ezetimibe (ZETIA) 10 MG tablet Take 5 mg by mouth daily.     Marland Kitchen guaiFENesin (MUCINEX) 600 MG 12 hr tablet Take 600 mg by mouth 2 (two) times daily.    . hydrochlorothiazide 25 MG tablet Take 25 mg by mouth daily.      Marland Kitchen HYDROcodone-acetaminophen (NORCO/VICODIN) 5-325 MG tablet Take 1 tablet every 8 hours as needed for diabetic neuropathy pain    . losartan (COZAAR) 100 MG tablet     . NOVOLOG 100 UNIT/ML injection Inject into the skin continuous. Via insulin pump  1  . pravastatin (PRAVACHOL) 40 MG tablet Take 40 mg by mouth daily.      . VOLTAREN 1 % GEL     . warfarin (COUMADIN) 2.5 MG tablet TAKE ONE-HALF (1/2) TO ONE TABLET DAILY AS DIRECTED BY COUMADIN CLINIC 90 tablet 1   No current facility-administered medications for this visit.     Allergies:   Prednisone and Dulaglutide    Social History:  The patient  reports that he quit smoking about 41 years ago. His smoking use included cigarettes. He has a 2.50 pack-year smoking history. He has never used smokeless tobacco. He  reports that he does not drink alcohol or use drugs.   Family History:  The patient's family history includes Dementia in his mother; Diabetes in his father; Peripheral vascular disease in his father.    ROS: All other systems are reviewed and negative. Unless otherwise mentioned in H&P    PHYSICAL EXAM: VS:  BP 104/68   Pulse 79   Ht 5\' 11"  (1.803 m)   Wt 262 lb 3.2 oz (118.9 kg)   SpO2 98%   BMI 36.57 kg/m  , BMI Body mass index is 36.57 kg/m. GEN: Well nourished, well developed, in no acute distress, sitting in a wheelchair.  HEENT: normal Neck: no JVD, carotid bruits, or masses Cardiac: IRRR; no murmurs, rubs, or gallops, mild dependent edema  Respiratory:  Clear to auscultation bilaterally, normal work of breathing GI: soft, nontender, nondistended, + BS MS: no deformity or atrophy. Right AKA Skin:  warm and dry, no rash Neuro:  Strength and sensation are intact Psych: euthymic mood, full affect   EKG:  Atrial fib with RBBB rate of 79 bpm with LAFB.   Recent Labs: No results found for requested labs within last 8760 hours.    Lipid Panel    Component Value Date/Time   CHOL 156 05/24/2010 0134   TRIG 250 (H) 05/24/2010 0134   HDL 28 (L) 05/24/2010 0134   CHOLHDL 5.6 Ratio 05/24/2010 0134   VLDL 50 (H) 05/24/2010 0134   LDLCALC 78 05/24/2010 0134      Wt Readings from Last 3 Encounters:  04/26/18 262 lb 3.2 oz (118.9 kg)  03/31/18 264 lb (119.7 kg)  09/27/17 264 lb 14.4 oz (120.2 kg)    Other studies Reviewed: Echocardiogram February 10, 2015 Left ventricle: The cavity size was normal. There was mild   concentric hypertrophy. Systolic function was normal. The   estimated ejection fraction was in the range of 60% to 65%. Wall   motion was normal; there were no regional wall motion   abnormalities. Doppler parameters are consistent with abnormal   left ventricular relaxation (grade 1 diastolic dysfunction). - Aorta: Ascending aortic diameter: 38 mm (S). - Ascending aorta: The ascending aorta was mildly dilated. - Left atrium: The atrium was mildly dilated. - Right ventricle: The cavity size was normal. Wall thickness was   normal. Systolic function was normal. - Tricuspid valve: There was mild regurgitation. - Pulmonic valve: There was mild regurgitation. - Inferior vena cava: The vessel was normal in size. The   respirophasic diameter changes were in the normal range (>= 50%),   consistent with normal central venous pressure.  NM Stress Test:12/20/2009  Low risk   ASSESSMENT AND PLAN:  1. Chest pain: Atypical in presentation with intermittent fleeting pressure on the left side of the chest, non-radiating and without associated symptoms.   With multiple CVRF and no ischemic testing since 2011, I have offered a NM stress test to evaluate for ischemia. I have explained  the [place] test. He adamantly refuses this test. He does not "want to go through it again." He states that the pressure is not bad enough to make him feel badly.   For now will continue to treat him with medications. He refuses NTG. I also suggested repeat echo for changes in LV fx as this has not been completed since 2016. He refused this as well.   2. Atrial fib: Rate is controlled. He remains on coumadin for anticoagulation for this and for hx of PE. No changes,   3. PAD: He  is due for follow up ABI and carotid studies in one month. No changes in his regimen at this time.   Current medicines are reviewed at length with the patient today.    Labs/ tests ordered today include: None  Phill Myron. [place], ANP, AACC   04/26/2018 5:24 PM    [address] Office [phone] Fax [phone]

## 2018-04-26 NOTE — Patient Instructions (Signed)
Follow-Up: You will need a follow up appointment in July with Sanda Klein, MD, Jory Sims, DNP, [place]  or one of the following Advanced Practice Providers on your designated Care Team:  Almyra Deforest, PA-C  Fabian Sharp, [place]    Medication Instructions:  NO CHANGES- Your physician recommends that you continue on your current medications as directed. Please refer to the Current Medication list given to you today. If you need a refill on your cardiac medications before your next appointment, please call your pharmacy. Labwork: When you have labs (blood work) and your tests are completely normal, you will receive your results ONLY by [place] (if you have MyChart) -OR- A paper copy in the mail.  At [facility], you and your health needs are our priority.  As part of our continuing mission to provide you with exceptional heart care, we have created designated Provider Care Teams.  These Care Teams include your primary Cardiologist (physician) and Advanced Practice Providers (APPs -  Physician Assistants and Nurse Practitioners) who all work together to provide you with the care you need, when you need it.  Thank you for choosing [facility] at [facility]!!

## 2018-04-29 NOTE — Progress Notes (Signed)
Well, OK then. I guess he'll let us know if he wants anything MCr

## 2018-06-07 ENCOUNTER — Other Ambulatory Visit: Payer: Self-pay | Admitting: Cardiovascular Disease

## 2018-06-07 NOTE — Telephone Encounter (Signed)
Pt is overdue for follow-up.  Pt has appt scheduled for 06/14/18.  Will refill x 1 only.

## 2018-06-15 ENCOUNTER — Telehealth: Payer: Self-pay

## 2018-06-15 NOTE — Telephone Encounter (Signed)

## 2018-06-16 ENCOUNTER — Other Ambulatory Visit: Payer: Self-pay

## 2018-06-16 ENCOUNTER — Ambulatory Visit (INDEPENDENT_AMBULATORY_CARE_PROVIDER_SITE_OTHER): Payer: Medicare Other | Admitting: Pharmacist

## 2018-06-16 DIAGNOSIS — Z7901 Long term (current) use of anticoagulants: Secondary | ICD-10-CM

## 2018-06-16 DIAGNOSIS — Z8672 Personal history of thrombophlebitis: Secondary | ICD-10-CM | POA: Diagnosis not present

## 2018-06-16 LAB — POCT INR: INR: 1.7 — AB (ref 2.0–3.0)

## 2018-07-20 ENCOUNTER — Telehealth: Payer: Self-pay

## 2018-07-20 NOTE — Telephone Encounter (Signed)
1. Do you currently have a fever? No yes = cancel and refer to pcp for e-visit) 2. Have you recently travelled on a cruise, internationally, or to [place], [place], [place], [place], [place], or [place], [place] ([place]) ? no (yes = cancel, stay home, monitor symptoms, and contact pcp or initiate e-visit if symptoms develop) 3. Have you been in contact with someone that is currently pending confirmation of Covid19 testing or has been confirmed to have the Covid19 virus?  no (yes = cancel, stay home, away from tested individual, monitor symptoms, and contact pcp or initiate e-visit if symptoms develop) 4. Are you currently experiencing fatigue or cough? no (yes = pt should be prepared to have a mask placed at the time of their visit).  Pt. Advised that we are restricting visitors at this time and anyone present in the vehicle should meet the above criteria as well. Advised that visit will be at curbside for finger stick ONLY and will receive call with instructions. Pt also advised to please bring own pen for signature of arrival document.   

## 2018-07-21 ENCOUNTER — Ambulatory Visit (INDEPENDENT_AMBULATORY_CARE_PROVIDER_SITE_OTHER): Payer: Medicare Other | Admitting: Pharmacist

## 2018-07-21 DIAGNOSIS — Z7901 Long term (current) use of anticoagulants: Secondary | ICD-10-CM | POA: Diagnosis not present

## 2018-07-21 DIAGNOSIS — Z8672 Personal history of thrombophlebitis: Secondary | ICD-10-CM | POA: Diagnosis not present

## 2018-07-21 LAB — POCT INR: INR: 2.3 (ref 2.0–3.0)

## 2018-07-22 ENCOUNTER — Other Ambulatory Visit: Payer: Self-pay

## 2018-07-25 ENCOUNTER — Other Ambulatory Visit: Payer: Self-pay

## 2018-07-25 MED ORDER — WARFARIN SODIUM 2.5 MG PO TABS: ORAL_TABLET | ORAL | 1 refills | Status: DC

## 2018-07-27 ENCOUNTER — Telehealth: Payer: Self-pay

## 2018-07-27 MED ORDER — WARFARIN SODIUM 2.5 MG PO TABS: ORAL_TABLET | ORAL | 1 refills | Status: DC

## 2018-07-27 NOTE — Telephone Encounter (Signed)
rx sent

## 2018-08-16 DIAGNOSIS — L602 Onychogryphosis: Secondary | ICD-10-CM | POA: Diagnosis not present

## 2018-08-16 DIAGNOSIS — E1351 Other specified diabetes mellitus with diabetic peripheral angiopathy without gangrene: Secondary | ICD-10-CM | POA: Diagnosis not present

## 2018-08-25 ENCOUNTER — Telehealth: Payer: Self-pay

## 2018-08-25 NOTE — Telephone Encounter (Signed)

## 2018-08-30 DIAGNOSIS — M109 Gout, unspecified: Secondary | ICD-10-CM | POA: Diagnosis not present

## 2018-08-30 DIAGNOSIS — E1165 Type 2 diabetes mellitus with hyperglycemia: Secondary | ICD-10-CM | POA: Diagnosis not present

## 2018-08-30 DIAGNOSIS — Z794 Long term (current) use of insulin: Secondary | ICD-10-CM | POA: Diagnosis not present

## 2018-08-30 DIAGNOSIS — E782 Mixed hyperlipidemia: Secondary | ICD-10-CM | POA: Diagnosis not present

## 2018-09-05 ENCOUNTER — Ambulatory Visit (INDEPENDENT_AMBULATORY_CARE_PROVIDER_SITE_OTHER): Payer: Medicare Other | Admitting: *Deleted

## 2018-09-05 ENCOUNTER — Other Ambulatory Visit: Payer: Self-pay

## 2018-09-05 DIAGNOSIS — E1142 Type 2 diabetes mellitus with diabetic polyneuropathy: Secondary | ICD-10-CM | POA: Diagnosis not present

## 2018-09-05 DIAGNOSIS — Z7901 Long term (current) use of anticoagulants: Secondary | ICD-10-CM | POA: Diagnosis not present

## 2018-09-05 DIAGNOSIS — E782 Mixed hyperlipidemia: Secondary | ICD-10-CM | POA: Diagnosis not present

## 2018-09-05 DIAGNOSIS — N183 Chronic kidney disease, stage 3 (moderate): Secondary | ICD-10-CM | POA: Diagnosis not present

## 2018-09-05 DIAGNOSIS — Z794 Long term (current) use of insulin: Secondary | ICD-10-CM | POA: Diagnosis not present

## 2018-09-05 DIAGNOSIS — E1122 Type 2 diabetes mellitus with diabetic chronic kidney disease: Secondary | ICD-10-CM | POA: Diagnosis not present

## 2018-09-05 DIAGNOSIS — Z8672 Personal history of thrombophlebitis: Secondary | ICD-10-CM

## 2018-09-05 DIAGNOSIS — E559 Vitamin D deficiency, unspecified: Secondary | ICD-10-CM | POA: Diagnosis not present

## 2018-09-05 DIAGNOSIS — I129 Hypertensive chronic kidney disease with stage 1 through stage 4 chronic kidney disease, or unspecified chronic kidney disease: Secondary | ICD-10-CM | POA: Diagnosis not present

## 2018-09-05 DIAGNOSIS — Z9641 Presence of insulin pump (external) (internal): Secondary | ICD-10-CM | POA: Diagnosis not present

## 2018-09-05 DIAGNOSIS — E113593 Type 2 diabetes mellitus with proliferative diabetic retinopathy without macular edema, bilateral: Secondary | ICD-10-CM | POA: Diagnosis not present

## 2018-09-05 DIAGNOSIS — E790 Hyperuricemia without signs of inflammatory arthritis and tophaceous disease: Secondary | ICD-10-CM | POA: Diagnosis not present

## 2018-09-05 LAB — POCT INR: INR: 2 (ref 2.0–3.0)

## 2018-09-05 NOTE — Patient Instructions (Addendum)
   Description   Today take an extra 1/2 tablet, then Continue with 1 tablet daily except 1/2 tablet each Monday, Wednesday and Friday.  Repeat INR in 6 weeks. Call [phone] for concerns or to change your appointment.

## 2018-09-20 ENCOUNTER — Other Ambulatory Visit: Payer: Self-pay

## 2018-09-20 DIAGNOSIS — I779 Disorder of arteries and arterioles, unspecified: Secondary | ICD-10-CM

## 2018-09-28 ENCOUNTER — Telehealth (HOSPITAL_COMMUNITY): Payer: Self-pay | Admitting: Rehabilitation

## 2018-09-28 NOTE — Telephone Encounter (Signed)

## 2018-09-29 ENCOUNTER — Ambulatory Visit (INDEPENDENT_AMBULATORY_CARE_PROVIDER_SITE_OTHER): Payer: Medicare Other | Admitting: Family

## 2018-09-29 ENCOUNTER — Encounter: Payer: Self-pay | Admitting: Family

## 2018-09-29 ENCOUNTER — Ambulatory Visit (HOSPITAL_COMMUNITY)
Admission: RE | Admit: 2018-09-29 | Discharge: 2018-09-29 | Disposition: A | Discharge status: Home / Self Care | Payer: Medicare Other | Source: Ambulatory Visit | Attending: Family | Admitting: Family

## 2018-09-29 ENCOUNTER — Other Ambulatory Visit: Payer: Self-pay

## 2018-09-29 VITALS — BP 130/68 | HR 70 | Temp 98.1°F | Resp 20 | Ht 71.0 in | Wt 262.0 lb

## 2018-09-29 DIAGNOSIS — I779 Disorder of arteries and arterioles, unspecified: Secondary | ICD-10-CM | POA: Diagnosis not present

## 2018-09-29 DIAGNOSIS — Z86711 Personal history of pulmonary embolism: Secondary | ICD-10-CM | POA: Diagnosis not present

## 2018-09-29 DIAGNOSIS — Z7901 Long term (current) use of anticoagulants: Secondary | ICD-10-CM | POA: Diagnosis not present

## 2018-09-29 DIAGNOSIS — IMO0002 Reserved for concepts with insufficient information to code with codable children: Secondary | ICD-10-CM

## 2018-09-29 DIAGNOSIS — E1151 Type 2 diabetes mellitus with diabetic peripheral angiopathy without gangrene: Secondary | ICD-10-CM | POA: Diagnosis not present

## 2018-09-29 DIAGNOSIS — R0989 Other specified symptoms and signs involving the circulatory and respiratory systems: Secondary | ICD-10-CM | POA: Diagnosis not present

## 2018-09-29 DIAGNOSIS — E1165 Type 2 diabetes mellitus with hyperglycemia: Secondary | ICD-10-CM

## 2018-09-29 DIAGNOSIS — Z87891 Personal history of nicotine dependence: Secondary | ICD-10-CM

## 2018-09-29 DIAGNOSIS — Z89511 Acquired absence of right leg below knee: Secondary | ICD-10-CM

## 2018-09-29 NOTE — Progress Notes (Signed)
[facility] [place]   CC: Follow up peripheral artery occlusive disease  History of Present Illness Patrick Shelton is a 78 y.o. male whomDr. Bridgett Larsson had been following for PAD, returns today for follow up. 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   His sx include B hand and left footpain,paraesthesia,and anesthesia, states he was diagnosed with gout in his hands and left foot, also has DM neuropathy. The patient's treatment regimen currently included: maximal medical management.   He is obese, his walking is more difficult due to left knee pain and instability. He is walking less, and likely does not walk enough to elicit claudication symptoms. He is not walking much due to left LE gout,arthritis pain, and left footworsening neuropathy sx's.  He also has severe gout in his right hand.   Pt denies non-healing wounds.   Pt denies history of stroke or TIA.   He has had dyspnea with walking, states his mucous is very thick, takes low strength guaifenesin which helps.  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  He has PTSD, and hasreceivedhelp from the [employer].   He has stopped driving since mid 5035 due to not feeling the pedal controls athis left foot, has gout in both hands.   Diabetic: Yes,last A1C was 9.0 on 08-30-18, scanned results from another provider, he uses an insulin pump.He sees an endocrinologist, Dr.Altheimer. Tobacco use: former smoker, quit in 1979  Pt meds include:  Statin :Yes ASA: No Other anticoagulants/antiplatelets: coumadin; he has a history of PE and atrial fib    Past Medical History:  Diagnosis Date  . Arthritis Sept. 2015   Rh. Neck and Upper Back  . Arthritis Sept. 2015   Gout-Right Hand  Left knee  . Cancer (HCC)    8 wks Radiation  . Chronic kidney disease   . Diabetes mellitus   . ED (erectile dysfunction)   .  Hx of agent Orange exposure    while [place] during that conflict  . Hyperlipidemia   . Hypertension   . PVD (peripheral vascular disease) ([place])   . PVT (paroxysmal ventricular tachycardia) ([place])   . S/P BKA (below knee amputation) ([place])     Social History Social History   Tobacco Use  . Smoking status: Former Smoker    Packs/day: 0.25    Years: 10.00    Pack years: 2.50    Types: Cigarettes    Quit date: 03/09/1977    Years since quitting: 41.5  . Smokeless tobacco: Never Used  Substance Use Topics  . Alcohol use: No    Alcohol/week: 0.0 standard drinks  . Drug use: No    Family History Family History  Problem Relation Age of Onset  . Dementia Mother   . Diabetes Father        Amputation- Bilateral leg  . Peripheral vascular disease Father     Past Surgical History:  Procedure Laterality Date  . AMPUTATION Right 2009   BKA  . PROSTATE SURGERY     8 wks radiation    Allergies  Allergen Reactions  . Prednisone Anaphylaxis and Shortness Of Breath  . Dulaglutide Nausea Only    Current Outpatient Medications  Medication Sig Dispense Refill  . allopurinol (ZYLOPRIM) 100 MG tablet     . B Complex Vitamins (B COMPLEX PO) Take 1 tablet by mouth daily.     . Cholecalciferol (VITAMIN D-3)  5000 UNITS TABS Take 5,000 Units by mouth daily.     . CONTOUR NEXT TEST test strip TEST BLOOD SUGAR LEVELS QID    . ezetimibe (ZETIA) 10 MG tablet Take 5 mg by mouth daily.     . hydrochlorothiazide 25 MG tablet Take 25 mg by mouth daily.      Marland Kitchen HYDROcodone-acetaminophen (NORCO/VICODIN) 5-325 MG tablet Take 1 tablet every 8 hours as needed for diabetic neuropathy pain    . losartan (COZAAR) 100 MG tablet     . NOVOLOG 100 UNIT/ML injection Inject into the skin continuous. Via insulin pump  1  . pravastatin (PRAVACHOL) 40 MG tablet Take 40 mg by mouth daily.      . VOLTAREN 1 % GEL     . warfarin (COUMADIN) 2.5 MG tablet Take 1/2 to 1 tablet daily as instructed by  coumadin clinic. 90 tablet 1  . guaiFENesin (MUCINEX) 600 MG 12 hr tablet Take 600 mg by mouth 2 (two) times daily.     No current facility-administered medications for this visit.     ROS: See HPI for pertinent positives and negatives.   Physical Examination  Vitals:   09/29/18 1539  BP: 130/68  Pulse: 70  Resp: 20  Temp: 98.1 F (36.7 C)  SpO2: 95%  Weight: 262 lb (118.8 kg)  Height: '5\' 11"'$  (1.803 m)   Body mass index is 36.54 kg/m.  General: A&O x 3, WDWN, obese male. Gait: seated in w/c HEENT: No gross abnormalities.  Pulmonary: Respirations are non labored, CTAB, good air movement in all fields Cardiac: regular rhythm, no detected murmur.         Carotid Bruits Right Left   Negative Negative   Radial pulses are palpable bilaterally   Adominal aortic pulse is not palpable                         VASCULAR EXAM: Extremities without ischemic changes, without Gangrene; without open wounds. Right AKA, prosthesis in place 1+ non pitting pretibial and foot edema, left.  Hemosiderin staining left lower leg.                                                                                                           LE Pulses Right Left       FEMORAL  not palpable seated, obese  not palpable        POPLITEAL  not palpable   not palpable       POSTERIOR TIBIAL  BKA   not palpable        DORSALIS PEDIS      ANTERIOR TIBIAL BKA  Not palpable    Abdomen: soft, NT, no palpable masses. Large panus, insulin pump insertion site and tubing.  Skin: no rashes, no cellulitis, no ulcers noted. Musculoskeletal: no muscle wasting or atrophy.  Neurologic: A&O X 3; appropriate affect, Sensation is normal; MOTOR FUNCTION:  moving all extremities equally, motor strength 5/5 throughout. Speech is fluent/normal. CN 2-12 intact. Psychiatric: Thought content is normal,  mood appropriate for clinical situation.    ASSESSMENT: Patrick Shelton is a 78 y.o. 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  He is ambulatinglesswith his right BKA prosthesis since his left knee feels more unstable and is painful, left foot has worsening neuropathy. He is getting around more in his w/c, uses his walker or cane with walking.  He has no ulcers on his right BKA stump, no ischemic changes in his left LE. He has no claudication type symptoms in his left LE; his left lateral thigh has moderate pain after walking with his walker about 40 feet.   Left ABI today indicates non compressible arteries with monophasic waveforms. TBI improved, toe pressure of 59.   His A1C ishas improved from 10 to 9, uncontrolled but improved, he is working with an endocrinologist. He has not smoked since 1979.  Left lower leg with 1+ non pitting edema, no erythema, no signs of cellulitis, no ulcers, +mildchronic venous stasis changes. He sees a podiatrist on a regular basis.   His atherosclerotic risk factors include uncontrolled DM, former smoker, stage 3 CKD (eGFR was 46, serum creatinine was 1.65 in June 2019), obesity, hypertension, exposure to agent orange, and dyslipidemia.  He is on coumadin for a history of pulmonary embolism.   Carotid duplex in January 2020 showed 1-39% bilateral ICA stenosis, no need to repeat unless he becomes symptomatic.  DATA  ABI Findings (09-29-18):: +--------+------------------+-----+--------+--------+ Right   Rt Pressure (mmHg)IndexWaveformComment  +--------+------------------+-----+--------+--------+ [id number]                                     +--------+------------------+-----+--------+--------+  +---------+------------------+-----+----------+-------+ Left     Lt Pressure (mmHg)IndexWaveform  Comment +---------+------------------+-----+----------+-------+ Brachial 168                                       +---------+------------------+-----+----------+-------+ ATA      255               1.52 monophasic        +---------+------------------+-----+----------+-------+ PTA      255               1.52 monophasic        +---------+------------------+-----+----------+-------+ Great Toe59                0.35                   +---------+------------------+-----+----------+-------+  +-------+-----------+-----------+------------+------------+ ABI/TBIToday's ABIToday's TBIPrevious ABIPrevious TBI +-------+-----------+-----------+------------+------------+ Right  BKA                                            +-------+-----------+-----------+------------+------------+ Left   [place]         0.35       Tat[place]          not obtained +-------+-----------+-----------+------------+------------+ Previous ABI on 03/31/18 On 09/27/17 TBI was 0.21.  Summary: Right: BKA.   Left: Resting left ankle-brachial index indicates noncompressible left lower extremity arteries.The left toe-brachial index is abnormal. LT Great toe pressure = 59 mmHg.   No DVT on venous duplex 06/18/15    Plan:  Daily seated  leg exercises discussed and demonstrated. Walk as much as possible also.   Based on the patient's vascular studies and examination, pt will return to clinic in 6 months with left leg ABI.  I advised him to notify us if he develops worsening concerns about the circulation in his left foot or legs.   I discussed in depth with the patient the nature of atherosclerosis, and emphasized the importance of maximal medical management including strict control of blood pressure, blood glucose, and lipid levels, obtaining regular exercise, and continued cessation of smoking.  The patient is aware that without maximal medical management the underlying atherosclerotic disease process will progress, limiting the benefit of any interventions.  The patient was given information about PAD including signs,  symptoms, treatment, what symptoms should prompt the patient to seek immediate medical care, and risk reduction measures to take.  Clemon Chambers, RN, MSN, FNP-C [facility] Phone: [phone]  Clinic MD: Scot Dock  09/29/18 4:07 PM

## 2018-09-29 NOTE — Patient Instructions (Signed)
Peripheral Vascular Disease  Peripheral vascular disease (PVD) is a disease of the blood vessels that are not part of your heart and brain. A simple term for PVD is poor circulation. In most cases, PVD narrows the blood vessels that carry blood from your heart to the rest of your body. This can reduce the supply of blood to your arms, legs, and internal organs, like your stomach or kidneys. However, PVD most often affects a person's lower legs and feet. Without treatment, PVD tends to get worse. PVD can also lead to acute ischemic limb. This is when an arm or leg suddenly cannot get enough blood. This is a medical emergency. Follow these instructions at home: Lifestyle  Do not use any products that contain nicotine or tobacco, such as cigarettes and e-cigarettes. If you need help quitting, ask your doctor.  Lose weight if you are overweight. Or, stay at a healthy weight as told by your doctor.  Eat a diet that is low in fat and cholesterol. If you need help, ask your doctor.  Exercise regularly. Ask your doctor for activities that are right for you. General instructions  Take over-the-counter and prescription medicines only as told by your doctor.  Take good care of your feet: ? Wear comfortable shoes that fit well. ? Check your feet often for any cuts or sores.  Keep all follow-up visits as told by your doctor This is important. Contact a doctor if:  You have cramps in your legs when you walk.  You have leg pain when you are at rest.  You have coldness in a leg or foot.  Your skin changes.  You are unable to get or have an erection (erectile dysfunction).  You have cuts or sores on your feet that do not heal. Get help right away if:  Your arm or leg turns cold, numb, and blue.  Your arms or legs become red, warm, swollen, painful, or numb.  You have chest pain.  You have trouble breathing.  You suddenly have weakness in your face, arm, or leg.  You become very  confused or you cannot speak.  You suddenly have a very bad headache.  You suddenly cannot see. Summary  Peripheral vascular disease (PVD) is a disease of the blood vessels.  A simple term for PVD is poor circulation. Without treatment, PVD tends to get worse.  Treatment may include exercise, low fat and low cholesterol diet, and quitting smoking. This information is not intended to replace advice given to you by your health care provider. Make sure you discuss any questions you have with your health care provider. Document Released: 05/20/2009 Document Revised: 02/05/2017 Document Reviewed: 04/02/2016 Elsevier Patient Education  2020 [employer]  

## 2018-10-17 ENCOUNTER — Ambulatory Visit (INDEPENDENT_AMBULATORY_CARE_PROVIDER_SITE_OTHER): Payer: Medicare Other | Admitting: Pharmacist

## 2018-10-17 ENCOUNTER — Other Ambulatory Visit: Payer: Self-pay

## 2018-10-17 DIAGNOSIS — Z8672 Personal history of thrombophlebitis: Secondary | ICD-10-CM | POA: Diagnosis not present

## 2018-10-17 DIAGNOSIS — Z7901 Long term (current) use of anticoagulants: Secondary | ICD-10-CM | POA: Diagnosis not present

## 2018-10-17 LAB — POCT INR: INR: 2.5 (ref 2.0–3.0)

## 2018-10-27 DIAGNOSIS — E1351 Other specified diabetes mellitus with diabetic peripheral angiopathy without gangrene: Secondary | ICD-10-CM | POA: Diagnosis not present

## 2018-10-27 DIAGNOSIS — L602 Onychogryphosis: Secondary | ICD-10-CM | POA: Diagnosis not present

## 2018-11-03 ENCOUNTER — Telehealth: Payer: Self-pay | Admitting: *Deleted

## 2018-11-03 NOTE — Telephone Encounter (Signed)
Patrick Shelton refused an office visit.

## 2018-11-25 DIAGNOSIS — Z23 Encounter for immunization: Secondary | ICD-10-CM | POA: Diagnosis not present

## 2018-12-05 DIAGNOSIS — E1165 Type 2 diabetes mellitus with hyperglycemia: Secondary | ICD-10-CM | POA: Diagnosis not present

## 2018-12-05 DIAGNOSIS — Z794 Long term (current) use of insulin: Secondary | ICD-10-CM | POA: Diagnosis not present

## 2018-12-05 DIAGNOSIS — E559 Vitamin D deficiency, unspecified: Secondary | ICD-10-CM | POA: Diagnosis not present

## 2018-12-05 DIAGNOSIS — E782 Mixed hyperlipidemia: Secondary | ICD-10-CM | POA: Diagnosis not present

## 2018-12-12 ENCOUNTER — Other Ambulatory Visit: Payer: Self-pay

## 2018-12-12 ENCOUNTER — Ambulatory Visit (INDEPENDENT_AMBULATORY_CARE_PROVIDER_SITE_OTHER): Payer: Medicare Other | Admitting: Pharmacist

## 2018-12-12 DIAGNOSIS — Z7901 Long term (current) use of anticoagulants: Secondary | ICD-10-CM

## 2018-12-12 DIAGNOSIS — Z794 Long term (current) use of insulin: Secondary | ICD-10-CM | POA: Diagnosis not present

## 2018-12-12 DIAGNOSIS — Z8672 Personal history of thrombophlebitis: Secondary | ICD-10-CM | POA: Diagnosis not present

## 2018-12-12 DIAGNOSIS — E113593 Type 2 diabetes mellitus with proliferative diabetic retinopathy without macular edema, bilateral: Secondary | ICD-10-CM | POA: Diagnosis not present

## 2018-12-12 DIAGNOSIS — I129 Hypertensive chronic kidney disease with stage 1 through stage 4 chronic kidney disease, or unspecified chronic kidney disease: Secondary | ICD-10-CM | POA: Diagnosis not present

## 2018-12-12 DIAGNOSIS — E790 Hyperuricemia without signs of inflammatory arthritis and tophaceous disease: Secondary | ICD-10-CM | POA: Diagnosis not present

## 2018-12-12 DIAGNOSIS — E1142 Type 2 diabetes mellitus with diabetic polyneuropathy: Secondary | ICD-10-CM | POA: Diagnosis not present

## 2018-12-12 DIAGNOSIS — E559 Vitamin D deficiency, unspecified: Secondary | ICD-10-CM | POA: Diagnosis not present

## 2018-12-12 DIAGNOSIS — Z9641 Presence of insulin pump (external) (internal): Secondary | ICD-10-CM | POA: Diagnosis not present

## 2018-12-12 DIAGNOSIS — E782 Mixed hyperlipidemia: Secondary | ICD-10-CM | POA: Diagnosis not present

## 2018-12-12 DIAGNOSIS — E1122 Type 2 diabetes mellitus with diabetic chronic kidney disease: Secondary | ICD-10-CM | POA: Diagnosis not present

## 2018-12-12 DIAGNOSIS — N1831 Chronic kidney disease, stage 3a: Secondary | ICD-10-CM | POA: Diagnosis not present

## 2018-12-12 LAB — POCT INR: INR: 2.5 (ref 2.0–3.0)

## 2019-01-09 DIAGNOSIS — L602 Onychogryphosis: Secondary | ICD-10-CM | POA: Diagnosis not present

## 2019-01-09 DIAGNOSIS — E1351 Other specified diabetes mellitus with diabetic peripheral angiopathy without gangrene: Secondary | ICD-10-CM | POA: Diagnosis not present

## 2019-02-06 ENCOUNTER — Ambulatory Visit (INDEPENDENT_AMBULATORY_CARE_PROVIDER_SITE_OTHER): Payer: Medicare Other | Admitting: Pharmacist Clinician (PhC)/ Clinical Pharmacy Specialist

## 2019-02-06 ENCOUNTER — Other Ambulatory Visit: Payer: Self-pay

## 2019-02-06 DIAGNOSIS — Z7901 Long term (current) use of anticoagulants: Secondary | ICD-10-CM

## 2019-02-06 DIAGNOSIS — Z8672 Personal history of thrombophlebitis: Secondary | ICD-10-CM | POA: Diagnosis not present

## 2019-02-06 LAB — POCT INR: INR: 2.4 (ref 2.0–3.0)

## 2019-03-14 DIAGNOSIS — Z794 Long term (current) use of insulin: Secondary | ICD-10-CM | POA: Diagnosis not present

## 2019-03-14 DIAGNOSIS — E782 Mixed hyperlipidemia: Secondary | ICD-10-CM | POA: Diagnosis not present

## 2019-03-14 DIAGNOSIS — E1165 Type 2 diabetes mellitus with hyperglycemia: Secondary | ICD-10-CM | POA: Diagnosis not present

## 2019-03-20 DIAGNOSIS — N1831 Chronic kidney disease, stage 3a: Secondary | ICD-10-CM | POA: Diagnosis not present

## 2019-03-20 DIAGNOSIS — E790 Hyperuricemia without signs of inflammatory arthritis and tophaceous disease: Secondary | ICD-10-CM | POA: Diagnosis not present

## 2019-03-20 DIAGNOSIS — I739 Peripheral vascular disease, unspecified: Secondary | ICD-10-CM | POA: Diagnosis not present

## 2019-03-20 DIAGNOSIS — L602 Onychogryphosis: Secondary | ICD-10-CM | POA: Diagnosis not present

## 2019-03-20 DIAGNOSIS — E113593 Type 2 diabetes mellitus with proliferative diabetic retinopathy without macular edema, bilateral: Secondary | ICD-10-CM | POA: Diagnosis not present

## 2019-03-20 DIAGNOSIS — E559 Vitamin D deficiency, unspecified: Secondary | ICD-10-CM | POA: Diagnosis not present

## 2019-03-20 DIAGNOSIS — Z9641 Presence of insulin pump (external) (internal): Secondary | ICD-10-CM | POA: Diagnosis not present

## 2019-03-20 DIAGNOSIS — E1122 Type 2 diabetes mellitus with diabetic chronic kidney disease: Secondary | ICD-10-CM | POA: Diagnosis not present

## 2019-03-20 DIAGNOSIS — Z794 Long term (current) use of insulin: Secondary | ICD-10-CM | POA: Diagnosis not present

## 2019-03-20 DIAGNOSIS — E782 Mixed hyperlipidemia: Secondary | ICD-10-CM | POA: Diagnosis not present

## 2019-03-20 DIAGNOSIS — E1142 Type 2 diabetes mellitus with diabetic polyneuropathy: Secondary | ICD-10-CM | POA: Diagnosis not present

## 2019-03-20 DIAGNOSIS — E1351 Other specified diabetes mellitus with diabetic peripheral angiopathy without gangrene: Secondary | ICD-10-CM | POA: Diagnosis not present

## 2019-03-20 DIAGNOSIS — Z89511 Acquired absence of right leg below knee: Secondary | ICD-10-CM | POA: Diagnosis not present

## 2019-03-20 DIAGNOSIS — I129 Hypertensive chronic kidney disease with stage 1 through stage 4 chronic kidney disease, or unspecified chronic kidney disease: Secondary | ICD-10-CM | POA: Diagnosis not present

## 2019-04-05 ENCOUNTER — Ambulatory Visit (INDEPENDENT_AMBULATORY_CARE_PROVIDER_SITE_OTHER): Payer: Medicare Other | Admitting: Pharmacist

## 2019-04-05 ENCOUNTER — Other Ambulatory Visit: Payer: Self-pay

## 2019-04-05 DIAGNOSIS — Z7901 Long term (current) use of anticoagulants: Secondary | ICD-10-CM

## 2019-04-05 DIAGNOSIS — Z8672 Personal history of thrombophlebitis: Secondary | ICD-10-CM

## 2019-04-05 LAB — POCT INR: INR: 2.4 (ref 2.0–3.0)

## 2019-04-24 NOTE — Progress Notes (Signed)
Office Note     CC:  follow up Requesting Provider:  Altheimer, Legrand Como, MD  HPI: Patrick Shelton is a 79 y.o. (06/23/1940) male who presents routine follow up of PAD.  He has previously undergone revascularization of his right lower extremity due to critical limb ischemia and failed healing of toe amputations. Ultimately required right BKA. He has done well following R BKA. He uses prosthesis and does ambulate some using this.  He does not have any claudication symptoms, rest pain or non healing wounds of the left lower extremity. He does have a long history of DM neuropathy and continues to have trouble with numbness and paraesthesias of the left foot limiting his ability to ambulate.  Otherwise he does not have anything new medically since his last office visit  The pt is on Zetia, statin for cholesterol management.  The pt not on a daily aspirin.   Other AC:  Coumadin (hx PE) The pt is on CCB, HCTZ for hypertension.   The pt is diabetic.  Insulin, oral Tobacco hx: Former smoker, quit 1979  Past Medical History:  Diagnosis Date  . Arthritis Sept. 2015   Rh. Neck and Upper Back  . Arthritis Sept. 2015   Gout-Right Hand  Left knee  . Cancer (HCC)    8 wks Radiation  . Chronic kidney disease   . Diabetes mellitus   . ED (erectile dysfunction)   . Hx of agent Orange exposure    while [place] in [place] during that conflict  . Hyperlipidemia   . Hypertension   . PVD (peripheral vascular disease) ([place])   . PVT (paroxysmal ventricular tachycardia) ([place])   . S/P BKA (below knee amputation) [facility])     Past Surgical History:  Procedure Laterality Date  . AMPUTATION Right 2009   BKA  . PROSTATE SURGERY     8 wks radiation    Social History   Socioeconomic History  . Marital status: Married    Spouse name: Not on file  . Number of children: Not on file  . Years of education: Not on file  . Highest education level: Not on file  Occupational History  . Not on file    Tobacco Use  . Smoking status: Former Smoker    Packs/day: 0.25    Years: 10.00    Pack years: 2.50    Types: Cigarettes    Quit date: 03/09/1977    Years since quitting: 42.1  . Smokeless tobacco: Never Used  Substance and Sexual Activity  . Alcohol use: No    Alcohol/week: 0.0 standard drinks  . Drug use: No  . Sexual activity: Not on file  Other Topics Concern  . Not on file  Social History Narrative  . Not on file   Social Determinants of Health   Financial Resource Strain:   . Difficulty of Paying Living Expenses: Not on file  Food Insecurity:   . Worried About Charity fundraiser in the Last Year: Not on file  . Ran Out of Food in the Last Year: Not on file  Transportation Needs:   . Lack of Transportation (Medical): Not on file  . Lack of Transportation (Non-Medical): Not on file  Physical Activity:   . Days of Exercise per Week: Not on file  . Minutes of Exercise per Session: Not on file  Stress:   . Feeling of Stress : Not on file  Social Connections:   . Frequency of Communication with Friends and Family: Not  on file  . Frequency of Social Gatherings with Friends and Family: Not on file  . Attends Religious Services: Not on file  . Active Member of Clubs or Organizations: Not on file  . Attends Archivist Meetings: Not on file  . Marital Status: Not on file  Intimate Partner Violence:   . Fear of Current or Ex-Partner: Not on file  . Emotionally Abused: Not on file  . Physically Abused: Not on file  . Sexually Abused: Not on file    Family History  Problem Relation Age of Onset  . Dementia Mother   . Diabetes Father        Amputation- Bilateral leg  . Peripheral vascular disease Father     Current Outpatient Medications  Medication Sig Dispense Refill  . allopurinol (ZYLOPRIM) 100 MG tablet     . B Complex Vitamins (B COMPLEX PO) Take 1 tablet by mouth daily.     . Cholecalciferol (VITAMIN D-3) 5000 UNITS TABS Take 5,000 Units by mouth  daily.     . CONTOUR NEXT TEST test strip TEST BLOOD SUGAR LEVELS QID    . ezetimibe (ZETIA) 10 MG tablet Take 5 mg by mouth daily.     Marland Kitchen guaiFENesin (MUCINEX) 600 MG 12 hr tablet Take 600 mg by mouth 2 (two) times daily.    . hydrochlorothiazide 25 MG tablet Take 25 mg by mouth daily.      Marland Kitchen HYDROcodone-acetaminophen (NORCO/VICODIN) 5-325 MG tablet Take 1 tablet every 8 hours as needed for diabetic neuropathy pain    . losartan (COZAAR) 100 MG tablet     . NOVOLOG 100 UNIT/ML injection Inject into the skin continuous. Via insulin pump  1  . pravastatin (PRAVACHOL) 40 MG tablet Take 40 mg by mouth daily.      . VOLTAREN 1 % GEL     . warfarin (COUMADIN) 2.5 MG tablet Take 1/2 to 1 tablet daily as instructed by coumadin clinic. 90 tablet 1   No current facility-administered medications for this visit.    Allergies  Allergen Reactions  . Prednisone Anaphylaxis and Shortness Of Breath  . Dulaglutide Nausea Only     REVIEW OF SYSTEMS:   [X]  denotes positive finding, [ ]  denotes negative finding Cardiac  Comments:  Chest pain or chest pressure:    Shortness of breath upon exertion: X   Short of breath when lying flat:    Irregular heart rhythm:                                Pulmonary    Oxygen at home:    Productive cough:     Wheezing:         Neurologic    Sudden weakness in arms or legs:     Sudden numbness in arms or legs:     Sudden onset of difficulty speaking or slurred speech:    Temporary loss of vision in one eye:     Problems with dizziness:         Gastrointestinal    Blood in stool:     Vomited blood:         Genitourinary    Burning when urinating:     Blood in urine:        Psychiatric    Major depression:         Hematologic    Bleeding problems:    Problems with blood  clotting too easily:        Skin    Rashes or ulcers:        Constitutional    Fever or chills:      PHYSICAL EXAMINATION:  Vitals:   04/26/19 1528  BP: (!) 170/76    Pulse: 77  Temp: (!) 96.5 F (35.8 C)  SpO2: 96%    General:  In wheel chair, not in any pain Gait: Not observed HENT: WNL, normocephalic Pulmonary: normal non-labored breathing , without wheezing Cardiac: regular HR, without  Murmurs without carotid bruit Abdomen: soft, obese, NT Skin: without rashes Vascular Exam/Pulses:  Right Left  Radial 1+ (weak) 1+ (weak)  Ulnar 1+ (weak) 1+ (weak)  Femoral 2+ (normal) 2+ (normal)  Popliteal Not palpated with prosthesis present Not palpable  DP  Not palpable, monophasic Doppler signal   PT  Absent, no doppler signal   Extremities: without ischemic changes, without Gangrene , without cellulitis; without open wounds; Left foot warm, diminished sensation, normal motor Musculoskeletal: no muscle wasting or atrophy  Neurologic: A&O X 3;  No focal weakness or paresthesias are detected Psychiatric:  The pt has Normal affect.   Non-Invasive Vascular Imaging:  04/26/19 ABI Findings:  +--------+------------------+-----+--------+--------+  Right  Rt Pressure (mmHg)IndexWaveformComment   +--------+------------------+-----+--------+--------+  [id number]                     +--------+------------------+-----+--------+--------+   +---------+------------------+-----+----------+----------------+  Left   Lt Pressure (mmHg)IndexWaveform Comment       +---------+------------------+-----+----------+----------------+  Brachial 200           triphasic           +---------+------------------+-----+----------+----------------+  PTA   255        1.27 monophasicNon-Compressible  +---------+------------------+-----+----------+----------------+  DP    255        1.27 monophasicNon-Compressible  +---------+------------------+-----+----------+----------------+  Great Toe64        0.32 Abnormal            +---------+------------------+-----+----------+----------------+   +-------+----------------+-----------+----------------+------------+  ABI/TBIToday's ABI   Today's TBIPrevious ABI  Previous TBI  +-------+----------------+-----------+----------------+------------+  Right BKA       BKA    BKA       BKA       +-------+----------------+-----------+----------------+------------+  Left  Non-Compressible0.32    Non-Compressible0.35      +-------+----------------+-----------+----------------+------------+   Left ABIs and TBIs appear essentially unchanged compared to prior study on  09/29/2018.    Summary:  Right: Right below the knee amputation.    Left: Resting left ankle-brachial index indicates noncompressible left  lower extremity arteries. The left toe-brachial index is abnormal. LT  Great toe pressure = 64 mmHg.   ASSESSMENT/PLAN:: 79 y.o. male here for follow up for peripheral vascular disease. He is status post Right BKA. He does continue to ambulate some usually with a cane or walker, but only for short distances, otherwise he mostly uses his wheelchair secondary to arthritis pains in left knee and neuropathy in left foot. His Right BKA stump remains healed and without any ulceration or ischemic changes. His left lower extremity he continues to have neuropathy and arthritis pains but he does not describe any rest pain, or claudication symptoms,however this is likely hard to assess because of his limited ambulation.  He does not have any non healing wounds. He has a home health nurse that comes out 6 days a week to assess his feet.  I have encouraged him to keep his left foot protected. To continue to ambulate as  safely as possible. He will continue his Zetia and pravastatin. We discussed signs and symptoms of worsening PAD and those that would warrant earlier follow up or presentation to the emergency room.  He will follow up in 6  months with ABI/TBI   Karoline Caldwell, PA-C [facility] [phone]  Clinic MD:  Dr. Oneida Alar

## 2019-04-26 ENCOUNTER — Ambulatory Visit (HOSPITAL_COMMUNITY)
Admission: RE | Admit: 2019-04-26 | Discharge: 2019-04-26 | Disposition: A | Discharge status: Home / Self Care | Payer: Medicare Other | Source: Ambulatory Visit | Attending: Vascular Surgery | Admitting: Vascular Surgery

## 2019-04-26 ENCOUNTER — Other Ambulatory Visit: Payer: Self-pay

## 2019-04-26 ENCOUNTER — Ambulatory Visit (INDEPENDENT_AMBULATORY_CARE_PROVIDER_SITE_OTHER): Payer: Medicare Other | Admitting: Physician Assistant

## 2019-04-26 VITALS — BP 170/76 | HR 77 | Temp 96.5°F

## 2019-04-26 DIAGNOSIS — Z89511 Acquired absence of right leg below knee: Secondary | ICD-10-CM | POA: Diagnosis not present

## 2019-04-26 DIAGNOSIS — I779 Disorder of arteries and arterioles, unspecified: Secondary | ICD-10-CM | POA: Insufficient documentation

## 2019-04-28 ENCOUNTER — Other Ambulatory Visit: Payer: Self-pay | Admitting: *Deleted

## 2019-04-28 DIAGNOSIS — I779 Disorder of arteries and arterioles, unspecified: Secondary | ICD-10-CM

## 2019-05-10 DIAGNOSIS — Z86718 Personal history of other venous thrombosis and embolism: Secondary | ICD-10-CM | POA: Diagnosis not present

## 2019-05-10 DIAGNOSIS — Z7901 Long term (current) use of anticoagulants: Secondary | ICD-10-CM | POA: Diagnosis not present

## 2019-05-10 DIAGNOSIS — I480 Paroxysmal atrial fibrillation: Secondary | ICD-10-CM | POA: Diagnosis not present

## 2019-05-10 LAB — POCT INR: INR: 2.1 (ref 2.0–3.0)

## 2019-05-15 ENCOUNTER — Ambulatory Visit (INDEPENDENT_AMBULATORY_CARE_PROVIDER_SITE_OTHER): Payer: Medicare Other | Admitting: Cardiology

## 2019-05-15 DIAGNOSIS — Z7901 Long term (current) use of anticoagulants: Secondary | ICD-10-CM

## 2019-05-15 DIAGNOSIS — Z8672 Personal history of thrombophlebitis: Secondary | ICD-10-CM | POA: Diagnosis not present

## 2019-05-18 MED ORDER — APIXABAN 5 MG PO TABS: 5.0000 mg | ORAL_TABLET | Freq: Two times a day (BID) | ORAL | 1 refills | Status: AC

## 2019-05-24 ENCOUNTER — Ambulatory Visit (INDEPENDENT_AMBULATORY_CARE_PROVIDER_SITE_OTHER): Payer: Medicare Other | Admitting: Cardiovascular Disease

## 2019-05-24 DIAGNOSIS — Z8672 Personal history of thrombophlebitis: Secondary | ICD-10-CM

## 2019-05-24 DIAGNOSIS — Z7901 Long term (current) use of anticoagulants: Secondary | ICD-10-CM | POA: Diagnosis not present

## 2019-05-24 LAB — POCT INR: INR: 2 (ref 2.0–3.0)

## 2019-05-29 DIAGNOSIS — E1351 Other specified diabetes mellitus with diabetic peripheral angiopathy without gangrene: Secondary | ICD-10-CM | POA: Diagnosis not present

## 2019-05-29 DIAGNOSIS — L602 Onychogryphosis: Secondary | ICD-10-CM | POA: Diagnosis not present

## 2019-06-15 DIAGNOSIS — H26491 Other secondary cataract, right eye: Secondary | ICD-10-CM | POA: Diagnosis not present

## 2019-06-15 DIAGNOSIS — H43813 Vitreous degeneration, bilateral: Secondary | ICD-10-CM | POA: Diagnosis not present

## 2019-06-15 DIAGNOSIS — E113593 Type 2 diabetes mellitus with proliferative diabetic retinopathy without macular edema, bilateral: Secondary | ICD-10-CM | POA: Diagnosis not present

## 2019-06-15 DIAGNOSIS — Z961 Presence of intraocular lens: Secondary | ICD-10-CM | POA: Diagnosis not present

## 2019-07-12 DIAGNOSIS — E790 Hyperuricemia without signs of inflammatory arthritis and tophaceous disease: Secondary | ICD-10-CM | POA: Diagnosis not present

## 2019-07-12 DIAGNOSIS — E1165 Type 2 diabetes mellitus with hyperglycemia: Secondary | ICD-10-CM | POA: Diagnosis not present

## 2019-07-12 DIAGNOSIS — Z794 Long term (current) use of insulin: Secondary | ICD-10-CM | POA: Diagnosis not present

## 2019-07-12 DIAGNOSIS — E782 Mixed hyperlipidemia: Secondary | ICD-10-CM | POA: Diagnosis not present

## 2019-07-12 DIAGNOSIS — Z8546 Personal history of malignant neoplasm of prostate: Secondary | ICD-10-CM | POA: Diagnosis not present

## 2019-07-13 DIAGNOSIS — E1142 Type 2 diabetes mellitus with diabetic polyneuropathy: Secondary | ICD-10-CM | POA: Diagnosis not present

## 2019-07-13 DIAGNOSIS — I739 Peripheral vascular disease, unspecified: Secondary | ICD-10-CM | POA: Diagnosis not present

## 2019-07-13 DIAGNOSIS — Z794 Long term (current) use of insulin: Secondary | ICD-10-CM | POA: Diagnosis not present

## 2019-07-13 DIAGNOSIS — Z89511 Acquired absence of right leg below knee: Secondary | ICD-10-CM | POA: Diagnosis not present

## 2019-07-13 DIAGNOSIS — E1122 Type 2 diabetes mellitus with diabetic chronic kidney disease: Secondary | ICD-10-CM | POA: Diagnosis not present

## 2019-07-13 DIAGNOSIS — N1831 Chronic kidney disease, stage 3a: Secondary | ICD-10-CM | POA: Diagnosis not present

## 2019-07-13 DIAGNOSIS — E782 Mixed hyperlipidemia: Secondary | ICD-10-CM | POA: Diagnosis not present

## 2019-07-13 DIAGNOSIS — Z8546 Personal history of malignant neoplasm of prostate: Secondary | ICD-10-CM | POA: Diagnosis not present

## 2019-07-13 DIAGNOSIS — E1165 Type 2 diabetes mellitus with hyperglycemia: Secondary | ICD-10-CM | POA: Diagnosis not present

## 2019-07-13 DIAGNOSIS — E113593 Type 2 diabetes mellitus with proliferative diabetic retinopathy without macular edema, bilateral: Secondary | ICD-10-CM | POA: Diagnosis not present

## 2019-07-13 DIAGNOSIS — I129 Hypertensive chronic kidney disease with stage 1 through stage 4 chronic kidney disease, or unspecified chronic kidney disease: Secondary | ICD-10-CM | POA: Diagnosis not present

## 2019-07-13 DIAGNOSIS — Z9641 Presence of insulin pump (external) (internal): Secondary | ICD-10-CM | POA: Diagnosis not present

## 2019-08-24 DIAGNOSIS — Z20828 Contact with and (suspected) exposure to other viral communicable diseases: Secondary | ICD-10-CM | POA: Diagnosis not present

## 2019-09-14 ENCOUNTER — Telehealth: Payer: Self-pay

## 2019-09-14 NOTE — Telephone Encounter (Signed)
Pt needs reason why he is on Eliquis to give to [place] in order to get them to pay for medication.  Will send copy of last OV with Jory Sims, which notes history of PE.   Patient voiced appreciation

## 2019-09-14 NOTE — Telephone Encounter (Signed)
Patient requesting a callback from Patrick Shelton directly will route to her

## 2019-10-10 DIAGNOSIS — E782 Mixed hyperlipidemia: Secondary | ICD-10-CM | POA: Diagnosis not present

## 2019-10-10 DIAGNOSIS — E1165 Type 2 diabetes mellitus with hyperglycemia: Secondary | ICD-10-CM | POA: Diagnosis not present

## 2019-10-10 DIAGNOSIS — Z794 Long term (current) use of insulin: Secondary | ICD-10-CM | POA: Diagnosis not present

## 2019-10-27 DIAGNOSIS — E113593 Type 2 diabetes mellitus with proliferative diabetic retinopathy without macular edema, bilateral: Secondary | ICD-10-CM | POA: Diagnosis not present

## 2019-10-27 DIAGNOSIS — Z794 Long term (current) use of insulin: Secondary | ICD-10-CM | POA: Diagnosis not present

## 2019-10-27 DIAGNOSIS — I10 Essential (primary) hypertension: Secondary | ICD-10-CM | POA: Diagnosis not present

## 2019-10-27 DIAGNOSIS — E782 Mixed hyperlipidemia: Secondary | ICD-10-CM | POA: Diagnosis not present

## 2019-10-27 DIAGNOSIS — Z9641 Presence of insulin pump (external) (internal): Secondary | ICD-10-CM | POA: Diagnosis not present

## 2019-10-27 DIAGNOSIS — E1165 Type 2 diabetes mellitus with hyperglycemia: Secondary | ICD-10-CM | POA: Diagnosis not present

## 2019-11-01 DIAGNOSIS — Z87828 Personal history of other (healed) physical injury and trauma: Secondary | ICD-10-CM | POA: Diagnosis not present

## 2019-11-01 DIAGNOSIS — G629 Polyneuropathy, unspecified: Secondary | ICD-10-CM | POA: Diagnosis not present

## 2019-11-01 DIAGNOSIS — Z7409 Other reduced mobility: Secondary | ICD-10-CM | POA: Diagnosis not present

## 2019-11-01 DIAGNOSIS — Z89511 Acquired absence of right leg below knee: Secondary | ICD-10-CM | POA: Diagnosis not present

## 2019-11-01 DIAGNOSIS — R269 Unspecified abnormalities of gait and mobility: Secondary | ICD-10-CM | POA: Diagnosis not present

## 2019-11-01 DIAGNOSIS — E119 Type 2 diabetes mellitus without complications: Secondary | ICD-10-CM | POA: Diagnosis not present

## 2019-11-01 DIAGNOSIS — M109 Gout, unspecified: Secondary | ICD-10-CM | POA: Diagnosis not present

## 2019-11-01 DIAGNOSIS — R29898 Other symptoms and signs involving the musculoskeletal system: Secondary | ICD-10-CM | POA: Diagnosis not present

## 2019-11-20 DIAGNOSIS — E1351 Other specified diabetes mellitus with diabetic peripheral angiopathy without gangrene: Secondary | ICD-10-CM | POA: Diagnosis not present

## 2019-11-20 DIAGNOSIS — L602 Onychogryphosis: Secondary | ICD-10-CM | POA: Diagnosis not present

## 2020-02-14 DIAGNOSIS — L602 Onychogryphosis: Secondary | ICD-10-CM | POA: Diagnosis not present

## 2020-02-14 DIAGNOSIS — E1351 Other specified diabetes mellitus with diabetic peripheral angiopathy without gangrene: Secondary | ICD-10-CM | POA: Diagnosis not present

## 2020-03-20 ENCOUNTER — Encounter: Payer: Self-pay | Admitting: Vascular Surgery

## 2020-03-20 ENCOUNTER — Ambulatory Visit: Payer: Medicare Other

## 2020-03-20 ENCOUNTER — Ambulatory Visit (INDEPENDENT_AMBULATORY_CARE_PROVIDER_SITE_OTHER): Payer: Medicare Other | Admitting: Vascular Surgery

## 2020-03-20 ENCOUNTER — Ambulatory Visit (HOSPITAL_COMMUNITY)
Admission: RE | Admit: 2020-03-20 | Discharge: 2020-03-20 | Disposition: A | Discharge status: Home / Self Care | Payer: Medicare Other | Source: Ambulatory Visit | Attending: Vascular Surgery | Admitting: Vascular Surgery

## 2020-03-20 ENCOUNTER — Other Ambulatory Visit: Payer: Self-pay

## 2020-03-20 VITALS — BP 148/56 | HR 80 | Temp 98.1°F | Resp 20 | Ht 71.0 in | Wt 262.0 lb

## 2020-03-20 DIAGNOSIS — I779 Disorder of arteries and arterioles, unspecified: Secondary | ICD-10-CM

## 2020-03-20 NOTE — Progress Notes (Signed)
REASON FOR VISIT:   Follow-up of peripheral vascular disease.  MEDICAL ISSUES:   PERIPHERAL VASCULAR DISEASE: This patient has stable infrainguinal arterial occlusive disease.  I have encouraged him to stay as active as possible.  Fortunately he is not a smoker.  I have ordered follow-up ABIs in 1 year and I will see him back at that time.  He knows to call sooner if he has problems.  CHRONIC VENOUS INSUFFICIENCY: He has CEAP C4a venous disease.  We have discussed the importance of intermittent leg elevation and the proper positioning for this.  I encouraged him to avoid prolonged sitting and standing.  He knows to keep his skin well lubricated in the winter.  HPI:   Patrick Shelton is a pleasant 80 y.o. male who had previously been followed by Dr. Bridgett Larsson with peripheral vascular disease.  He was last seen by Karoline Caldwell, PA on 04/26/2019 for continued follow-up of his peripheral vascular disease.  He had previously undergone revascularization of the right lower extremity for critical limb ischemia but ultimately required a right below the knee amputation.  At the time of his last visit, on the left side he had monophasic Doppler signals in the left foot with an ABI of 100% although this was likely falsely elevated secondary to calcific disease.  His toe pressure was 64 mmHg.  He comes in for routine follow-up visit.  On my history, the patient denies any history of claudication, rest pain, or nonhealing ulcers.  He does complain of some chronic swelling in the left lower extremity.  I think he spends a fair amount of time in his wheelchair.  He is ambulatory with his prosthesis.  Past Medical History:  Diagnosis Date  . Arthritis Sept. 2015   Rh. Neck and Upper Back  . Arthritis Sept. 2015   Gout-Right Hand  Left knee  . Cancer (HCC)    8 wks Radiation  . Chronic kidney disease   . Diabetes mellitus   . ED (erectile dysfunction)   . Hx of agent Orange exposure    while [place] in  [place] during that conflict  . Hyperlipidemia   . Hypertension   . PVD (peripheral vascular disease) ([place])   . PVT (paroxysmal ventricular tachycardia) ([place])   . S/P BKA (below knee amputation) (HCC)     Family History  Problem Relation Age of Onset  . Dementia Mother   . Diabetes Father        Amputation- Bilateral leg  . Peripheral vascular disease Father     SOCIAL HISTORY: Social History   Tobacco Use  . Smoking status: Former Smoker    Packs/day: 0.25    Years: 10.00    Pack years: 2.50    Types: Cigarettes    Quit date: 03/09/1977    Years since quitting: 43.0  . Smokeless tobacco: Never Used  Substance Use Topics  . Alcohol use: No    Alcohol/week: 0.0 standard drinks    Allergies  Allergen Reactions  . Prednisone Anaphylaxis and Shortness Of Breath  . Dulaglutide Nausea Only    Current Outpatient Medications  Medication Sig Dispense Refill  . allopurinol (ZYLOPRIM) 100 MG tablet     . apixaban (ELIQUIS) 5 MG TABS tablet Take 1 tablet (5 mg total) by mouth 2 (two) times daily. 180 tablet 1  . B Complex Vitamins (B COMPLEX PO) Take 1 tablet by mouth daily.     . Cholecalciferol (VITAMIN D-3) 5000 UNITS TABS Take 5,000  Units by mouth daily.     . CONTOUR NEXT TEST test strip TEST BLOOD SUGAR LEVELS QID    . ezetimibe (ZETIA) 10 MG tablet Take 5 mg by mouth daily.    Marland Kitchen guaiFENesin (MUCINEX) 600 MG 12 hr tablet Take 600 mg by mouth 2 (two) times daily.    . hydrochlorothiazide 25 MG tablet Take 25 mg by mouth daily.    Marland Kitchen HYDROcodone-acetaminophen (NORCO/VICODIN) 5-325 MG tablet Take 1 tablet every 8 hours as needed for diabetic neuropathy pain    . losartan (COZAAR) 100 MG tablet     . NOVOLOG 100 UNIT/ML injection Inject into the skin continuous. Via insulin pump  1  . pravastatin (PRAVACHOL) 40 MG tablet Take 40 mg by mouth daily.    . VOLTAREN 1 % GEL      No current facility-administered medications for this visit.    REVIEW OF SYSTEMS:  [X]  denotes  positive finding, [ ]  denotes negative finding Cardiac  Comments:  Chest pain or chest pressure:    Shortness of breath upon exertion:    Short of breath when lying flat:    Irregular heart rhythm:        Vascular    Pain in calf, thigh, or hip brought on by ambulation:    Pain in feet at night that wakes you up from your sleep:     Blood clot in your veins:    Leg swelling:         Pulmonary    Oxygen at home:    Productive cough:     Wheezing:         Neurologic    Sudden weakness in arms or legs:     Sudden numbness in arms or legs:     Sudden onset of difficulty speaking or slurred speech:    Temporary loss of vision in one eye:     Problems with dizziness:         Gastrointestinal    Blood in stool:     Vomited blood:         Genitourinary    Burning when urinating:     Blood in urine:        Psychiatric    Major depression:         Hematologic    Bleeding problems:    Problems with blood clotting too easily:        Skin    Rashes or ulcers:        Constitutional    Fever or chills:     PHYSICAL EXAM:   Vitals:   03/20/20 1428  BP: (!) 148/56  Pulse: 80  Resp: 20  Temp: 98.1 F (36.7 C)  Weight: 262 lb (118.8 kg)  Height: 5\' 11"  (1.803 m)    GENERAL: The patient is a well-nourished male, in no acute distress. The vital signs are documented above. CARDIAC: There is a regular rate and rhythm.  VASCULAR: I do not detect carotid bruits. I cannot palpate pedal pulses on the left however the left foot is warm and well-perfused. He has moderate left lower extremity swelling. He has hyperpigmentation on the left consistent with chronic venous insufficiency. PULMONARY: There is good air exchange bilaterally without wheezing or rales. ABDOMEN: Soft and non-tender with normal pitched bowel sounds.  MUSCULOSKELETAL: He has a right below the knee amputation NEUROLOGIC: No focal weakness or paresthesias are detected. SKIN: There are no ulcers or rashes  noted. PSYCHIATRIC: The patient has  a normal affect.  DATA:    ARTERIAL DOPPLER STUDY: I have independently interpreted his arterial Doppler study today.  This was of the left lower extremity only as he has a right below the knee amputation.  On the left side he has a monophasic posterior tibial signal with a biphasic dorsalis pedis signal.  The arteries are not compressible.  ABI cannot be obtained.  Deitra Mayo [facility] [phone]

## 2020-03-27 ENCOUNTER — Ambulatory Visit: Payer: Medicare Other | Admitting: Cardiovascular Disease

## 2020-04-16 NOTE — Progress Notes (Signed)
Cardiology Office Note   Date:  04/17/2020   ID:  Patrick Shelton, DOB 16-Feb-1941, MRN [id number]  PCP:  Lorne Skeens, MD  Cardiologist:  Sanda Klein, MD EP: None  Chief Complaint  Patient presents with  . Follow-up    Atrial fibrillation  . Fatigue      History of Present Illness: Patrick Shelton is a 80 y.o. male with a PMH of PAD s/p right BKA, paroxysmal atrial fibrillation, HTN, HLD, DM type 2, PTSD, CKD stage 3, and history of PE who presents for routine follow-up.  He was last evaluated by cardiology at an outpatient visit with Jory Sims 04/2018, at which time he had complaints of atypical chest pain, though refused NST and an echocardiogram, as well as an Rx for SL nitro at that time. He was recommended to follow-up in 5 months, however has not been seen since that time. 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    He presents today for routine follow-up. He reports his biggest issue is fatigue which he feels has been worse over the past several months/year. He is not as active as he use to be which he in part attributes to his progressive neuropathy and joint pains. He has some DOE which is overall unchanged in the past several months which he attributes to weight gain and inactivity. He also has LE edema which unchanged from previous. He denies chest pain, dizziness, lightheadedness, or syncope. He is unaware of his atrial fibrillation. He does not recall whether this is permanent or paroxysmal. He does not monitor his blood pressure at home but reports it is frequently elevated at office visits. He is hesitant to change any medications today.     Past Medical History:  Diagnosis Date  . Arthritis Sept. 2015   Rh. Neck and Upper Back  . Arthritis Sept. 2015   Gout-Right Hand  Left knee  . Cancer (HCC)    8 wks Radiation  . Chronic kidney disease   . Diabetes mellitus    . ED (erectile dysfunction)   . Hx of agent Orange exposure    [place] during that conflict  . Hyperlipidemia   . Hypertension   . PVD (peripheral vascular disease) ([place])   . PVT (paroxysmal ventricular tachycardia) ([place])   . S/P BKA (below knee amputation) [facility])     Past Surgical History:  Procedure Laterality Date  . AMPUTATION Right 2009   BKA  . PROSTATE SURGERY     8 wks radiation     Current Outpatient Medications  Medication Sig Dispense Refill  . allopurinol (ZYLOPRIM) 100 MG tablet     . apixaban (ELIQUIS) 5 MG TABS tablet Take 1 tablet (5 mg total) by mouth 2 (two) times daily. 180 tablet 1  . B Complex Vitamins (B COMPLEX PO) Take 1 tablet by mouth daily.     . Cholecalciferol (VITAMIN D-3) 5000 UNITS TABS Take 5,000 Units by mouth daily.     . CONTOUR NEXT TEST test strip TEST BLOOD SUGAR LEVELS QID    . ezetimibe (ZETIA) 10 MG tablet Take 5 mg by mouth daily.    Marland Kitchen guaiFENesin (MUCINEX) 600 MG 12 hr tablet Take 600 mg by mouth 2 (two) times daily.    . hydrochlorothiazide 25 MG tablet Take 25 mg by mouth daily.    Marland Kitchen HYDROcodone-acetaminophen (NORCO/VICODIN) 5-325 MG tablet Take 1 tablet  every 8 hours as needed for diabetic neuropathy pain    . losartan (COZAAR) 100 MG tablet     . NOVOLOG 100 UNIT/ML injection Inject into the skin continuous. Via insulin pump  1  . pravastatin (PRAVACHOL) 40 MG tablet Take 40 mg by mouth daily.    . VOLTAREN 1 % GEL      No current facility-administered medications for this visit.    Allergies:   Prednisone and Dulaglutide    Social History:  The patient  reports that he quit smoking about 43 years ago. His smoking use included cigarettes. He has a 2.50 pack-year smoking history. He has never used smokeless tobacco. He reports that he does not drink alcohol and does not use drugs.   Family History:  The patient's family history includes Dementia in his mother; Diabetes in his father; Peripheral vascular  disease in his father.    ROS:  Please see the history of present illness.   Otherwise, review of systems are positive for none.   All other systems are reviewed and negative.    PHYSICAL EXAM: VS:  BP (!) 152/77   Pulse 86   Ht 5\' 11"  (1.803 m)   Wt 272 lb (123.4 kg)   SpO2 94%   BMI 37.94 kg/m  , BMI Body mass index is 37.94 kg/m. GEN: Well nourished, well developed, in no acute distress HEENT: sclera anicteric Neck: no JVD, carotid bruits, or masses Cardiac: IRIR; no murmurs, rubs, or gallops, 2+ LE edema  Respiratory:  clear to auscultation bilaterally, normal work of breathing GI: soft, nontender, nondistended, + BS MS: no deformity or atrophy Skin: warm and dry, no rash Neuro:  Strength and sensation are intact Psych: euthymic mood, full affect   EKG:  EKG is ordered today. The ekg ordered today demonstrates atrial fibrillation with PVC, rate 86 bpm, chronic LAD, RBBB, and LAFB, no STE/D; no significant change from previous   Recent Labs: No results found for requested labs within last 8760 hours.    Lipid Panel    Component Value Date/Time   CHOL 156 05/24/2010 0134   TRIG 250 (H) 05/24/2010 0134   HDL 28 (L) 05/24/2010 0134   CHOLHDL 5.6 Ratio 05/24/2010 0134   VLDL 50 (H) 05/24/2010 0134   LDLCALC 78 05/24/2010 0134      Wt Readings from Last 3 Encounters:  04/17/20 272 lb (123.4 kg)  03/20/20 262 lb (118.8 kg)  09/29/18 262 lb (118.8 kg)      Other studies Reviewed: Additional studies/ records that were reviewed today include:   Echocardiogram 2016: - Left ventricle: The cavity size was normal. There was mild  concentric hypertrophy. Systolic function was normal. The  estimated ejection fraction was in the range of 60% to 65%. Wall  motion was normal; there were no regional wall motion  abnormalities. Doppler parameters are consistent with abnormal  left ventricular relaxation (grade 1 diastolic dysfunction).  - Aorta: Ascending  aortic diameter: 38 mm (S).  - Ascending aorta: The ascending aorta was mildly dilated.  - Left atrium: The atrium was mildly dilated.  - Right ventricle: The cavity size was normal. Wall thickness was  normal. Systolic function was normal.  - Tricuspid valve: There was mild regurgitation.  - Pulmonic valve: There was mild regurgitation.  - Inferior vena cava: The vessel was normal in size. The  respirophasic diameter changes were in the normal range (>= 50%),  consistent with normal central venous pressure.  ASSESSMENT AND PLAN:  1. Fatigue: this is his primary complaint today. Likely multifactorial, though unclear what roll Afib could be playing. It is unclear if he has paroxysmal afib or persistent/permanent atrial fibrillation as he has only had 2 EKGs in the past 5 years, both of which were Afib, with NSR in 2016. He is generally unaware of his atrial fibrillation.  - Will update an echocardiogram to evaluate LV function, wall motion, and valvular function - Will check a 1 week cardiac monitor to evaluate afib burden - suspect he has permanent atrial fibrillation at this point.   2. Paroxysmal atrial fibrillation: EKG with atrial fibrillation with rate 86 bpm today. Not on any AV nodal blocking agents. We discussed starting metoprolol today for rate control/ improved BP, though he was very hesitant.  - Continue eliquis for stroke ppx  3. HTN: BP 152/77 today. Patient states BP is elevated at office visits. He is hesitant to make any changes today - Asked patient to monitor BP at home and notify the office if persistently >130/80. Would consider addition of BBlocker if elevated.  - Continue HCTZ and losartan - Encouraged low salt diet  4. HLD: No recent lipids on file - Continue pravastatin and zetia  5. PAD: follows with vascular surgery. LEA's/ABI's 03/2020 showed stable infrainguinal arterial occlusive disease  - Continue routine follow-up per vascular surgery  6. DM  type 2: reports A1C 8.1 3-4 months ago - Continue insulin per PCP    Current medicines are reviewed at length with the patient today.  The patient does not have concerns regarding medicines.  The following changes have been made:  As above  Labs/ tests ordered today include:   Orders Placed This Encounter  Procedures  . LONG TERM MONITOR (3-14 DAYS)  . EKG 12-Lead  . ECHOCARDIOGRAM COMPLETE     Disposition:   FU with myself or Dr. Sallyanne Kuster in 1 month  Signed, Abigail Butts, PA-C  04/17/2020 5:15 PM

## 2020-04-17 ENCOUNTER — Encounter: Payer: Self-pay | Admitting: Medical

## 2020-04-17 ENCOUNTER — Encounter: Payer: Self-pay | Admitting: *Deleted

## 2020-04-17 ENCOUNTER — Ambulatory Visit (INDEPENDENT_AMBULATORY_CARE_PROVIDER_SITE_OTHER): Payer: Medicare Other

## 2020-04-17 ENCOUNTER — Other Ambulatory Visit: Payer: Self-pay

## 2020-04-17 ENCOUNTER — Other Ambulatory Visit: Payer: Self-pay | Admitting: *Deleted

## 2020-04-17 ENCOUNTER — Ambulatory Visit (INDEPENDENT_AMBULATORY_CARE_PROVIDER_SITE_OTHER): Payer: Medicare Other | Admitting: Medical

## 2020-04-17 VITALS — BP 152/77 | HR 86 | Ht 71.0 in | Wt 272.0 lb

## 2020-04-17 DIAGNOSIS — I480 Paroxysmal atrial fibrillation: Secondary | ICD-10-CM

## 2020-04-17 DIAGNOSIS — E119 Type 2 diabetes mellitus without complications: Secondary | ICD-10-CM

## 2020-04-17 DIAGNOSIS — I10 Essential (primary) hypertension: Secondary | ICD-10-CM | POA: Diagnosis not present

## 2020-04-17 DIAGNOSIS — I739 Peripheral vascular disease, unspecified: Secondary | ICD-10-CM

## 2020-04-17 DIAGNOSIS — I779 Disorder of arteries and arterioles, unspecified: Secondary | ICD-10-CM

## 2020-04-17 DIAGNOSIS — E785 Hyperlipidemia, unspecified: Secondary | ICD-10-CM

## 2020-04-17 DIAGNOSIS — R5383 Other fatigue: Secondary | ICD-10-CM | POA: Diagnosis not present

## 2020-04-17 NOTE — Progress Notes (Signed)
Patient ID: Patrick Shelton, male   DOB: 06-02-1940, 80 y.o.   MRN: [id number] Patient enrolled for [employer] to ship a 7 day ZIO XT long term holter monitor to his home.

## 2020-04-17 NOTE — Patient Instructions (Addendum)
Medication Instructions:  Your physician recommends that you continue on your current medications as directed. Please refer to the Current Medication list given to you today.  *If you need a refill on your cardiac medications before your next appointment, please call your pharmacy*  Lab Work: NONE ordered at this time of appointment   If you have labs (blood work) drawn today and your tests are completely normal, you will receive your results only by: Marland Kitchen MyChart Message (if you have MyChart) OR . A paper copy in the mail If you have any lab test that is abnormal or we need to change your treatment, we will call you to review the results.  Testing/Procedures: Your physician has requested that you have an echocardiogram. Echocardiography is a painless test that uses sound waves to create images of your heart. It provides your doctor with information about the size and shape of your heart and how well your heart's chambers and valves are working. This procedure takes approximately one hour. There are no restrictions for this procedure. This test is performed at [place], [place], [place].    Please schedule for 2-3 weeks   ZIO XT- Long Term Monitor Instructions   Your physician has requested you wear your ZIO patch monitor 7 days.   This is a single patch monitor.  [employer] supplies one patch monitor per enrollment.  Additional stickers are not available.   Please do not apply patch if you will be having a Nuclear Stress Test, Echocardiogram, Cardiac CT, MRI, or Chest Xray during the time frame you would be wearing the monitor. The patch cannot be worn during these tests.  You cannot remove and re-apply the ZIO XT patch monitor.   Your ZIO patch monitor will be sent USPS Priority mail from [facility] directly to your home address. The monitor may also be mailed to a PO BOX if home delivery is not available.   It may take 3-5 days to receive your monitor after you have  been enrolled.   Once you have received you monitor, please review enclosed instructions.  Your monitor has already been registered assigning a specific monitor serial # to you.   Applying the monitor   Shave hair from upper left chest.   Hold abrader disc by orange tab.  Rub abrader in 40 strokes over left upper chest as indicated in your monitor instructions.   Clean area with 4 enclosed alcohol pads .  Use all pads to assure are is cleaned thoroughly.  Let dry.   Apply patch as indicated in monitor instructions.  Patch will be place under collarbone on left side of chest with arrow pointing upward.   Rub patch adhesive wings for 2 minutes.Remove white label marked "1".  Remove white label marked "2".  Rub patch adhesive wings for 2 additional minutes.   While looking in a mirror, press and release button in center of patch.  A small green light will flash 3-4 times .  This will be your only indicator the monitor has been turned on.     Do not shower for the first 24 hours.  You may shower after the first 24 hours.   Press button if you feel a symptom. You will hear a small click.  Record Date, Time and Symptom in the Patient Log Book.   When you are ready to remove patch, follow instructions on last 2 pages of Patient Log Book.  Stick patch monitor onto last page  of Patient Log Book.   Place Patient Log Book in [place] box.  Use locking tab on box and tape box closed securely.  The [employer] and [employer] has [employer] on it.  Please place in mailbox as soon as possible.  Your physician should have your test results approximately 7 days after the monitor has been mailed back to [facility].   Call [place] at [phone] if you have questions regarding your ZIO XT patch monitor.  Call them immediately if you see an orange light blinking on your monitor.   If your monitor falls off in less than 4 days contact our Monitor department at [phone].  If your  monitor becomes loose or falls off after 4 days call [employer] at [phone] for suggestions on securing your monitor.   Follow-Up: At [facility], you and your health needs are our priority.  As part of our continuing mission to provide you with exceptional heart care, we have created designated Provider Care Teams.  These Care Teams include your primary Cardiologist (physician) and Advanced Practice Providers (APPs -  Physician Assistants and Nurse Practitioners) who all work together to provide you with the care you need, when you need it.  We recommend signing up for the patient portal called "MyChart".  Sign up information is provided on this After Visit Summary.  MyChart is used to connect with patients for Virtual Visits (Telemedicine).  Patients are able to view lab/test results, encounter notes, upcoming appointments, etc.  Non-urgent messages can be sent to your provider as well.   To learn more about what you can do with MyChart, go to [email or web address].    Your next appointment:   1 month(s)  The format for your next appointment:   In Person  Provider:   Roby Lofts, PA-C  Other Instructions    Low-Sodium Eating Plan Sodium, which is an element that makes up salt, helps you maintain a healthy balance of fluids in your body. Too much sodium can increase your blood pressure and cause fluid and waste to be held in your body. Your health care provider or dietitian may recommend following this plan if you have high blood pressure (hypertension), kidney disease, liver disease, or heart failure. Eating less sodium can help lower your blood pressure, reduce swelling, and protect your heart, liver, and kidneys. What are tips for following this plan? Reading food labels  The Nutrition Facts label lists the amount of sodium in one serving of the food. If you eat more than one serving, you must multiply the listed amount of sodium by the number of servings.  Choose foods  with less than 140 mg of sodium per serving.  Avoid foods with 300 mg of sodium or more per serving. Shopping  Look for lower-sodium products, often labeled as "low-sodium" or "no salt added."  Always check the sodium content, even if foods are labeled as "unsalted" or "no salt added."  Buy fresh foods. ? Avoid canned foods and pre-made or frozen meals. ? Avoid canned, cured, or processed meats.  Buy breads that have less than 80 mg of sodium per slice.   Cooking  Eat more home-cooked food and less restaurant, buffet, and fast food.  Avoid adding salt when cooking. Use salt-free seasonings or herbs instead of table salt or sea salt. Check with your health care provider or pharmacist before using salt substitutes.  Cook with plant-based oils, such as canola, sunflower, or olive oil.   Meal planning  When  eating at a restaurant, ask that your food be prepared with less salt or no salt, if possible. Avoid dishes labeled as brined, pickled, cured, smoked, or made with soy sauce, miso, or teriyaki sauce.  Avoid foods that contain MSG (monosodium glutamate). MSG is sometimes added to [place] food, bouillon, and some canned foods.  Make meals that can be grilled, baked, poached, roasted, or steamed. These are generally made with less sodium. General information Most people on this plan should limit their sodium intake to 1,500-2,000 mg (milligrams) of sodium each day. What foods should I eat? Fruits Fresh, frozen, or canned fruit. Fruit juice. Vegetables Fresh or frozen vegetables. "No salt added" canned vegetables. "No salt added" tomato sauce and paste. Low-sodium or reduced-sodium tomato and vegetable juice. Grains Low-sodium cereals, including oats, puffed wheat and rice, and shredded wheat. Low-sodium crackers. Unsalted rice. Unsalted pasta. Low-sodium bread. Whole-grain breads and whole-grain pasta. Meats and other proteins Fresh or frozen (no salt added) meat, poultry, seafood,  and fish. Low-sodium canned tuna and salmon. Unsalted nuts. Dried peas, beans, and lentils without added salt. Unsalted canned beans. Eggs. Unsalted nut butters. Dairy Milk. Soy milk. Cheese that is naturally low in sodium, such as ricotta cheese, fresh mozzarella, or Swiss cheese. Low-sodium or reduced-sodium cheese. Cream cheese. Yogurt. Seasonings and condiments Fresh and dried herbs and spices. Salt-free seasonings. Low-sodium mustard and ketchup. Sodium-free salad dressing. Sodium-free light mayonnaise. Fresh or refrigerated horseradish. Lemon juice. Vinegar. Other foods Homemade, reduced-sodium, or low-sodium soups. Unsalted popcorn and pretzels. Low-salt or salt-free chips. The items listed above may not be a complete list of foods and beverages you can eat. Contact a dietitian for more information. What foods should I avoid? Vegetables Sauerkraut, pickled vegetables, and relishes. Olives. [place] fries. Onion rings. Regular canned vegetables (not low-sodium or reduced-sodium). Regular canned tomato sauce and paste (not low-sodium or reduced-sodium). Regular tomato and vegetable juice (not low-sodium or reduced-sodium). Frozen vegetables in sauces. Grains Instant hot cereals. Bread stuffing, pancake, and biscuit mixes. Croutons. Seasoned rice or pasta mixes. Noodle soup cups. Boxed or frozen macaroni and cheese. Regular salted crackers. Self-rising flour. Meats and other proteins Meat or fish that is salted, canned, smoked, spiced, or pickled. Precooked or cured meat, such as sausages or meat loaves. Berniece Salines. Ham. Pepperoni. Hot dogs. Corned beef. Chipped beef. Salt pork. Jerky. Pickled herring. Anchovies and sardines. Regular canned tuna. Salted nuts. Dairy Processed cheese and cheese spreads. Hard cheeses. Cheese curds. Blue cheese. Feta cheese. String cheese. Regular cottage cheese. Buttermilk. Canned milk. Fats and oils Salted butter. Regular margarine. Ghee. Bacon fat. Seasonings and  condiments Onion salt, garlic salt, seasoned salt, table salt, and sea salt. Canned and packaged gravies. Worcestershire sauce. Tartar sauce. Barbecue sauce. Teriyaki sauce. Soy sauce, including reduced-sodium. Steak sauce. Fish sauce. Oyster sauce. Cocktail sauce. Horseradish that you find on the shelf. Regular ketchup and mustard. Meat flavorings and tenderizers. Bouillon cubes. Hot sauce. Pre-made or packaged marinades. Pre-made or packaged taco seasonings. Relishes. Regular salad dressings. Salsa. Other foods Salted popcorn and pretzels. Corn chips and puffs. Potato and tortilla chips. Canned or dried soups. Pizza. Frozen entrees and pot pies. The items listed above may not be a complete list of foods and beverages you should avoid. Contact a dietitian for more information. Summary  Eating less sodium can help lower your blood pressure, reduce swelling, and protect your heart, liver, and kidneys.  Most people on this plan should limit their sodium intake to 1,500-2,000 mg (milligrams) of sodium each day.  Canned, boxed, and frozen foods are high in sodium. Restaurant foods, fast foods, and pizza are also very high in sodium. You also get sodium by adding salt to food.  Try to cook at home, eat more fresh fruits and vegetables, and eat less fast food and canned, processed, or prepared foods. This information is not intended to replace advice given to you by your health care provider. Make sure you discuss any questions you have with your health care provider. Document Revised: 03/31/2019 Document Reviewed: 01/25/2019 Elsevier Patient Education  2021 [employer].

## 2020-04-24 DIAGNOSIS — N1831 Chronic kidney disease, stage 3a: Secondary | ICD-10-CM | POA: Diagnosis not present

## 2020-04-24 DIAGNOSIS — E1165 Type 2 diabetes mellitus with hyperglycemia: Secondary | ICD-10-CM | POA: Diagnosis not present

## 2020-04-24 DIAGNOSIS — E782 Mixed hyperlipidemia: Secondary | ICD-10-CM | POA: Diagnosis not present

## 2020-04-24 DIAGNOSIS — Z794 Long term (current) use of insulin: Secondary | ICD-10-CM | POA: Diagnosis not present

## 2020-04-24 DIAGNOSIS — I10 Essential (primary) hypertension: Secondary | ICD-10-CM | POA: Diagnosis not present

## 2020-04-25 DIAGNOSIS — L602 Onychogryphosis: Secondary | ICD-10-CM | POA: Diagnosis not present

## 2020-04-25 DIAGNOSIS — E1351 Other specified diabetes mellitus with diabetic peripheral angiopathy without gangrene: Secondary | ICD-10-CM | POA: Diagnosis not present

## 2020-04-26 DIAGNOSIS — E113593 Type 2 diabetes mellitus with proliferative diabetic retinopathy without macular edema, bilateral: Secondary | ICD-10-CM | POA: Diagnosis not present

## 2020-04-26 DIAGNOSIS — E1142 Type 2 diabetes mellitus with diabetic polyneuropathy: Secondary | ICD-10-CM | POA: Diagnosis not present

## 2020-04-26 DIAGNOSIS — E1165 Type 2 diabetes mellitus with hyperglycemia: Secondary | ICD-10-CM | POA: Diagnosis not present

## 2020-04-26 DIAGNOSIS — I129 Hypertensive chronic kidney disease with stage 1 through stage 4 chronic kidney disease, or unspecified chronic kidney disease: Secondary | ICD-10-CM | POA: Diagnosis not present

## 2020-04-26 DIAGNOSIS — E1122 Type 2 diabetes mellitus with diabetic chronic kidney disease: Secondary | ICD-10-CM | POA: Diagnosis not present

## 2020-04-26 DIAGNOSIS — Z794 Long term (current) use of insulin: Secondary | ICD-10-CM | POA: Diagnosis not present

## 2020-04-26 DIAGNOSIS — E782 Mixed hyperlipidemia: Secondary | ICD-10-CM | POA: Diagnosis not present

## 2020-04-26 DIAGNOSIS — Z9641 Presence of insulin pump (external) (internal): Secondary | ICD-10-CM | POA: Diagnosis not present

## 2020-04-26 DIAGNOSIS — I739 Peripheral vascular disease, unspecified: Secondary | ICD-10-CM | POA: Diagnosis not present

## 2020-04-26 DIAGNOSIS — R2689 Other abnormalities of gait and mobility: Secondary | ICD-10-CM | POA: Diagnosis not present

## 2020-04-26 DIAGNOSIS — N1831 Chronic kidney disease, stage 3a: Secondary | ICD-10-CM | POA: Diagnosis not present

## 2020-04-26 DIAGNOSIS — Z978 Presence of other specified devices: Secondary | ICD-10-CM | POA: Diagnosis not present

## 2020-05-08 DIAGNOSIS — I480 Paroxysmal atrial fibrillation: Secondary | ICD-10-CM | POA: Diagnosis not present

## 2020-05-10 ENCOUNTER — Ambulatory Visit (HOSPITAL_COMMUNITY): Payer: Medicare Other | Attending: Cardiology

## 2020-05-10 ENCOUNTER — Other Ambulatory Visit: Payer: Self-pay

## 2020-05-10 DIAGNOSIS — I480 Paroxysmal atrial fibrillation: Secondary | ICD-10-CM | POA: Diagnosis not present

## 2020-05-10 LAB — ECHOCARDIOGRAM COMPLETE
Area-P 1/2: 3.1 cm2
S' Lateral: 3 cm

## 2020-05-10 NOTE — Progress Notes (Signed)
  Echocardiogram 2D Echocardiogram has been performed.  Patrick Shelton M     

## 2020-05-11 ENCOUNTER — Other Ambulatory Visit: Payer: Self-pay | Admitting: *Deleted

## 2020-05-11 DIAGNOSIS — I480 Paroxysmal atrial fibrillation: Secondary | ICD-10-CM

## 2020-05-11 DIAGNOSIS — R5383 Other fatigue: Secondary | ICD-10-CM

## 2020-05-15 ENCOUNTER — Ambulatory Visit (INDEPENDENT_AMBULATORY_CARE_PROVIDER_SITE_OTHER): Payer: Medicare Other | Admitting: Medical

## 2020-05-15 ENCOUNTER — Other Ambulatory Visit: Payer: Self-pay

## 2020-05-15 ENCOUNTER — Encounter: Payer: Self-pay | Admitting: Medical

## 2020-05-15 ENCOUNTER — Other Ambulatory Visit: Payer: Self-pay | Admitting: Medical

## 2020-05-15 VITALS — BP 159/74 | HR 73 | Ht 71.0 in | Wt 271.8 lb

## 2020-05-15 DIAGNOSIS — I10 Essential (primary) hypertension: Secondary | ICD-10-CM

## 2020-05-15 DIAGNOSIS — I739 Peripheral vascular disease, unspecified: Secondary | ICD-10-CM | POA: Diagnosis not present

## 2020-05-15 DIAGNOSIS — E119 Type 2 diabetes mellitus without complications: Secondary | ICD-10-CM | POA: Diagnosis not present

## 2020-05-15 DIAGNOSIS — I480 Paroxysmal atrial fibrillation: Secondary | ICD-10-CM

## 2020-05-15 DIAGNOSIS — R5383 Other fatigue: Secondary | ICD-10-CM

## 2020-05-15 DIAGNOSIS — E785 Hyperlipidemia, unspecified: Secondary | ICD-10-CM | POA: Diagnosis not present

## 2020-05-15 DIAGNOSIS — N183 Chronic kidney disease, stage 3 unspecified: Secondary | ICD-10-CM | POA: Diagnosis not present

## 2020-05-15 MED ORDER — AMLODIPINE BESYLATE 5 MG PO TABS: 5.0000 mg | ORAL_TABLET | Freq: Every day | ORAL | 0 refills | Status: DC

## 2020-05-15 MED ORDER — AMLODIPINE BESYLATE 5 MG PO TABS: 5.0000 mg | ORAL_TABLET | Freq: Every day | ORAL | 3 refills | Status: DC

## 2020-05-15 NOTE — Patient Instructions (Addendum)
Medication Instructions:   START Amlodipine 5 mg daily  *If you need a refill on your cardiac medications before your next appointment, please call your pharmacy*  Lab Work: NONE ordered at this time of appointment   If you have labs (blood work) drawn today and your tests are completely normal, you will receive your results only by: Marland Kitchen MyChart Message (if you have MyChart) OR . A paper copy in the mail If you have any lab test that is abnormal or we need to change your treatment, we will call you to review the results.  Testing/Procedures: NONE ordered at this time of appointment   Follow-Up: At [facility], you and your health needs are our priority.  As part of our continuing mission to provide you with exceptional heart care, we have created designated Provider Care Teams.  These Care Teams include your primary Cardiologist (physician) and Advanced Practice Providers (APPs -  Physician Assistants and Nurse Practitioners) who all work together to provide you with the care you need, when you need it.  We recommend signing up for the patient portal called "MyChart".  Sign up information is provided on this After Visit Summary.  MyChart is used to connect with patients for Virtual Visits (Telemedicine).  Patients are able to view lab/test results, encounter notes, upcoming appointments, etc.  Non-urgent messages can be sent to your provider as well.   To learn more about what you can do with MyChart, go to [email or web address].    Your next appointment:   2-3 month(s)  The format for your next appointment:   In Person  Provider:   Sanda Klein, MD  Other Instructions

## 2020-05-15 NOTE — Progress Notes (Addendum)
Cardiology Office Note   Date:  05/15/2020   ID:  Patrick Shelton, DOB 05/29/1940, MRN [id number]  PCP:  Lorne Skeens, MD  Cardiologist:  Sanda Klein, MD EP: None  Chief Complaint  Patient presents with  . Follow-up    fatigue      History of Present Illness: Patrick Shelton is a 80 y.o. male with a PMH of PAD s/p right BKA, paroxysmal atrial fibrillation, HTN, HLD, DM type 2, PTSD, CKD stage 3, and history of PE who presents for 1 month follow-up.  He was last evaluated by cardiology at an outpatient visit with myself 04/17/20, at which time he had complaints of fatigue. He was recommended to undergo an echocardiogram which occurred 05/10/20 showing EF 55-60%, no RWMA, mild LVH, G1DD, and no significant valvular abnormalities.   He presents today for 1 month follow-up. He continues to have fatigue which he attributes to his longstanding diabetes, insomnia, and deconditioning. He reports difficulty sleeping due to nocturia and bad dreams which seem to be worse since onset of war given his service in [place]. He recently saw his vascular doctor for his PAD and reports stable LE edema. His DOE is unchanged. He has no complaints of chest pain, palpitations, dizziness, lightheadedness, syncope, recent falls, or bleeding. He reports times have been stressful recently as his daughter in law was recently diagnosed with MS and he and his wife have been helping with their 69 and 15 yo grandchildren. He did have recent good news that his oldest granddaughter was recently accepted into med school.   Past Medical History:  Diagnosis Date  . Arthritis Sept. 2015   Rh. Neck and Upper Back  . Arthritis Sept. 2015   Gout-Right Hand  Left knee  . Cancer (HCC)    8 wks Radiation  . Chronic kidney disease   . Diabetes mellitus   . ED (erectile dysfunction)   . Hx of agent Orange exposure    while [place] in [place] during that conflict  . Hyperlipidemia   . Hypertension   . PVD (peripheral  vascular disease) ([place])   . PVT (paroxysmal ventricular tachycardia) ([place])   . S/P BKA (below knee amputation) [facility])     Past Surgical History:  Procedure Laterality Date  . AMPUTATION Right 2009   BKA  . PROSTATE SURGERY     8 wks radiation     Current Outpatient Medications  Medication Sig Dispense Refill  . allopurinol (ZYLOPRIM) 100 MG tablet     . apixaban (ELIQUIS) 5 MG TABS tablet Take 1 tablet (5 mg total) by mouth 2 (two) times daily. 180 tablet 1  . B Complex Vitamins (B COMPLEX PO) Take 1 tablet by mouth daily.     . Cholecalciferol (VITAMIN D-3) 5000 UNITS TABS Take 5,000 Units by mouth daily.     . CONTOUR NEXT TEST test strip TEST BLOOD SUGAR LEVELS QID    . ezetimibe (ZETIA) 10 MG tablet Take 5 mg by mouth daily.    Marland Kitchen guaiFENesin (MUCINEX) 600 MG 12 hr tablet Take 600 mg by mouth 2 (two) times daily.    . hydrochlorothiazide 25 MG tablet Take 25 mg by mouth daily.    Marland Kitchen HYDROcodone-acetaminophen (NORCO/VICODIN) 5-325 MG tablet Take 1 tablet every 8 hours as needed for diabetic neuropathy pain    . losartan (COZAAR) 100 MG tablet     . NOVOLOG 100 UNIT/ML injection Inject into the skin continuous. Via insulin pump  1  . pravastatin (  PRAVACHOL) 40 MG tablet Take 40 mg by mouth daily.    . VOLTAREN 1 % GEL     . amLODipine (NORVASC) 5 MG tablet Take 1 tablet (5 mg total) by mouth daily. 90 tablet 3   No current facility-administered medications for this visit.    Allergies:   Prednisone and Dulaglutide    Social History:  The patient  reports that he quit smoking about 43 years ago. His smoking use included cigarettes. He has a 2.50 pack-year smoking history. He has never used smokeless tobacco. He reports that he does not drink alcohol and does not use drugs.   Family History:  The patient's family history includes Dementia in his mother; Diabetes in his father; Peripheral vascular disease in his father.    ROS:  Please see the history of present illness.    Otherwise, review of systems are positive for none.   All other systems are reviewed and negative.    PHYSICAL EXAM: VS:  BP (!) 159/74   Pulse 73   Ht 5\' 11"  (1.803 m)   Wt 271 lb 12.8 oz (123.3 kg)   SpO2 95%   BMI 37.91 kg/m  , BMI Body mass index is 37.91 kg/m. GEN: Well nourished, well developed, in no acute distress HEENT: sclera anicteric  Neck: no JVD, carotid bruits, or masses Cardiac: RRR; no murmurs, rubs, or gallops, 2+ edema  Respiratory:  clear to auscultation bilaterally, normal work of breathing GI: soft, nontender, nondistended, + BS MS: no deformity or atrophy Skin: warm and dry, no rash Neuro:  Strength and sensation are intact Psych: euthymic mood, full affect   EKG:  EKG is ordered today. EKG showed sinus rhythm with 1st degree AV block with PVC and PAC, rate 73 bpm, RBBB, no STE/D, no significant change from previous.    Recent Labs: No results found for requested labs within last 8760 hours.    Lipid Panel    Component Value Date/Time   CHOL 156 05/24/2010 0134   TRIG 250 (H) 05/24/2010 0134   HDL 28 (L) 05/24/2010 0134   CHOLHDL 5.6 Ratio 05/24/2010 0134   VLDL 50 (H) 05/24/2010 0134   LDLCALC 78 05/24/2010 0134      Wt Readings from Last 3 Encounters:  05/15/20 271 lb 12.8 oz (123.3 kg)  04/17/20 272 lb (123.4 kg)  03/20/20 262 lb (118.8 kg)      Other studies Reviewed: Additional studies/ records that were reviewed today include:   Echocardiogram 05/2020: 1. Left ventricular ejection fraction, by estimation, is 55 to 60%. The  left ventricle has normal function. The left ventricle has no regional  wall motion abnormalities. There is mild left ventricular hypertrophy.  Left ventricular diastolic parameters  are consistent with Grade I diastolic dysfunction (impaired relaxation).  2. Right ventricular systolic function is normal. The right ventricular  size is normal. Tricuspid regurgitation signal is inadequate for assessing  PA  pressure.  3. The mitral valve is normal in structure. No evidence of mitral valve  regurgitation. No evidence of mitral stenosis.  4. The aortic valve was not well visualized. Aortic valve regurgitation  is not visualized. Mild to moderate aortic valve sclerosis/calcification  is present, without any evidence of aortic stenosis.  Long term monitor 04/2020:   The dominant rhythm is normal sinus rhythm with normal circadian variation.  There are occasional isolated premature ventricular beats (roughly 5%) with rare couplets and triplets and a single 9 beat run of nonsustained ventricular tachycardia.  There are occasional premature atrial beats with relatively frequent but very brief episodes of nonsustained atrial tachycardia (maximum 15 beats). There is no true atrial fibrillation.  No meaningful bradycardia was recorded. There were no significant pauses.   Abnormal arrhythmia monitor due to relatively frequent PVCs and PACs as well as frequent but very brief episodes of nonsustained paroxysmal atrial tachycardia and a single 9 beat run of nonsustained ventricular tachycardia.  There is no evidence of atrial fibrillation.     ASSESSMENT AND PLAN:   1. Fatigue: Likely multifactorial. Recent echo and cardiac monitor reassuring. He reports he had recent blood work and will fax copies to our office to review.  - Recommend CBC and TSH to evaluate anemia and thyroid function. He was given lab orders if these were not completed with recent blood work   2. Paroxysmal atrial fibrillation: Recent cardiac monitor without atrial fibrillation though did have frequent PVC's (5% burden) per Dr. Victorino December report though felt to not be contributing to his fatigue.. Not on any AV nodal blocking agents. Worry addition of BBlocker may worsen fatigue - Continue eliquis for stroke ppx  3. HTN: BP 159/74 today, consistent with last visit. He has not been monitoring his BP at home. Hesitant to start  BBlocker due to fatigue and want to limit nephrotoxic agents with CKD history. - Will start amlodipine 5mg  daily  - Continue HCTZ and losartan - Encouraged low salt diet  4. HLD: No recent lipids on file - Continue pravastatin and zetia  5. PAD: follows with vascular surgery. LEA's/ABI's 03/2020 showed stable infrainguinal arterial occlusive disease  - Continue routine follow-up per vascular surgery  6. DM type 2: reports A1C 8.1 3-4 months ago - Continue insulin per PCP   7. CKD stage 3: Cr 1.5 from 2020 in scanned labs - Patient to fax over recent blood work for review.      Current medicines are reviewed at length with the patient today.  The patient does not have concerns regarding medicines.  The following changes have been made:  As above  Labs/ tests ordered today include:   Orders Placed This Encounter  Procedures  . EKG 12-Lead     Disposition:   FU with Dr. Sallyanne Kuster in 2-3 months for follow-up of his blood pressure  Signed, Abigail Butts, PA-C  05/15/2020 5:23 PM

## 2020-05-16 NOTE — Telephone Encounter (Signed)
This is Dr. Victorino December pt. This medication was not sent to pt's pharmacy. Please resend Rx to pharmacy. Thanks

## 2020-05-17 NOTE — Progress Notes (Signed)
TYKK

## 2020-06-13 ENCOUNTER — Other Ambulatory Visit: Payer: Self-pay | Admitting: Medical

## 2020-06-13 DIAGNOSIS — I10 Essential (primary) hypertension: Secondary | ICD-10-CM

## 2020-07-02 DIAGNOSIS — M25552 Pain in left hip: Secondary | ICD-10-CM | POA: Diagnosis not present

## 2020-07-04 DIAGNOSIS — E1351 Other specified diabetes mellitus with diabetic peripheral angiopathy without gangrene: Secondary | ICD-10-CM | POA: Diagnosis not present

## 2020-07-04 DIAGNOSIS — I739 Peripheral vascular disease, unspecified: Secondary | ICD-10-CM | POA: Diagnosis not present

## 2020-07-04 DIAGNOSIS — M205X2 Other deformities of toe(s) (acquired), left foot: Secondary | ICD-10-CM | POA: Diagnosis not present

## 2020-07-04 DIAGNOSIS — L602 Onychogryphosis: Secondary | ICD-10-CM | POA: Diagnosis not present

## 2020-07-04 DIAGNOSIS — L97521 Non-pressure chronic ulcer of other part of left foot limited to breakdown of skin: Secondary | ICD-10-CM | POA: Diagnosis not present

## 2020-07-11 DIAGNOSIS — S80822A Blister (nonthermal), left lower leg, initial encounter: Secondary | ICD-10-CM | POA: Diagnosis not present

## 2020-07-11 DIAGNOSIS — I70245 Atherosclerosis of native arteries of left leg with ulceration of other part of foot: Secondary | ICD-10-CM | POA: Diagnosis not present

## 2020-07-11 DIAGNOSIS — L97521 Non-pressure chronic ulcer of other part of left foot limited to breakdown of skin: Secondary | ICD-10-CM | POA: Diagnosis not present

## 2020-07-11 DIAGNOSIS — E1351 Other specified diabetes mellitus with diabetic peripheral angiopathy without gangrene: Secondary | ICD-10-CM | POA: Diagnosis not present

## 2020-07-11 DIAGNOSIS — L97529 Non-pressure chronic ulcer of other part of left foot with unspecified severity: Secondary | ICD-10-CM | POA: Diagnosis not present

## 2020-07-11 DIAGNOSIS — I70202 Unspecified atherosclerosis of native arteries of extremities, left leg: Secondary | ICD-10-CM | POA: Diagnosis not present

## 2020-07-11 DIAGNOSIS — I739 Peripheral vascular disease, unspecified: Secondary | ICD-10-CM | POA: Diagnosis not present

## 2020-07-23 DIAGNOSIS — Z794 Long term (current) use of insulin: Secondary | ICD-10-CM | POA: Diagnosis not present

## 2020-07-23 DIAGNOSIS — E782 Mixed hyperlipidemia: Secondary | ICD-10-CM | POA: Diagnosis not present

## 2020-07-23 DIAGNOSIS — E1165 Type 2 diabetes mellitus with hyperglycemia: Secondary | ICD-10-CM | POA: Diagnosis not present

## 2020-07-23 DIAGNOSIS — I10 Essential (primary) hypertension: Secondary | ICD-10-CM | POA: Diagnosis not present

## 2020-07-23 DIAGNOSIS — N1831 Chronic kidney disease, stage 3a: Secondary | ICD-10-CM | POA: Diagnosis not present

## 2020-07-25 DIAGNOSIS — I739 Peripheral vascular disease, unspecified: Secondary | ICD-10-CM | POA: Diagnosis not present

## 2020-07-25 DIAGNOSIS — E1351 Other specified diabetes mellitus with diabetic peripheral angiopathy without gangrene: Secondary | ICD-10-CM | POA: Diagnosis not present

## 2020-07-25 DIAGNOSIS — L97521 Non-pressure chronic ulcer of other part of left foot limited to breakdown of skin: Secondary | ICD-10-CM | POA: Diagnosis not present

## 2020-07-25 DIAGNOSIS — S80822D Blister (nonthermal), left lower leg, subsequent encounter: Secondary | ICD-10-CM | POA: Diagnosis not present

## 2020-07-26 DIAGNOSIS — Z9641 Presence of insulin pump (external) (internal): Secondary | ICD-10-CM | POA: Diagnosis not present

## 2020-07-26 DIAGNOSIS — I129 Hypertensive chronic kidney disease with stage 1 through stage 4 chronic kidney disease, or unspecified chronic kidney disease: Secondary | ICD-10-CM | POA: Diagnosis not present

## 2020-07-26 DIAGNOSIS — N1831 Chronic kidney disease, stage 3a: Secondary | ICD-10-CM | POA: Diagnosis not present

## 2020-07-26 DIAGNOSIS — E113593 Type 2 diabetes mellitus with proliferative diabetic retinopathy without macular edema, bilateral: Secondary | ICD-10-CM | POA: Diagnosis not present

## 2020-07-26 DIAGNOSIS — E782 Mixed hyperlipidemia: Secondary | ICD-10-CM | POA: Diagnosis not present

## 2020-07-26 DIAGNOSIS — E669 Obesity, unspecified: Secondary | ICD-10-CM | POA: Diagnosis not present

## 2020-07-26 DIAGNOSIS — Z6838 Body mass index (BMI) 38.0-38.9, adult: Secondary | ICD-10-CM | POA: Diagnosis not present

## 2020-07-26 DIAGNOSIS — E1122 Type 2 diabetes mellitus with diabetic chronic kidney disease: Secondary | ICD-10-CM | POA: Diagnosis not present

## 2020-07-26 DIAGNOSIS — Z978 Presence of other specified devices: Secondary | ICD-10-CM | POA: Diagnosis not present

## 2020-07-26 DIAGNOSIS — Z794 Long term (current) use of insulin: Secondary | ICD-10-CM | POA: Diagnosis not present

## 2020-07-26 DIAGNOSIS — E1165 Type 2 diabetes mellitus with hyperglycemia: Secondary | ICD-10-CM | POA: Diagnosis not present

## 2020-07-26 DIAGNOSIS — E1142 Type 2 diabetes mellitus with diabetic polyneuropathy: Secondary | ICD-10-CM | POA: Diagnosis not present

## 2020-07-30 ENCOUNTER — Ambulatory Visit: Payer: Medicare Other | Admitting: Cardiovascular Disease

## 2020-08-01 ENCOUNTER — Other Ambulatory Visit: Payer: Self-pay | Admitting: *Deleted

## 2020-08-01 DIAGNOSIS — I779 Disorder of arteries and arterioles, unspecified: Secondary | ICD-10-CM

## 2020-08-08 DIAGNOSIS — L97521 Non-pressure chronic ulcer of other part of left foot limited to breakdown of skin: Secondary | ICD-10-CM | POA: Diagnosis not present

## 2020-08-08 DIAGNOSIS — M205X2 Other deformities of toe(s) (acquired), left foot: Secondary | ICD-10-CM | POA: Diagnosis not present

## 2020-08-08 DIAGNOSIS — I739 Peripheral vascular disease, unspecified: Secondary | ICD-10-CM | POA: Diagnosis not present

## 2020-08-08 DIAGNOSIS — E1351 Other specified diabetes mellitus with diabetic peripheral angiopathy without gangrene: Secondary | ICD-10-CM | POA: Diagnosis not present

## 2020-08-14 ENCOUNTER — Other Ambulatory Visit: Payer: Self-pay

## 2020-08-14 ENCOUNTER — Ambulatory Visit (INDEPENDENT_AMBULATORY_CARE_PROVIDER_SITE_OTHER): Payer: Medicare Other | Admitting: Vascular Surgery

## 2020-08-14 ENCOUNTER — Encounter: Payer: Self-pay | Admitting: Vascular Surgery

## 2020-08-14 ENCOUNTER — Ambulatory Visit (HOSPITAL_COMMUNITY)
Admission: RE | Admit: 2020-08-14 | Discharge: 2020-08-14 | Disposition: A | Discharge status: Home / Self Care | Payer: Medicare Other | Source: Ambulatory Visit | Attending: Vascular Surgery | Admitting: Vascular Surgery

## 2020-08-14 VITALS — BP 158/82 | HR 89 | Temp 97.9°F | Resp 20 | Ht 71.0 in | Wt 271.0 lb

## 2020-08-14 DIAGNOSIS — I779 Disorder of arteries and arterioles, unspecified: Secondary | ICD-10-CM | POA: Diagnosis not present

## 2020-08-14 DIAGNOSIS — I872 Venous insufficiency (chronic) (peripheral): Secondary | ICD-10-CM | POA: Diagnosis not present

## 2020-08-14 DIAGNOSIS — Z89511 Acquired absence of right leg below knee: Secondary | ICD-10-CM | POA: Diagnosis not present

## 2020-08-14 NOTE — Progress Notes (Signed)
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  He has diabetes and dry gangrene of the fourth toe.  I have recommended that we proceed with arteriography to see what options we would have for revascularization. I have reviewed with the patient the indications for arteriography. In addition, I have reviewed the potential complications of arteriography including but not limited to: Bleeding, arterial injury, arterial thrombosis, dye action, renal insufficiency, or other unpredictable medical problems. I have explained to the patient that if we find disease amenable to angioplasty we could potentially address this at the same time. I have discussed the potential complications of angioplasty and stenting, including but not limited to: Bleeding, arterial thrombosis, arterial injury, dissection, or the need for surgical intervention.  Currently he is very reluctant to proceed with arteriography.  I have explained that given his peripheral vascular disease I do not think the wound on the fourth toe will heal and that this could become a limb threatening problem.  I explained that with diabetes sometimes the infection can develop rapidly.  Currently the toe is dry without erythema or drainage and therefore I did not think he had to make a decision today.  However, I encouraged him to make a decision as soon as possible so that we can potentially proceed with arteriography.  I have encouraged him to examine the toe daily and look for signs of infection including cellulitis or drainage.  He will call when he decides to proceed with arteriography.  If he does agree to proceed we will need to stop his Eliquis for 48 hours prior to the procedure.  In addition, pending his labs this study may need to be done with CO2 and limited contrast.   REASON FOR CONSULT:    Ulceration of the left lower  extremity and ulcer on the left fourth toe.  The consult is requested urgently by Dr. Fritzi Mandes  HPI:   Patrick Shelton is a 80 y.o. male who I saw in consultation on 03/20/2020 with peripheral vascular disease and chronic venous insufficiency.  He had evidence of infrainguinal arterial occlusive disease.  He was not a smoker.  He also had CEAP C4 venous disease.  We discussed the importance of intermittent leg elevation and using mild compression.  Of note he had previously been followed by Dr. Adele Barthel with peripheral vascular disease.  He has undergone a previous right below the knee amputation.  Of note his arterial Doppler study back in January on the left showed monophasic posterior tibial signal on the left and a biphasic dorsalis pedis signal.  The ABIs could not be obtained as the arteries were not compressible.  On my history, the patient developed a large blister on his left pretibial area that ultimately broke and he developed a superficial ulceration which is gradually healing.  More concerning he tells me that he had a wound on his left fourth toe for about 2 months.  He does not remember any specific injury to the toe.  He is ambulatory with his prosthesis.  I do not get any clear-cut history of left calf claudication.  He denies any history of rest pain.  He denies any fever or chills.  He is on Eliquis but states that he is on this because he has had a previous amputation.  I do not see any history of atrial fibrillation.  He does have a history  of a pulmonary embolus in the past.  He tells me that he had a clot in the right leg and was on Coumadin.  When they stopped the Coumadin he developed another clot.  Not sure if he had a hypercoagulable work-up.  Past Medical History:  Diagnosis Date  . Arthritis Sept. 2015   Rh. Neck and Upper Back  . Arthritis Sept. 2015   Gout-Right Hand  Left knee  . Cancer (HCC)    8 wks Radiation  . Chronic kidney disease   . Diabetes mellitus   . ED  (erectile dysfunction)   . Hx of agent Orange exposure    while [place] in [place] during that conflict  . Hyperlipidemia   . Hypertension   . PVD (peripheral vascular disease) ([place])   . PVT (paroxysmal ventricular tachycardia) ([place])   . S/P BKA (below knee amputation) (HCC)     Family History  Problem Relation Age of Onset  . Dementia Mother   . Diabetes Father        Amputation- Bilateral leg  . Peripheral vascular disease Father     SOCIAL HISTORY: Social History   Tobacco Use  . Smoking status: Former Smoker    Packs/day: 0.25    Years: 10.00    Pack years: 2.50    Types: Cigarettes    Quit date: 03/09/1977    Years since quitting: 43.4  . Smokeless tobacco: Never Used  Substance Use Topics  . Alcohol use: No    Alcohol/week: 0.0 standard drinks    Allergies  Allergen Reactions  . Prednisone Anaphylaxis and Shortness Of Breath  . Dulaglutide Nausea Only  . Atorvastatin   . Lovastatin     Other reaction(s): OTHER REACTION    Current Outpatient Medications  Medication Sig Dispense Refill  . allopurinol (ZYLOPRIM) 100 MG tablet     . amLODipine (NORVASC) 5 MG tablet TAKE 1 TABLET(5 MG) BY MOUTH DAILY 90 tablet 3  . apixaban (ELIQUIS) 5 MG TABS tablet Take 1 tablet (5 mg total) by mouth 2 (two) times daily. 180 tablet 1  . B Complex Vitamins (B COMPLEX PO) Take 1 tablet by mouth daily.     . Cholecalciferol (VITAMIN D-3) 5000 UNITS TABS Take 5,000 Units by mouth daily.     . CONTOUR NEXT TEST test strip TEST BLOOD SUGAR LEVELS QID    . ezetimibe (ZETIA) 10 MG tablet Take 5 mg by mouth daily.    Marland Kitchen guaiFENesin (MUCINEX) 600 MG 12 hr tablet Take 600 mg by mouth 2 (two) times daily.    . hydrochlorothiazide 25 MG tablet Take 25 mg by mouth daily.    Marland Kitchen HYDROcodone-acetaminophen (NORCO/VICODIN) 5-325 MG tablet Take 1 tablet every 8 hours as needed for diabetic neuropathy pain    . losartan (COZAAR) 100 MG tablet     . NOVOLOG 100 UNIT/ML injection Inject into the  skin continuous. Via insulin pump  1  . pravastatin (PRAVACHOL) 40 MG tablet Take 40 mg by mouth daily.    . VOLTAREN 1 % GEL      No current facility-administered medications for this visit.    REVIEW OF SYSTEMS:  [X]  denotes positive finding, [ ]  denotes negative finding Cardiac  Comments:  Chest pain or chest pressure:    Shortness of breath upon exertion:    Short of breath when lying flat:    Irregular heart rhythm:        Vascular    Pain in calf, thigh,  or hip brought on by ambulation:    Pain in feet at night that wakes you up from your sleep:     Blood clot in your veins:    Leg swelling:         Pulmonary    Oxygen at home:    Productive cough:     Wheezing:         Neurologic    Sudden weakness in arms or legs:     Sudden numbness in arms or legs:     Sudden onset of difficulty speaking or slurred speech:    Temporary loss of vision in one eye:     Problems with dizziness:         Gastrointestinal    Blood in stool:     Vomited blood:         Genitourinary    Burning when urinating:     Blood in urine:        Psychiatric    Major depression:         Hematologic    Bleeding problems:    Problems with blood clotting too easily:        Skin    Rashes or ulcers:        Constitutional    Fever or chills:    -  PHYSICAL EXAM:   Vitals:   08/14/20 1525  BP: (!) 158/82  Pulse: 89  Resp: 20  Temp: 97.9 F (36.6 C)  SpO2: 91%  Weight: 271 lb (122.9 kg)  Height: 5\' 11"  (1.803 m)   Body mass index is 37.8 kg/m.   GENERAL: The patient is a well-nourished male, in no acute distress. The vital signs are documented above. CARDIAC: There is a regular rate and rhythm.  VASCULAR: I do not detect carotid bruits. He has palpable femoral pulses. On the left side I cannot palpate a popliteal or pedal pulses.  He has monophasic Doppler signals in the left foot. PULMONARY: There is good air exchange bilaterally without wheezing or rales. ABDOMEN: Soft  and non-tender with normal pitched bowel sounds.  MUSCULOSKELETAL: He has a right below the knee amputation. NEUROLOGIC: No focal weakness or paresthesias are detected. SKIN: He has a wound on his left fourth toe as documented in the photograph below.    I am less concerned about the area where he had a blister which is now essentially healed.    PSYCHIATRIC: The patient has a normal affect.  DATA:    ARTERIAL DOPPLER STUDY: I have independently interpreted his arterial Doppler study today.  This was of the left leg only.  He has a dampened monophasic posterior tibial signal, dampened monophasic peroneal signal, and a monophasic dorsalis pedis signal.  The arteries are calcified and ABIs are not reliable.  His toe pressure was 66 mmHg.  I do not have any recent labs.  Most recent labs were 2018 in epic.  Deitra Mayo [facility]

## 2020-08-15 DIAGNOSIS — S80822D Blister (nonthermal), left lower leg, subsequent encounter: Secondary | ICD-10-CM | POA: Diagnosis not present

## 2020-08-15 DIAGNOSIS — E1351 Other specified diabetes mellitus with diabetic peripheral angiopathy without gangrene: Secondary | ICD-10-CM | POA: Diagnosis not present

## 2020-08-15 DIAGNOSIS — M205X2 Other deformities of toe(s) (acquired), left foot: Secondary | ICD-10-CM | POA: Diagnosis not present

## 2020-08-15 DIAGNOSIS — L97521 Non-pressure chronic ulcer of other part of left foot limited to breakdown of skin: Secondary | ICD-10-CM | POA: Diagnosis not present

## 2020-08-21 ENCOUNTER — Encounter (HOSPITAL_BASED_OUTPATIENT_CLINIC_OR_DEPARTMENT_OTHER): Payer: Medicare Other | Attending: Internal Medicine | Admitting: Physician Assistant

## 2020-08-21 ENCOUNTER — Other Ambulatory Visit: Payer: Self-pay

## 2020-08-21 DIAGNOSIS — L97522 Non-pressure chronic ulcer of other part of left foot with fat layer exposed: Secondary | ICD-10-CM | POA: Insufficient documentation

## 2020-08-21 DIAGNOSIS — Z87891 Personal history of nicotine dependence: Secondary | ICD-10-CM | POA: Diagnosis not present

## 2020-08-21 DIAGNOSIS — E1151 Type 2 diabetes mellitus with diabetic peripheral angiopathy without gangrene: Secondary | ICD-10-CM | POA: Insufficient documentation

## 2020-08-21 DIAGNOSIS — Z7901 Long term (current) use of anticoagulants: Secondary | ICD-10-CM | POA: Diagnosis not present

## 2020-08-21 DIAGNOSIS — E1122 Type 2 diabetes mellitus with diabetic chronic kidney disease: Secondary | ICD-10-CM | POA: Diagnosis not present

## 2020-08-21 DIAGNOSIS — N183 Chronic kidney disease, stage 3 unspecified: Secondary | ICD-10-CM | POA: Diagnosis not present

## 2020-08-21 DIAGNOSIS — I129 Hypertensive chronic kidney disease with stage 1 through stage 4 chronic kidney disease, or unspecified chronic kidney disease: Secondary | ICD-10-CM | POA: Diagnosis not present

## 2020-08-21 DIAGNOSIS — E11621 Type 2 diabetes mellitus with foot ulcer: Secondary | ICD-10-CM | POA: Insufficient documentation

## 2020-08-21 NOTE — Progress Notes (Signed)
ROCKIE, SCHNOOR ([id number]) Visit Report for 08/21/2020 Chief Complaint Document Details Patient Name: Date of Service: Patrick Shelton 08/21/2020 1:15 PM Medical Record Number: [id number] Patient Account Number: [id number] Date of Birth/Sex: Treating RN: June 19, 1940 (80 y.o. [name] Primary Care Provider: Altheimer, Juanda Bond Other Clinician: Referring Provider: Treating Provider/Extender: Worthy Keeler Altheimer, Docia Chuck in Treatment: 0 Information Obtained from: Patient Chief Complaint Left 4th toe ulcer Electronic Signature(s) Signed: 08/21/2020 2:13:21 PM By: Worthy Keeler PA-C Entered By: Worthy Keeler on 08/21/2020 14:13:21 -------------------------------------------------------------------------------- HPI Details Patient Name: Date of Service: Patrick Shelton 08/21/2020 1:15 PM Medical Record Number: [id number] Patient Account Number: [id number] Date of Birth/Sex: Treating RN: [birth year] (80 y.o. [name] Primary Care Provider: Altheimer, Juanda Bond Other Clinician: Referring Provider: Treating Provider/Extender: Worthy Keeler Altheimer, Docia Chuck in Treatment: 0 History of Present Illness HPI Description: 08/21/2020 upon evaluation today patient presents for initial inspection here in the clinic concerning issues that he has been having actually with the wound on his left leg. However upon evaluation by the time he got into see [place] this wound has healed and seems to be doing quite well. With that being said his main issue currently is actually a wound that is on his toe. This is in fact the left fourth toe. 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  Nonetheless he has been seen by podiatry. He has been seen specifically by an stride foot and ankle specialist. It appears that the provider is Dr. Rosemary Holms nonetheless currently she has trimmed his nails but has not done anything as far as debridement in regard to the toe and again I really think that is probably not in his best interest based on the arterial flow. He does see vein and vascular specialist as well and apparently he is not interested whatsoever in proceeding with an arteriogram simply due to the fact that he does have stage III kidney disease and is more concerned about conservative's kidneys instead of potentially risking losing kidney function and possibly still losing the toe or the leg in any way. Either way he tells me that he would "proceed with an amputation in regard to the toe or even leg before I will have a direct procedure to work on the blood flow". He states his kidneys are more important. He is on anticoagulant therapy long-term. This is Eliquis. He is seen with his wife during the office visit today. He does have a history of diabetes mellitus type 2 with a hemoglobin A1c of 8.1 most recent go I do not know the exact time all these records are in [place] which I do not have direct access to. He was a previous smoker but has not smoked for 40 years. Electronic Signature(s) Signed: 08/21/2020 2:59:18 PM By: Worthy Keeler PA-C Entered By: Worthy Keeler on 08/21/2020 14:59:18 -------------------------------------------------------------------------------- Physical Exam Details Patient Name: Date of Service: Patrick Shelton 08/21/2020 1:15 PM Medical Record Number: [id number] Patient Account Number: [id number] Date of Birth/Sex: Treating RN: 01/01/1941 (80 y.o. [name] Primary Care Provider: Altheimer, Juanda Bond Other Clinician: Referring Provider: Treating  Provider/Extender: Worthy Keeler Altheimer, Docia Chuck in Treatment:  0 Constitutional patient is hypertensive.. pulse regular and within target range for patient.Marland Kitchen respirations regular, non-labored and within target range for patient.Marland Kitchen temperature within target range for patient.. Well-nourished and well-hydrated in no acute distress. Eyes conjunctiva clear no eyelid edema noted. pupils equal round and reactive to light and accommodation. Ears, Nose, Mouth, and Throat no gross abnormality of ear auricles or external auditory canals. normal hearing noted during conversation. mucus membranes moist. Respiratory normal breathing without difficulty. Cardiovascular Absent posterior tibial and dorsalis pedis pulses bilateral lower extremities. no clubbing, cyanosis, significant edema, <3 sec cap refill. Musculoskeletal Patient unable to walk without assistance. Patient does ambulate with the use of a prosthesis for his right leg below-knee amputation.Marland Kitchen Psychiatric this patient is able to make decisions and demonstrates good insight into disease process. Alert and Oriented x 3. pleasant and cooperative. Notes Upon inspection patient's wound bed actually showed signs of fairly good granulation epithelization at this point. There does not appear to be any signs of active infection which is great news and overall I am pleased in that regard. 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 Obviously the goal is good to be trying to prevent this from developing into a wet gangrene which would obviously be more of a problem for him Is at risk of an infection spreading and amputation is concerned. Currently the been using Betadine topically which actually I think is probably a very good option considering all things. Electronic Signature(s) Signed: 08/21/2020 3:00:37 PM By: Worthy Keeler PA-C Entered By: Worthy Keeler on 08/21/2020  15:00:37 -------------------------------------------------------------------------------- Physician Orders Details Patient Name: Date of Service: Patrick Shelton 08/21/2020 1:15 PM Medical Record Number: [id number] Patient Account Number: [id number] Date of Birth/Sex: Treating RN: [birth year] (80 y.o. [name] Primary Care Provider: Altheimer, Juanda Bond Other Clinician: Referring Provider: Treating Provider/Extender: Worthy Keeler Altheimer, Docia Chuck in Treatment: 0 Verbal / Phone Orders: No Diagnosis Coding ICD-10 Coding Code Description [phone] Type 2 diabetes mellitus with foot ulcer L97.522 Non-pressure chronic ulcer of other part of left foot with fat layer exposed N18.30 Chronic kidney disease, stage 3 unspecified Z79.01 Long term (current) use of anticoagulants Follow-up Appointments Return Appointment in 2 weeks. Bathing/ Shower/ Hygiene May shower and wash wound with soap and water. Wound Treatment Wound #1 - T Fourth oe Wound Laterality: Left Topical: Povidone Iodine Swabstick 3 pack, 4(in) 1 x Per Day/30 Days Discharge Instructions: paint with betadine Secondary Dressing: Woven Gauze Sponges 2x2 in 1 x Per Day/30 Days Discharge Instructions: Apply over primary dressing as directed. Secured With: Child psychotherapist, Sterile 2x75 (in/in) 1 x Per Day/30 Days Discharge Instructions: Secure with stretch gauze as directed. Electronic Signature(s) Signed: 08/21/2020 6:34:46 PM By: Baruch Gouty RN, BSN Signed: 08/21/2020 6:55:53 PM By: Worthy Keeler PA-C Entered By: Baruch Gouty on 08/21/2020 14:28:08 -------------------------------------------------------------------------------- Problem List Details Patient Name: Date of Service: Patrick Shelton 08/21/2020 1:15 PM Medical Record Number: [id number] Patient Account Number: [id number] Date of Birth/Sex: Treating RN: 1941-02-12 (80 y.o. [name] Primary Care Provider:  Altheimer, Juanda Bond Other Clinician: Referring Provider: Treating Provider/Extender: Worthy Keeler Altheimer, Docia Chuck in Treatment: 0 Active Problems ICD-10 Encounter Code Description Active Date MDM Diagnosis E11.621 Type 2 diabetes mellitus with foot ulcer 08/21/2020 No Yes L97.522 Non-pressure chronic ulcer of other part  of left foot with fat layer exposed 08/21/2020 No Yes N18.30 Chronic kidney disease, stage 3 unspecified 08/21/2020 No Yes Z79.01 Long term (current) use of anticoagulants 08/21/2020 No Yes Inactive Problems Resolved Problems Electronic Signature(s) Signed: 08/21/2020 2:13:06 PM By: Worthy Keeler PA-C Entered By: Worthy Keeler on 08/21/2020 14:13:05 -------------------------------------------------------------------------------- Progress Note Details Patient Name: Date of Service: Patrick Shelton 08/21/2020 1:15 PM Medical Record Number: [id number] Patient Account Number: [id number] Date of Birth/Sex: Treating RN: [birth year] (80 y.o. [name] Primary Care Provider: Altheimer, Juanda Bond Other Clinician: Referring Provider: Treating Provider/Extender: Worthy Keeler Altheimer, Docia Chuck in Treatment: 0 Subjective Chief Complaint Information obtained from Patient Left 4th toe ulcer History of Present Illness (HPI) 08/21/2020 upon evaluation today patient presents for initial inspection here in the clinic concerning issues that he has been having actually with the wound on his left leg. However upon evaluation by the time he got into see [place] this wound has healed and seems to be doing quite well. With that being said his main issue currently is actually a wound that is on his toe. This is in fact the left fourth toe. 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 With that being said this is definitely an area that is very likely to  be amputated especially in light of the fact that he has a noncompressible ABI with a TBI of 0.34. Nonetheless he has been seen by podiatry. He has been seen specifically by an stride foot and ankle specialist. It appears that the provider is Dr. Rosemary Holms nonetheless currently she has trimmed his nails but has not done anything as far as debridement in regard to the toe and again I really think that is probably not in his best interest based on the arterial flow. He does see vein and vascular specialist as well and apparently he is not interested whatsoever in proceeding with an arteriogram simply due to the fact that he does have stage III kidney disease and is more concerned about conservative's kidneys instead of potentially risking losing kidney function and possibly still losing the toe or the leg in any way. Either way he tells me that he would "proceed with an amputation in regard to the toe or even leg before I will have a direct procedure to work on the blood flow". He states his kidneys are more important. He is on anticoagulant therapy long-term. This is Eliquis. He is seen with his wife during the office visit today. He does have a history of diabetes mellitus type 2 with a hemoglobin A1c of 8.1 most recent go I do not know the exact time all these records are in [place] which I do not have direct access to. He was a previous smoker but has not smoked for 40 years. Patient History Information obtained from Patient. Allergies prednisone (Reaction: Anaphylaxis), dulaglutide (Reaction: nausea) Family History Diabetes - Father, No family history of Cancer, Heart Disease, Hereditary Spherocytosis, Hypertension, Kidney Disease, Lung Disease, Seizures, Stroke, Thyroid Problems, Tuberculosis. Social History Former smoker, Marital Status - Married, Alcohol Use - Rarely, Drug Use - No History, Caffeine Use - Rarely. Medical History Eyes Patient has history of Cataracts -  removed Cardiovascular Patient has history of Deep Vein Thrombosis, Hypertension, Peripheral Venous Disease - Left Leg Endocrine Patient has history of Type II Diabetes Musculoskeletal Patient has history  of Gout, Osteoarthritis Neurologic Patient has history of Neuropathy Oncologic Patient has history of Received Radiation - Prostate Cancer 2001 Patient is treated with Insulin. Blood sugar is tested. Medical A Surgical History Notes nd Cardiovascular 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 Genitourinary CKD Stage 3 Review of Systems (ROS) Ear/Nose/Mouth/Throat Denies complaints or symptoms of Chronic sinus problems or rhinitis. Gastrointestinal Denies complaints or symptoms of Frequent diarrhea, Nausea, Vomiting. Integumentary (Skin) Complains or has symptoms of Wounds. Psychiatric Denies complaints or symptoms of Claustrophobia, Suicidal. Objective Constitutional patient is hypertensive.. pulse regular and within target range for patient.Marland Kitchen respirations regular, non-labored and within target range for patient.Marland Kitchen temperature within target range for patient.. Well-nourished and well-hydrated in no acute distress. Vitals Time Taken: 1:12 PM, Temperature: 98.1 F, Pulse: 90 bpm, Respiratory Rate: 20 breaths/min, Blood Pressure: 167/93 mmHg, Capillary Blood Glucose: 160 mg/dl. General Notes: Glucose per patient report Eyes conjunctiva clear no eyelid edema noted. pupils equal round and reactive to light and accommodation. Ears, Nose, Mouth, and Throat no gross abnormality of ear auricles or external auditory canals. normal hearing noted during conversation. mucus membranes moist. Respiratory normal breathing without difficulty. Cardiovascular Absent posterior tibial and dorsalis pedis pulses bilateral lower extremities. no clubbing, cyanosis, significant edema, Musculoskeletal Patient unable to walk without assistance. Patient does ambulate with the use of a prosthesis for his right leg  below-knee amputation.Marland Kitchen Psychiatric this patient is able to make decisions and demonstrates good insight into disease process. Alert and Oriented x 3. pleasant and cooperative. General Notes: Upon inspection patient's wound bed actually showed signs of fairly good granulation epithelization at this point. There does not appear to be any signs of active infection which is great news and overall I am pleased in that regard. 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 Obviously the goal is good to be trying to prevent this from developing into a wet gangrene which would obviously be more of a problem for him Is at risk of an infection spreading and amputation is concerned. Currently the been using Betadine topically which actually I think is probably a very good option considering all things. Integumentary (Hair, Skin) Wound #1 status is Open. Original cause of wound was Trauma. The date acquired was: 05/13/2020. The wound is located on the Left T Fourth. The wound oe measures 1.3cm length x 1.4cm width x 0.1cm depth; 1.429cm^2 area and 0.143cm^3 volume. There is Fat Layer (Subcutaneous Tissue) exposed. There is no tunneling or undermining noted. There is a medium amount of serosanguineous drainage noted. The wound margin is distinct with the outline attached to the wound base. There is no granulation within the wound bed. There is a large (67-100%) amount of necrotic tissue within the wound bed including Eschar. Assessment Active Problems ICD-10 Type 2 diabetes mellitus with foot ulcer Non-pressure chronic ulcer of other part of left foot with fat layer exposed Chronic kidney disease, stage 3 unspecified Long term (current) use of anticoagulants Plan Follow-up Appointments: Return Appointment in 2 weeks. Bathing/ Shower/ Hygiene: May shower and wash wound with soap and water. WOUND #1: - T Fourth Wound Laterality:  Left oe Topical: Povidone Iodine Swabstick 3 pack, 4(in) 1 x Per Day/30 Days Discharge Instructions: paint with betadine Secondary Dressing: Woven Gauze Sponges 2x2 in 1 x Per Day/30 Days Discharge Instructions: Apply over primary dressing as directed. Secured With: Child psychotherapist, Sterile 2x75 (in/in) 1 x Per Day/30 Days Discharge Instructions: Secure with  stretch gauze as directed. 1. Would recommend at this time that based on what we see here we go ahead and initiate treatment with a continuation of the Betadine paint on the toe area of the left fourth toe daily. I think a dry gauze to cover is appropriate following the Betadine painting. 2. I am also can recommend that the goal should be that we try to keep this as dry and stable as possible. I explained to the patient that that is probably her best bet as far as trying to keep things from getting any worse and developing into a wet gangrene situation which of course would be a greater risk to him overall. Patient voiced understanding. 3. Again the goal is to try to prevent any type of amputation as were concerned that likely an amputation of the toe would not heal due to the poor arterial status therefore he probably would be looking at above-knee amputation potentially but more likely a below-knee amputation. Either way I would prefer not do not be talking about this at all if at all possible but with his stage III kidney disease he is much more concerned about his kidneys that about saving his leg which in some ways I really cannot blame him. We will see patient back for reevaluation in 3 weeks here in the clinic. If anything worsens or changes patient will contact our office for additional recommendations. Electronic Signature(s) Signed: 08/21/2020 3:01:56 PM By: Worthy Keeler PA-C Entered By: Worthy Keeler on 08/21/2020 15:01:55 -------------------------------------------------------------------------------- HxROS  Details Patient Name: Date of Service: Patrick Shelton 08/21/2020 1:15 PM Medical Record Number: [id number] Patient Account Number: [id number] Date of Birth/Sex: Treating RN: [birth year] (80 y.o. [name] Primary Care Provider: Altheimer, Juanda Bond Other Clinician: Referring Provider: Treating Provider/Extender: Worthy Keeler Altheimer, Docia Chuck in Treatment: 0 Information Obtained From Patient Ear/Nose/Mouth/Throat Complaints and Symptoms: Negative for: Chronic sinus problems or rhinitis Gastrointestinal Complaints and Symptoms: Negative for: Frequent diarrhea; Nausea; Vomiting Integumentary (Skin) Complaints and Symptoms: Positive for: Wounds Psychiatric Complaints and Symptoms: Negative for: Claustrophobia; Suicidal Eyes Medical History: Positive for: Cataracts - removed Hematologic/Lymphatic Respiratory Cardiovascular Medical History: Positive for: Deep Vein Thrombosis; Hypertension; Peripheral Venous Disease - Left Leg Past Medical History Notes: 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 Endocrine Medical History: Positive for: Type II Diabetes Time with diabetes: 31 Treated with: Insulin Blood sugar tested every day: Yes Tested : 3-4x day Genitourinary Medical History: Past Medical History Notes: CKD Stage 3 Immunological Musculoskeletal Medical History: Positive for: Gout; Osteoarthritis Neurologic Medical History: Positive for: Neuropathy Oncologic Medical History: Positive for: Received Radiation - Prostate Cancer 2001 HBO Extended History Items Eyes: Cataracts Immunizations Pneumococcal Vaccine: Received Pneumococcal Vaccination: Yes Implantable Devices None Family and Social History Cancer: No; Diabetes: Yes - Father; Heart Disease: No; Hereditary Spherocytosis: No; Hypertension: No; Kidney Disease: No; Lung Disease: No; Seizures: No; Stroke: No; Thyroid Problems: No; Tuberculosis: No; Former smoker; Marital Status - Married; Alcohol Use:  Rarely; Drug Use: No History; Caffeine Use: Rarely; Financial Concerns: No; Food, Clothing or Shelter Needs: No; Support System Lacking: No; Transportation Concerns: No Electronic Signature(s) Signed: 08/21/2020 2:58:24 PM By: Lorrin Jackson Signed: 08/21/2020 6:55:53 PM By: Worthy Keeler PA-C Entered By: Lorrin Jackson on 08/21/2020 13:25:09 -------------------------------------------------------------------------------- SuperBill Details Patient Name: Date of Service: Patrick Shelton 08/21/2020 Medical Record Number: [id number] Patient Account Number: [id number] Date of Birth/Sex: Treating RN: 07-28-1940 (80 y.o. [name] Primary Care Provider: Altheimer, Juanda Bond  Other Clinician: Referring Provider: Treating Provider/Extender: Worthy Keeler Altheimer, Docia Chuck in Treatment: 0 Diagnosis Coding ICD-10 Codes Code Description E11.621 Type 2 diabetes mellitus with foot ulcer L97.522 Non-pressure chronic ulcer of other part of left foot with fat layer exposed N18.30 Chronic kidney disease, stage 3 unspecified Z79.01 Long term (current) use of anticoagulants Facility Procedures Physician Procedures : CPT4 Code Description Modifier [id number] - WC PHYS LEVEL 3 - EST PT ICD-10 Diagnosis Description E11.621 Type 2 diabetes mellitus with foot ulcer L97.522 Non-pressure chronic ulcer of other part of left foot with fat layer exposed N18.30 Chronic  kidney disease, stage 3 unspecified Z79.01 Long term (current) use of anticoagulants Quantity: 1 Electronic Signature(s) Signed: 08/21/2020 3:02:09 PM By: Worthy Keeler PA-C Entered By: Worthy Keeler on 08/21/2020 15:02:09

## 2020-08-21 NOTE — Progress Notes (Signed)
Patrick, Shelton ([id number]) Visit Report for 08/21/2020 Abuse/Suicide Risk Screen Details Patient Name: Date of Service: Patrick Shelton 08/21/2020 1:15 PM Medical Record Number: [id number] Patient Account Number: [id number] Date of Birth/Sex: Treating RN: December 02, 1940 (80 y.o. [name] Primary Care Johnathan Heskett: Altheimer, Juanda Bond Other Clinician: Referring Deane Melick: Treating Domenik Trice/Extender: Worthy Keeler Altheimer, Docia Chuck in Treatment: 0 Abuse/Suicide Risk Screen Items Answer ABUSE RISK SCREEN: Has anyone close to you tried to hurt or harm you recentlyo No Do you feel uncomfortable with anyone in your familyo No Has anyone forced you do things that you didnt want to doo No Electronic Signature(s) Signed: 08/21/2020 2:58:24 PM By: Lorrin Jackson Entered By: Lorrin Jackson on 08/21/2020 13:25:16 -------------------------------------------------------------------------------- Activities of Daily Living Details Patient Name: Date of Service: Patrick Shelton 08/21/2020 1:15 PM Medical Record Number: [id number] Patient Account Number: [id number] Date of Birth/Sex: Treating RN: 04-15-1940 (80 y.o. [name] Primary Care Doug Bucklin: Altheimer, Juanda Bond Other Clinician: Referring Clora Ohmer: Treating Yasiel Goyne/Extender: Worthy Keeler Altheimer, Docia Chuck in Treatment: 0 Activities of Daily Living Items Answer Activities of Daily Living (Please select one for each item) Drive Automobile Need Assistance T Medications ake Not Able Use T elephone Completely Able Care for Appearance Completely Able Use T oilet Completely Able Bath / Shower Need Assistance Dress Self Need Assistance Feed Self Completely Able Walk Need Assistance Get In / Out Bed Need Assistance Housework Need Assistance Prepare Meals Need Assistance Handle Money Completely Able Shop for Self Need Assistance Electronic Signature(s) Signed: 08/21/2020 2:58:24 PM By: Lorrin Jackson Entered By: Lorrin Jackson on 08/21/2020 13:26:07 -------------------------------------------------------------------------------- Education Screening Details Patient Name: Date of Service: Patrick Shelton 08/21/2020 1:15 PM Medical Record Number: [id number] Patient Account Number: [id number] Date of Birth/Sex: Treating RN: Jun 06, 1940 (80 y.o. [name] Primary Care Azell Bill: Altheimer, Juanda Bond Other Clinician: Referring Toshiko Kemler: Treating Kelsey Edman/Extender: Worthy Keeler Altheimer, Docia Chuck in Treatment: 0 Primary Learner Assessed: Patient Learning Preferences/Education Level/Primary Language Learning Preference: Explanation, Demonstration, Printed Material Highest Education Level: College or Above Preferred Language: English Cognitive Barrier Language Barrier: No Translator Needed: No Memory Deficit: No Emotional Barrier: No Cultural/Religious Beliefs Affecting Medical Care: No Physical Barrier Impaired Vision: Yes Glasses Impaired Hearing: No Decreased Hand dexterity: No Knowledge/Comprehension Knowledge Level: High Comprehension Level: High Ability to understand written instructions: High Ability to understand verbal instructions: High Motivation Anxiety Level: Calm Cooperation: Cooperative Education Importance: Acknowledges Need Interest in Health Problems: Asks Questions Perception: Coherent Willingness to Engage in Self-Management High Activities: Readiness to Engage in Self-Management High Activities: Electronic Signature(s) Signed: 08/21/2020 2:58:24 PM By: Lorrin Jackson Entered By: Lorrin Jackson on 08/21/2020 13:26:54 -------------------------------------------------------------------------------- Fall Risk Assessment Details Patient Name: Date of Service: Patrick Shelton 08/21/2020 1:15 PM Medical Record Number: [id number] Patient Account Number: [id number] Date of Birth/Sex: Treating RN: 1940/06/19 (80 y.o. [name] Primary Care Devra Stare: Altheimer, Juanda Bond Other Clinician: Referring Antonius Hartlage: Treating Bethani Brugger/Extender: Worthy Keeler Altheimer, Docia Chuck in Treatment: 0 Fall Risk Assessment Items Have you had 2 or more falls in the last 12 monthso 0 No Have you had any fall that resulted in injury in the last 12 monthso 0 No FALLS RISK SCREEN History of falling - immediate or within 3 months 0 No Secondary diagnosis (Do you have 2 or more medical diagnoseso) 0 No Ambulatory aid None/bed rest/wheelchair/nurse 0 No Crutches/cane/walker 15 Yes Furniture 0 No Intravenous therapy Access/Saline/Heparin Lock 0 No  Gait/Transferring Normal/ bed rest/ wheelchair 0 No Weak (short steps with or without shuffle, stooped but able to lift head while walking, may seek 10 Yes support from furniture) Impaired (short steps with shuffle, may have difficulty arising from chair, head down, impaired 0 No balance) Mental Status Oriented to own ability 0 Yes Electronic Signature(s) Signed: 08/21/2020 2:58:24 PM By: Lorrin Jackson Entered By: Lorrin Jackson on 08/21/2020 13:27:11 -------------------------------------------------------------------------------- Foot Assessment Details Patient Name: Date of Service: Patrick Shelton 08/21/2020 1:15 PM Medical Record Number: [id number] Patient Account Number: [id number] Date of Birth/Sex: Treating RN: 1940/08/20 (80 y.o. [name] Primary Care Malik Ruffino: Altheimer, Juanda Bond Other Clinician: Referring Vaden Becherer: Treating Aniket Paye/Extender: Worthy Keeler Altheimer, Docia Chuck in Treatment: 0 Foot Assessment Items Site Locations + = Sensation present, - = Sensation absent, C = Callus, U = Ulcer R = Redness, W = Warmth, Shelton = Maceration, PU = Pre-ulcerative lesion F = Fissure, S = Swelling, D = Dryness Assessment Right: Left: Other Deformity: No No Prior Foot Ulcer: No No Prior Amputation: Yes No Charcot Joint: No No Ambulatory  Status: Ambulatory With Help Assistance Device: Wheelchair Gait: Steady Notes Right BKA Electronic Signature(s) Signed: 08/21/2020 2:58:24 PM By: Lorrin Jackson Entered By: Lorrin Jackson on 08/21/2020 13:30:55 -------------------------------------------------------------------------------- Nutrition Risk Screening Details Patient Name: Date of Service: Patrick Shelton 08/21/2020 1:15 PM Medical Record Number: [id number] Patient Account Number: [id number] Date of Birth/Sex: Treating RN: 08-31-1940 (80 y.o. [name] Primary Care Kainoah Bartosiewicz: Altheimer, Juanda Bond Other Clinician: Referring Neylan Koroma: Treating Ender Rorke/Extender: Worthy Keeler Altheimer, Docia Chuck in Treatment: 0 Height (in): Weight (lbs): Body Mass Index (BMI): Nutrition Risk Screening Items Score Screening NUTRITION RISK SCREEN: I have an illness or condition that made me change the kind and/or amount of food I eat 0 No I eat fewer than two meals per day 0 No I eat few fruits and vegetables, or milk products 0 No I have three or more drinks of beer, liquor or wine almost every day 0 No I have tooth or mouth problems that make it hard for me to eat 0 No I don't always have enough money to buy the food I need 0 No I eat alone most of the time 0 No I take three or more different prescribed or over-the-counter drugs a day 1 Yes Without wanting to, I have lost or gained 10 pounds in the last six months 0 No I am not always physically able to shop, cook and/or feed myself 0 No Nutrition Protocols Good Risk Protocol 0 No interventions needed Moderate Risk Protocol High Risk Proctocol Risk Level: Good Risk Score: 1 Electronic Signature(s) Signed: 08/21/2020 2:58:24 PM By: Lorrin Jackson Entered By: Lorrin Jackson on 08/21/2020 13:27:28

## 2020-08-21 NOTE — Progress Notes (Addendum)
Patrick Shelton, Patrick Shelton ([id number]) Visit Report for 08/21/2020 Allergy List Details Patient Name: Date of Service: Patrick Shelton 08/21/2020 1:15 PM Medical Record Number: [id number] Patient Account Number: [id number] Date of Birth/Sex: Treating RN: 01-Aug-1940 (79 y.o. Male) Lorrin Jackson Primary Care Camari Wisham: Altheimer, Juanda Bond Other Clinician: Referring Yazleemar Strassner: Treating Skyleen Bentley/Extender: Worthy Keeler Altheimer, Docia Chuck in Treatment: 0 Allergies Active Allergies prednisone Reaction: Anaphylaxis dulaglutide Reaction: nausea Allergy Notes Electronic Signature(s) Signed: 08/21/2020 2:58:24 PM By: Lorrin Jackson Entered By: Lorrin Jackson on 08/21/2020 13:17:49 -------------------------------------------------------------------------------- Arrival Information Details Patient Name: Date of Service: Patrick Shelton 08/21/2020 1:15 PM Medical Record Number: [id number] Patient Account Number: [id number] Date of Birth/Sex: Treating RN: [birth year] (79 y.o. Male) Lorrin Jackson Primary Care Jermesha Sottile: Altheimer, Juanda Bond Other Clinician: Referring Kagen Kunath: Treating Saylor Murry/Extender: Worthy Keeler Altheimer, Docia Chuck in Treatment: 0 Visit Information Patient Arrived: Wheel Chair Arrival Time: 13:11 Accompanied By: wife Transfer Assistance: Manual Patient Identification Verified: Yes Secondary Verification Process Completed: Yes Patient Requires Transmission-Based Precautions: No Patient Has Alerts: Yes Patient Alerts: Patient on Blood Thinner L ABI=[place] L TBI=0.34 Electronic Signature(s) Signed: 08/21/2020 2:01:59 PM By: Lorrin Jackson Entered By: Lorrin Jackson on 08/21/2020 14:01:58 -------------------------------------------------------------------------------- Clinic Level of Care Assessment Details Patient Name: Date of Service: Patrick Shelton 08/21/2020 1:15 PM Medical Record Number: [id number] Patient Account Number: [id number] Date of Birth/Sex:  Treating RN: 1940-07-14 (79 y.o. Male) Baruch Gouty Primary Care Miela Desjardin: Altheimer, Juanda Bond Other Clinician: Referring Madalyn Legner: Treating Cordarius Benning/Extender: Worthy Keeler Altheimer, Docia Chuck in Treatment: 0 Clinic Level of Care Assessment Items TOOL 2 Quantity Score []  - 0 Use when only an EandM is performed on the INITIAL visit ASSESSMENTS - Nursing Assessment / Reassessment X- 1 20 General Physical Exam (combine w/ comprehensive assessment (listed just below) when performed on new pt. evals) X- 1 25 Comprehensive Assessment (HX, ROS, Risk Assessments, Wounds Hx, etc.) ASSESSMENTS - Wound and Skin A ssessment / Reassessment X - Simple Wound Assessment / Reassessment - one wound 1 5 []  - 0 Complex Wound Assessment / Reassessment - multiple wounds []  - 0 Dermatologic / Skin Assessment (not related to wound area) ASSESSMENTS - Ostomy and/or Continence Assessment and Care []  - 0 Incontinence Assessment and Management []  - 0 Ostomy Care Assessment and Management (repouching, etc.) PROCESS - Coordination of Care X - Simple Patient / Family Education for ongoing care 1 15 []  - 0 Complex (extensive) Patient / Family Education for ongoing care X- 1 10 Staff obtains Programmer, systems, Records, T Results / Process Orders est []  - 0 Staff telephones HHA, Nursing Homes / Clarify orders / etc []  - 0 Routine Transfer to another Facility (non-emergent condition) []  - 0 Routine Hospital Admission (non-emergent condition) []  - 0 New Admissions / Biomedical engineer / Ordering NPWT Apligraf, etc. , []  - 0 Emergency Hospital Admission (emergent condition) X- 1 10 Simple Discharge Coordination []  - 0 Complex (extensive) Discharge Coordination PROCESS - Special Needs []  - 0 Pediatric / Minor Patient Management []  - 0 Isolation Patient Management []  - 0 Hearing / Language / Visual special needs []  - 0 Assessment of Community assistance (transportation, D/C planning,  etc.) []  - 0 Additional assistance / Altered mentation []  - 0 Support Surface(s) Assessment (bed, cushion, seat, etc.) INTERVENTIONS - Wound Cleansing / Measurement X- 1 5 Wound Imaging (photographs - any number of wounds) []  - 0 Wound Tracing (instead of photographs) X- 1 5 Simple Wound Measurement - one wound []  -  0 Complex Wound Measurement - multiple wounds X- 1 5 Simple Wound Cleansing - one wound []  - 0 Complex Wound Cleansing - multiple wounds INTERVENTIONS - Wound Dressings X - Small Wound Dressing one or multiple wounds 1 10 []  - 0 Medium Wound Dressing one or multiple wounds []  - 0 Large Wound Dressing one or multiple wounds []  - 0 Application of Medications - injection INTERVENTIONS - Miscellaneous X- 1 5 External ear exam []  - 0 Specimen Collection (cultures, biopsies, blood, body fluids, etc.) []  - 0 Specimen(s) / Culture(s) sent or taken to Lab for analysis []  - 0 Patient Transfer (multiple staff / Harrel Lemon Lift / Similar devices) []  - 0 Simple Staple / Suture removal (25 or less) []  - 0 Complex Staple / Suture removal (26 or more) []  - 0 Hypo / Hyperglycemic Management (close monitor of Blood Glucose) []  - 0 Ankle / Brachial Index (ABI) - do not check if billed separately Has the patient been seen at the hospital within the last three years: Yes Total Score: 115 Level Of Care: New/Established - Level 3 Electronic Signature(s) Signed: 08/21/2020 6:34:46 PM By: Baruch Gouty RN, BSN Entered By: Baruch Gouty on 08/21/2020 14:23:55 -------------------------------------------------------------------------------- Encounter Discharge Information Details Patient Name: Date of Service: Patrick Shelton 08/21/2020 1:15 PM Medical Record Number: [id number] Patient Account Number: [id number] Date of Birth/Sex: Treating RN: 1940/12/30 (79 y.o. Male) Rhae Hammock Primary Care Breland Elders: Altheimer, Juanda Bond Other Clinician: Referring Raydon Chappuis: Treating  Jelissa Espiritu/Extender: Worthy Keeler Altheimer, Docia Chuck in Treatment: 0 Encounter Discharge Information Items Discharge Condition: Stable Ambulatory Status: Wheelchair Discharge Destination: Home Transportation: Private Auto Accompanied By: wife Schedule Follow-up Appointment: Yes Clinical Summary of Care: Patient Declined Electronic Signature(s) Signed: 08/21/2020 6:22:58 PM By: Rhae Hammock RN Entered By: Rhae Hammock on 08/21/2020 14:43:14 -------------------------------------------------------------------------------- Lower Extremity Assessment Details Patient Name: Date of Service: Patrick Shelton 08/21/2020 1:15 PM Medical Record Number: [id number] Patient Account Number: [id number] Date of Birth/Sex: Treating RN: 1940-06-24 (79 y.o. Male) Lorrin Jackson Primary Care Tonea Leiphart: Altheimer, Juanda Bond Other Clinician: Referring Seaver Machia: Treating Dezi Schaner/Extender: Worthy Keeler Altheimer, Docia Chuck in Treatment: 0 Edema Assessment Assessed: Shirlyn Goltz: Yes] [Right: No] Edema: [Left: Ye] [Right: s] Calf Left: Right: Point of Measurement: 38 cm From Medial Instep 44.5 cm Ankle Left: Right: Point of Measurement: 0 cm From Medial Instep 27.4 cm Knee To Floor Left: Right: From Medial Instep 47 cm Notes Vascular 08/14/20 Left Leg=Non Compressible TBI=0.34 Electronic Signature(s) Signed: 08/21/2020 2:58:24 PM By: Lorrin Jackson Entered By: Lorrin Jackson on 08/21/2020 13:38:27 -------------------------------------------------------------------------------- Multi-Disciplinary Care Plan Details Patient Name: Date of Service: Patrick Shelton 08/21/2020 1:15 PM Medical Record Number: [id number] Patient Account Number: [id number] Date of Birth/Sex: Treating RN: Jul 24, 1940 (79 y.o. Male) Baruch Gouty Primary Care Leslye Puccini: Altheimer, Juanda Bond Other Clinician: Referring Ndeye Tenorio: Treating Matia Zelada/Extender: Worthy Keeler Altheimer, Docia Chuck  in Treatment: 0 Multidisciplinary Care Plan reviewed with physician Active Inactive Nutrition Nursing Diagnoses: Impaired glucose control: actual or potential Potential for alteratiion in Nutrition/Potential for imbalanced nutrition Goals: Patient/caregiver will maintain therapeutic glucose control Date Initiated: 08/21/2020 Target Resolution Date: 09/18/2020 Goal Status: Active Interventions: Assess HgA1c results as ordered upon admission and as needed Provide education on elevated blood sugars and impact on wound healing Treatment Activities: Patient referred to Primary Care Physician for further nutritional evaluation : 08/21/2020 Notes: Tissue Oxygenation Nursing Diagnoses: Actual ineffective tissue perfusion; peripheral (select once diagnosis is confirmed) Knowledge deficit related to disease process  and management Goals: Patient/caregiver will verbalize understanding of disease process and disease management Date Initiated: 08/21/2020 Target Resolution Date: 09/18/2020 Goal Status: Active Interventions: Assess patient understanding of disease process and management upon diagnosis and as needed Assess peripheral arterial status upon admission and as needed Provide education on tissue oxygenation and ischemia Treatment Activities: Revascularization procedures : 08/21/2020 T ordered outside of clinic : 08/21/2020 est Notes: Wound/Skin Impairment Nursing Diagnoses: Impaired tissue integrity Knowledge deficit related to ulceration/compromised skin integrity Goals: Patient/caregiver will verbalize understanding of skin care regimen Date Initiated: 08/21/2020 Target Resolution Date: 09/18/2020 Goal Status: Active Ulcer/skin breakdown will have a volume reduction of 30% by week 4 Date Initiated: 08/21/2020 Target Resolution Date: 09/18/2020 Goal Status: Active Interventions: Assess patient/caregiver ability to obtain necessary supplies Assess patient/caregiver ability to perform  ulcer/skin care regimen upon admission and as needed Assess ulceration(s) every visit Provide education on ulcer and skin care Treatment Activities: Skin care regimen initiated : 08/21/2020 Topical wound management initiated : 08/21/2020 Notes: Electronic Signature(s) Signed: 08/21/2020 6:34:46 PM By: Baruch Gouty RN, BSN Entered By: Baruch Gouty on 08/21/2020 14:22:03 -------------------------------------------------------------------------------- Pain Assessment Details Patient Name: Date of Service: Patrick Shelton 08/21/2020 1:15 PM Medical Record Number: [id number] Patient Account Number: [id number] Date of Birth/Sex: Treating RN: 08/18/1940 (79 y.o. Male) Lorrin Jackson Primary Care Kalesha Irving: Altheimer, Juanda Bond Other Clinician: Referring Aran Menning: Treating Morris Markham/Extender: Worthy Keeler Altheimer, Docia Chuck in Treatment: 0 Active Problems Location of Pain Severity and Description of Pain Patient Has Paino No Site Locations Pain Management and Medication Current Pain Management: Electronic Signature(s) Signed: 08/21/2020 2:58:24 PM By: Lorrin Jackson Entered By: Lorrin Jackson on 08/21/2020 13:27:52 -------------------------------------------------------------------------------- Patient/Caregiver Education Details Patient Name: Date of Service: Patrick Shelton 6/15/2022andnbsp1:15 PM Medical Record Number: [id number] Patient Account Number: [id number] Date of Birth/Gender: Treating RN: 01-21-1941 (79 y.o. Male) Baruch Gouty Primary Care Physician: Elyse Hsu Juanda Bond Other Clinician: Referring Physician: Treating Physician/Extender: Worthy Keeler Altheimer, Docia Chuck in Treatment: 0 Education Assessment Education Provided To: Patient Education Topics Provided Elevated Blood Sugar/ Impact on Healing: Handouts: Elevated Blood Sugars: How Do They Affect Wound Healing Methods: Explain/Verbal, Printed Responses: Reinforcements  needed, State content correctly Tissue Oxygenation: Handouts: Peripheral Arterial Disease and Related Ulcers Methods: Explain/Verbal, Printed Responses: Reinforcements needed, State content correctly [place]: o Handouts: Welcome T [facility] [place] o Methods: Explain/Verbal, Printed Responses: Reinforcements needed, State content correctly Wound/Skin Impairment: Electronic Signature(s) Signed: 08/21/2020 6:34:46 PM By: Baruch Gouty RN, BSN Entered By: Baruch Gouty on 08/21/2020 14:23:08 -------------------------------------------------------------------------------- Wound Assessment Details Patient Name: Date of Service: Patrick Shelton 08/21/2020 1:15 PM Medical Record Number: [id number] Patient Account Number: [id number] Date of Birth/Sex: Treating RN: 08/31/1940 (79 y.o. Male) Lorrin Jackson Primary Care Kehinde Totzke: Altheimer, Juanda Bond Other Clinician: Referring Maycen Degregory: Treating Lusero Nordlund/Extender: Worthy Keeler Altheimer, Docia Chuck in Treatment: 0 Wound Status Wound Number: 1 Primary Diabetic Wound/Ulcer of the Lower Extremity Etiology: Wound Location: Left T Fourth oe Wound Open Wounding Event: Trauma Status: Date Acquired: 05/13/2020 Comorbid Cataracts, Deep Vein Thrombosis, Hypertension, Peripheral Weeks Of Treatment: 0 History: Venous Disease, Type II Diabetes, Gout, Osteoarthritis, Clustered Wound: No Neuropathy, Received Radiation Pending Amputation On Presentation Photos Wound Measurements Length: (cm) 1.3 Width: (cm) 1.4 Depth: (cm) 0.1 Area: (cm) 1.429 Volume: (cm) 0.143 % Reduction in Area: 0% % Reduction in Volume: 0% Epithelialization: None Tunneling: No Undermining: No Wound Description Classification: Grade 1 Wound Margin: Distinct, outline attached Exudate  Amount: Medium Exudate Type: Serosanguineous Exudate Color: red, brown Foul Odor After Cleansing: No Slough/Fibrino No Wound  Bed Granulation Amount: None Present (0%) Exposed Structure Necrotic Amount: Large (67-100%) Fascia Exposed: No Necrotic Quality: Eschar Fat Layer (Subcutaneous Tissue) Exposed: Yes Tendon Exposed: No Muscle Exposed: No Joint Exposed: No Bone Exposed: No Treatment Notes Wound #1 (Toe Fourth) Wound Laterality: Left Cleanser Peri-Wound Care Topical Povidone Iodine Swabstick 3 pack, 4(in) Discharge Instruction: paint with betadine Primary Dressing Secondary Dressing Woven Gauze Sponges 2x2 in Discharge Instruction: Apply over primary dressing as directed. Secured With Conforming Stretch Gauze Bandage, Sterile 2x75 (in/in) Discharge Instruction: Secure with stretch gauze as directed. Compression Wrap Compression Stockings Add-Ons Electronic Signature(s) Signed: 08/22/2020 5:21:02 PM By: Sandre Kitty Signed: 08/22/2020 6:07:06 PM By: Lorrin Jackson Previous Signature: 08/21/2020 2:57:14 PM Version By: Worthy Keeler PA-C Previous Signature: 08/21/2020 2:58:24 PM Version By: Lorrin Jackson Entered By: Sandre Kitty on 08/22/2020 16:33:51 -------------------------------------------------------------------------------- Vitals Details Patient Name: Date of Service: Patrick Shelton 08/21/2020 1:15 PM Medical Record Number: [id number] Patient Account Number: [id number] Date of Birth/Sex: Treating RN: [birth year] (79 y.o. Male) Lorrin Jackson Primary Care Isebella Upshur: Altheimer, Juanda Bond Other Clinician: Referring Khristian Phillippi: Treating Courtlyn Aki/Extender: Worthy Keeler Altheimer, Docia Chuck in Treatment: 0 Vital Signs Time Taken: 13:12 Temperature (F): 98.1 Pulse (bpm): 90 Respiratory Rate (breaths/min): 20 Blood Pressure (mmHg): 167/93 Capillary Blood Glucose (mg/dl): 160 Reference Range: 80 - 120 mg / dl Notes Glucose per patient report Electronic Signature(s) Signed: 08/21/2020 2:58:24 PM By: Lorrin Jackson Entered By: Lorrin Jackson on 08/21/2020 13:13:24

## 2020-09-04 ENCOUNTER — Encounter (HOSPITAL_BASED_OUTPATIENT_CLINIC_OR_DEPARTMENT_OTHER): Payer: Medicare Other | Admitting: Physician Assistant

## 2020-09-04 ENCOUNTER — Other Ambulatory Visit: Payer: Self-pay

## 2020-09-04 DIAGNOSIS — Z87891 Personal history of nicotine dependence: Secondary | ICD-10-CM | POA: Diagnosis not present

## 2020-09-04 DIAGNOSIS — L97522 Non-pressure chronic ulcer of other part of left foot with fat layer exposed: Secondary | ICD-10-CM | POA: Diagnosis not present

## 2020-09-04 DIAGNOSIS — E1151 Type 2 diabetes mellitus with diabetic peripheral angiopathy without gangrene: Secondary | ICD-10-CM | POA: Diagnosis not present

## 2020-09-04 DIAGNOSIS — E11621 Type 2 diabetes mellitus with foot ulcer: Secondary | ICD-10-CM | POA: Diagnosis not present

## 2020-09-04 DIAGNOSIS — I129 Hypertensive chronic kidney disease with stage 1 through stage 4 chronic kidney disease, or unspecified chronic kidney disease: Secondary | ICD-10-CM | POA: Diagnosis not present

## 2020-09-04 DIAGNOSIS — E1122 Type 2 diabetes mellitus with diabetic chronic kidney disease: Secondary | ICD-10-CM | POA: Diagnosis not present

## 2020-09-04 NOTE — Progress Notes (Addendum)
ZAMARIAN, SCARANO ([id number]) Visit Report for 09/04/2020 Arrival Information Details Patient Name: Date of Service: Patrick Shelton 09/04/2020 2:30 PM Medical Record Number: [id number] Patient Account Number: [id number] Date of Birth/Sex: Treating RN: 1941-01-14 (80 y.o. [name] Primary Care Kalliopi Coupland: Altheimer, Juanda Bond Other Clinician: Referring Natayla Cadenhead: Treating Rahiem Schellinger/Extender: Worthy Keeler Altheimer, Docia Chuck in Treatment: 2 Visit Information History Since Last Visit Added or deleted any medications: No Patient Arrived: Wheel Chair Any new allergies or adverse reactions: No Arrival Time: 15:09 Had a fall or experienced change in No Accompanied By: wife activities of daily living that may affect Transfer Assistance: None risk of falls: Patient Identification Verified: Yes Signs or symptoms of abuse/neglect since last visito No Secondary Verification Process Completed: Yes Hospitalized since last visit: No Patient Requires Transmission-Based Precautions: No Implantable device outside of the clinic excluding No Patient Has Alerts: Yes cellular tissue based products placed in the center Patient Alerts: Patient on Blood Thinner since last visit: L ABI=[place] L TBI=0.34 Has Dressing in Place as Prescribed: Yes Pain Present Now: No Electronic Signature(s) Signed: 09/04/2020 5:38:09 PM By: Lorrin Jackson Entered By: Lorrin Jackson on 09/04/2020 15:15:52 -------------------------------------------------------------------------------- Clinic Level of Care Assessment Details Patient Name: Date of Service: Patrick Shelton 09/04/2020 2:30 PM Medical Record Number: [id number] Patient Account Number: [id number] Date of Birth/Sex: Treating RN: Oct 26, 1940 (80 y.o. [name] Primary Care Albaro Deviney: Altheimer, Juanda Bond Other Clinician: Referring Harrie Cazarez: Treating Rollie Hynek/Extender: Worthy Keeler Altheimer, Docia Chuck in Treatment: 2 Clinic Level of  Care Assessment Items TOOL 4 Quantity Score []  - 0 Use when only an EandM is performed on FOLLOW-UP visit ASSESSMENTS - Nursing Assessment / Reassessment X- 1 10 Reassessment of Co-morbidities (includes updates in patient status) X- 1 5 Reassessment of Adherence to Treatment Plan ASSESSMENTS - Wound and Skin A ssessment / Reassessment X - Simple Wound Assessment / Reassessment - one wound 1 5 []  - 0 Complex Wound Assessment / Reassessment - multiple wounds []  - 0 Dermatologic / Skin Assessment (not related to wound area) ASSESSMENTS - Focused Assessment []  - 0 Circumferential Edema Measurements - multi extremities []  - 0 Nutritional Assessment / Counseling / Intervention X- 1 5 Lower Extremity Assessment (monofilament, tuning fork, pulses) []  - 0 Peripheral Arterial Disease Assessment (using hand held doppler) ASSESSMENTS - Ostomy and/or Continence Assessment and Care []  - 0 Incontinence Assessment and Management []  - 0 Ostomy Care Assessment and Management (repouching, etc.) PROCESS - Coordination of Care X - Simple Patient / Family Education for ongoing care 1 15 []  - 0 Complex (extensive) Patient / Family Education for ongoing care X- 1 10 Staff obtains Programmer, systems, Records, T Results / Process Orders est []  - 0 Staff telephones HHA, Nursing Homes / Clarify orders / etc []  - 0 Routine Transfer to another Facility (non-emergent condition) []  - 0 Routine Hospital Admission (non-emergent condition) []  - 0 New Admissions / Biomedical engineer / Ordering NPWT Apligraf, etc. , []  - 0 Emergency Hospital Admission (emergent condition) X- 1 10 Simple Discharge Coordination []  - 0 Complex (extensive) Discharge Coordination PROCESS - Special Needs []  - 0 Pediatric / Minor Patient Management []  - 0 Isolation Patient Management []  - 0 Hearing / Language / Visual special needs []  - 0 Assessment of Community assistance (transportation, D/C planning, etc.) []  -  0 Additional assistance / Altered mentation []  - 0 Support Surface(s) Assessment (bed, cushion, seat, etc.) INTERVENTIONS - Wound Cleansing / Measurement X - Simple  Wound Cleansing - one wound 1 5 []  - 0 Complex Wound Cleansing - multiple wounds X- 1 5 Wound Imaging (photographs - any number of wounds) []  - 0 Wound Tracing (instead of photographs) X- 1 5 Simple Wound Measurement - one wound []  - 0 Complex Wound Measurement - multiple wounds INTERVENTIONS - Wound Dressings X - Small Wound Dressing one or multiple wounds 1 10 []  - 0 Medium Wound Dressing one or multiple wounds []  - 0 Large Wound Dressing one or multiple wounds X- 1 5 Application of Medications - topical []  - 0 Application of Medications - injection INTERVENTIONS - Miscellaneous []  - 0 External ear exam []  - 0 Specimen Collection (cultures, biopsies, blood, body fluids, etc.) []  - 0 Specimen(s) / Culture(s) sent or taken to Lab for analysis []  - 0 Patient Transfer (multiple staff / Civil Service fast streamer / Similar devices) []  - 0 Simple Staple / Suture removal (25 or less) []  - 0 Complex Staple / Suture removal (26 or more) []  - 0 Hypo / Hyperglycemic Management (close monitor of Blood Glucose) []  - 0 Ankle / Brachial Index (ABI) - do not check if billed separately X- 1 5 Vital Signs Has the patient been seen at the hospital within the last three years: Yes Total Score: 95 Level Of Care: New/Established - Level 3 Electronic Signature(s) Signed: 09/04/2020 5:41:46 PM By: Baruch Gouty RN, BSN Entered By: Baruch Gouty on 09/04/2020 15:49:32 -------------------------------------------------------------------------------- Encounter Discharge Information Details Patient Name: Date of Service: Patrick Shelton 09/04/2020 2:30 PM Medical Record Number: [id number] Patient Account Number: [id number] Date of Birth/Sex: Treating RN: [birth year] (80 y.o. [name] Primary Care Peyten Punches: Altheimer, Juanda Bond  Other Clinician: Referring Trentan Trippe: Treating Aneliz Carbary/Extender: Worthy Keeler Altheimer, Docia Chuck in Treatment: 2 Encounter Discharge Information Items Discharge Condition: Stable Ambulatory Status: Wheelchair Discharge Destination: Home Transportation: Private Auto Accompanied By: spouse Schedule Follow-up Appointment: Yes Clinical Summary of Care: Patient Declined Electronic Signature(s) Signed: 09/04/2020 5:41:46 PM By: Baruch Gouty RN, BSN Entered By: Baruch Gouty on 09/04/2020 16:41:50 -------------------------------------------------------------------------------- Lower Extremity Assessment Details Patient Name: Date of Service: Patrick Shelton 09/04/2020 2:30 PM Medical Record Number: [id number] Patient Account Number: [id number] Date of Birth/Sex: Treating RN: 1940/10/02 (80 y.o. [name] Primary Care Hilda Wexler: Altheimer, Juanda Bond Other Clinician: Referring Parris Cudworth: Treating Quincy Boy/Extender: Worthy Keeler Altheimer, Docia Chuck in Treatment: 2 Edema Assessment Assessed: Shirlyn Goltz: Yes] [Right: No] Edema: [Left: Ye] [Right: s] Calf Left: Right: Point of Measurement: 38 cm From Medial Instep 44 cm Ankle Left: Right: Point of Measurement: 0 cm From Medial Instep 27 cm Vascular Assessment Pulses: Dorsalis Pedis Palpable: [Left:Yes] Electronic Signature(s) Signed: 09/04/2020 5:38:09 PM By: Lorrin Jackson Entered By: Lorrin Jackson on 09/04/2020 15:21:53 -------------------------------------------------------------------------------- Multi-Disciplinary Care Plan Details Patient Name: Date of Service: Patrick Shelton 09/04/2020 2:30 PM Medical Record Number: [id number] Patient Account Number: [id number] Date of Birth/Sex: Treating RN: April 08, 1940 (80 y.o. [name] Primary Care Klarissa Mcilvain: Altheimer, Juanda Bond Other Clinician: Referring Gery Sabedra: Treating Darnella Zeiter/Extender: Worthy Keeler Altheimer, Docia Chuck in  Treatment: 2 Multidisciplinary Care Plan reviewed with physician Active Inactive Nutrition Nursing Diagnoses: Impaired glucose control: actual or potential Potential for alteratiion in Nutrition/Potential for imbalanced nutrition Goals: Patient/caregiver will maintain therapeutic glucose control Date Initiated: 08/21/2020 Target Resolution Date: 09/18/2020 Goal Status: Active Interventions: Assess HgA1c results as ordered upon admission and as needed Provide education on elevated blood sugars and impact on wound healing Treatment Activities: Patient  referred to Primary Care Physician for further nutritional evaluation : 08/21/2020 Notes: Tissue Oxygenation Nursing Diagnoses: Actual ineffective tissue perfusion; peripheral (select once diagnosis is confirmed) Knowledge deficit related to disease process and management Goals: Patient/caregiver will verbalize understanding of disease process and disease management Date Initiated: 08/21/2020 Target Resolution Date: 09/18/2020 Goal Status: Active Interventions: Assess patient understanding of disease process and management upon diagnosis and as needed Assess peripheral arterial status upon admission and as needed Provide education on tissue oxygenation and ischemia Treatment Activities: Revascularization procedures : 08/21/2020 T ordered outside of clinic : 08/21/2020 est Notes: Wound/Skin Impairment Nursing Diagnoses: Impaired tissue integrity Knowledge deficit related to ulceration/compromised skin integrity Goals: Patient/caregiver will verbalize understanding of skin care regimen Date Initiated: 08/21/2020 Target Resolution Date: 09/18/2020 Goal Status: Active Ulcer/skin breakdown will have a volume reduction of 30% by week 4 Date Initiated: 08/21/2020 Target Resolution Date: 09/18/2020 Goal Status: Active Interventions: Assess patient/caregiver ability to obtain necessary supplies Assess patient/caregiver ability to perform  ulcer/skin care regimen upon admission and as needed Assess ulceration(s) every visit Provide education on ulcer and skin care Treatment Activities: Skin care regimen initiated : 08/21/2020 Topical wound management initiated : 08/21/2020 Notes: Electronic Signature(s) Signed: 09/04/2020 5:41:46 PM By: Baruch Gouty RN, BSN Entered By: Baruch Gouty on 09/04/2020 14:53:18 -------------------------------------------------------------------------------- Pain Assessment Details Patient Name: Date of Service: Patrick Shelton 09/04/2020 2:30 PM Medical Record Number: [id number] Patient Account Number: [id number] Date of Birth/Sex: Treating RN: 1940-11-21 (80 y.o. [name] Primary Care Malike Foglio: Altheimer, Juanda Bond Other Clinician: Referring Wallice Granville: Treating Baylynn Shifflett/Extender: Worthy Keeler Altheimer, Docia Chuck in Treatment: 2 Active Problems Location of Pain Severity and Description of Pain Patient Has Paino No Site Locations Pain Management and Medication Current Pain Management: Electronic Signature(s) Signed: 09/04/2020 5:38:09 PM By: Lorrin Jackson Entered By: Lorrin Jackson on 09/04/2020 15:16:29 -------------------------------------------------------------------------------- Patient/Caregiver Education Details Patient Name: Date of Service: Patrick Shelton 6/29/2022andnbsp2:30 PM Medical Record Number: [id number] Patient Account Number: [id number] Date of Birth/Gender: Treating RN: [birth year] (80 y.o. [name] Primary Care Physician: Altheimer, Juanda Bond Other Clinician: Referring Physician: Treating Physician/Extender: Worthy Keeler Altheimer, Docia Chuck in Treatment: 2 Education Assessment Education Provided To: Patient Education Topics Provided Tissue Oxygenation: Methods: Explain/Verbal Responses: Reinforcements needed, State content correctly Wound/Skin Impairment: Methods: Explain/Verbal Responses: Reinforcements  needed, State content correctly Electronic Signature(s) Signed: 09/04/2020 5:41:46 PM By: Baruch Gouty RN, BSN Entered By: Baruch Gouty on 09/04/2020 14:53:51 -------------------------------------------------------------------------------- Wound Assessment Details Patient Name: Date of Service: Patrick Shelton 09/04/2020 2:30 PM Medical Record Number: [id number] Patient Account Number: [id number] Date of Birth/Sex: Treating RN: 1940-05-29 (80 y.o. [name] Primary Care Jamarian Jacinto: Altheimer, Juanda Bond Other Clinician: Referring Estrella Alcaraz: Treating Didier Brandenburg/Extender: Worthy Keeler Altheimer, Docia Chuck in Treatment: 2 Wound Status Wound Number: 1 Primary Diabetic Wound/Ulcer of the Lower Extremity Etiology: Wound Location: Left T Fourth oe Wound Open Wounding Event: Trauma Status: Date Acquired: 05/13/2020 Comorbid Cataracts, Deep Vein Thrombosis, Hypertension, Peripheral Weeks Of Treatment: 2 History: Venous Disease, Type II Diabetes, Gout, Osteoarthritis, Clustered Wound: No Neuropathy, Received Radiation Pending Amputation On Presentation Photos Wound Measurements Length: (cm) 0.9 Width: (cm) 1 Depth: (cm) 0.1 Area: (cm) 0.707 Volume: (cm) 0.071 % Reduction in Area: 50.5% % Reduction in Volume: 50.3% Epithelialization: None Tunneling: No Undermining: No Wound Description Classification: Grade 1 Wound Margin: Distinct, outline attached Exudate Amount: Medium Exudate Type: Serosanguineous Exudate Color: red, brown Foul Odor After Cleansing: No Slough/Fibrino No Wound  Bed Granulation Amount: Small (1-33%) Exposed Structure Granulation Quality: Pink Fascia Exposed: No Necrotic Amount: Large (67-100%) Fat Layer (Subcutaneous Tissue) Exposed: Yes Necrotic Quality: Eschar Tendon Exposed: No Muscle Exposed: No Joint Exposed: No Bone Exposed: No Treatment Notes Wound #1 (Toe Fourth) Wound Laterality: Left Cleanser Peri-Wound  Care Topical Povidone Iodine Swabstick 3 pack, 4(in) Discharge Instruction: paint with betadine Primary Dressing Secondary Dressing Woven Gauze Sponges 2x2 in Discharge Instruction: Apply over primary dressing as directed. Secured With Conforming Stretch Gauze Bandage, Sterile 2x75 (in/in) Discharge Instruction: Secure with stretch gauze as directed. Compression Wrap Compression Stockings Add-Ons Electronic Signature(s) Signed: 09/06/2020 10:25:49 AM By: Sandre Kitty Signed: 09/06/2020 11:28:33 AM By: Lorrin Jackson Previous Signature: 09/04/2020 5:38:09 PM Version By: Lorrin Jackson Entered By: Sandre Kitty on 09/06/2020 08:57:55 -------------------------------------------------------------------------------- Vitals Details Patient Name: Date of Service: Patrick Shelton 09/04/2020 2:30 PM Medical Record Number: [id number] Patient Account Number: [id number] Date of Birth/Sex: Treating RN: 04-20-1940 (80 y.o. [name] Primary Care Aydden Cumpian: Altheimer, Juanda Bond Other Clinician: Referring Jeren Dufrane: Treating Dyllan Kats/Extender: Worthy Keeler Altheimer, Docia Chuck in Treatment: 2 Vital Signs Time Taken: 15:15 Temperature (F): 98.1 Pulse (bpm): 74 Respiratory Rate (breaths/min): 18 Blood Pressure (mmHg): 144/85 Capillary Blood Glucose (mg/dl): 105 Reference Range: 80 - 120 mg / dl Electronic Signature(s) Signed: 09/04/2020 5:38:09 PM By: Lorrin Jackson Entered By: Lorrin Jackson on 09/04/2020 15:16:23

## 2020-09-04 NOTE — Progress Notes (Addendum)
Patrick, Shelton ([id number]) Visit Report for 09/04/2020 Chief Complaint Document Details Patient Name: Date of Service: Patrick Shelton 09/04/2020 2:30 PM Medical Record Number: [id number] Patient Account Number: [id number] Date of Birth/Sex: Treating RN: 11/02/1940 (80 y.o. [name] Primary Care Provider: Altheimer, Juanda Bond Other Clinician: Referring Provider: Treating Provider/Extender: Worthy Keeler Altheimer, Docia Chuck in Treatment: 2 Information Obtained from: Patient Chief Complaint Left 4th toe ulcer Electronic Signature(s) Signed: 09/04/2020 2:34:15 PM By: Worthy Keeler PA-C Entered By: Worthy Keeler on 09/04/2020 14:34:15 -------------------------------------------------------------------------------- HPI Details Patient Name: Date of Service: Patrick Shelton M 09/04/2020 2:30 PM Medical Record Number: [id number] Patient Account Number: [id number] Date of Birth/Sex: Treating RN: [birth year] (80 y.o. [name] Primary Care Provider: Altheimer, Juanda Bond Other Clinician: Referring Provider: Treating Provider/Extender: Worthy Keeler Altheimer, Docia Chuck in Treatment: 2 History of Present Illness HPI Description: 08/21/2020 upon evaluation today patient presents for initial inspection here in the clinic concerning issues that he has been having actually with the wound on his left leg. However upon evaluation by the time he got into see [place] this wound has healed and seems to be doing quite well. With that being said his main issue currently is actually a wound that is on his toe. This is in fact the left fourth toe. 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  Nonetheless he has been seen by podiatry. He has been seen specifically by an stride foot and ankle specialist. It appears that the provider is Dr. Rosemary Holms nonetheless currently she has trimmed his nails but has not done anything as far as debridement in regard to the toe and again I really think that is probably not in his best interest based on the arterial flow. He does see vein and vascular specialist as well and apparently he is not interested whatsoever in proceeding with an arteriogram simply due to the fact that he does have stage III kidney disease and is more concerned about conservative's kidneys instead of potentially risking losing kidney function and possibly still losing the toe or the leg in any way. Either way he tells me that he would "proceed with an amputation in regard to the toe or even leg before I will have a direct procedure to work on the blood flow". He states his kidneys are more important. He is on anticoagulant therapy long-term. This is Eliquis. He is seen with his wife during the office visit today. He does have a history of diabetes mellitus type 2 with a hemoglobin A1c of 8.1 most recent go I do not know the exact time all these records are in [place] which I do not have direct access to. He was a previous smoker but has not smoked for 40 years. 09/04/2020 upon evaluation today patient's toe actually appears to be doing decently well. In fact it is pretty much about the same as what it was previously is most long as of active infection at this time. No fevers, chills, nausea, vomiting, or diarrhea. Patient appears to be showing signs of active infection which is great news. This is staying fairly stable and dry which is  exactly what we want. He does have a follow-up appointment with vascular at the end of July. Electronic Signature(s) Signed: 09/04/2020 3:50:39 PM By: Worthy Keeler PA-C Entered By: Worthy Keeler on 09/04/2020  15:50:39 -------------------------------------------------------------------------------- Physical Exam Details Patient Name: Date of Service: Patrick Shelton 09/04/2020 2:30 PM Medical Record Number: [id number] Patient Account Number: [id number] Date of Birth/Sex: Treating RN: 1941-02-10 (80 y.o. [name] Primary Care Provider: Altheimer, Juanda Bond Other Clinician: Referring Provider: Treating Provider/Extender: Worthy Keeler Altheimer, Docia Chuck in Treatment: 2 Constitutional Well-nourished and well-hydrated in no acute distress. Respiratory normal breathing without difficulty. Psychiatric this patient is able to make decisions and demonstrates good insight into disease process. Alert and Oriented x 3. pleasant and cooperative. Notes Upon inspection patient's wound on his toe was pretty much dry and stable eschar I do not see any signs of active infection which is great news and overall very pleased in that regard. No fevers, chills, nausea, vomiting, or diarrhea. Electronic Signature(s) Signed: 09/04/2020 3:50:57 PM By: Worthy Keeler PA-C Entered By: Worthy Keeler on 09/04/2020 15:50:57 -------------------------------------------------------------------------------- Physician Orders Details Patient Name: Date of Service: Patrick Shelton M 09/04/2020 2:30 PM Medical Record Number: [id number] Patient Account Number: [id number] Date of Birth/Sex: Treating RN: 16-Dec-1940 (80 y.o. [name] Primary Care Provider: Altheimer, Juanda Bond Other Clinician: Referring Provider: Treating Provider/Extender: Worthy Keeler Altheimer, Docia Chuck in Treatment: 2 Verbal / Phone Orders: No Diagnosis Coding ICD-10 Coding Code Description [phone] Type 2 diabetes mellitus with foot ulcer L97.522 Non-pressure chronic ulcer of other part of left foot with fat layer exposed N18.30 Chronic kidney disease, stage 3 unspecified Z79.01 Long term (current) use of  anticoagulants Follow-up Appointments Return Appointment in 2 weeks. Bathing/ Shower/ Hygiene May shower and wash wound with soap and water. Wound Treatment Wound #1 - T Fourth oe Wound Laterality: Left Topical: Povidone Iodine Swabstick 3 pack, 4(in) 1 x Per Day/30 Days Discharge Instructions: paint with betadine Secondary Dressing: Woven Gauze Sponges 2x2 in 1 x Per Day/30 Days Discharge Instructions: Apply over primary dressing as directed. Secured With: Child psychotherapist, Sterile 2x75 (in/in) 1 x Per Day/30 Days Discharge Instructions: Secure with stretch gauze as directed. Electronic Signature(s) Signed: 09/04/2020 4:36:36 PM By: Worthy Keeler PA-C Signed: 09/04/2020 5:41:46 PM By: Baruch Gouty RN, BSN Entered By: Baruch Gouty on 09/04/2020 15:48:59 -------------------------------------------------------------------------------- Problem List Details Patient Name: Date of Service: Patrick Shelton M 09/04/2020 2:30 PM Medical Record Number: [id number] Patient Account Number: [id number] Date of Birth/Sex: Treating RN: 1941-01-30 (80 y.o. [name] Primary Care Provider: Altheimer, Juanda Bond Other Clinician: Referring Provider: Treating Provider/Extender: Worthy Keeler Altheimer, Docia Chuck in Treatment: 2 Active Problems ICD-10 Encounter Code Description Active Date MDM Diagnosis E11.621 Type 2 diabetes mellitus with foot ulcer 08/21/2020 No Yes L97.522 Non-pressure chronic ulcer of other part of left foot with fat layer exposed 08/21/2020 No Yes N18.30 Chronic kidney disease, stage 3 unspecified 08/21/2020 No Yes Z79.01 Long term (current) use of anticoagulants 08/21/2020 No Yes Inactive Problems Resolved Problems Electronic Signature(s) Signed: 09/04/2020 2:34:06 PM By: Worthy Keeler PA-C Entered By: Worthy Keeler on 09/04/2020 14:34:05 -------------------------------------------------------------------------------- Progress Note  Details Patient Name: Date of Service: Patrick Shelton M 09/04/2020 2:30 PM Medical Record Number: [id number] Patient Account Number: [id number] Date of Birth/Sex: Treating RN: 03-09-1941 (80 y.o. [name] Primary Care Provider: Altheimer, Juanda Bond Other Clinician: Referring Provider:  Treating Provider/Extender: Worthy Keeler Altheimer, Docia Chuck in Treatment: 2 Subjective Chief Complaint Information obtained from Patient Left 4th toe ulcer History of Present Illness (HPI) 08/21/2020 upon evaluation today patient presents for initial inspection here in the clinic concerning issues that he has been having actually with the wound on his left leg. However upon evaluation by the time he got into see [place] this wound has healed and seems to be doing quite well. With that being said his main issue currently is actually a wound that is on his toe. This is in fact the left fourth toe. 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 Nonetheless he has been seen by podiatry. He has been seen specifically by an stride foot and ankle specialist. It appears that the provider is Dr. Rosemary Holms nonetheless currently she has trimmed his nails but has not done anything as far as debridement in regard to the toe and again I really think that is probably not in his best interest based on the arterial flow. He does see vein and vascular specialist as well and apparently he is not interested whatsoever in proceeding with an arteriogram simply due to the fact that he does have stage III kidney disease and is more concerned about conservative's kidneys instead of potentially risking losing kidney function and possibly still  losing the toe or the leg in any way. Either way he tells me that he would "proceed with an amputation in regard to the toe or even leg before I will have a direct procedure to work on the blood flow". He states his kidneys are more important. He is on anticoagulant therapy long-term. This is Eliquis. He is seen with his wife during the office visit today. He does have a history of diabetes mellitus type 2 with a hemoglobin A1c of 8.1 most recent go I do not know the exact time all these records are in [place] which I do not have direct access to. He was a previous smoker but has not smoked for 40 years. 09/04/2020 upon evaluation today patient's toe actually appears to be doing decently well. In fact it is pretty much about the same as what it was previously is most long as of active infection at this time. No fevers, chills, nausea, vomiting, or diarrhea. Patient appears to be showing signs of active infection which is great news. This is staying fairly stable and dry which is exactly what we want. He does have a follow-up appointment with vascular at the end of July. Objective Constitutional Well-nourished and well-hydrated in no acute distress. Vitals Time Taken: 3:15 PM, Temperature: 98.1 F, Pulse: 74 bpm, Respiratory Rate: 18 breaths/min, Blood Pressure: 144/85 mmHg, Capillary Blood Glucose: 105 mg/dl. Respiratory normal breathing without difficulty. Psychiatric this patient is able to make decisions and demonstrates good insight into disease process. Alert and Oriented x 3. pleasant and cooperative. General Notes: Upon inspection patient's wound on his toe was pretty much dry and stable eschar I do not see any signs of active infection which is great news and overall very pleased in that regard. No fevers, chills, nausea, vomiting, or diarrhea. Integumentary (Hair, Skin) Wound #1 status is Open.  Original cause of wound was Trauma. The date acquired was: 05/13/2020. The wound has been in  treatment 2 weeks. The wound is located on the Left T Fourth. The wound measures 0.9cm length x 1cm width x 0.1cm depth; 0.707cm^2 area and 0.071cm^3 volume. There is Fat Layer oe (Subcutaneous Tissue) exposed. There is no tunneling or undermining noted. There is a medium amount of serosanguineous drainage noted. The wound margin is distinct with the outline attached to the wound base. There is small (1-33%) pink granulation within the wound bed. There is a large (67-100%) amount of necrotic tissue within the wound bed including Eschar. Assessment Active Problems ICD-10 Type 2 diabetes mellitus with foot ulcer Non-pressure chronic ulcer of other part of left foot with fat layer exposed Chronic kidney disease, stage 3 unspecified Long term (current) use of anticoagulants Plan Follow-up Appointments: Return Appointment in 2 weeks. Bathing/ Shower/ Hygiene: May shower and wash wound with soap and water. WOUND #1: - T Fourth Wound Laterality: Left oe Topical: Povidone Iodine Swabstick 3 pack, 4(in) 1 x Per Day/30 Days Discharge Instructions: paint with betadine Secondary Dressing: Woven Gauze Sponges 2x2 in 1 x Per Day/30 Days Discharge Instructions: Apply over primary dressing as directed. Secured With: Child psychotherapist, Sterile 2x75 (in/in) 1 x Per Day/30 Days Discharge Instructions: Secure with stretch gauze as directed. 1. I would recommend that we going to continue with the wound care measures as before and the patient is in agreement with plan. This includes the use of the Betadine followed by dry gauze dressing I think that is the ideal way to handle this currently. 2. I did recommend the patient continue to monitor for any signs of worsening such as softening, drainage, erythema, or pain which is increasing. If any this occurs he needs to let me know soon as possible. He definitely knows he does not want to go forward with any type of intervention with regard to his  leg due to the dye that would be necessary and he does not want affect his kidney. With that being said he states he would go for an amputation of this leg before he would go down that road. I am hopeful however this will stay stable and dry maybe we can get it to slowly over time he will we shall see. Definitely this is going to be a ongoing issue that we have to monitor at least on occasion for now [place] plan to see him in 2 weeks and then we will Go from there. We will see patient back for reevaluation in 2 weeks here in the clinic. If anything worsens or changes patient will contact our office for additional recommendations. Electronic Signature(s) Signed: 09/04/2020 3:52:14 PM By: Worthy Keeler PA-C Entered By: Worthy Keeler on 09/04/2020 15:52:13 -------------------------------------------------------------------------------- SuperBill Details Patient Name: Date of Service: Patrick Shelton 09/04/2020 Medical Record Number: [id number] Patient Account Number: [id number] Date of Birth/Sex: Treating RN: 03-24-1940 (80 y.o. [name] Primary Care Provider: Altheimer, Juanda Bond Other Clinician: Referring Provider: Treating Provider/Extender: Worthy Keeler Altheimer, Docia Chuck in Treatment: 2 Diagnosis Coding ICD-10 Codes Code Description [id number] Type 2 diabetes mellitus with foot ulcer L97.522 Non-pressure chronic ulcer of other part of left foot with fat layer exposed N18.30 Chronic kidney disease, stage 3 unspecified Z79.01 Long term (current) use of anticoagulants Facility Procedures CPT4 Code: [id number] Description: 99213 - WOUND CARE VISIT-LEV 3 EST PT Modifier: Quantity: 1 Physician Procedures : CPT4 Code Description Modifier  [id number] - WC PHYS LEVEL 3 - EST PT ICD-10 Diagnosis Description E11.621 Type 2 diabetes mellitus with foot ulcer L97.522 Non-pressure chronic ulcer of other part of left foot with fat layer exposed N18.30 Chronic  kidney disease,  stage 3 unspecified Z79.01 Long term (current) use of anticoagulants Quantity: 1 Electronic Signature(s) Signed: 09/04/2020 3:52:25 PM By: Worthy Keeler PA-C Entered By: Worthy Keeler on 09/04/2020 15:52:24

## 2020-09-05 DIAGNOSIS — H26491 Other secondary cataract, right eye: Secondary | ICD-10-CM | POA: Diagnosis not present

## 2020-09-05 DIAGNOSIS — Z794 Long term (current) use of insulin: Secondary | ICD-10-CM | POA: Diagnosis not present

## 2020-09-05 DIAGNOSIS — E113593 Type 2 diabetes mellitus with proliferative diabetic retinopathy without macular edema, bilateral: Secondary | ICD-10-CM | POA: Diagnosis not present

## 2020-09-05 DIAGNOSIS — Z961 Presence of intraocular lens: Secondary | ICD-10-CM | POA: Diagnosis not present

## 2020-09-18 ENCOUNTER — Encounter (HOSPITAL_BASED_OUTPATIENT_CLINIC_OR_DEPARTMENT_OTHER): Payer: Medicare Other | Attending: Physician Assistant | Admitting: Physician Assistant

## 2020-09-18 ENCOUNTER — Other Ambulatory Visit: Payer: Self-pay

## 2020-09-18 DIAGNOSIS — L97822 Non-pressure chronic ulcer of other part of left lower leg with fat layer exposed: Secondary | ICD-10-CM | POA: Diagnosis not present

## 2020-09-18 DIAGNOSIS — I872 Venous insufficiency (chronic) (peripheral): Secondary | ICD-10-CM | POA: Diagnosis not present

## 2020-09-18 DIAGNOSIS — E11621 Type 2 diabetes mellitus with foot ulcer: Secondary | ICD-10-CM | POA: Diagnosis not present

## 2020-09-18 DIAGNOSIS — L97522 Non-pressure chronic ulcer of other part of left foot with fat layer exposed: Secondary | ICD-10-CM | POA: Insufficient documentation

## 2020-09-18 DIAGNOSIS — Z89511 Acquired absence of right leg below knee: Secondary | ICD-10-CM | POA: Diagnosis not present

## 2020-09-18 DIAGNOSIS — Z87891 Personal history of nicotine dependence: Secondary | ICD-10-CM | POA: Insufficient documentation

## 2020-09-18 DIAGNOSIS — I890 Lymphedema, not elsewhere classified: Secondary | ICD-10-CM | POA: Insufficient documentation

## 2020-09-18 DIAGNOSIS — E1122 Type 2 diabetes mellitus with diabetic chronic kidney disease: Secondary | ICD-10-CM | POA: Diagnosis not present

## 2020-09-18 DIAGNOSIS — L97529 Non-pressure chronic ulcer of other part of left foot with unspecified severity: Secondary | ICD-10-CM | POA: Diagnosis present

## 2020-09-18 DIAGNOSIS — N183 Chronic kidney disease, stage 3 unspecified: Secondary | ICD-10-CM | POA: Diagnosis not present

## 2020-09-18 DIAGNOSIS — Z7901 Long term (current) use of anticoagulants: Secondary | ICD-10-CM | POA: Diagnosis not present

## 2020-09-18 NOTE — Progress Notes (Addendum)
Patrick Shelton ([id number]) Visit Report for 09/18/2020 Arrival Information Details Patient Name: Date of Service: Patrick Shelton 09/18/2020 3:00 PM Medical Record Number: [id number] Patient Account Number: [id number] Date of Birth/Sex: Treating RN: [birth year] (80 y.o. Patrick Shelton Primary Care Calle Schader: Altheimer, Juanda Bond Other Clinician: Referring Dajai Wahlert: Treating Kalayla Shadden/Extender: Worthy Keeler Altheimer, Docia Chuck in Treatment: 4 Visit Information History Since Last Visit Added or deleted any medications: No Patient Arrived: Wheel Chair Any new allergies or adverse reactions: No Arrival Time: 15:21 Had a fall or experienced change in No Accompanied By: spouse activities of daily living that may affect Transfer Assistance: Manual risk of falls: Patient Identification Verified: Yes Signs or symptoms of abuse/neglect since last visito No Secondary Verification Process Completed: Yes Hospitalized since last visit: No Patient Requires Transmission-Based Precautions: No Implantable device outside of the clinic excluding No Patient Has Alerts: Yes cellular tissue based products placed in the center Patient Alerts: Patient on Blood Thinner since last visit: L ABI=[place] L TBI=0.34 Has Dressing in Place as Prescribed: Yes Pain Present Now: No Electronic Signature(s) Signed: 09/18/2020 6:05:01 PM By: Lorrin Jackson Entered By: Lorrin Jackson on 09/18/2020 15:24:13 -------------------------------------------------------------------------------- Encounter Discharge Information Details Patient Name: Date of Service: Patrick Shelton 09/18/2020 3:00 PM Medical Record Number: [id number] Patient Account Number: [id number] Date of Birth/Sex: Treating RN: 03-Jun-1940 (80 y.o. Patrick Shelton Primary Care Everest Brod: Altheimer, Juanda Bond Other Clinician: Referring Landra Howze: Treating Therasa Lorenzi/Extender: Worthy Keeler Altheimer, Docia Chuck in Treatment: 4 Encounter  Discharge Information Items Post Procedure Vitals Discharge Condition: Stable Temperature (F): 97.9 Ambulatory Status: Ambulatory Pulse (bpm): 76 Discharge Destination: Home Respiratory Rate (breaths/min): 20 Transportation: Private Auto Blood Pressure (mmHg): 133/84 Accompanied By: self Schedule Follow-up Appointment: Yes Clinical Summary of Care: Electronic Signature(s) Signed: 09/18/2020 6:29:13 PM By: Deon Pilling Entered By: Deon Pilling on 09/18/2020 16:53:23 -------------------------------------------------------------------------------- Lower Extremity Assessment Details Patient Name: Date of Service: Patrick Shelton 09/18/2020 3:00 PM Medical Record Number: [id number] Patient Account Number: [id number] Date of Birth/Sex: Treating RN: 02-23-1941 (80 y.o. Patrick Shelton Primary Care Detrice Cales: Altheimer, Juanda Bond Other Clinician: Referring Yahye Siebert: Treating Leroi Haque/Extender: Worthy Keeler Altheimer, Docia Chuck in Treatment: 4 Edema Assessment Assessed: Shirlyn Goltz: Yes] Patrice Paradise: No] Edema: [Left: Ye] [Right: s] Calf Left: Right: Point of Measurement: 38 cm From Medial Instep 44.5 cm Ankle Left: Right: Point of Measurement: 0 cm From Medial Instep 28 cm Vascular Assessment Pulses: Dorsalis Pedis Palpable: [Left:Yes] Electronic Signature(s) Signed: 09/18/2020 6:05:01 PM By: Lorrin Jackson Entered By: Lorrin Jackson on 09/18/2020 15:36:39 -------------------------------------------------------------------------------- Multi Wound Chart Details Patient Name: Date of Service: Patrick Shelton 09/18/2020 3:00 PM Medical Record Number: [id number] Patient Account Number: [id number] Date of Birth/Sex: Treating RN: 02-05-1941 (80 y.o. Patrick Shelton Primary Care Ladarrian Asencio: Altheimer, Juanda Bond Other Clinician: Referring Amandine Covino: Treating Dyron Kawano/Extender: Worthy Keeler Altheimer, Docia Chuck in Treatment: 4 Vital Signs Height(in): Capillary  Blood Glucose(mg/dl): 126 Weight(lbs): Pulse(bpm): 81 Body Mass Index(BMI): Blood Pressure(mmHg): 133/84 Temperature(F): 97.9 Respiratory Rate(breaths/min): 20 Photos: [1:No Photos Left T Fourth oe] [2:No Photos Left, Lateral Lower Leg] [N/A:N/A N/A] Wound Location: [1:Trauma] [2:Blister] [N/A:N/A] Wounding Event: [1:Diabetic Wound/Ulcer of the Lower] [2:Venous Leg Ulcer] [N/A:N/A] Primary Etiology: [1:Extremity Cataracts, Deep Vein Thrombosis,] [2:Cataracts, Deep Vein Thrombosis,] [N/A:N/A] Comorbid History: [1:Hypertension, Peripheral Venous Disease, Type II Diabetes, Gout, Osteoarthritis, Neuropathy, Received Radiation 05/13/2020] [2:Hypertension, Peripheral Venous Disease, Type II Diabetes, Gout, Osteoarthritis, Neuropathy, Received  Radiation 09/13/2020] [N/A:N/A] Date Acquired: [1:4] [2:0] [  N/A:N/A] Weeks of Treatment: [1:Open] [2:Open] [N/A:N/A] Wound Status: [1:Yes] [2:No] [N/A:N/A] Pending A mputation on Presentation: [1:0.9x1x0.1] [2:2.5x2x0.1] [N/A:N/A] Measurements L x W x D (cm) [1:0.707] [2:3.927] [N/A:N/A] A (cm) : rea [1:0.071] [2:0.393] [N/A:N/A] Volume (cm) : [1:50.50%] [2:N/A] [N/A:N/A] % Reduction in A rea: [1:50.30%] [2:N/A] [N/A:N/A] % Reduction in Volume: [1:Grade 1] [2:Full Thickness Without Exposed] [N/A:N/A] Classification: [1:Medium] [2:Support Structures Medium] [N/A:N/A] Exudate A mount: [1:Serosanguineous] [2:Serous] [N/A:N/A] Exudate Type: [1:red, brown] [2:amber] [N/A:N/A] Exudate Color: [1:Distinct, outline attached] [2:Distinct, outline attached] [N/A:N/A] Wound Margin: [1:Small (1-33%)] [2:Large (67-100%)] [N/A:N/A] Granulation A mount: [1:Pink] [2:Red] [N/A:N/A] Granulation Quality: [1:Large (67-100%)] [2:None Present (0%)] [N/A:N/A] Necrotic A mount: [1:Eschar] [2:N/A] [N/A:N/A] Necrotic Tissue: [1:Fat Layer (Subcutaneous Tissue): Yes Fat Layer (Subcutaneous Tissue): Yes N/A] Exposed Structures: [1:Fascia: No Tendon: No Muscle: No Joint: No  Bone: No Medium (34-66%)] [2:Fascia: No Tendon: No Muscle: No Joint: No Bone: No None] [N/A:N/A] Epithelialization: [1:N/A] [2:Chemical/Enzymatic/Mechanical -] [N/A:N/A] Debridement: [2:Selective/Open Wound] Pre-procedure Verification/Time Out N/A [2:15:45] [N/A:N/A] Taken: [1:N/A] [2:Other] [N/A:N/A] Pain Control: [1:N/A] [2:Other(Gauze)] [N/A:N/A] Instrument: [1:N/A] [2:None] [N/A:N/A] Bleeding: [1:N/A] [2:Procedure was tolerated well] [N/A:N/A] Debridement Treatment Response: [1:N/A] [2:2.5x2x0.1] [N/A:N/A] Post Debridement Measurements L x W x D (cm) [1:N/A] [2:0.393] [N/A:N/A] Post Debridement Volume: (cm) [1:N/A] [2:Debridement] [N/A:N/A] Treatment Notes Electronic Signature(s) Signed: 09/18/2020 4:00:08 PM By: Lorrin Jackson Entered By: Lorrin Jackson on 09/18/2020 16:00:08 -------------------------------------------------------------------------------- Multi-Disciplinary Care Plan Details Patient Name: Date of Service: Patrick Shelton 09/18/2020 3:00 PM Medical Record Number: [id number] Patient Account Number: [id number] Date of Birth/Sex: Treating RN: 07-20-1940 (80 y.o. Patrick Shelton Primary Care Darci Lykins: Altheimer, Juanda Bond Other Clinician: Referring Vielka Klinedinst: Treating Kadajah Kjos/Extender: Worthy Keeler Altheimer, Docia Chuck in Treatment: 4 [place] reviewed with physician Active Inactive Nutrition Nursing Diagnoses: Impaired glucose control: actual or potential Potential for alteratiion in Nutrition/Potential for imbalanced nutrition Goals: Patient/caregiver will maintain therapeutic glucose control Date Initiated: 08/21/2020 Target Resolution Date: 10/23/2020 Goal Status: Active Interventions: Assess HgA1c results as ordered upon admission and as needed Provide education on elevated blood sugars and impact on wound healing Treatment Activities: Patient referred to Primary Care Physician for further nutritional evaluation :  08/21/2020 Notes: 09/18/20: Glucose control ongoing Tissue Oxygenation Nursing Diagnoses: Actual ineffective tissue perfusion; peripheral (select once diagnosis is confirmed) Knowledge deficit related to disease process and management Goals: Patient/caregiver will verbalize understanding of disease process and disease management Date Initiated: 08/21/2020 Target Resolution Date: 10/23/2020 Goal Status: Active Interventions: Assess patient understanding of disease process and management upon diagnosis and as needed Assess peripheral arterial status upon admission and as needed Provide education on tissue oxygenation and ischemia Treatment Activities: Revascularization procedures : 08/21/2020 T ordered outside of clinic : 08/21/2020 est Notes: Wound/Skin Impairment Nursing Diagnoses: Impaired tissue integrity Knowledge deficit related to ulceration/compromised skin integrity Goals: Patient/caregiver will verbalize understanding of skin care regimen Date Initiated: 08/21/2020 Date Inactivated: 09/18/2020 Target Resolution Date: 09/18/2020 Goal Status: Met Ulcer/skin breakdown will have a volume reduction of 30% by week 4 Date Initiated: 08/21/2020 Target Resolution Date: 10/02/2020 Goal Status: Active Interventions: Assess patient/caregiver ability to obtain necessary supplies Assess patient/caregiver ability to perform ulcer/skin care regimen upon admission and as needed Assess ulceration(s) every visit Provide education on ulcer and skin care Treatment Activities: Skin care regimen initiated : 08/21/2020 Topical wound management initiated : 08/21/2020 Notes: 09/18/20: New wound today, target date extended. Electronic Signature(s) Signed: 09/18/2020 6:05:01 PM By: Lorrin Jackson Entered By: Lorrin Jackson on 09/18/2020 15:37:54 -------------------------------------------------------------------------------- Pain Assessment Details Patient Name: Date of  Service: Patrick Shelton  09/18/2020 3:00 PM Medical Record Number: [id number] Patient Account Number: [id number] Date of Birth/Sex: Treating RN: 03/29/1940 (80 y.o. Patrick Shelton Primary Care Khila Papp: Altheimer, Juanda Bond Other Clinician: Referring Money Mckeithan: Treating Chalise Pe/Extender: Worthy Keeler Altheimer, Docia Chuck in Treatment: 4 Active Problems Location of Pain Severity and Description of Pain Patient Has Paino No Site Locations Pain Management and Medication Current Pain Management: Electronic Signature(s) Signed: 09/18/2020 6:05:01 PM By: Lorrin Jackson Entered By: Lorrin Jackson on 09/18/2020 15:24:23 -------------------------------------------------------------------------------- Patient/Caregiver Education Details Patient Name: Date of Service: Patrick Shelton 7/13/2022andnbsp3:00 PM Medical Record Number: [id number] Patient Account Number: [id number] Date of Birth/Gender: Treating RN: 10/20/1940 (80 y.o. Patrick Shelton Primary Care Physician: Altheimer, Juanda Bond Other Clinician: Referring Physician: Treating Physician/Extender: Worthy Keeler Altheimer, Docia Chuck in Treatment: 4 Education Assessment Education Provided To: Patient Education Topics Provided Elevated Blood Sugar/ Impact on Healing: Methods: Explain/Verbal Responses: State content correctly Wound/Skin Impairment: Methods: Explain/Verbal, Printed Responses: State content correctly Electronic Signature(s) Signed: 09/18/2020 6:05:01 PM By: Lorrin Jackson Entered By: Lorrin Jackson on 09/18/2020 15:41:51 -------------------------------------------------------------------------------- Wound Assessment Details Patient Name: Date of Service: Patrick Shelton 09/18/2020 3:00 PM Medical Record Number: [id number] Patient Account Number: [id number] Date of Birth/Sex: Treating RN: [birth year] (80 y.o. Patrick Shelton Primary Care Jermar Colter: Altheimer, Juanda Bond Other Clinician: Referring  Claborn Janusz: Treating Khye Hochstetler/Extender: Worthy Keeler Altheimer, Docia Chuck in Treatment: 4 Wound Status Wound Number: 1 Primary Diabetic Wound/Ulcer of the Lower Extremity Etiology: Wound Location: Left T Fourth oe Wound Open Wounding Event: Trauma Status: Date Acquired: 05/13/2020 Comorbid Cataracts, Deep Vein Thrombosis, Hypertension, Peripheral Venous Weeks Of Treatment: 4 History: Disease, Type II Diabetes, Gout, Osteoarthritis, Neuropathy, Clustered Wound: No Received Radiation Pending Amputation On Presentation Photos Wound Measurements Length: (cm) 0.9 Width: (cm) 1 Depth: (cm) 0.1 Area: (cm) 0.707 Volume: (cm) 0.071 % Reduction in Area: 50.5% % Reduction in Volume: 50.3% Epithelialization: Medium (34-66%) Tunneling: No Undermining: No Wound Description Classification: Grade 1 Wound Margin: Distinct, outline attached Exudate Amount: Medium Exudate Type: Serosanguineous Exudate Color: red, brown Foul Odor After Cleansing: No Slough/Fibrino No Wound Bed Granulation Amount: Small (1-33%) Exposed Structure Granulation Quality: Pink Fascia Exposed: No Necrotic Amount: Large (67-100%) Fat Layer (Subcutaneous Tissue) Exposed: Yes Necrotic Quality: Eschar Tendon Exposed: No Muscle Exposed: No Joint Exposed: No Bone Exposed: No Treatment Notes Wound #1 (Toe Fourth) Wound Laterality: Left Cleanser Soap and Water Discharge Instruction: May shower and wash wound with dial antibacterial soap and water prior to dressing change. Wound Cleanser Discharge Instruction: Cleanse the wound with wound cleanser prior to applying a clean dressing using gauze sponges, not tissue or cotton balls. Peri-Wound Care Topical Povidone Iodine Swabstick 3 pack, 4(in) Discharge Instruction: paint with betadine Primary Dressing Secondary Dressing Woven Gauze Sponges 2x2 in Discharge Instruction: Apply over primary dressing as directed. Secured With Conforming Stretch Gauze  Bandage, Sterile 2x75 (in/in) Discharge Instruction: Secure with stretch gauze as directed. Compression Wrap Compression Stockings Add-Ons Notes left leg 4 layer compression wrap with lotion. Electronic Signature(s) Signed: 09/19/2020 6:01:46 PM By: Lorrin Jackson Signed: 09/23/2020 3:49:49 PM By: Sandre Kitty Previous Signature: 09/18/2020 6:05:01 PM Version By: Lorrin Jackson Entered By: Sandre Kitty on 09/19/2020 10:04:38 -------------------------------------------------------------------------------- Wound Assessment Details Patient Name: Date of Service: Patrick Shelton 09/18/2020 3:00 PM Medical Record Number: [id number] Patient Account Number: [id number] Date of Birth/Sex: Treating RN: 01-Apr-1940 (80 y.o. Patrick Shelton Primary Care Shekinah Pitones: Altheimer, Legrand Como  D Other Clinician: Referring Essie Lagunes: Treating Ryken Paschal/Extender: Worthy Keeler Altheimer, Docia Chuck in Treatment: 4 Wound Status Wound Number: 2 Primary Venous Leg Ulcer Etiology: Wound Location: Left, Lateral Lower Leg Wound Open Wounding Event: Blister Status: Date Acquired: 09/13/2020 Comorbid Cataracts, Deep Vein Thrombosis, Hypertension, Peripheral Venous Weeks Of Treatment: 0 History: Disease, Type II Diabetes, Gout, Osteoarthritis, Neuropathy, Clustered Wound: No Received Radiation Photos Wound Measurements Length: (cm) 2.5 Width: (cm) 2 Depth: (cm) 0.1 Area: (cm) 3.927 Volume: (cm) 0.393 Wound Description Classification: Full Thickness Without Exposed Support Structures Wound Margin: Distinct, outline attached Exudate Amount: Medium Exudate Type: Serous Exudate Color: amber Foul Odor After Cleansing: Slough/Fibrino % Reduction in Area: 0% % Reduction in Volume: 0% Epithelialization: None Tunneling: No Undermining: No No No Wound Bed Granulation Amount: Large (67-100%) Exposed Structure Granulation Quality: Red Fascia Exposed: No Necrotic Amount: None Present  (0%) Fat Layer (Subcutaneous Tissue) Exposed: Yes Tendon Exposed: No Muscle Exposed: No Joint Exposed: No Bone Exposed: No Treatment Notes Wound #2 (Lower Leg) Wound Laterality: Left, Lateral Cleanser Soap and Water Discharge Instruction: May shower and wash wound with dial antibacterial soap and water prior to dressing change. Wound Cleanser Discharge Instruction: Cleanse the wound with wound cleanser prior to applying a clean dressing using gauze sponges, not tissue or cotton balls. Peri-Wound Care Topical Primary Dressing KerraCel Ag Gelling Fiber Dressing, 4x5 in (silver alginate) Discharge Instruction: Apply silver alginate to wound bed as instructed Secondary Dressing Zetuvit Plus Silicone Border Dressing 4x4 (in/in) Discharge Instruction: Apply silicone border over primary dressing as directed. Secured With Compression Wrap Compression Stockings Add-Ons Notes left leg 4 layer compression wrap with lotion. Electronic Signature(s) Signed: 09/19/2020 6:01:46 PM By: Lorrin Jackson Signed: 09/23/2020 3:49:49 PM By: Sandre Kitty Previous Signature: 09/18/2020 6:05:01 PM Version By: Lorrin Jackson Entered By: Sandre Kitty on 09/19/2020 10:08:15 -------------------------------------------------------------------------------- Vitals Details Patient Name: Date of Service: Patrick Shelton 09/18/2020 3:00 PM Medical Record Number: [id number] Patient Account Number: [id number] Date of Birth/Sex: Treating RN: 18-Mar-1940 (80 y.o. Patrick Shelton Primary Care Ryleah Miramontes: Altheimer, Juanda Bond Other Clinician: Referring Dearies Meikle: Treating Aayla Marrocco/Extender: Worthy Keeler Altheimer, Docia Chuck in Treatment: 4 Vital Signs Time Taken: 15:26 Temperature (F): 97.9 Pulse (bpm): 76 Respiratory Rate (breaths/min): 20 Blood Pressure (mmHg): 133/84 Capillary Blood Glucose (mg/dl): 126 Reference Range: 80 - 120 mg / dl Electronic Signature(s) Signed: 09/18/2020 6:05:01 PM  By: Lorrin Jackson Entered By: Lorrin Jackson on 09/18/2020 15:27:57

## 2020-09-18 NOTE — Progress Notes (Addendum)
Patrick Shelton, Patrick Shelton ([id number]) Visit Report for 09/18/2020 Chief Complaint Document Details Patient Name: Date of Service: Patrick Shelton 09/18/2020 3:00 PM Medical Record Number: [id number] Patient Account Number: [id number] Date of Birth/Sex: Treating RN: 04-23-1940 (80 y.o. Patrick Shelton: Patrick Shelton Other Clinician: Referring Shelton: Treating Shelton/Extender: Patrick Shelton Patrick Shelton in Treatment: 4 Information Obtained from: Patient Chief Complaint Left 4th toe ulcer and left LE Ulcer Electronic Signature(s) Signed: 09/18/2020 3:58:53 PM By: Patrick Keeler PA-C Previous Signature: 09/18/2020 3:17:45 PM Version By: Patrick Keeler PA-C Entered By: Patrick Shelton on 09/18/2020 15:58:53 -------------------------------------------------------------------------------- Debridement Details Patient Name: Date of Service: Patrick Shelton 09/18/2020 3:00 PM Medical Record Number: [id number] Patient Account Number: [id number] Date of Birth/Sex: Treating RN: March 08, 1941 (80 y.o. Patrick Shelton: Patrick Shelton Other Clinician: Referring Shelton: Treating Shelton/Extender: Patrick Shelton Patrick Shelton in Treatment: 4 Debridement Performed for Assessment: Wound #2 Left,Lateral Lower Leg Performed By: Physician Patrick Keeler, PA Debridement Type: Chemical/Enzymatic/Mechanical Agent Used: saline and gauze Severity of Tissue Pre Debridement: Fat layer exposed Level of Consciousness (Pre-procedure): Awake and Alert Pre-procedure Verification/Time Out Yes - 15:45 Taken: Start Time: 15:46 Pain Control: Other : Benzocaine Instrument: Other : Gauze Bleeding: None End Time: 15:50 Response to Treatment: Procedure was tolerated well Level of Consciousness (Post- Awake and Alert procedure): Post Debridement Measurements of Total Wound Length: (cm) 2.5 Width: (cm) 2 Depth: (cm)  0.1 Volume: (cm) 0.393 Character of Wound/Ulcer Post Debridement: Stable Severity of Tissue Post Debridement: Fat layer exposed Post Procedure Diagnosis Same as Pre-procedure Electronic Signature(s) Signed: 09/18/2020 3:55:47 PM By: Patrick Keeler PA-C Signed: 09/18/2020 6:05:01 PM By: Lorrin Jackson Entered By: Patrick Shelton on 09/18/2020 15:55:46 -------------------------------------------------------------------------------- HPI Details Patient Name: Date of Service: Patrick Shelton 09/18/2020 3:00 PM Medical Record Number: [id number] Patient Account Number: [id number] Date of Birth/Sex: Treating RN: [birth year] (80 y.o. Patrick Shelton: Patrick Shelton Other Clinician: Referring Shelton: Treating Shelton/Extender: Patrick Shelton Patrick Shelton in Treatment: 4 History of Present Illness HPI Description: 08/21/2020 upon evaluation today patient presents for initial inspection here in the clinic concerning issues that he has been having actually with the wound on his left leg. However upon evaluation by the time he got into see [place] this wound has healed and seems to be doing quite well. With that being said his main issue currently is actually a wound that is on his toe. This is in fact the left fourth toe. 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 Nonetheless he has been seen by podiatry. He has been seen specifically by an stride foot and ankle specialist. It appears that the Shelton is Dr. Rosemary Holms nonetheless currently she has trimmed his nails but has not done anything as far as debridement in regard to the toe and again I really think that is probably not  in his best interest based on the arterial flow. He does see vein and vascular specialist as well and apparently he is not interested whatsoever in proceeding with an arteriogram simply due  to the fact that he does have stage III kidney disease and is more concerned about conservative's kidneys instead of potentially risking losing kidney function and possibly still losing the toe or the leg in any way. Either way he tells me that he would "proceed with an amputation in regard to the toe or even leg before I will have a direct procedure to work on the blood flow". He states his kidneys are more important. He is on anticoagulant therapy long-term. This is Eliquis. He is seen with his wife during the office visit today. He does have a history of diabetes mellitus type 2 with a hemoglobin A1c of 8.1 most recent go I do not know the exact time all these records are in [place] which I do not have direct access to. He was a previous smoker but has not smoked for 40 years. 09/04/2020 upon evaluation today patient's toe actually appears to be doing decently well. In fact it is pretty much about the same as what it was previously is most long as of active infection at this time. No fevers, chills, nausea, vomiting, or diarrhea. Patient appears to be showing signs of active infection which is great news. This is staying fairly stable and dry which is exactly what we want. He does have a follow-up appointment with vascular at the end of July. 09/18/2020 upon evaluation today patient actually appears to be doing quite well in regard to his wounds. He has been tolerating the dressing changes without complication. The toe actually is looking better and I think the Betadine is helping to dry this up quite nicely. With that being said he still is having a significant amount of issues with his leg on the left. Specifically he has a blister currently he sees vascular next week for now I am really not going to be wrapping  him until we see where things stand from a vascular standpoint and ensure that he is still doing okay. Electronic Signature(s) Signed: 09/18/2020 3:53:35 PM By: Patrick Keeler PA-C Entered By: Patrick Shelton on 09/18/2020 15:53:35 -------------------------------------------------------------------------------- Physical Exam Details Patient Name: Date of Service: Patrick Shelton 09/18/2020 3:00 PM Medical Record Number: [id number] Patient Account Number: [id number] Date of Birth/Sex: Treating RN: [birth year] (80 y.o. Patrick Shelton: Patrick Shelton Other Clinician: Referring Shelton: Treating Shelton/Extender: Patrick Shelton Patrick Shelton in Treatment: 4 Constitutional Well-nourished and well-hydrated in no acute distress. Respiratory normal breathing without difficulty. Psychiatric this patient is able to make decisions and demonstrates good insight into disease process. Alert and Oriented x 3. pleasant and cooperative. Notes Upon inspection patient's wound on the foot specifically the fourth toe seems to be doing quite well on the left foot. This had a lot of dry skin that peeled off quite easily around the edges of the wound today and this was great news. With that being said I do not see any signs of infection and in general I am very pleased with where we stand currently. Unfortunately he did have a small blister on the lateral portion of the left leg I think that right now would not connect compression wrap him although I think that would be beneficial this is not quite safe at this point. Unfortunately we do not have anything such as Tubigrip. Electronic Signature(s) Signed: 09/18/2020 3:54:15 PM By: Patrick Keeler PA-C Entered By: Patrick Shelton on 09/18/2020 15:54:15 -------------------------------------------------------------------------------- Physician Orders Details Patient Name: Date of Service: BRO Jenetta Downer  Faythe Ghee Shelton  09/18/2020 3:00 PM Medical Record Number: [id number] Patient Account Number: [id number] Date of Birth/Sex: Treating RN: 1940-08-06 (80 y.o. Patrick Shelton: Patrick Shelton Other Clinician: Referring Shelton: Treating Shelton/Extender: Patrick Shelton Patrick Shelton in Treatment: 4 Verbal / Phone Orders: No Diagnosis Coding ICD-10 Coding Code Description [phone] Type 2 diabetes mellitus with foot ulcer L97.522 Non-pressure chronic ulcer of other part of left foot with fat layer exposed N18.30 Chronic kidney disease, stage 3 unspecified Z79.01 Long term (current) use of anticoagulants Follow-up Appointments Return A ppointment in 2 weeks. Other: - [facility]=Supplies Bathing/ Shower/ Hygiene May shower and wash wound with soap and water. Additional Orders / Instructions Follow Nutritious Diet Wound Treatment Wound #1 - T Fourth oe Wound Laterality: Left Cleanser: Soap and Water 1 x Per Day/30 Days Discharge Instructions: May shower and wash wound with dial antibacterial soap and water prior to dressing change. Cleanser: Wound Cleanser 1 x Per Day/30 Days Discharge Instructions: Cleanse the wound with wound cleanser prior to applying a clean dressing using gauze sponges, not tissue or cotton balls. Topical: Povidone Iodine Swabstick 3 pack, 4(in) 1 x Per Day/30 Days Discharge Instructions: paint with betadine Secondary Dressing: Woven Gauze Sponges 2x2 in 1 x Per Day/30 Days Discharge Instructions: Apply over primary dressing as directed. Secured With: Child psychotherapist, Sterile 2x75 (in/in) 1 x Per Day/30 Days Discharge Instructions: Secure with stretch gauze as directed. Wound #2 - Lower Leg Wound Laterality: Left, Lateral Cleanser: Soap and Water 3 x Per Week/15 Days Discharge Instructions: May shower and wash wound with dial antibacterial soap and water prior to dressing change. Cleanser: Wound Cleanser 3 x Per Week/15  Days Discharge Instructions: Cleanse the wound with wound cleanser prior to applying a clean dressing using gauze sponges, not tissue or cotton balls. Prim Dressing: KerraCel Ag Gelling Fiber Dressing, 4x5 in (silver alginate) 3 x Per Week/15 Days ary Discharge Instructions: Apply silver alginate to wound bed as instructed Secondary Dressing: Zetuvit Plus Silicone Border Dressing 4x4 (in/in) 3 x Per Week/15 Days Discharge Instructions: Apply silicone border over primary dressing as directed. Electronic Signature(s) Signed: 09/18/2020 5:19:59 PM By: Patrick Keeler PA-C Signed: 09/18/2020 6:05:01 PM By: Lorrin Jackson Entered By: Lorrin Jackson on 09/18/2020 15:53:07 -------------------------------------------------------------------------------- Problem List Details Patient Name: Date of Service: Patrick Shelton 09/18/2020 3:00 PM Medical Record Number: [id number] Patient Account Number: [id number] Date of Birth/Sex: Treating RN: [birth year] (79 y.o. Patrick Shelton: Patrick Shelton Other Clinician: Referring Shelton: Treating Shelton/Extender: Patrick Shelton Patrick Shelton in Treatment: 4 Active Problems ICD-10 Encounter Code Description Active Date MDM Diagnosis E11.621 Type 2 diabetes mellitus with foot ulcer 08/21/2020 No Yes L97.522 Non-pressure chronic ulcer of other part of left foot with fat layer exposed 08/21/2020 No Yes I89.0 Lymphedema, not elsewhere classified 09/18/2020 No Yes L97.822 Non-pressure chronic ulcer of other part of left lower leg with fat layer exposed7/13/2022 No Yes N18.30 Chronic kidney disease, stage 3 unspecified 08/21/2020 No Yes Z79.01 Long term (current) use of anticoagulants 08/21/2020 No Yes Inactive Problems Resolved Problems Electronic Signature(s) Signed: 09/18/2020 3:58:40 PM By: Patrick Keeler PA-C Previous Signature: 09/18/2020 3:17:37 PM Version By: Patrick Keeler PA-C Entered By: Patrick Shelton  on 09/18/2020 15:58:39 -------------------------------------------------------------------------------- Progress Note Details Patient Name: Date of Service: Patrick Shelton 09/18/2020 3:00 PM Medical Record Number: [id number] Patient Account Number: [id number] Date of Birth/Sex: Treating RN: 1940/04/15 (  80 y.o. Patrick Shelton: Patrick Shelton Other Clinician: Referring Shelton: Treating Shelton/Extender: Patrick Shelton Patrick Shelton in Treatment: 4 Subjective Chief Complaint Information obtained from Patient Left 4th toe ulcer History of Present Illness (HPI) 08/21/2020 upon evaluation today patient presents for initial inspection here in the clinic concerning issues that he has been having actually with the wound on his left leg. However upon evaluation by the time he got into see [place] this wound has healed and seems to be doing quite well. With that being said his main issue currently is actually a wound that is on his toe. This is in fact the left fourth toe. 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 Nonetheless he has been seen by podiatry. He has been seen specifically by an stride foot and ankle specialist. It appears that the Shelton is Dr. Rosemary Holms nonetheless currently she has trimmed his nails but has not done anything as far as debridement in regard to the toe and again I really think that is probably not in his best interest based on the arterial flow. He does see vein and vascular specialist as well and apparently he is not interested whatsoever in proceeding with an arteriogram simply due to the fact that he does have stage III kidney disease and is  more concerned about conservative's kidneys instead of potentially risking losing kidney function and possibly still losing the toe or the leg in any way. Either way he tells me that he would "proceed with an amputation in regard to the toe or even leg before I will have a direct procedure to work on the blood flow". He states his kidneys are more important. He is on anticoagulant therapy long-term. This is Eliquis. He is seen with his wife during the office visit today. He does have a history of diabetes mellitus type 2 with a hemoglobin A1c of 8.1 most recent go I do not know the exact time all these records are in [place] which I do not have direct access to. He was a previous smoker but has not smoked for 40 years. 09/04/2020 upon evaluation today patient's toe actually appears to be doing decently well. In fact it is pretty much about the same as what it was previously is most long as of active infection at this time. No fevers, chills, nausea, vomiting, or diarrhea. Patient appears to be showing signs of active infection which is great news. This is staying fairly stable and dry which is exactly what we want. He does have a follow-up appointment with vascular at the end of July. 09/18/2020 upon evaluation today patient actually appears to be doing quite well in regard to his wounds. He has been tolerating the dressing changes without complication. The toe actually is looking better and I think the Betadine is helping to dry this up quite nicely. With that being said he still is having a significant amount of issues with his leg on the left. Specifically he has a blister currently he sees vascular next week for now I am really not going to be wrapping him until we see where things stand from a vascular standpoint and  ensure that he is still doing okay. Objective Constitutional Well-nourished and well-hydrated in no acute distress. Vitals Time Taken: 3:26 PM, Temperature: 97.9 F, Pulse: 76 bpm,  Respiratory Rate: 20 breaths/min, Blood Pressure: 133/84 mmHg, Capillary Blood Glucose: 126 mg/dl. Respiratory normal breathing without difficulty. Psychiatric this patient is able to make decisions and demonstrates good insight into disease process. Alert and Oriented x 3. pleasant and cooperative. General Notes: Upon inspection patient's wound on the foot specifically the fourth toe seems to be doing quite well on the left foot. This had a lot of dry skin that peeled off quite easily around the edges of the wound today and this was great news. With that being said I do not see any signs of infection and in general I am very pleased with where we stand currently. Unfortunately he did have a small blister on the lateral portion of the left leg I think that right now would not connect compression wrap him although I think that would be beneficial this is not quite safe at this point. Unfortunately we do not have anything such as Tubigrip. Integumentary (Hair, Skin) Wound #1 status is Open. Original cause of wound was Trauma. The date acquired was: 05/13/2020. The wound has been in treatment 4 weeks. The wound is located on the Left T Fourth. The wound measures 0.9cm length x 1cm width x 0.1cm depth; 0.707cm^2 area and 0.071cm^3 volume. There is Fat Layer oe (Subcutaneous Tissue) exposed. There is no tunneling or undermining noted. There is a medium amount of serosanguineous drainage noted. The wound margin is distinct with the outline attached to the wound base. There is small (1-33%) pink granulation within the wound bed. There is a large (67-100%) amount of necrotic tissue within the wound bed including Eschar. Wound #2 status is Open. Original cause of wound was Blister. The date acquired was: 09/13/2020. The wound is located on the Left,Lateral Lower Leg. The wound measures 2.5cm length x 2cm width x 0.1cm depth; 3.927cm^2 area and 0.393cm^3 volume. There is Fat Layer (Subcutaneous Tissue) exposed.  There is no tunneling or undermining noted. There is a medium amount of serous drainage noted. The wound margin is distinct with the outline attached to the wound base. There is large (67-100%) red granulation within the wound bed. There is no necrotic tissue within the wound bed. 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 There was a Selective/Open Wound Skin/Dermis Debridement with a total area of 5 sq cm performed by Patrick Keeler, PA. With the following instrument(s): Gauze to remove Non-Viable tissue/material. Material removed includes Skin: Dermis after achieving pain control using Other (Benzocaine). No specimens were taken. A time out was conducted at 15:45, prior to the start of the procedure. There was no bleeding. The procedure was tolerated well. Post Debridement Measurements: 2.5cm length x 2cm width x 0.1cm depth; 0.393cm^3 volume. Character of Wound/Ulcer Post Debridement is stable. Severity of Tissue Post Debridement is: Fat layer exposed. Post procedure Diagnosis Wound #2: Same as Pre-Procedure Plan Follow-up Appointments: Return Appointment in 2 weeks. Other: - [facility]=Supplies Bathing/ Shower/ Hygiene: May shower and wash wound with soap and water. Additional Orders / Instructions: Follow Nutritious  Diet WOUND #1: - T Fourth Wound Laterality: Left oe Cleanser: Soap and Water 1 x Per Day/30 Days Discharge Instructions: May shower and wash wound with dial antibacterial soap and water prior to dressing change. Cleanser: Wound Cleanser 1 x Per Day/30 Days Discharge Instructions: Cleanse the wound with wound  cleanser prior to applying a clean dressing using gauze sponges, not tissue or cotton balls. Topical: Povidone Iodine Swabstick 3 pack, 4(in) 1 x Per Day/30 Days Discharge Instructions: paint with betadine Secondary Dressing: Woven Gauze Sponges 2x2 in 1 x Per Day/30 Days Discharge Instructions: Apply over primary dressing as directed. Secured With: Child psychotherapist, Sterile 2x75 (in/in) 1 x Per Day/30 Days Discharge Instructions: Secure with stretch gauze as directed. WOUND #2: - Lower Leg Wound Laterality: Left, Lateral Cleanser: Soap and Water 3 x Per Week/15 Days Discharge Instructions: May shower and wash wound with dial antibacterial soap and water prior to dressing change. Cleanser: Wound Cleanser 3 x Per Week/15 Days Discharge Instructions: Cleanse the wound with wound cleanser prior to applying a clean dressing using gauze sponges, not tissue or cotton balls. Prim Dressing: KerraCel Ag Gelling Fiber Dressing, 4x5 in (silver alginate) 3 x Per Week/15 Days ary Discharge Instructions: Apply silver alginate to wound bed as instructed Secondary Dressing: Zetuvit Plus Silicone Border Dressing 4x4 (in/in) 3 x Per Week/15 Days Discharge Instructions: Apply silicone border over primary dressing as directed. 1. Would recommend currently that we going continue with the Betadine to the fourth toe left foot. I think this is doing a great job. Cover with dry gauze dressing. 2. Also can recommend that we use a silver alginate dressing followed by a border foam dressing or Zetuvit silicone border dressing in order to take care of this area. I think this needs to be changed 3 times a week. 3. I am also going to recommend the patient continue to monitor for any signs of worsening from the standpoint of infection right now I see no issues but if anything changes he should let me know. We will see patient back for reevaluation in 1 week here in the clinic. If anything worsens or changes  patient will contact our office for additional recommendations. Electronic Signature(s) Signed: 09/18/2020 3:54:59 PM By: Patrick Keeler PA-C Entered By: Patrick Shelton on 09/18/2020 15:54:58 -------------------------------------------------------------------------------- SuperBill Details Patient Name: Date of Service: Patrick Shelton 09/18/2020 Medical Record Number: [id number] Patient Account Number: [id number] Date of Birth/Sex: Treating RN: 12-14-1940 (80 y.o. Patrick Shelton: Patrick Shelton Other Clinician: Referring Shelton: Treating Shelton/Extender: Patrick Shelton Patrick Shelton in Treatment: 4 Diagnosis Coding ICD-10 Codes Code Description [phone] Type 2 diabetes mellitus with foot ulcer L97.522 Non-pressure chronic ulcer of other part of left foot with fat layer exposed I89.0 Lymphedema, not elsewhere classified L97.822 Non-pressure chronic ulcer of other part of left lower leg with fat layer exposed N18.30 Chronic kidney disease, stage 3 unspecified Z79.01 Long term (current) use of anticoagulants Facility Procedures CPT4 Code: [id number] Description: [phone] - DEBRIDE W/O ANES NON SELECT ICD-10 Diagnosis Description L97.822 Non-pressure chronic ulcer of other part of left lower leg with fat layer expos Modifier: ed Quantity: 1 Physician Procedures : CPT4 Code Description Modifier [id number] - WC PHYS LEVEL 4 - EST PT ICD-10 Diagnosis Description L97.822 Non-pressure chronic ulcer of other part of left lower leg with fat layer exposed L97.522 Non-pressure chronic ulcer of other part of left foot  with fat layer exposed Quantity:  1 Electronic Signature(s) Signed: 09/18/2020 4:57:25 PM By: Lorrin Jackson Signed: 09/18/2020 5:19:59 PM By: Patrick Keeler PA-C Entered By: Lorrin Jackson on 09/18/2020 16:57:24

## 2020-09-25 ENCOUNTER — Ambulatory Visit (INDEPENDENT_AMBULATORY_CARE_PROVIDER_SITE_OTHER): Payer: Medicare Other | Admitting: Vascular Surgery

## 2020-09-25 ENCOUNTER — Other Ambulatory Visit: Payer: Self-pay

## 2020-09-25 ENCOUNTER — Encounter: Payer: Self-pay | Admitting: Vascular Surgery

## 2020-09-25 VITALS — BP 160/83 | HR 83 | Temp 97.6°F | Resp 16 | Ht 70.0 in | Wt 260.0 lb

## 2020-09-25 DIAGNOSIS — I779 Disorder of arteries and arterioles, unspecified: Secondary | ICD-10-CM

## 2020-09-25 DIAGNOSIS — Z89511 Acquired absence of right leg below knee: Secondary | ICD-10-CM | POA: Diagnosis not present

## 2020-09-25 NOTE — Progress Notes (Signed)
Patient name: Patrick Shelton MRN: [id number] DOB: September 11, 1940 Sex: male  REASON FOR VISIT:   Follow-up of peripheral vascular disease.  HPI:   Patrick Shelton is a pleasant 80 y.o. 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  He had evidence of infrainguinal arterial occlusive disease on the left.  I recommended arteriography to see what options he might have for revascularization.  He was reluctant to consider this.  I explained that this could become a limb threatening problem.  He did not have any erythema or drainage however.  He comes in for a 6 week follow-up visit.  Since I saw him last, he states there has been no change to the wound on his left fourth toe.  He has a small wound which is very superficial on the lateral aspect of the left leg.  He is followed at the wound care center every 2 weeks.  He really has no pain associated with the wounds.  He has a BKA on the right.  He is on Eliquis.  Current Outpatient Medications  Medication Sig Dispense Refill   allopurinol (ZYLOPRIM) 100 MG tablet      amLODipine (NORVASC) 5 MG tablet TAKE 1 TABLET(5 MG) BY MOUTH DAILY 90 tablet 3   apixaban (ELIQUIS) 5 MG TABS tablet Take 1 tablet (5 mg total) by mouth 2 (two) times daily. 180 tablet 1   B Complex Vitamins (B COMPLEX PO) Take 1 tablet by mouth daily.      Cholecalciferol (VITAMIN D-3) 5000 UNITS TABS Take 5,000 Units by mouth daily.      CONTOUR NEXT TEST test strip TEST BLOOD SUGAR LEVELS QID     ezetimibe (ZETIA) 10 MG tablet Take 5 mg by mouth daily.     guaiFENesin (MUCINEX) 600 MG 12 hr tablet Take 600 mg by mouth 2 (two) times daily.     hydrochlorothiazide 25 MG tablet Take 25 mg by mouth daily.     HYDROcodone-acetaminophen (NORCO/VICODIN) 5-325 MG tablet Take 1 tablet every 8 hours as needed for diabetic neuropathy pain     losartan (COZAAR) 100 MG tablet      NOVOLOG 100 UNIT/ML injection Inject into the skin  continuous. Via insulin pump  1   pravastatin (PRAVACHOL) 40 MG tablet Take 40 mg by mouth daily.     VOLTAREN 1 % GEL      No current facility-administered medications for this visit.    REVIEW OF SYSTEMS:  [X]  denotes positive finding, [ ]  denotes negative finding Vascular    Leg swelling    Cardiac    Chest pain or chest pressure:    Shortness of breath upon exertion:    Short of breath when lying flat:    Irregular heart rhythm:    Constitutional    Fever or chills:     PHYSICAL EXAM:   Vitals:   09/25/20 1514  BP: (!) 160/83  Pulse: 83  Resp: 16  Temp: 97.6 F (36.4 C)  TempSrc: Temporal  SpO2: 92%  Weight: 260 lb (117.9 kg)  Height: 5\' 10"  (1.778 m)    GENERAL: The patient is a well-nourished male, in no acute distress. The vital signs are documented above. CARDIOVASCULAR: There is a regular rate and rhythm. PULMONARY: There is good air exchange bilaterally without wheezing or rales. VASCULAR: On the left side I cannot palpate popliteal or pedal pulses. He has the dry gangrene of the left fourth  toe as documented below.  He has a superficial ulceration on the lateral aspect of the left leg as documented below.      DATA:   No new data  MEDICAL ISSUES:   PERIPHERAL VASCULAR DISEASE WITH DRY GANGRENE LEFT FOURTH TOE: The left fourth toe wound is stable.  The wound on his lateral leg is fairly superficial and has good granulation tissue.  We again discussed the option of proceeding with arteriography.  He is very reluctant to consider this as he is concerned about the dye injuring his kidneys.  He tells me that if the wounds progressed he would rather have an amputation and risk affecting his kidneys.  He is given this a lot of thought and feels very comfortable with this decision.  Therefore at this point we will not proceed with arteriography unless he changes his mind.  I have ordered follow-up arterial Doppler studies in 6 months and I will see him back at  that time.  He knows to call sooner if he has problems.  He will continue to follow up with the wound care center.  Deitra Mayo [facility] [phone]

## 2020-09-27 ENCOUNTER — Other Ambulatory Visit: Payer: Self-pay

## 2020-09-27 DIAGNOSIS — I779 Disorder of arteries and arterioles, unspecified: Secondary | ICD-10-CM

## 2020-10-02 ENCOUNTER — Encounter (HOSPITAL_BASED_OUTPATIENT_CLINIC_OR_DEPARTMENT_OTHER): Payer: Medicare Other | Admitting: Physician Assistant

## 2020-10-02 ENCOUNTER — Other Ambulatory Visit: Payer: Self-pay

## 2020-10-02 DIAGNOSIS — L97822 Non-pressure chronic ulcer of other part of left lower leg with fat layer exposed: Secondary | ICD-10-CM | POA: Diagnosis not present

## 2020-10-02 DIAGNOSIS — N183 Chronic kidney disease, stage 3 unspecified: Secondary | ICD-10-CM | POA: Diagnosis not present

## 2020-10-02 DIAGNOSIS — Z87891 Personal history of nicotine dependence: Secondary | ICD-10-CM | POA: Diagnosis not present

## 2020-10-02 DIAGNOSIS — E11621 Type 2 diabetes mellitus with foot ulcer: Secondary | ICD-10-CM | POA: Diagnosis not present

## 2020-10-02 DIAGNOSIS — L97522 Non-pressure chronic ulcer of other part of left foot with fat layer exposed: Secondary | ICD-10-CM | POA: Diagnosis not present

## 2020-10-02 DIAGNOSIS — E1122 Type 2 diabetes mellitus with diabetic chronic kidney disease: Secondary | ICD-10-CM | POA: Diagnosis not present

## 2020-10-02 NOTE — Progress Notes (Addendum)
Patrick Shelton ([id number]) Visit Report for 10/02/2020 Chief Complaint Document Details Patient Name: Date of Service: Patrick Shelton 10/02/2020 3:00 PM Medical Record Number: [id number] Patient Account Number: [id number] Date of Birth/Sex: Treating RN: [birth year] (80 y.o. [name] Primary Care Provider: Altheimer, Juanda Bond Other Clinician: Referring Provider: Treating Provider/Extender: Worthy Keeler Altheimer, Docia Chuck in Treatment: 6 Information Obtained from: Patient Chief Complaint Left 4th toe ulcer and left LE Ulcer Electronic Signature(s) Signed: 10/02/2020 3:04:23 PM By: Worthy Keeler PA-C Entered By: Worthy Keeler on 10/02/2020 15:04:23 -------------------------------------------------------------------------------- HPI Details Patient Name: Date of Service: Patrick Shelton 10/02/2020 3:00 PM Medical Record Number: [id number] Patient Account Number: [id number] Date of Birth/Sex: Treating RN: 09-Aug-1940 (80 y.o. [name] Primary Care Provider: Altheimer, Juanda Bond Other Clinician: Referring Provider: Treating Provider/Extender: Worthy Keeler Altheimer, Docia Chuck in Treatment: 6 History of Present Illness HPI Description: 08/21/2020 upon evaluation today patient presents for initial inspection here in the clinic concerning issues that he has been having actually with the wound on his left leg. However upon evaluation by the time he got into see [place] this wound has healed and seems to be doing quite well. With that being said his main issue currently is actually a wound that is on his toe. This is in fact the left fourth toe. 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 Nonetheless he has been seen by podiatry. He has been seen specifically by an stride foot and ankle specialist. It appears that the provider is Dr. Rosemary Holms nonetheless currently she has trimmed his nails but has not done anything as far as debridement in regard to the toe and again I really think that is probably not in his best interest based on the arterial flow. He does see vein and vascular specialist as well and apparently he is not interested whatsoever in proceeding with an arteriogram simply due to the fact that he does have stage III kidney disease and is more concerned about conservative's kidneys instead of potentially risking losing kidney function and possibly still losing the toe or the leg in any way. Either way he tells me that he would "proceed with an amputation in regard to the toe or even leg before I will have a direct procedure to work on the blood flow". He states his kidneys are more important. He is on anticoagulant therapy long-term. This is Eliquis. He is seen with his wife during the office visit today. He does have a history of diabetes mellitus type 2 with a hemoglobin A1c of 8.1 most recent go I do not know the exact time all these records are in [place] which I do not have direct access to. He was a previous smoker but has not smoked for 40 years. 09/04/2020 upon evaluation today patient's toe actually appears to be doing decently well. In fact it is pretty much about the same as what it was previously is most long as of active infection at this time. No fevers, chills, nausea, vomiting, or diarrhea. Patient appears to be showing signs of active infection which is great news. This is staying fairly stable  and dry which is exactly what we want. He does have a follow-up appointment with vascular at the end of July. 09/18/2020 upon evaluation today patient actually appears to be doing quite well in regard to his wounds. He has been tolerating the dressing changes  without complication. The toe actually is looking better and I think the Betadine is helping to dry this up quite nicely. With that being said he still is having a significant amount of issues with his leg on the left. Specifically he has a blister currently he sees vascular next week for now I am really not going to be wrapping him until we see where things stand from a vascular standpoint and ensure that he is still doing okay. 10/02/2008 patient presents today for follow-up concerning his leg and toe ulcer. The leg is actually healed which is great news. The toe is still dry and stable some of the eschar starting to peel off a little bit more but in general overall things seem to be doing quite well. He did see Dr. Doren Custard on 09/25/2020. Dr. Doren Custard offered an arteriogram but the patient wanted. His kidneys in the trouble there so he does not want to proceed with any type of intervention as far as an arteriogram is concerned. With that being said for that reason we will go ahead and continue to manage this just on a outpatient basis and the patient is much more pleased with this. Electronic Signature(s) Signed: 10/02/2020 4:24:22 PM By: Worthy Keeler PA-C Entered By: Worthy Keeler on 10/02/2020 16:24:22 -------------------------------------------------------------------------------- Physical Exam Details Patient Name: Date of Service: Patrick Shelton 10/02/2020 3:00 PM Medical Record Number: [id number] Patient Account Number: [id number] Date of Birth/Sex: Treating RN: [birth year] (80 y.o. [name] Primary Care Provider: Altheimer, Juanda Bond Other Clinician: Referring Provider: Treating Provider/Extender: Worthy Keeler Altheimer, Docia Chuck in Treatment: 6 Constitutional Well-nourished and well-hydrated in no acute distress. Respiratory normal breathing without difficulty. Psychiatric this patient is able to make decisions and demonstrates good insight into disease  process. Alert and Oriented x 3. pleasant and cooperative. Notes Upon inspection patient's wound bed actually showed signs of good granulation epithelization at this point. There does not appear to be any signs of infection which is great news and overall I am actually very pleased with where things stand today. No fevers, chills, nausea, vomiting, or diarrhea. Electronic Signature(s) Signed: 10/02/2020 4:24:39 PM By: Worthy Keeler PA-C Entered By: Worthy Keeler on 10/02/2020 16:24:38 -------------------------------------------------------------------------------- Physician Orders Details Patient Name: Date of Service: Patrick Shelton 10/02/2020 3:00 PM Medical Record Number: [id number] Patient Account Number: [id number] Date of Birth/Sex: Treating RN: January 06, 1941 (80 y.o. [name] Primary Care Provider: Altheimer, Juanda Bond Other Clinician: Referring Provider: Treating Provider/Extender: Worthy Keeler Altheimer, Docia Chuck in Treatment: 6 Verbal / Phone Orders: No Diagnosis Coding ICD-10 Coding Code Description [phone] Type 2 diabetes mellitus with foot ulcer L97.522 Non-pressure chronic ulcer of other part of left foot with fat layer exposed I89.0 Lymphedema, not elsewhere classified L97.822 Non-pressure chronic ulcer of other part of left lower leg with fat layer exposed N18.30 Chronic kidney disease, stage 3 unspecified Z79.01 Long term (current) use of anticoagulants Follow-up Appointments Return appointment in 3 weeks. Other: - [facility]=Supplies Bathing/ Shower/ Hygiene May shower and wash wound with soap and water. Edema Control - Lymphedema / SCD / Other Left Lower Extremity Elevate legs to the level of the heart or above for 30  minutes daily and/or when sitting, a frequency of: Moisturize legs daily. Compression stocking or Garment 10-20 mm/Hg pressure to: - to left leg daily, open toe knee high Additional Orders / Instructions Follow Nutritious  Diet Wound Treatment Wound #1 - T Fourth oe Wound Laterality: Left Cleanser: Soap and Water 1 x Per Day/30 Days Discharge Instructions: May shower and wash wound with dial antibacterial soap and water prior to dressing change. Cleanser: Wound Cleanser 1 x Per Day/30 Days Discharge Instructions: Cleanse the wound with wound cleanser prior to applying a clean dressing using gauze sponges, not tissue or cotton balls. Topical: Povidone Iodine Swabstick 3 pack, 4(in) 1 x Per Day/30 Days Discharge Instructions: paint with betadine Secondary Dressing: Woven Gauze Sponges 2x2 in 1 x Per Day/30 Days Discharge Instructions: Apply over primary dressing as directed. Secured With: Child psychotherapist, Sterile 2x75 (in/in) 1 x Per Day/30 Days Discharge Instructions: Secure with stretch gauze as directed. Electronic Signature(s) Signed: 10/02/2020 4:30:28 PM By: Worthy Keeler PA-C Signed: 10/02/2020 5:32:13 PM By: Baruch Gouty RN, BSN Entered By: Baruch Gouty on 10/02/2020 16:15:59 -------------------------------------------------------------------------------- Problem List Details Patient Name: Date of Service: Patrick Shelton 10/02/2020 3:00 PM Medical Record Number: [id number] Patient Account Number: [id number] Date of Birth/Sex: Treating RN: [birth year] (80 y.o. [name] Primary Care Provider: Altheimer, Juanda Bond Other Clinician: Referring Provider: Treating Provider/Extender: Worthy Keeler Altheimer, Docia Chuck in Treatment: 6 Active Problems ICD-10 Encounter Code Description Active Date MDM Diagnosis E11.621 Type 2 diabetes mellitus with foot ulcer 08/21/2020 No Yes L97.522 Non-pressure chronic ulcer of other part of left foot with fat layer exposed 08/21/2020 No Yes I89.0 Lymphedema, not elsewhere classified 09/18/2020 No Yes L97.822 Non-pressure chronic ulcer of other part of left lower leg with fat layer exposed7/13/2022 No Yes N18.30 Chronic  kidney disease, stage 3 unspecified 08/21/2020 No Yes Z79.01 Long term (current) use of anticoagulants 08/21/2020 No Yes Inactive Problems Resolved Problems Electronic Signature(s) Signed: 10/02/2020 3:04:13 PM By: Worthy Keeler PA-C Entered By: Worthy Keeler on 10/02/2020 15:04:13 -------------------------------------------------------------------------------- Progress Note Details Patient Name: Date of Service: Patrick Shelton 10/02/2020 3:00 PM Medical Record Number: [id number] Patient Account Number: [id number] Date of Birth/Sex: Treating RN: June 22, 1940 (80 y.o. [name] Primary Care Provider: Altheimer, Juanda Bond Other Clinician: Referring Provider: Treating Provider/Extender: Worthy Keeler Altheimer, Docia Chuck in Treatment: 6 Subjective Chief Complaint Information obtained from Patient Left 4th toe ulcer and left LE Ulcer History of Present Illness (HPI) 08/21/2020 upon evaluation today patient presents for initial inspection here in the clinic concerning issues that he has been having actually with the wound on his left leg. However upon evaluation by the time he got into see [place] this wound has healed and seems to be doing quite well. With that being said his main issue currently is actually a wound that is on his toe. This is in fact the left fourth toe. 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 Nonetheless he has been seen by podiatry. He has been seen specifically by an stride foot and ankle specialist. It appears that the provider is Dr. Rosemary Holms nonetheless currently she  has trimmed his nails but has not done anything as far as debridement in regard to the toe and again I  really think that is probably not in his best interest based on the arterial flow. He does see vein and vascular specialist as well and apparently he is not interested whatsoever in proceeding with an arteriogram simply due to the fact that he does have stage III kidney disease and is more concerned about conservative's kidneys instead of potentially risking losing kidney function and possibly still losing the toe or the leg in any way. Either way he tells me that he would "proceed with an amputation in regard to the toe or even leg before I will have a direct procedure to work on the blood flow". He states his kidneys are more important. He is on anticoagulant therapy long-term. This is Eliquis. He is seen with his wife during the office visit today. He does have a history of diabetes mellitus type 2 with a hemoglobin A1c of 8.1 most recent go I do not know the exact time all these records are in [place] which I do not have direct access to. He was a previous smoker but has not smoked for 40 years. 09/04/2020 upon evaluation today patient's toe actually appears to be doing decently well. In fact it is pretty much about the same as what it was previously is most long as of active infection at this time. No fevers, chills, nausea, vomiting, or diarrhea. Patient appears to be showing signs of active infection which is great news. This is staying fairly stable and dry which is exactly what we want. He does have a follow-up appointment with vascular at the end of July. 09/18/2020 upon evaluation today patient actually appears to be doing quite well in regard to his wounds. He has been tolerating the dressing changes without complication. The toe actually is looking better and I think the Betadine is helping to dry this up quite nicely. With that being said he still is having a significant amount of issues with his leg on the left. Specifically he has a blister currently he sees vascular next week for now I am  really not going to be wrapping him until we see where things stand from a vascular standpoint and ensure that he is still doing okay. 10/02/2008 patient presents today for follow-up concerning his leg and toe ulcer. The leg is actually healed which is great news. The toe is still dry and stable some of the eschar starting to peel off a little bit more but in general overall things seem to be doing quite well. He did see Dr. Doren Custard on 09/25/2020. Dr. Doren Custard offered an arteriogram but the patient wanted. His kidneys in the trouble there so he does not want to proceed with any type of intervention as far as an arteriogram is concerned. With that being said for that reason we will go ahead and continue to manage this just on a outpatient basis and the patient is much more pleased with this. Objective Constitutional Well-nourished and well-hydrated in no acute distress. Vitals Time Taken: 3:33 PM, Temperature: 97.9 F, Pulse: 74 bpm, Respiratory Rate: 20 breaths/min, Blood Pressure: 165/77 mmHg, Capillary Blood Glucose: 70 mg/dl. Respiratory normal breathing without difficulty. Psychiatric this patient is able to make decisions and demonstrates good insight into disease process. Alert and Oriented x 3. pleasant and cooperative. General Notes: Upon inspection patient's wound bed actually showed signs of good granulation epithelization at this point. There does not  appear to be any signs of infection which is great news and overall I am actually very pleased with where things stand today. No fevers, chills, nausea, vomiting, or diarrhea. Integumentary (Hair, Skin) Wound #1 status is Open. Original cause of wound was Trauma. The date acquired was: 05/13/2020. The wound has been in treatment 6 weeks. The wound is located on the Left T Fourth. The wound measures 1.4cm length x 1.8cm width x 0.1cm depth; 1.979cm^2 area and 0.198cm^3 volume. There is Fat Layer oe (Subcutaneous Tissue) exposed. There is no  tunneling or undermining noted. There is a medium amount of serosanguineous drainage noted. The wound margin is distinct with the outline attached to the wound base. There is no granulation within the wound bed. There is a large (67-100%) amount of necrotic tissue within the wound bed including Eschar. Wound #2 status is Open. Original cause of wound was Blister. The date acquired was: 09/13/2020. The wound has been in treatment 2 weeks. The wound is located on the Left,Lateral Lower Leg. The wound measures 0cm length x 0cm width x 0cm depth; 0cm^2 area and 0cm^3 volume. There is Fat Layer (Subcutaneous Tissue) exposed. There is a medium amount of serous drainage noted. The wound margin is distinct with the outline attached to the wound base. There is large (67-100%) red granulation within the wound bed. There is no necrotic tissue within the wound bed. Assessment Active Problems ICD-10 Type 2 diabetes mellitus with foot ulcer Non-pressure chronic ulcer of other part of left foot with fat layer exposed Lymphedema, not elsewhere classified Non-pressure chronic ulcer of other part of left lower leg with fat layer exposed Chronic kidney disease, stage 3 unspecified Long term (current) use of anticoagulants Plan Follow-up Appointments: Return appointment in 3 weeks. Other: - [facility]=Supplies Bathing/ Shower/ Hygiene: May shower and wash wound with soap and water. Edema Control - Lymphedema / SCD / Other: Elevate legs to the level of the heart or above for 30 minutes daily and/or when sitting, a frequency of: Moisturize legs daily. Compression stocking or Garment 10-20 mm/Hg pressure to: - to left leg daily, open toe knee high Additional Orders / Instructions: Follow Nutritious Diet WOUND #1: - T Fourth Wound Laterality: Left oe Cleanser: Soap and Water 1 x Per Day/30 Days Discharge Instructions: May shower and wash wound with dial antibacterial soap and water prior to dressing  change. Cleanser: Wound Cleanser 1 x Per Day/30 Days Discharge Instructions: Cleanse the wound with wound cleanser prior to applying a clean dressing using gauze sponges, not tissue or cotton balls. Topical: Povidone Iodine Swabstick 3 pack, 4(in) 1 x Per Day/30 Days Discharge Instructions: paint with betadine Secondary Dressing: Woven Gauze Sponges 2x2 in 1 x Per Day/30 Days Discharge Instructions: Apply over primary dressing as directed. Secured With: Child psychotherapist, Sterile 2x75 (in/in) 1 x Per Day/30 Days Discharge Instructions: Secure with stretch gauze as directed. 1. Would recommend that we going continue with wound care measures as before and the patient is in agreement with plan. We are avoiding any type of aggressive sharp debridement the patient also want to avoid any arteriogram at this point due to the risk to his kidneys which I completely understand. For that reason no further aggressive interventions will be made at this point. 2. I would recommend that we continue with the Betadine and dry gauze dressing to the wound currently I think that still the best way to go. We will see patient back for reevaluation in 3 weeks here  in the clinic. If anything worsens or changes patient will contact our office for additional recommendations. Electronic Signature(s) Signed: 10/02/2020 4:25:12 PM By: Worthy Keeler PA-C Entered By: Worthy Keeler on 10/02/2020 16:25:12 -------------------------------------------------------------------------------- SuperBill Details Patient Name: Date of Service: Patrick Shelton 10/02/2020 Medical Record Number: [id number] Patient Account Number: [id number] Date of Birth/Sex: Treating RN: 05-12-1940 (80 y.o. [name] Primary Care Provider: Altheimer, Juanda Bond Other Clinician: Referring Provider: Treating Provider/Extender: Worthy Keeler Altheimer, Docia Chuck in Treatment: 6 Diagnosis Coding ICD-10 Codes Code  Description [phone] Type 2 diabetes mellitus with foot ulcer L97.522 Non-pressure chronic ulcer of other part of left foot with fat layer exposed I89.0 Lymphedema, not elsewhere classified L97.822 Non-pressure chronic ulcer of other part of left lower leg with fat layer exposed N18.30 Chronic kidney disease, stage 3 unspecified Z79.01 Long term (current) use of anticoagulants Facility Procedures CPT4 Code: [id number] Description: 99213 - WOUND CARE VISIT-LEV 3 EST PT Modifier: Quantity: 1 Physician Procedures : CPT4 Code Description Modifier [id number] - WC PHYS LEVEL 4 - EST PT ICD-10 Diagnosis Description E11.621 Type 2 diabetes mellitus with foot ulcer L97.522 Non-pressure chronic ulcer of other part of left foot with fat layer exposed I89.0 Lymphedema,  not elsewhere classified L97.822 Non-pressure chronic ulcer of other part of left lower leg with fat layer exposed Quantity: 1 Electronic Signature(s) Signed: 10/02/2020 4:25:26 PM By: Worthy Keeler PA-C Entered By: Worthy Keeler on 10/02/2020 16:25:26

## 2020-10-04 DIAGNOSIS — Z794 Long term (current) use of insulin: Secondary | ICD-10-CM | POA: Diagnosis not present

## 2020-10-04 DIAGNOSIS — Z978 Presence of other specified devices: Secondary | ICD-10-CM | POA: Diagnosis not present

## 2020-10-04 DIAGNOSIS — E1140 Type 2 diabetes mellitus with diabetic neuropathy, unspecified: Secondary | ICD-10-CM | POA: Diagnosis not present

## 2020-10-04 DIAGNOSIS — E669 Obesity, unspecified: Secondary | ICD-10-CM | POA: Diagnosis not present

## 2020-10-04 DIAGNOSIS — Z6837 Body mass index (BMI) 37.0-37.9, adult: Secondary | ICD-10-CM | POA: Diagnosis not present

## 2020-10-04 DIAGNOSIS — E1122 Type 2 diabetes mellitus with diabetic chronic kidney disease: Secondary | ICD-10-CM | POA: Diagnosis not present

## 2020-10-04 DIAGNOSIS — Z9641 Presence of insulin pump (external) (internal): Secondary | ICD-10-CM | POA: Diagnosis not present

## 2020-10-04 DIAGNOSIS — N1831 Chronic kidney disease, stage 3a: Secondary | ICD-10-CM | POA: Diagnosis not present

## 2020-10-04 DIAGNOSIS — I129 Hypertensive chronic kidney disease with stage 1 through stage 4 chronic kidney disease, or unspecified chronic kidney disease: Secondary | ICD-10-CM | POA: Diagnosis not present

## 2020-10-04 DIAGNOSIS — E11649 Type 2 diabetes mellitus with hypoglycemia without coma: Secondary | ICD-10-CM | POA: Diagnosis not present

## 2020-10-04 DIAGNOSIS — E1165 Type 2 diabetes mellitus with hyperglycemia: Secondary | ICD-10-CM | POA: Diagnosis not present

## 2020-10-04 DIAGNOSIS — E1169 Type 2 diabetes mellitus with other specified complication: Secondary | ICD-10-CM | POA: Diagnosis not present

## 2020-10-07 DIAGNOSIS — Z20822 Contact with and (suspected) exposure to covid-19: Secondary | ICD-10-CM | POA: Diagnosis not present

## 2020-10-07 NOTE — Progress Notes (Signed)
Patrick Shelton, Patrick Shelton ([id number]) Visit Report for 10/02/2020 Arrival Information Details Patient Name: Date of Service: Patrick Shelton 10/02/2020 3:00 PM Medical Record Number: [id number] Patient Account Number: [id number] Date of Birth/Sex: Treating RN: May 01, 1940 (80 y.o. [name] Primary Care Eshal Propps: Altheimer, Juanda Bond Other Clinician: Referring Charda Janis: Treating Renarda Mullinix/Extender: Worthy Keeler Altheimer, Docia Chuck in Treatment: 6 Visit Information History Since Last Visit Added or deleted any medications: No Patient Arrived: Wheel Chair Any new allergies or adverse reactions: No Arrival Time: 15:32 Had a fall or experienced change in No Accompanied By: Wife activities of daily living that may affect Transfer Assistance: None risk of falls: Patient Identification Verified: Yes Signs or symptoms of abuse/neglect since last visito No Secondary Verification Process Completed: Yes Hospitalized since last visit: No Patient Requires Transmission-Based Precautions: No Implantable device outside of the clinic excluding No Patient Has Alerts: Yes cellular tissue based products placed in the center Patient Alerts: Patient on Blood Thinner since last visit: L ABI=Mila Doce L TBI=0.34 Has Dressing in Place as Prescribed: Yes Pain Present Now: No Electronic Signature(s) Signed: 10/07/2020 1:33:39 PM By: Sandre Kitty Entered By: Sandre Kitty on 10/02/2020 15:33:05 -------------------------------------------------------------------------------- Clinic Level of Care Assessment Details Patient Name: Date of Service: Patrick Shelton 10/02/2020 3:00 PM Medical Record Number: [id number] Patient Account Number: [id number] Date of Birth/Sex: Treating RN: 01-Jun-1940 (80 y.o. [name] Primary Care Laiyla Slagel: Altheimer, Juanda Bond Other Clinician: Referring Dominick Zertuche: Treating Zarea Diesing/Extender: Worthy Keeler Altheimer, Docia Chuck in Treatment: 6 Clinic  Level of Care Assessment Items TOOL 4 Quantity Score '[]'  - 0 Use when only an EandM is performed on FOLLOW-UP visit ASSESSMENTS - Nursing Assessment / Reassessment X- 1 10 Reassessment of Co-morbidities (includes updates in patient status) X- 1 5 Reassessment of Adherence to Treatment Plan ASSESSMENTS - Wound and Skin A ssessment / Reassessment '[]'  - 0 Simple Wound Assessment / Reassessment - one wound X- 2 5 Complex Wound Assessment / Reassessment - multiple wounds '[]'  - 0 Dermatologic / Skin Assessment (not related to wound area) ASSESSMENTS - Focused Assessment X- 1 5 Circumferential Edema Measurements - multi extremities '[]'  - 0 Nutritional Assessment / Counseling / Intervention X- 1 5 Lower Extremity Assessment (monofilament, tuning fork, pulses) '[]'  - 0 Peripheral Arterial Disease Assessment (using hand held doppler) ASSESSMENTS - Ostomy and/or Continence Assessment and Care '[]'  - 0 Incontinence Assessment and Management '[]'  - 0 Ostomy Care Assessment and Management (repouching, etc.) PROCESS - Coordination of Care X - Simple Patient / Family Education for ongoing care 1 15 '[]'  - 0 Complex (extensive) Patient / Family Education for ongoing care X- 1 10 Staff obtains Programmer, systems, Records, T Results / Process Orders est '[]'  - 0 Staff telephones HHA, Nursing Homes / Clarify orders / etc '[]'  - 0 Routine Transfer to another Facility (non-emergent condition) '[]'  - 0 Routine Hospital Admission (non-emergent condition) '[]'  - 0 New Admissions / Biomedical engineer / Ordering NPWT Apligraf, etc. , '[]'  - 0 Emergency Hospital Admission (emergent condition) X- 1 10 Simple Discharge Coordination '[]'  - 0 Complex (extensive) Discharge Coordination PROCESS - Special Needs '[]'  - 0 Pediatric / Minor Patient Management '[]'  - 0 Isolation Patient Management '[]'  - 0 Hearing / Language / Visual special needs '[]'  - 0 Assessment of Community assistance (transportation, D/C planning, etc.) '[]'   - 0 Additional assistance / Altered mentation '[]'  - 0 Support Surface(s) Assessment (bed, cushion, seat, etc.) INTERVENTIONS - Wound Cleansing / Measurement X - Simple Wound  Cleansing - one wound 1 5 '[]'  - 0 Complex Wound Cleansing - multiple wounds X- 1 5 Wound Imaging (photographs - any number of wounds) '[]'  - 0 Wound Tracing (instead of photographs) X- 1 5 Simple Wound Measurement - one wound '[]'  - 0 Complex Wound Measurement - multiple wounds INTERVENTIONS - Wound Dressings X - Small Wound Dressing one or multiple wounds 1 10 '[]'  - 0 Medium Wound Dressing one or multiple wounds '[]'  - 0 Large Wound Dressing one or multiple wounds X- 1 5 Application of Medications - topical '[]'  - 0 Application of Medications - injection INTERVENTIONS - Miscellaneous '[]'  - 0 External ear exam '[]'  - 0 Specimen Collection (cultures, biopsies, blood, body fluids, etc.) '[]'  - 0 Specimen(s) / Culture(s) sent or taken to Lab for analysis '[]'  - 0 Patient Transfer (multiple staff / Civil Service fast streamer / Similar devices) '[]'  - 0 Simple Staple / Suture removal (25 or less) '[]'  - 0 Complex Staple / Suture removal (26 or more) '[]'  - 0 Hypo / Hyperglycemic Management (close monitor of Blood Glucose) '[]'  - 0 Ankle / Brachial Index (ABI) - do not check if billed separately X- 1 5 Vital Signs Has the patient been seen at the hospital within the last three years: Yes Total Score: 105 Level Of Care: New/Established - Level 3 Electronic Signature(s) Signed: 10/02/2020 5:32:13 PM By: Baruch Gouty RN, BSN Entered By: Baruch Gouty on 10/02/2020 16:10:56 -------------------------------------------------------------------------------- Encounter Discharge Information Details Patient Name: Date of Service: Patrick Shelton 10/02/2020 3:00 PM Medical Record Number: [id number] Patient Account Number: [id number] Date of Birth/Sex: Treating RN: 1940-11-07 (80 y.o. [name] Primary Care Damia Bobrowski: Altheimer, Juanda Bond Other Clinician: Referring Roisin Mones: Treating Saige Canton/Extender: Worthy Keeler Altheimer, Docia Chuck in Treatment: 6 Encounter Discharge Information Items Discharge Condition: Stable Ambulatory Status: Wheelchair Discharge Destination: Home Transportation: Private Auto Accompanied By: spouse Schedule Follow-up Appointment: Yes Clinical Summary of Care: Patient Declined Electronic Signature(s) Signed: 10/02/2020 5:32:13 PM By: Baruch Gouty RN, BSN Entered By: Baruch Gouty on 10/02/2020 16:38:04 -------------------------------------------------------------------------------- Lower Extremity Assessment Details Patient Name: Date of Service: Patrick Shelton 10/02/2020 3:00 PM Medical Record Number: [id number] Patient Account Number: [id number] Date of Birth/Sex: Treating RN: April 24, 1940 (80 y.o. [name] Primary Care Janda Cargo: Altheimer, Juanda Bond Other Clinician: Referring Tikisha Molinaro: Treating Jahvier Aldea/Extender: Worthy Keeler Altheimer, Docia Chuck in Treatment: 6 Edema Assessment Assessed: Shirlyn Goltz: Yes] [Right: No] Edema: [Left: Ye] [Right: s] Calf Left: Right: Point of Measurement: 38 cm From Medial Instep 42 cm Ankle Left: Right: Point of Measurement: 0 cm From Medial Instep 28 cm Vascular Assessment Pulses: Dorsalis Pedis Palpable: [Left:Yes] Electronic Signature(s) Signed: 10/02/2020 5:41:26 PM By: Deon Pilling Entered By: Deon Pilling on 10/02/2020 15:52:42 -------------------------------------------------------------------------------- Multi-Disciplinary Care Plan Details Patient Name: Date of Service: Patrick Shelton 10/02/2020 3:00 PM Medical Record Number: [id number] Patient Account Number: [id number] Date of Birth/Sex: Treating RN: [birth year] (79 y.o. [name] Primary Care Idalie Canto: Altheimer, Juanda Bond Other Clinician: Referring Colleene Swarthout: Treating Sidnie Swalley/Extender: Worthy Keeler Altheimer, Docia Chuck in  Treatment: Medley reviewed with physician Active Inactive Nutrition Nursing Diagnoses: Impaired glucose control: actual or potential Potential for alteratiion in Nutrition/Potential for imbalanced nutrition Goals: Patient/caregiver will maintain therapeutic glucose control Date Initiated: 08/21/2020 Target Resolution Date: 10/23/2020 Goal Status: Active Interventions: Assess HgA1c results as ordered upon admission and as needed Provide education on elevated blood sugars and impact on wound healing Treatment Activities: Patient referred  to Primary Care Physician for further nutritional evaluation : 08/21/2020 Notes: 09/18/20: Glucose control ongoing Tissue Oxygenation Nursing Diagnoses: Actual ineffective tissue perfusion; peripheral (select once diagnosis is confirmed) Knowledge deficit related to disease process and management Goals: Patient/caregiver will verbalize understanding of disease process and disease management Date Initiated: 08/21/2020 Target Resolution Date: 10/23/2020 Goal Status: Active Interventions: Assess patient understanding of disease process and management upon diagnosis and as needed Assess peripheral arterial status upon admission and as needed Provide education on tissue oxygenation and ischemia Treatment Activities: Revascularization procedures : 08/21/2020 T ordered outside of clinic : 08/21/2020 est Notes: Wound/Skin Impairment Nursing Diagnoses: Impaired tissue integrity Knowledge deficit related to ulceration/compromised skin integrity Goals: Patient/caregiver will verbalize understanding of skin care regimen Date Initiated: 08/21/2020 Date Inactivated: 09/18/2020 Target Resolution Date: 09/18/2020 Goal Status: Met Ulcer/skin breakdown will have a volume reduction of 30% by week 4 Date Initiated: 08/21/2020 Date Inactivated: 10/02/2020 Target Resolution Date: 10/02/2020 Unmet Reason: PAD, refusing agram, Goal Status:  Unmet continue wound care Ulcer/skin breakdown will have a volume reduction of 50% by week 8 Date Initiated: 10/02/2020 Target Resolution Date: 10/30/2020 Goal Status: Active Interventions: Assess patient/caregiver ability to obtain necessary supplies Assess patient/caregiver ability to perform ulcer/skin care regimen upon admission and as needed Assess ulceration(s) every visit Provide education on ulcer and skin care Treatment Activities: Skin care regimen initiated : 08/21/2020 Topical wound management initiated : 08/21/2020 Notes: 09/18/20: New wound today, target date extended. Electronic Signature(s) Signed: 10/02/2020 5:32:13 PM By: Baruch Gouty RN, BSN Entered By: Baruch Gouty on 10/02/2020 16:09:09 -------------------------------------------------------------------------------- Pain Assessment Details Patient Name: Date of Service: Patrick Shelton 10/02/2020 3:00 PM Medical Record Number: [id number] Patient Account Number: [id number] Date of Birth/Sex: Treating RN: [birth year] (80 y.o. [name] Primary Care Yeshaya Vath: Altheimer, Juanda Bond Other Clinician: Referring Rachard Isidro: Treating Almarie Kurdziel/Extender: Worthy Keeler Altheimer, Docia Chuck in Treatment: 6 Active Problems Location of Pain Severity and Description of Pain Patient Has Paino No Site Locations Pain Management and Medication Current Pain Management: Electronic Signature(s) Signed: 10/02/2020 5:32:13 PM By: Baruch Gouty RN, BSN Signed: 10/07/2020 1:33:39 PM By: Sandre Kitty Entered By: Sandre Kitty on 10/02/2020 15:33:14 -------------------------------------------------------------------------------- Patient/Caregiver Education Details Patient Name: Date of Service: Patrick Shelton 7/27/2022andnbsp3:00 PM Medical Record Number: [id number] Patient Account Number: [id number] Date of Birth/Gender: Treating RN: 08-Apr-1940 (80 y.o. [name] Primary Care Physician:  Altheimer, Juanda Bond Other Clinician: Referring Physician: Treating Physician/Extender: Worthy Keeler Altheimer, Docia Chuck in Treatment: 6 Education Assessment Education Provided To: Patient Education Topics Provided Tissue Oxygenation: Methods: Explain/Verbal Responses: Reinforcements needed, State content correctly Wound/Skin Impairment: Methods: Explain/Verbal Responses: Reinforcements needed, State content correctly Electronic Signature(s) Signed: 10/02/2020 5:32:13 PM By: Baruch Gouty RN, BSN Entered By: Baruch Gouty on 10/02/2020 16:09:53 -------------------------------------------------------------------------------- Wound Assessment Details Patient Name: Date of Service: Patrick Shelton 10/02/2020 3:00 PM Medical Record Number: [id number] Patient Account Number: [id number] Date of Birth/Sex: Treating RN: 04-08-1940 (80 y.o. [name] Primary Care Kennith Morss: Altheimer, Juanda Bond Other Clinician: Referring Meghin Thivierge: Treating Kemauri Musa/Extender: Worthy Keeler Altheimer, Docia Chuck in Treatment: 6 Wound Status Wound Number: 1 Primary Diabetic Wound/Ulcer of the Lower Extremity Etiology: Wound Location: Left T Fourth oe Wound Open Wounding Event: Trauma Status: Date Acquired: 05/13/2020 Comorbid Cataracts, Deep Vein Thrombosis, Hypertension, Peripheral Weeks Of Treatment: 6 History: Venous Disease, Type II Diabetes, Gout, Osteoarthritis, Clustered Wound: No Neuropathy, Received Radiation Pending Amputation On Presentation Photos Wound Measurements Length: (cm) 1.4 Width: (  cm) 1.8 Depth: (cm) 0.1 Area: (cm) 1.979 Volume: (cm) 0.198 % Reduction in Area: -38.5% % Reduction in Volume: -38.5% Epithelialization: None Tunneling: No Undermining: No Wound Description Classification: Grade 1 Wound Margin: Distinct, outline attached Exudate Amount: Medium Exudate Type: Serosanguineous Exudate Color: red, brown Foul Odor After  Cleansing: No Slough/Fibrino No Wound Bed Granulation Amount: None Present (0%) Exposed Structure Necrotic Amount: Large (67-100%) Fascia Exposed: No Necrotic Quality: Eschar Fat Layer (Subcutaneous Tissue) Exposed: Yes Tendon Exposed: No Muscle Exposed: No Joint Exposed: No Bone Exposed: No Treatment Notes Wound #1 (Toe Fourth) Wound Laterality: Left Cleanser Soap and Water Discharge Instruction: May shower and wash wound with dial antibacterial soap and water prior to dressing change. Wound Cleanser Discharge Instruction: Cleanse the wound with wound cleanser prior to applying a clean dressing using gauze sponges, not tissue or cotton balls. Peri-Wound Care Topical Povidone Iodine Swabstick 3 pack, 4(in) Discharge Instruction: paint with betadine Primary Dressing Secondary Dressing Woven Gauze Sponges 2x2 in Discharge Instruction: Apply over primary dressing as directed. Secured With Conforming Stretch Gauze Bandage, Sterile 2x75 (in/in) Discharge Instruction: Secure with stretch gauze as directed. Compression Wrap Compression Stockings Add-Ons Electronic Signature(s) Signed: 10/02/2020 5:32:13 PM By: Baruch Gouty RN, BSN Signed: 10/02/2020 5:41:26 PM By: Deon Pilling Entered By: Deon Pilling on 10/02/2020 15:53:39 -------------------------------------------------------------------------------- Wound Assessment Details Patient Name: Date of Service: Patrick Shelton 10/02/2020 3:00 PM Medical Record Number: [id number] Patient Account Number: [id number] Date of Birth/Sex: Treating RN: 1940-05-18 (80 y.o. [name] Primary Care Lynnea Vandervoort: Altheimer, Juanda Bond Other Clinician: Referring Kyuss Hale: Treating Tayte Mcwherter/Extender: Worthy Keeler Altheimer, Docia Chuck in Treatment: 6 Wound Status Wound Number: 2 Primary Venous Leg Ulcer Etiology: Wound Location: Left, Lateral Lower Leg Wound Open Wounding Event: Blister Status: Date Acquired:  09/13/2020 Comorbid Cataracts, Deep Vein Thrombosis, Hypertension, Peripheral Weeks Of Treatment: 2 History: Venous Disease, Type II Diabetes, Gout, Osteoarthritis, Clustered Wound: No Neuropathy, Received Radiation Photos Wound Measurements Length: (cm) Width: (cm) Depth: (cm) Area: (cm) Volume: (cm) 0 % Reduction in Area: 100% 0 % Reduction in Volume: 100% 0 Epithelialization: None 0 0 Wound Description Classification: Full Thickness Without Exposed Support Structures Wound Margin: Distinct, outline attached Exudate Amount: Medium Exudate Type: Serous Exudate Color: amber Foul Odor After Cleansing: No Slough/Fibrino No Wound Bed Granulation Amount: Large (67-100%) Exposed Structure Granulation Quality: Red Fascia Exposed: No Necrotic Amount: None Present (0%) Fat Layer (Subcutaneous Tissue) Exposed: Yes Tendon Exposed: No Muscle Exposed: No Joint Exposed: No Bone Exposed: No Electronic Signature(s) Signed: 10/02/2020 5:32:13 PM By: Baruch Gouty RN, BSN Signed: 10/07/2020 1:33:39 PM By: Sandre Kitty Entered By: Sandre Kitty on 10/02/2020 15:39:25 -------------------------------------------------------------------------------- Vitals Details Patient Name: Date of Service: Patrick Shelton 10/02/2020 3:00 PM Medical Record Number: [id number] Patient Account Number: [id number] Date of Birth/Sex: Treating RN: December 20, 1940 (80 y.o. [name] Primary Care Jackelin Correia: Altheimer, Juanda Bond Other Clinician: Referring Yajahira Tison: Treating Selwyn Reason/Extender: Worthy Keeler Altheimer, Docia Chuck in Treatment: 6 Vital Signs Time Taken: 15:33 Temperature (F): 97.9 Pulse (bpm): 74 Respiratory Rate (breaths/min): 20 Blood Pressure (mmHg): 165/77 Capillary Blood Glucose (mg/dl): 70 Reference Range: 80 - 120 mg / dl Electronic Signature(s) Signed: 10/07/2020 1:33:39 PM By: Sandre Kitty Entered By: Sandre Kitty on 10/02/2020 15:35:08

## 2020-10-23 ENCOUNTER — Other Ambulatory Visit: Payer: Self-pay

## 2020-10-23 ENCOUNTER — Ambulatory Visit
Admission: RE | Admit: 2020-10-23 | Discharge: 2020-10-23 | Disposition: A | Discharge status: Home / Self Care | Payer: Medicare Other | Source: Ambulatory Visit | Attending: Physician Assistant | Admitting: Physician Assistant

## 2020-10-23 ENCOUNTER — Other Ambulatory Visit: Payer: Self-pay | Admitting: Physician Assistant

## 2020-10-23 ENCOUNTER — Encounter (HOSPITAL_BASED_OUTPATIENT_CLINIC_OR_DEPARTMENT_OTHER): Payer: Medicare Other | Attending: Physician Assistant | Admitting: Physician Assistant

## 2020-10-23 DIAGNOSIS — Z7901 Long term (current) use of anticoagulants: Secondary | ICD-10-CM | POA: Diagnosis not present

## 2020-10-23 DIAGNOSIS — L97822 Non-pressure chronic ulcer of other part of left lower leg with fat layer exposed: Secondary | ICD-10-CM | POA: Diagnosis not present

## 2020-10-23 DIAGNOSIS — I890 Lymphedema, not elsewhere classified: Secondary | ICD-10-CM | POA: Insufficient documentation

## 2020-10-23 DIAGNOSIS — L97509 Non-pressure chronic ulcer of other part of unspecified foot with unspecified severity: Secondary | ICD-10-CM

## 2020-10-23 DIAGNOSIS — E11622 Type 2 diabetes mellitus with other skin ulcer: Secondary | ICD-10-CM | POA: Diagnosis not present

## 2020-10-23 DIAGNOSIS — Z87891 Personal history of nicotine dependence: Secondary | ICD-10-CM | POA: Diagnosis not present

## 2020-10-23 DIAGNOSIS — E11621 Type 2 diabetes mellitus with foot ulcer: Secondary | ICD-10-CM | POA: Diagnosis not present

## 2020-10-23 DIAGNOSIS — L97522 Non-pressure chronic ulcer of other part of left foot with fat layer exposed: Secondary | ICD-10-CM | POA: Insufficient documentation

## 2020-10-23 DIAGNOSIS — E1122 Type 2 diabetes mellitus with diabetic chronic kidney disease: Secondary | ICD-10-CM | POA: Diagnosis not present

## 2020-10-23 DIAGNOSIS — N183 Chronic kidney disease, stage 3 unspecified: Secondary | ICD-10-CM | POA: Insufficient documentation

## 2020-10-23 DIAGNOSIS — L97529 Non-pressure chronic ulcer of other part of left foot with unspecified severity: Secondary | ICD-10-CM | POA: Diagnosis not present

## 2020-10-23 NOTE — Progress Notes (Addendum)
DECLYN, BONAR ([id number]) Visit Report for 10/23/2020 Chief Complaint Document Details Patient Name: Date of Service: Patrick Shelton 10/23/2020 3:15 PM Medical Record Number: [id number] Patient Account Number: [id number] Date of Birth/Sex: Treating RN: 1940/12/10 (80 y.o. Shelton) Primary Care Provider: Altheimer, Juanda Bond Other Clinician: Referring Provider: Treating Provider/Extender: Worthy Keeler Altheimer, Docia Chuck in Treatment: 9 Information Obtained from: Patient Chief Complaint Left 4th toe ulcer and left LE Ulcer Electronic Signature(s) Signed: 10/23/2020 3:31:53 PM By: Worthy Keeler PA-C Entered By: Worthy Keeler on 10/23/2020 15:31:52 -------------------------------------------------------------------------------- Debridement Details Patient Name: Date of Service: Patrick Shelton 10/23/2020 3:15 PM Medical Record Number: [id number] Patient Account Number: [id number] Date of Birth/Sex: Treating RN: [birth year] (80 y.o. Lorette Ang, Meta.Reding Primary Care Provider: Altheimer, Juanda Bond Other Clinician: Referring Provider: Treating Provider/Extender: Worthy Keeler Altheimer, Docia Chuck in Treatment: 9 Debridement Performed for Assessment: Wound #1 Left T Fourth oe Performed By: Physician Worthy Keeler, PA Debridement Type: Debridement Severity of Tissue Pre Debridement: Fat layer exposed Level of Consciousness (Pre-procedure): Awake and Alert Pre-procedure Verification/Time Out Yes - 15:45 Taken: Start Time: 15:46 Pain Control: Other : benzocaine 20% T Area Debrided (L x W): otal 1.5 (cm) x 2 (cm) = 3 (cm) Tissue and other material debrided: Non-Viable, Eschar Level: Non-Viable Tissue Debridement Description: Selective/Open Wound Instrument: Forceps, Scissors Bleeding: None End Time: 15:52 Procedural Pain: 0 Post Procedural Pain: 0 Response to Treatment: Procedure was tolerated well Level of Consciousness (Post- Awake and  Alert procedure): Post Debridement Measurements of Total Wound Length: (cm) 1.5 Width: (cm) 2 Depth: (cm) 0.2 Volume: (cm) 0.471 Character of Wound/Ulcer Post Debridement: Improved Severity of Tissue Post Debridement: Fat layer exposed Post Procedure Diagnosis Same as Pre-procedure Electronic Signature(s) Signed: 10/23/2020 4:56:53 PM By: Worthy Keeler PA-C Signed: 10/23/2020 5:25:21 PM By: Deon Pilling Entered By: Deon Pilling on 10/23/2020 15:52:56 -------------------------------------------------------------------------------- HPI Details Patient Name: Date of Service: Patrick Shelton 10/23/2020 3:15 PM Medical Record Number: [id number] Patient Account Number: [id number] Date of Birth/Sex: Treating RN: 11-22-1940 (80 y.o. Shelton) Primary Care Provider: Altheimer, Juanda Bond Other Clinician: Referring Provider: Treating Provider/Extender: Worthy Keeler Altheimer, Docia Chuck in Treatment: 9 History of Present Illness HPI Description: 08/21/2020 upon evaluation today patient presents for initial inspection here in the clinic concerning issues that he has been having actually with the wound on his left leg. However upon evaluation by the time he got into see [place] this wound has healed and seems to be doing quite well. With that being said his main issue currently is actually a wound that is on his toe. This is in fact the left fourth toe. 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 Nonetheless he has been seen by podiatry. He has been seen specifically by an stride foot and ankle specialist. It appears that the provider is Dr. Rosemary Holms nonetheless currently she has trimmed his nails but has not done  anything as far as debridement in regard to the toe and again I really think that is probably not in his best interest based on the arterial flow. He does see vein and vascular specialist  as well and apparently he is not interested whatsoever in proceeding with an arteriogram simply due to the fact that he does have stage III kidney disease and is more concerned about conservative's kidneys instead of potentially risking losing kidney function and possibly still losing the toe or the leg in any way. Either way he tells me that he would "proceed with an amputation in regard to the toe or even leg before I will have a direct procedure to work on the blood flow". He states his kidneys are more important. He is on anticoagulant therapy long-term. This is Eliquis. He is seen with his wife during the office visit today. He does have a history of diabetes mellitus type 2 with a hemoglobin A1c of 8.1 most recent go I do not know the exact time all these records are in [place] which I do not have direct access to. He was a previous smoker but has not smoked for 40 years. 09/04/2020 upon evaluation today patient's toe actually appears to be doing decently well. In fact it is pretty much about the same as what it was previously is most long as of active infection at this time. No fevers, chills, nausea, vomiting, or diarrhea. Patient appears to be showing signs of active infection which is great news. This is staying fairly stable and dry which is exactly what we want. He does have a follow-up appointment with vascular at the end of July. 09/18/2020 upon evaluation today patient actually appears to be doing quite well in regard to his wounds. He has been tolerating the dressing changes without complication. The toe actually is looking better and I think the Betadine is helping to dry this up quite nicely. With that being said he still is having a significant amount of issues with his leg on the left. Specifically he  has a blister currently he sees vascular next week for now I am really not going to be wrapping him until we see where things stand from a vascular standpoint and ensure that he is still doing okay. 10/02/2008 patient presents today for follow-up concerning his leg and toe ulcer. The leg is actually healed which is great news. The toe is still dry and stable some of the eschar starting to peel off a little bit more but in general overall things seem to be doing quite well. He did see Dr. Doren Custard on 09/25/2020. Dr. Doren Custard offered an arteriogram but the patient wanted. His kidneys in the trouble there so he does not want to proceed with any type of intervention as far as an arteriogram is concerned. With that being said for that reason we will go ahead and continue to manage this just on a outpatient basis and the patient is much more pleased with this. 10/23/2020 upon evaluation today patient unfortunately is having fluid draining from around the eschar on the tip of his toe as well as an issue currently with some of the eschar lifting up. Obviously this is no longer stable and do not think the Betadine is continuing good at this point. I discussed all this with him and his wife today. With that being said I think that we need to try to carefully clear away the eschar is much as possible and then subsequently probably use Santyl to help treat this. The patient is in agreement with that plan. I did discuss the risk and benefits particularly in regard to the fact that he does not have good blood flow I explained again that  I am not can I do an aggressive debridement here but that I do think he needs some debridement in order to help with what we are seeing currently. The patient voiced understanding. In the end he did decide that he wants to go forward with the debridement and I went to be careful but nonetheless we do need to try think to get this moving in a better direction if he has any chance of saving the  toe. He is already made the decision that if the toe cannot be saved he will proceed with the amputation this will be a below-knee amputation as his blood flow is not good into the leg. He declined the angiogram with vascular due to the fact that dye would have to be used and that would likely further damage his kidneys he states that his kidneys are more important than the leg. He already has a prosthesis on the right leg. Electronic Signature(s) Signed: 10/23/2020 4:16:35 PM By: Worthy Keeler PA-C Entered By: Worthy Keeler on 10/23/2020 16:16:35 -------------------------------------------------------------------------------- Physical Exam Details Patient Name: Date of Service: Patrick Shelton 10/23/2020 3:15 PM Medical Record Number: [id number] Patient Account Number: [id number] Date of Birth/Sex: Treating RN: 1940/06/17 (80 y.o. Shelton) Primary Care Provider: Altheimer, Juanda Bond Other Clinician: Referring Provider: Treating Provider/Extender: Worthy Keeler Altheimer, Docia Chuck in Treatment: 34 Constitutional Well-nourished and well-hydrated in no acute distress. Respiratory normal breathing without difficulty. Psychiatric this patient is able to make decisions and demonstrates good insight into disease process. Alert and Oriented x 3. pleasant and cooperative. Notes Upon inspection patient's wound bed actually showed signs again of eschar that is loosening up and has some fluid draining from around the edges. I did use scissors and forceps to lift up and trim off the eschar as best I could I got the majority of this as a least 90% removed. There was only minimal bleeding and he had only minimal discomfort in 1 spot. Nonetheless this did look much better still the toe does not appear to be the healthiest there is some definite diminished capillary refill and again I am not certain if this is going to be curative or just a delay of the inevitable which is potentially the  amputation. Electronic Signature(s) Signed: 10/23/2020 4:17:12 PM By: Worthy Keeler PA-C Entered By: Worthy Keeler on 10/23/2020 16:17:12 -------------------------------------------------------------------------------- Physician Orders Details Patient Name: Date of Service: Patrick Shelton 10/23/2020 3:15 PM Medical Record Number: [id number] Patient Account Number: [id number] Date of Birth/Sex: Treating RN: [birth year] (80 y.o. Lorette Ang, Meta.Reding Primary Care Provider: Altheimer, Juanda Bond Other Clinician: Referring Provider: Treating Provider/Extender: Worthy Keeler Altheimer, Docia Chuck in Treatment: 9 Verbal / Phone Orders: No Diagnosis Coding ICD-10 Coding Code Description E11.621 Type 2 diabetes mellitus with foot ulcer L97.522 Non-pressure chronic ulcer of other part of left foot with fat layer exposed I89.0 Lymphedema, not elsewhere classified L97.822 Non-pressure chronic ulcer of other part of left lower leg with fat layer exposed N18.30 Chronic kidney disease, stage 3 unspecified Z79.01 Long term (current) use of anticoagulants Follow-up Appointments ppointment in 1 week. Margarita Grizzle Return A Other: - Halo=Supplies Bathing/ Shower/ Hygiene May shower and wash wound with soap and water. Edema Control - Lymphedema / SCD / Other Left Lower Extremity Elevate legs to the level of the heart or above for 30 minutes daily and/or when sitting, a frequency of: Moisturize legs daily. Compression stocking or Garment 10-20 mm/Hg pressure to: -  to left leg daily, open toe knee high Additional Orders / Instructions Follow Nutritious Diet Wound Treatment Wound #1 - T Fourth oe Wound Laterality: Left Cleanser: Soap and Water 1 x Per Day/30 Days Discharge Instructions: May shower and wash wound with dial antibacterial soap and water prior to dressing change. Cleanser: Wound Cleanser 1 x Per Day/30 Days Discharge Instructions: Cleanse the wound with wound cleanser prior to  applying a clean dressing using gauze sponges, not tissue or cotton balls. Prim Dressing: Santyl Ointment 1 x Per Day/30 Days ary Discharge Instructions: Apply nickel thick amount to wound bed as instructed Secondary Dressing: Woven Gauze Sponges 2x2 in 1 x Per Day/30 Days Discharge Instructions: Apply over primary dressing as directed. Secured With: Child psychotherapist, Sterile 2x75 (in/in) 1 x Per Day/30 Days Discharge Instructions: Secure with stretch gauze as directed. Radiology X-ray, toes left - x-ray of left foot 4th toe related to non healing diabetic foot ulcer. CPT code - (ICD10 E11.621 - Type 2 diabetes mellitus with foot ulcer) Patient Medications llergies: prednisone, dulaglutide A Notifications Medication Indication Start End 10/23/2020 Santyl DOSE topical 250 unit/gram ointment - ointment topical Apply nickel thick daily to the wound bed and then cover with a dressing as directed in clinic x 30 days Electronic Signature(s) Signed: 10/23/2020 4:19:02 PM By: Worthy Keeler PA-C Entered By: Worthy Keeler on 10/23/2020 16:19:02 Prescription 10/23/2020 -------------------------------------------------------------------------------- Lanette Hampshire PA Patient Name: Provider: 1940-04-08 [id number] Date of Birth: NPI#Jerilynn Mages [id number] Sex: DEA #: [id number] Phone #: License #: [place] Patient Address: [address] [place], [place] [address] [place], [place] [address] [phone] Allergies prednisone; dulaglutide Provider's Orders X-ray, toes left - ICD10: E11.621 - x-ray of left foot 4th toe related to non healing diabetic foot ulcer. CPT code Hand Signature: Date(s): Electronic Signature(s) Signed: 10/23/2020 4:56:53 PM By: Worthy Keeler PA-C Entered By: Worthy Keeler on 10/23/2020  16:19:02 -------------------------------------------------------------------------------- Problem List Details Patient Name: Date of Service: Patrick Shelton 10/23/2020 3:15 PM Medical Record Number: [id number] Patient Account Number: [id number] Date of Birth/Sex: Treating RN: 08-02-1940 (80 y.o. Shelton) Primary Care Provider: Altheimer, Juanda Bond Other Clinician: Referring Provider: Treating Provider/Extender: Worthy Keeler Altheimer, Docia Chuck in Treatment: 9 Active Problems ICD-10 Encounter Code Description Active Date MDM Diagnosis E11.621 Type 2 diabetes mellitus with foot ulcer 08/21/2020 No Yes L97.522 Non-pressure chronic ulcer of other part of left foot with fat layer exposed 08/21/2020 No Yes I89.0 Lymphedema, not elsewhere classified 09/18/2020 No Yes L97.822 Non-pressure chronic ulcer of other part of left lower leg with fat layer exposed7/13/2022 No Yes N18.30 Chronic kidney disease, stage 3 unspecified 08/21/2020 No Yes Z79.01 Long term (current) use of anticoagulants 08/21/2020 No Yes Inactive Problems Resolved Problems Electronic Signature(s) Signed: 10/23/2020 3:31:37 PM By: Worthy Keeler PA-C Entered By: Worthy Keeler on 10/23/2020 15:31:36 -------------------------------------------------------------------------------- Progress Note Details Patient Name: Date of Service: Patrick Shelton 10/23/2020 3:15 PM Medical Record Number: [id number] Patient Account Number: [id number] Date of Birth/Sex: Treating RN: 09-Jul-1940 (80 y.o. Shelton) Primary Care Provider: Altheimer, Juanda Bond Other Clinician: Referring Provider: Treating Provider/Extender: Worthy Keeler Altheimer, Docia Chuck in Treatment: 9 Subjective Chief Complaint Information obtained from Patient Left 4th toe ulcer and left LE Ulcer History of Present Illness (HPI) 08/21/2020 upon evaluation today patient presents for initial inspection here in the clinic concerning issues that he has been  having actually with the wound on his left leg. However upon evaluation by the time he got into see [place] this wound has healed and seems to be doing quite well. With that being said his main issue currently is actually a wound that is on his toe. This is in fact the left fourth toe. 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 Nonetheless he has been seen by podiatry. He has been seen specifically by an stride foot and ankle specialist. It appears that the provider is Dr. Rosemary Holms nonetheless currently she has trimmed his nails but has not done anything as far as debridement in regard to the toe and again I really think that is probably not in his best interest based on the arterial flow. He does see vein and vascular specialist as well and apparently he is not interested whatsoever in proceeding with an arteriogram simply due to the fact that he does have stage III kidney disease and is more concerned about conservative's kidneys instead of potentially risking losing kidney function and possibly still losing the toe or the leg in any way. Either way he tells me that he would "proceed with an amputation in regard to the toe or even leg before I will have a direct procedure to work on the blood flow". He states his kidneys are more important. He is on anticoagulant therapy long-term. This is Eliquis. He is seen with his wife during the office visit today. He does have a history of diabetes mellitus type 2 with a hemoglobin A1c of 8.1 most recent go I do not know the exact time all these records are in [place] which I do not have direct access to. He was a previous smoker but has not smoked for 40 years. 09/04/2020 upon evaluation  today patient's toe actually appears to be doing decently well. In fact it is pretty much about the same as what it was previously is most long as of active infection at this time. No fevers, chills, nausea, vomiting, or diarrhea. Patient appears to be showing signs of active infection which is great news. This is staying fairly stable and dry which is exactly what we want. He does have a follow-up appointment with vascular at the end of July. 09/18/2020 upon evaluation today patient actually appears to be doing quite well in regard to his wounds. He has been tolerating the dressing changes without complication. The toe actually is looking better and I think the Betadine is helping to dry this up quite nicely. With that being said he still is having a significant amount of issues with his leg on the left. Specifically he has a blister currently he sees vascular next week for now I am really not going to be wrapping him until we see where things stand from a vascular standpoint and ensure that he is still doing okay. 10/02/2008 patient presents today for follow-up concerning his leg and toe ulcer. The leg is actually healed which is great news. The toe is still dry and stable some of the eschar starting to peel off a little bit more but in general overall things seem to be doing quite well. He did see Dr.  Dixon on 09/25/2020. Dr. Doren Custard offered an arteriogram but the patient wanted. His kidneys in the trouble there so he does not want to proceed with any type of intervention as far as an arteriogram is concerned. With that being said for that reason we will go ahead and continue to manage this just on a outpatient basis and the patient is much more pleased with this. 10/23/2020 upon evaluation today patient unfortunately is having fluid draining from around the eschar on the tip of his toe as well as an issue currently with some of the eschar lifting up. Obviously this is no longer stable and do not think the  Betadine is continuing good at this point. I discussed all this with him and his wife today. With that being said I think that we need to try to carefully clear away the eschar is much as possible and then subsequently probably use Santyl to help treat this. The patient is in agreement with that plan. I did discuss the risk and benefits particularly in regard to the fact that he does not have good blood flow I explained again that I am not can I do an aggressive debridement here but that I do think he needs some debridement in order to help with what we are seeing currently. The patient voiced understanding. In the end he did decide that he wants to go forward with the debridement and I went to be careful but nonetheless we do need to try think to get this moving in a better direction if he has any chance of saving the toe. He is already made the decision that if the toe cannot be saved he will proceed with the amputation this will be a below-knee amputation as his blood flow is not good into the leg. He declined the angiogram with vascular due to the fact that dye would have to be used and that would likely further damage his kidneys he states that his kidneys are more important than the leg. He already has a prosthesis on the right leg. Objective Constitutional Well-nourished and well-hydrated in no acute distress. Vitals Time Taken: 3:27 PM, Temperature: 97.9 F, Pulse: 101 bpm, Respiratory Rate: 20 breaths/min, Blood Pressure: 116/71 mmHg, Capillary Blood Glucose: 138 mg/dl. Respiratory normal breathing without difficulty. Psychiatric this patient is able to make decisions and demonstrates good insight into disease process. Alert and Oriented x 3. pleasant and cooperative. General Notes: Upon inspection patient's wound bed actually showed signs again of eschar that is loosening up and has some fluid draining from around the edges. I did use scissors and forceps to lift up and trim off the  eschar as best I could I got the majority of this as a least 90% removed. There was only minimal bleeding and he had only minimal discomfort in 1 spot. Nonetheless this did look much better still the toe does not appear to be the healthiest there is some definite diminished capillary refill and again I am not certain if this is going to be curative or just a delay of the inevitable which is potentially the amputation. Integumentary (Hair, Skin) Wound #1 status is Open. Original cause of wound was Trauma. The date acquired was: 05/13/2020. The wound has been in treatment 9 weeks. The wound is located on the Left T Fourth. The wound measures 1.5cm length x 2cm width x 0.1cm depth; 2.356cm^2 area and 0.236cm^3 volume. There is Fat Layer oe (Subcutaneous Tissue) exposed. There is no tunneling or undermining noted. There is  a medium amount of serosanguineous drainage noted. The wound margin is distinct with the outline attached to the wound base. There is no granulation within the wound bed. There is a large (67-100%) amount of necrotic tissue within the wound bed including Eschar and Adherent Slough. Assessment Active Problems ICD-10 Type 2 diabetes mellitus with foot ulcer Non-pressure chronic ulcer of other part of left foot with fat layer exposed Lymphedema, not elsewhere classified Non-pressure chronic ulcer of other part of left lower leg with fat layer exposed Chronic kidney disease, stage 3 unspecified Long term (current) use of anticoagulants Procedures Wound #1 Pre-procedure diagnosis of Wound #1 is a Diabetic Wound/Ulcer of the Lower Extremity located on the Left T Fourth .Severity of Tissue Pre Debridement is: oe Fat layer exposed. There was a Selective/Open Wound Non-Viable Tissue Debridement with a total area of 3 sq cm performed by Worthy Keeler, PA. With the following instrument(s): Forceps, and Scissors to remove Non-Viable tissue/material. Material removed includes Eschar after  achieving pain control using Other (benzocaine 20%). A time out was conducted at 15:45, prior to the start of the procedure. There was no bleeding. The procedure was tolerated well with a pain level of 0 throughout and a pain level of 0 following the procedure. Post Debridement Measurements: 1.5cm length x 2cm width x 0.2cm depth; 0.471cm^3 volume. Character of Wound/Ulcer Post Debridement is improved. Severity of Tissue Post Debridement is: Fat layer exposed. Post procedure Diagnosis Wound #1: Same as Pre-Procedure Plan Follow-up Appointments: Return Appointment in 1 week. Margarita Grizzle Other: - [facility]=Supplies Bathing/ Shower/ Hygiene: May shower and wash wound with soap and water. Edema Control - Lymphedema / SCD / Other: Elevate legs to the level of the heart or above for 30 minutes daily and/or when sitting, a frequency of: Moisturize legs daily. Compression stocking or Garment 10-20 mm/Hg pressure to: - to left leg daily, open toe knee high Additional Orders / Instructions: Follow Nutritious Diet Radiology ordered were: X-ray, toes left - x-ray of left foot 4th toe related to non healing diabetic foot ulcer. CPT code The following medication(s) was prescribed: Santyl topical 250 unit/gram ointment ointment topical Apply nickel thick daily to the wound bed and then cover with a dressing as directed in clinic x 30 days starting 10/23/2020 WOUND #1: - T Fourth Wound Laterality: Left oe Cleanser: Soap and Water 1 x Per Day/30 Days Discharge Instructions: May shower and wash wound with dial antibacterial soap and water prior to dressing change. Cleanser: Wound Cleanser 1 x Per Day/30 Days Discharge Instructions: Cleanse the wound with wound cleanser prior to applying a clean dressing using gauze sponges, not tissue or cotton balls. Prim Dressing: Santyl Ointment 1 x Per Day/30 Days ary Discharge Instructions: Apply nickel thick amount to wound bed as instructed Secondary Dressing: Woven Gauze  Sponges 2x2 in 1 x Per Day/30 Days Discharge Instructions: Apply over primary dressing as directed. Secured With: Child psychotherapist, Sterile 2x75 (in/in) 1 x Per Day/30 Days Discharge Instructions: Secure with stretch gauze as directed. 1. Would recommend currently that we going to switch to [facility] which I think will be a better option for him as far as treatment is concerned right now. I do think this will help loosen up some of the remaining eschar. If we get that cleaned away and maybe we can try collagen or something like that to speed up tissue growth. 2. I am also can recommend that he change this daily and monitor for any signs of  infection. If he develops any fevers, chills, nausea, vomiting, or diarrhea I advised that he should go to the ER ASAP I am hopeful that that will not end up being the case but nonetheless I do not think he wants to delay if there is any sign of infection whatsoever. We will see patient back for reevaluation in 1 week here in the clinic. If anything worsens or changes patient will contact our office for additional recommendations. Electronic Signature(s) Signed: 10/23/2020 4:19:12 PM By: Worthy Keeler PA-C Entered By: Worthy Keeler on 10/23/2020 16:19:11 -------------------------------------------------------------------------------- SuperBill Details Patient Name: Date of Service: Patrick Shelton 10/23/2020 Medical Record Number: [id number] Patient Account Number: [id number] Date of Birth/Sex: Treating RN: Nov 07, 1940 (80 y.o. Lorette Ang, Meta.Reding Primary Care Provider: Altheimer, Juanda Bond Other Clinician: Referring Provider: Treating Provider/Extender: Worthy Keeler Altheimer, Docia Chuck in Treatment: 9 Diagnosis Coding ICD-10 Codes Code Description E11.621 Type 2 diabetes mellitus with foot ulcer L97.522 Non-pressure chronic ulcer of other part of left foot with fat layer exposed I89.0 Lymphedema, not elsewhere  classified L97.822 Non-pressure chronic ulcer of other part of left lower leg with fat layer exposed N18.30 Chronic kidney disease, stage 3 unspecified Z79.01 Long term (current) use of anticoagulants Facility Procedures CPT4 Code: [id number] Description: [id number] - DEBRIDE WOUND 1ST 20 SQ CM OR < ICD-10 Diagnosis Description L97.522 Non-pressure chronic ulcer of other part of left foot with fat layer exposed Modifier: Quantity: 1 Physician Procedures : CPT4 Code Description Modifier [id number] - WC PHYS LEVEL 4 - EST PT 25 ICD-10 Diagnosis Description E11.621 Type 2 diabetes mellitus with foot ulcer L97.522 Non-pressure chronic ulcer of other part of left foot with fat layer exposed I89.0  Lymphedema, not elsewhere classified L97.822 Non-pressure chronic ulcer of other part of left lower leg with fat layer exposed Quantity: 1 : [id number] - WC PHYS DEBR WO ANESTH 20 SQ CM ICD-10 Diagnosis Description L97.522 Non-pressure chronic ulcer of other part of left foot with fat layer exposed Quantity: 1 Electronic Signature(s) Signed: 10/23/2020 4:19:30 PM By: Worthy Keeler PA-C Entered By: Worthy Keeler on 10/23/2020 16:19:30

## 2020-10-23 NOTE — Progress Notes (Signed)
MUHAMAD, GAULD ([id number]) Visit Report for 10/23/2020 Arrival Information Details Patient Name: Date of Service: Patrick Shelton 10/23/2020 3:15 PM Medical Record Number: [id number] Patient Account Number: [id number] Date of Birth/Sex: Treating RN: [birth year] (80 y.o. [name] Primary Care Carlie Corpus: Altheimer, Juanda Bond Other Clinician: Referring Nitara Szczerba: Treating Zamara Cozad/Extender: Worthy Keeler Altheimer, Docia Chuck in Treatment: 9 Visit Information History Since Last Visit Added or deleted any medications: No Patient Arrived: Wheel Chair Any new allergies or adverse reactions: No Arrival Time: 15:27 Had a fall or experienced change in No Accompanied By: wife activities of daily living that may affect Transfer Assistance: None risk of falls: Patient Identification Verified: Yes Signs or symptoms of abuse/neglect since last visito No Secondary Verification Process Completed: Yes Hospitalized since last visit: No Patient Requires Transmission-Based Precautions: No Implantable device outside of the clinic excluding No Patient Has Alerts: Yes cellular tissue based products placed in the center Patient Alerts: Patient on Blood Thinner since last visit: L ABI=[place] L TBI=0.34 Has Dressing in Place as Prescribed: Yes Pain Present Now: No Electronic Signature(s) Signed: 10/23/2020 5:25:21 PM By: Deon Pilling Entered By: Deon Pilling on 10/23/2020 15:27:42 -------------------------------------------------------------------------------- Encounter Discharge Information Details Patient Name: Date of Service: Patrick Shelton 10/23/2020 3:15 PM Medical Record Number: [id number] Patient Account Number: [id number] Date of Birth/Sex: Treating RN: 06-19-1940 (80 y.o. [name] Primary Care Babygirl Trager: Altheimer, Juanda Bond Other Clinician: Referring Marlowe Cinquemani: Treating Mahaley Schwering/Extender: Worthy Keeler Altheimer, Docia Chuck in Treatment: 9 Encounter Discharge  Information Items Post Procedure Vitals Discharge Condition: Stable Temperature (F): 97.9 Ambulatory Status: Wheelchair Pulse (bpm): 101 Discharge Destination: Home Respiratory Rate (breaths/min): 20 Transportation: Private Auto Blood Pressure (mmHg): 116/71 Accompanied By: wife Schedule Follow-up Appointment: Yes Clinical Summary of Care: Electronic Signature(s) Signed: 10/23/2020 5:25:21 PM By: Deon Pilling Entered By: Deon Pilling on 10/23/2020 15:57:11 -------------------------------------------------------------------------------- Lower Extremity Assessment Details Patient Name: Date of Service: Patrick Shelton 10/23/2020 3:15 PM Medical Record Number: [id number] Patient Account Number: [id number] Date of Birth/Sex: Treating RN: 03-May-1940 (80 y.o. [name] Primary Care Freddie Dymek: Altheimer, Juanda Bond Other Clinician: Referring Minetta Krisher: Treating Neelah Mannings/Extender: Worthy Keeler Altheimer, Docia Chuck in Treatment: 9 Edema Assessment Assessed: Shirlyn Goltz: Yes] Patrice Paradise: No] Edema: [Left: Ye] [Right: s] Calf Left: Right: Point of Measurement: 38 cm From Medial Instep 47 cm Ankle Left: Right: Point of Measurement: 0 cm From Medial Instep 30 cm Vascular Assessment Pulses: Dorsalis Pedis Palpable: [Left:Yes] Electronic Signature(s) Signed: 10/23/2020 5:25:21 PM By: Deon Pilling Entered By: Deon Pilling on 10/23/2020 15:31:27 -------------------------------------------------------------------------------- [place] Details Patient Name: Date of Service: Patrick Shelton 10/23/2020 3:15 PM Medical Record Number: [id number] Patient Account Number: [id number] Date of Birth/Sex: Treating RN: 1940/12/29 (80 y.o. [name] Primary Care Charleene Callegari: Altheimer, Juanda Bond Other Clinician: Referring Gomer France: Treating Richad Ramsay/Extender: Worthy Keeler Altheimer, Docia Chuck in Treatment: [place] reviewed with  physician Active Inactive Nutrition Nursing Diagnoses: Impaired glucose control: actual or potential Potential for alteratiion in Nutrition/Potential for imbalanced nutrition Goals: Patient/caregiver will maintain therapeutic glucose control Date Initiated: 08/21/2020 Target Resolution Date: 12/06/2020 Goal Status: Active Interventions: Assess HgA1c results as ordered upon admission and as needed Provide education on elevated blood sugars and impact on wound healing Treatment Activities: Patient referred to Primary Care Physician for further nutritional evaluation : 08/21/2020 Notes: 09/18/20: Glucose control ongoing Tissue Oxygenation Nursing Diagnoses: Actual ineffective tissue perfusion; peripheral (select once diagnosis is confirmed) Knowledge  deficit related to disease process and management Goals: Patient/caregiver will verbalize understanding of disease process and disease management Date Initiated: 08/21/2020 Target Resolution Date: 11/08/2020 Goal Status: Active Interventions: Assess patient understanding of disease process and management upon diagnosis and as needed Assess peripheral arterial status upon admission and as needed Provide education on tissue oxygenation and ischemia Treatment Activities: Revascularization procedures : 08/21/2020 T ordered outside of clinic : 08/21/2020 est Notes: Electronic Signature(s) Signed: 10/23/2020 5:25:21 PM By: Deon Pilling Entered By: Deon Pilling on 10/23/2020 15:36:18 -------------------------------------------------------------------------------- Pain Assessment Details Patient Name: Date of Service: Patrick Shelton 10/23/2020 3:15 PM Medical Record Number: [id number] Patient Account Number: [id number] Date of Birth/Sex: Treating RN: 01-Sep-1940 (80 y.o. [name] Primary Care Thursa Emme: Altheimer, Juanda Bond Other Clinician: Referring Tiffany Talarico: Treating Bert Givans/Extender: Worthy Keeler Altheimer, Docia Chuck  in Treatment: 9 Active Problems Location of Pain Severity and Description of Pain Patient Has Paino No Site Locations Rate the pain. Current Pain Level: 0 Pain Management and Medication Current Pain Management: Medication: No Cold Application: No Rest: No Massage: No Activity: No T.E.N.S.: No Heat Application: No Leg drop or elevation: No Is the Current Pain Management Adequate: Adequate How does your wound impact your activities of daily livingo Sleep: No Bathing: No Appetite: No Relationship With Others: No Bladder Continence: No Emotions: No Bowel Continence: No Work: No Toileting: No Drive: No Dressing: No Hobbies: No Electronic Signature(s) Signed: 10/23/2020 5:25:21 PM By: Deon Pilling Entered By: Deon Pilling on 10/23/2020 15:31:05 -------------------------------------------------------------------------------- Patient/Caregiver Education Details Patient Name: Date of Service: Patrick Shelton 8/17/2022andnbsp3:15 PM Medical Record Number: [id number] Patient Account Number: [id number] Date of Birth/Gender: Treating RN: 31-Mar-1940 (79 y.o. [name] Primary Care Physician: Altheimer, Juanda Bond Other Clinician: Referring Physician: Treating Physician/Extender: Worthy Keeler Altheimer, Docia Chuck in Treatment: 9 Education Assessment Education Provided To: Patient Education Topics Provided Elevated Blood Sugar/ Impact on Healing: Handouts: Elevated Blood Sugars: How Do They Affect Wound Healing Methods: Explain/Verbal Responses: Reinforcements needed Electronic Signature(s) Signed: 10/23/2020 5:25:21 PM By: Deon Pilling Entered By: Deon Pilling on 10/23/2020 15:36:39 -------------------------------------------------------------------------------- Wound Assessment Details Patient Name: Date of Service: Patrick Shelton 10/23/2020 3:15 PM Medical Record Number: [id number] Patient Account Number: [id number] Date of  Birth/Sex: Treating RN: Dec 30, 1940 (80 y.o. [name] Primary Care Chenille Toor: Altheimer, Juanda Bond Other Clinician: Referring Ebunoluwa Gernert: Treating Virgene Tirone/Extender: Worthy Keeler Altheimer, Docia Chuck in Treatment: 9 Wound Status Wound Number: 1 Primary Diabetic Wound/Ulcer of the Lower Extremity Etiology: Wound Location: Left T Fourth oe Wound Open Wounding Event: Trauma Status: Status: Date Acquired: 05/13/2020 Comorbid Cataracts, Deep Vein Thrombosis, Hypertension, Peripheral Weeks Of Treatment: 9 History: Venous Disease, Type II Diabetes, Gout, Osteoarthritis, Clustered Wound: No Neuropathy, Received Radiation Pending Amputation On Presentation Photos Wound Measurements Length: (cm) 1.5 Width: (cm) 2 Depth: (cm) 0.1 Area: (cm) 2.356 Volume: (cm) 0.236 % Reduction in Area: -64.9% % Reduction in Volume: -65% Epithelialization: Small (1-33%) Tunneling: No Undermining: No Wound Description Classification: Grade 2 Wound Margin: Distinct, outline attached Exudate Amount: Medium Exudate Type: Serosanguineous Exudate Color: red, brown Foul Odor After Cleansing: No Slough/Fibrino Yes Wound Bed Granulation Amount: None Present (0%) Exposed Structure Necrotic Amount: Large (67-100%) Fascia Exposed: No Necrotic Quality: Eschar, Adherent Slough Fat Layer (Subcutaneous Tissue) Exposed: Yes Tendon Exposed: No Muscle Exposed: No Joint Exposed: No Bone Exposed: No Treatment Notes Wound #1 (Toe Fourth) Wound Laterality: Left Cleanser Soap and Water Discharge Instruction: May shower and wash  wound with dial antibacterial soap and water prior to dressing change. Wound Cleanser Discharge Instruction: Cleanse the wound with wound cleanser prior to applying a clean dressing using gauze sponges, not tissue or cotton balls. Peri-Wound Care Topical Primary Dressing Santyl Ointment Discharge Instruction: Apply nickel thick amount to wound bed as  instructed Secondary Dressing Woven Gauze Sponges 2x2 in Discharge Instruction: Apply over primary dressing as directed. Secured With Conforming Stretch Gauze Bandage, Sterile 2x75 (in/in) Discharge Instruction: Secure with stretch gauze as directed. Compression Wrap Compression Stockings Add-Ons Electronic Signature(s) Signed: 10/23/2020 5:25:21 PM By: Deon Pilling Entered By: Deon Pilling on 10/23/2020 15:51:49 -------------------------------------------------------------------------------- Vitals Details Patient Name: Date of Service: Patrick Shelton 10/23/2020 3:15 PM Medical Record Number: [id number] Patient Account Number: [id number] Date of Birth/Sex: Treating RN: 1940/10/01 (80 y.o. Patrick Shelton, Meta.Reding Primary Care Sharrell Krawiec: Altheimer, Juanda Bond Other Clinician: Referring Casin Federici: Treating Abid Bolla/Extender: Worthy Keeler Altheimer, Docia Chuck in Treatment: 9 Vital Signs Time Taken: 15:27 Temperature (F): 97.9 Pulse (bpm): 101 Respiratory Rate (breaths/min): 20 Blood Pressure (mmHg): 116/71 Capillary Blood Glucose (mg/dl): 138 Reference Range: 80 - 120 mg / dl Electronic Signature(s) Signed: 10/23/2020 5:25:21 PM By: Deon Pilling Entered By: Deon Pilling on 10/23/2020 15:28:54

## 2020-10-30 ENCOUNTER — Encounter (HOSPITAL_BASED_OUTPATIENT_CLINIC_OR_DEPARTMENT_OTHER): Payer: Medicare Other | Admitting: Physician Assistant

## 2020-10-30 ENCOUNTER — Other Ambulatory Visit: Payer: Self-pay

## 2020-10-30 DIAGNOSIS — N183 Chronic kidney disease, stage 3 unspecified: Secondary | ICD-10-CM | POA: Diagnosis not present

## 2020-10-30 DIAGNOSIS — E1122 Type 2 diabetes mellitus with diabetic chronic kidney disease: Secondary | ICD-10-CM | POA: Diagnosis not present

## 2020-10-30 DIAGNOSIS — E11621 Type 2 diabetes mellitus with foot ulcer: Secondary | ICD-10-CM | POA: Diagnosis not present

## 2020-10-30 DIAGNOSIS — L97522 Non-pressure chronic ulcer of other part of left foot with fat layer exposed: Secondary | ICD-10-CM | POA: Diagnosis not present

## 2020-10-30 DIAGNOSIS — L97822 Non-pressure chronic ulcer of other part of left lower leg with fat layer exposed: Secondary | ICD-10-CM | POA: Diagnosis not present

## 2020-10-30 DIAGNOSIS — E11622 Type 2 diabetes mellitus with other skin ulcer: Secondary | ICD-10-CM | POA: Diagnosis not present

## 2020-10-30 NOTE — Progress Notes (Addendum)
RUSHIL, SOLAZZO ([id number]) Visit Report for 10/30/2020 Chief Complaint Document Details Patient Name: Date of Service: Patrick Shelton 10/30/2020 2:45 PM Medical Record Number: [id number] Patient Account Number: [id number] Date of Birth/Sex: Treating RN: [birth year] (80 y.o. [name] Primary Care Provider: Altheimer, Juanda Bond Other Clinician: Referring Provider: Treating Provider/Extender: Worthy Keeler Altheimer, Docia Chuck in Treatment: 10 Information Obtained from: Patient Chief Complaint Left 4th toe ulcer and left LE Ulcer Electronic Signature(s) Signed: 10/30/2020 3:23:02 PM By: Worthy Keeler PA-C Entered By: Worthy Keeler on 10/30/2020 15:23:01 -------------------------------------------------------------------------------- Debridement Details Patient Name: Date of Service: Patrick Shelton 10/30/2020 2:45 PM Medical Record Number: [id number] Patient Account Number: [id number] Date of Birth/Sex: Treating RN: 09-Jun-1940 (80 y.o. [name] Primary Care Provider: Altheimer, Juanda Bond Other Clinician: Referring Provider: Treating Provider/Extender: Worthy Keeler Altheimer, Docia Chuck in Treatment: 10 Debridement Performed for Assessment: Wound #1 Left T Fourth oe Performed By: Clinician Baruch Gouty, RN Debridement Type: Chemical/Enzymatic/Mechanical Agent Used: Santyl Severity of Tissue Pre Debridement: Fat layer exposed Level of Consciousness (Pre-procedure): Awake and Alert Pre-procedure Verification/Time Out No Taken: Bleeding: None Response to Treatment: Procedure was tolerated well Level of Consciousness (Post- Awake and Alert procedure): Post Debridement Measurements of Total Wound Length: (cm) 1.5 Width: (cm) 2 Depth: (cm) 0.1 Volume: (cm) 0.236 Character of Wound/Ulcer Post Debridement: Requires Further Debridement Severity of Tissue Post Debridement: Fat layer exposed Post Procedure Diagnosis Same as  Pre-procedure Electronic Signature(s) Signed: 10/30/2020 5:35:19 PM By: Baruch Gouty RN, BSN Signed: 10/30/2020 6:37:42 PM By: Worthy Keeler PA-C Entered By: Baruch Gouty on 10/30/2020 15:44:38 -------------------------------------------------------------------------------- HPI Details Patient Name: Date of Service: Patrick Shelton 10/30/2020 2:45 PM Medical Record Number: [id number] Patient Account Number: [id number] Date of Birth/Sex: Treating RN: 09/08/1940 (80 y.o. [name] Primary Care Provider: Altheimer, Juanda Bond Other Clinician: Referring Provider: Treating Provider/Extender: Worthy Keeler Altheimer, Docia Chuck in Treatment: 10 History of Present Illness HPI Description: 08/21/2020 upon evaluation today patient presents for initial inspection here in the clinic concerning issues that he has been having actually with the wound on his left leg. However upon evaluation by the time he got into see [place] this wound has healed and seems to be doing quite well. With that being said his main issue currently is actually a wound that is on his toe. This is in fact the left fourth toe. 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 Nonetheless he has been seen by podiatry. He has been seen specifically by an stride foot and ankle specialist. It appears that the provider is Dr. Rosemary Holms nonetheless currently she has trimmed his nails but has not done anything as far as debridement in regard to the toe and again I really think that is probably not in his best interest based on the arterial flow. He does see vein and vascular specialist as well and apparently he is not interested whatsoever in  proceeding with an arteriogram simply due to the fact that he does have stage III kidney disease and is more concerned about conservative's kidneys instead of potentially risking losing kidney function and possibly  still losing the toe or the leg in any way. Either way he tells me that he would "proceed with an amputation in regard to the toe or even leg before I will have a direct procedure to work on the blood flow". He states his kidneys are more important. He is on anticoagulant therapy long-term. This is Eliquis. He is seen with his wife during the office visit today. He does have a history of diabetes mellitus type 2 with a hemoglobin A1c of 8.1 most recent go I do not know the exact time all these records are in [place] which I do not have direct access to. He was a previous smoker but has not smoked for 40 years. 09/04/2020 upon evaluation today patient's toe actually appears to be doing decently well. In fact it is pretty much about the same as what it was previously is most long as of active infection at this time. No fevers, chills, nausea, vomiting, or diarrhea. Patient appears to be showing signs of active infection which is great news. This is staying fairly stable and dry which is exactly what we want. He does have a follow-up appointment with vascular at the end of July. 09/18/2020 upon evaluation today patient actually appears to be doing quite well in regard to his wounds. He has been tolerating the dressing changes without complication. The toe actually is looking better and I think the Betadine is helping to dry this up quite nicely. With that being said he still is having a significant amount of issues with his leg on the left. Specifically he has a blister currently he sees vascular next week for now I am really not going to be wrapping him until we see where things stand from a vascular standpoint and ensure that he is still doing okay. 10/02/2008 patient presents today for follow-up  concerning his leg and toe ulcer. The leg is actually healed which is great news. The toe is still dry and stable some of the eschar starting to peel off a little bit more but in general overall things seem to be doing quite well. He did see Dr. Doren Custard on 09/25/2020. Dr. Doren Custard offered an arteriogram but the patient wanted. His kidneys in the trouble there so he does not want to proceed with any type of intervention as far as an arteriogram is concerned. With that being said for that reason we will go ahead and continue to manage this just on a outpatient basis and the patient is much more pleased with this. 10/23/2020 upon evaluation today patient unfortunately is having fluid draining from around the eschar on the tip of his toe as well as an issue currently with some of the eschar lifting up. Obviously this is no longer stable and do not think the Betadine is continuing good at this point. I discussed all this with him and his wife today. With that being said I think that we need to try to carefully clear away the eschar is much as possible and then subsequently probably use Santyl to help treat this. The patient is in agreement with that plan. I did discuss the risk and benefits particularly in regard to the fact that he does not have good blood flow I explained again that I am not can I do an aggressive debridement here but that I do think he needs some debridement in order to help with what we are seeing currently. The patient voiced understanding. In the end he did decide that he wants to  go forward with the debridement and I went to be careful but nonetheless we do need to try think to get this moving in a better direction if he has any chance of saving the toe. He is already made the decision that if the toe cannot be saved he will proceed with the amputation this will be a below-knee amputation as his blood flow is not good into the leg. He declined the angiogram with vascular due to the fact that  dye would have to be used and that would likely further damage his kidneys he states that his kidneys are more important than the leg. He already has a prosthesis on the right leg. 10/26/2020 upon evaluation today patient's x-ray actually showed evidence of potential for osteomyelitis of the distal tuft where he did have some breakdown of the bone at this location. Subsequently I do believe that the patient probably needs an MRI to see the extent of what is going on. Obviously without being revascularized his only 2 options are really a below-knee amputation or is getting the wound healed and again as we have discussed previously I think that even getting the wound healing is can be a little bit difficult here. Hyperbarics could be of benefit but at the same time I am not certain that even that is going to be sufficient to prevent further breakdown and subsequent in the end amputation. All of this was discussed with patient today as well as his wife. Electronic Signature(s) Signed: 10/30/2020 6:16:38 PM By: Worthy Keeler PA-C Entered By: Worthy Keeler on 10/30/2020 18:16:38 -------------------------------------------------------------------------------- Physical Exam Details Patient Name: Date of Service: Patrick Shelton 10/30/2020 2:45 PM Medical Record Number: [id number] Patient Account Number: [id number] Date of Birth/Sex: Treating RN: 1940/05/07 (80 y.o. [name] Primary Care Provider: Altheimer, Juanda Bond Other Clinician: Referring Provider: Treating Provider/Extender: Worthy Keeler Altheimer, Docia Chuck in Treatment: 62 Constitutional Well-nourished and well-hydrated in no acute distress. Respiratory normal breathing without difficulty. Psychiatric this patient is able to make decisions and demonstrates good insight into disease process. Alert and Oriented x 3. pleasant and cooperative. Notes Upon inspection patient's wound bed showed signs of being somewhat dry  in regard to the eschar although there is some drainage around the edge. He did get the [facility] but not till Monday. This took quite a while. Nonetheless I think that the Santyl is probably still the best option although that in general discoloration of the toe has me concerned that this may not be something that actually ends up being healable. And since he does not want to proceed with the angiography to put in a stent or angioplasty open the vessels due to the concern for dye and the damage to the kidneys that could result I think the best option here is probably good to be either aggressive wound care including hyperbarics as well as antibiotics possibly even IV antibiotics or potentially a below-knee amputation. Electronic Signature(s) Signed: 10/30/2020 6:17:36 PM By: Worthy Keeler PA-C Entered By: Worthy Keeler on 10/30/2020 18:17:35 -------------------------------------------------------------------------------- Physician Orders Details Patient Name: Date of Service: Patrick Shelton 10/30/2020 2:45 PM Medical Record Number: [id number] Patient Account Number: [id number] Date of Birth/Sex: Treating RN: 01/09/1941 (80 y.o. [name] Primary Care Provider: Altheimer, Juanda Bond Other Clinician: Referring Provider: Treating Provider/Extender: Worthy Keeler Altheimer, Docia Chuck in Treatment: 10 Verbal / Phone Orders: No Diagnosis Coding ICD-10 Coding Code Description [phone] Type 2 diabetes mellitus  with foot ulcer L97.522 Non-pressure chronic ulcer of other part of left foot with fat layer exposed I89.0 Lymphedema, not elsewhere classified L97.822 Non-pressure chronic ulcer of other part of left lower leg with fat layer exposed N18.30 Chronic kidney disease, stage 3 unspecified Z79.01 Long term (current) use of anticoagulants Follow-up Appointments ppointment in 1 week. Margarita Grizzle Return A Other: - Halo=Supplies Bathing/ Shower/ Hygiene May shower and wash wound  with soap and water. Edema Control - Lymphedema / SCD / Other Left Lower Extremity Elevate legs to the level of the heart or above for 30 minutes daily and/or when sitting, a frequency of: Moisturize legs daily. Compression stocking or Garment 10-20 mm/Hg pressure to: - to left leg daily, open toe knee high Additional Orders / Instructions Follow Nutritious Diet Wound Treatment Wound #1 - T Fourth oe Wound Laterality: Left Cleanser: Soap and Water 1 x Per Day/30 Days Discharge Instructions: May shower and wash wound with dial antibacterial soap and water prior to dressing change. Cleanser: Wound Cleanser 1 x Per Day/30 Days Discharge Instructions: Cleanse the wound with wound cleanser prior to applying a clean dressing using gauze sponges, not tissue or cotton balls. Prim Dressing: Santyl Ointment 1 x Per Day/30 Days ary Discharge Instructions: Apply nickel thick amount to wound bed as instructed Secondary Dressing: Woven Gauze Sponges 2x2 in 1 x Per Day/30 Days Discharge Instructions: Apply over primary dressing as directed. Secured With: Child psychotherapist, Sterile 2x75 (in/in) 1 x Per Day/30 Days Discharge Instructions: Secure with stretch gauze as directed. 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, chronic kidney disease stage 3 CPT r/o osteomyelitis - , (ICD10 [id number] - Non-pressure chronic ulcer of other part of left foot with fat layer exposed) Electronic Signature(s) Signed: 10/30/2020 5:35:19 PM By: Baruch Gouty RN, BSN Signed: 10/30/2020 6:37:42 PM By: Worthy Keeler PA-C Entered By: Baruch Gouty on 10/30/2020 15:53:02 Prescription 10/30/2020 -------------------------------------------------------------------------------- Lanette Hampshire PA Patient Name: Provider: 04/25/1940 [id number] Date of Birth: NPI#: Jerilynn Mages [id number] Sex: DEA #: [id number] Phone #: License #: [place] Patient Address: [address], [place] [address] [address], [place] [address] [phone] Allergies prednisone; dulaglutide Provider's Orders MRI, lower extremity without contrast left - ICD10: L97.522 - **URGENT** 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 [name] Primary Care Provider: Altheimer, Juanda Bond Other Clinician: Referring Provider: Treating Provider/Extender: Worthy Keeler Altheimer, Docia Chuck in Treatment: 10 Active Problems ICD-10 Encounter Code Description Active Date MDM Diagnosis E11.621 Type 2 diabetes mellitus with foot ulcer 08/21/2020 No Yes L97.522 Non-pressure chronic ulcer of other part of left foot with fat layer exposed 08/21/2020 No Yes I89.0 Lymphedema, not elsewhere classified 09/18/2020 No Yes L97.822 Non-pressure chronic ulcer of other part of left lower leg with fat layer exposed7/13/2022 No Yes N18.30 Chronic kidney disease, stage 3 unspecified 08/21/2020 No Yes Z79.01 Long term (current) use of anticoagulants 08/21/2020 No Yes Inactive Problems Resolved Problems Electronic Signature(s) Signed: 10/30/2020  3:22:50 PM By: Worthy Keeler PA-C Entered By: Worthy Keeler on 10/30/2020 15:22:50 -------------------------------------------------------------------------------- Progress Note Details Patient Name: Date of  Service: Patrick Shelton 10/30/2020 2:45 PM Medical Record Number: [id number] Patient Account Number: [id number] Date of Birth/Sex: Treating RN: Aug 02, 1940 (80 y.o. [name] Primary Care Provider: Altheimer, Juanda Bond Other Clinician: Referring Provider: Treating Provider/Extender: Worthy Keeler Altheimer, Docia Chuck in Treatment: 10 Subjective Chief Complaint Information obtained from Patient Left 4th toe ulcer and left LE Ulcer History of Present Illness (HPI) 08/21/2020 upon evaluation today patient presents for initial inspection here in the clinic concerning issues that he has been having actually with the wound on his left leg. However upon evaluation by the time he got into see [place] this wound has healed and seems to be doing quite well. With that being said his main issue currently is actually a wound that is on his toe. This is in fact the left fourth toe. 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 Nonetheless he has been seen by podiatry. He has been seen specifically by an stride foot and ankle specialist. It appears that the provider is Dr. Rosemary Holms nonetheless currently she has trimmed his nails but has not done anything as far as debridement in regard to the toe and again I really think that is probably not in his best interest based on the arterial flow. He does see vein and vascular specialist as well and apparently he is not interested whatsoever in proceeding with an arteriogram simply due to the fact that he does have stage III kidney disease and is more concerned about conservative's kidneys instead of potentially risking losing kidney function and possibly still losing the toe  or the leg in any way. Either way he tells me that he would "proceed with an amputation in regard to the toe or even leg before I will have a direct procedure to work on the blood flow". He states his kidneys are more important. He is on anticoagulant therapy long-term. This is Eliquis. He is seen with his wife during the office visit today. He does have a history of diabetes mellitus type 2 with a hemoglobin A1c of 8.1 most recent go I do not know the exact time all these records are in [place] which I do not have direct access to. He was a previous smoker but has not smoked for 40 years. 09/04/2020 upon evaluation today patient's toe actually appears to be doing decently well. In fact it is pretty much about the same as what it was previously is most long as of active infection at this time. No fevers, chills, nausea, vomiting, or diarrhea. Patient appears to be showing signs of active infection which is great news. This is staying fairly stable and dry which is exactly what we want. He does have a follow-up appointment with vascular at the end of July. 09/18/2020 upon evaluation today patient actually appears to be doing quite well in regard to his wounds. He has been tolerating the dressing changes without complication. The toe actually is looking better and I think the Betadine is helping to dry this up quite nicely. With  that being said he still is having a significant amount of issues with his leg on the left. Specifically he has a blister currently he sees vascular next week for now I am really not going to be wrapping him until we see where things stand from a vascular standpoint and ensure that he is still doing okay. 10/02/2008 patient presents today for follow-up concerning his leg and toe ulcer. The leg is actually healed which is great news. The toe is still dry and stable some of the eschar starting to peel off a little bit more but in general overall things seem to be doing quite well. He did  see Dr. Doren Custard on 09/25/2020. Dr. Doren Custard offered an arteriogram but the patient wanted. His kidneys in the trouble there so he does not want to proceed with any type of intervention as far as an arteriogram is concerned. With that being said for that reason we will go ahead and continue to manage this just on a outpatient basis and the patient is much more pleased with this. 10/23/2020 upon evaluation today patient unfortunately is having fluid draining from around the eschar on the tip of his toe as well as an issue currently with some of the eschar lifting up. Obviously this is no longer stable and do not think the Betadine is continuing good at this point. I discussed all this with him and his wife today. With that being said I think that we need to try to carefully clear away the eschar is much as possible and then subsequently probably use Santyl to help treat this. The patient is in agreement with that plan. I did discuss the risk and benefits particularly in regard to the fact that he does not have good blood flow I explained again that I am not can I do an aggressive debridement here but that I do think he needs some debridement in order to help with what we are seeing currently. The patient voiced understanding. In the end he did decide that he wants to go forward with the debridement and I went to be careful but nonetheless we do need to try think to get this moving in a better direction if he has any chance of saving the toe. He is already made the decision that if the toe cannot be saved he will proceed with the amputation this will be a below-knee amputation as his blood flow is not good into the leg. He declined the angiogram with vascular due to the fact that dye would have to be used and that would likely further damage his kidneys he states that his kidneys are more important than the leg. He already has a prosthesis on the right leg. 10/26/2020 upon evaluation today patient's x-ray  actually showed evidence of potential for osteomyelitis of the distal tuft where he did have some breakdown of the bone at this location. Subsequently I do believe that the patient probably needs an MRI to see the extent of what is going on. Obviously without being revascularized his only 2 options are really a below-knee amputation or is getting the wound healed and again as we have discussed previously I think that even getting the wound healing is can be a little bit difficult here. Hyperbarics could be of benefit but at the same time I am not certain that even that is going to be sufficient to prevent further breakdown and subsequent in the end amputation. All of this was discussed with patient today as well as  his wife. Objective Constitutional Well-nourished and well-hydrated in no acute distress. Vitals Time Taken: 2:59 PM, Temperature: 98.1 F, Pulse: 88 bpm, Respiratory Rate: 20 breaths/min, Blood Pressure: 146/69 mmHg, Capillary Blood Glucose: 78 mg/dl. Respiratory normal breathing without difficulty. Psychiatric this patient is able to make decisions and demonstrates good insight into disease process. Alert and Oriented x 3. pleasant and cooperative. General Notes: Upon inspection patient's wound bed showed signs of being somewhat dry in regard to the eschar although there is some drainage around the edge. He did get the [facility] but not till Monday. This took quite a while. Nonetheless I think that the Santyl is probably still the best option although that in general discoloration of the toe has me concerned that this may not be something that actually ends up being healable. And since he does not want to proceed with the angiography to put in a stent or angioplasty open the vessels due to the concern for dye and the damage to the kidneys that could result I think the best option here is probably good to be either aggressive wound care including hyperbarics as well as antibiotics  possibly even IV antibiotics or potentially a below-knee amputation. Integumentary (Hair, Skin) Wound #1 status is Open. Original cause of wound was Trauma. The date acquired was: 05/13/2020. The wound has been in treatment 10 weeks. The wound is located on the Left T Fourth. The wound measures 1.5cm length x 2cm width x 0.1cm depth; 2.356cm^2 area and 0.236cm^3 volume. There is Fat Layer oe (Subcutaneous Tissue) exposed. There is no tunneling or undermining noted. There is a medium amount of serosanguineous drainage noted. The wound margin is distinct with the outline attached to the wound base. There is small (1-33%) pink granulation within the wound bed. There is a large (67-100%) amount of necrotic tissue within the wound bed including Eschar and Adherent Slough. Assessment Active Problems ICD-10 Type 2 diabetes mellitus with foot ulcer Non-pressure chronic ulcer of other part of left foot with fat layer exposed Lymphedema, not elsewhere classified Non-pressure chronic ulcer of other part of left lower leg with fat layer exposed Chronic kidney disease, stage 3 unspecified Long term (current) use of anticoagulants Procedures Wound #1 Pre-procedure diagnosis of Wound #1 is a Diabetic Wound/Ulcer of the Lower Extremity located on the Left T Fourth .Severity of Tissue Pre Debridement is: oe Fat layer exposed. There was a Chemical/Enzymatic/Mechanical debridement performed by Baruch Gouty, RN.Marland Kitchen Agent used was [employer]. There was no bleeding. The procedure was tolerated well. Post Debridement Measurements: 1.5cm length x 2cm width x 0.1cm depth; 0.236cm^3 volume. Character of Wound/Ulcer Post Debridement requires further debridement. Severity of Tissue Post Debridement is: Fat layer exposed. Post procedure Diagnosis Wound #1: Same as Pre-Procedure Plan Follow-up Appointments: Return Appointment in 1 week. Margarita Grizzle Other: - [employer]=Supplies Bathing/ Shower/ Hygiene: May shower and wash wound  with soap and water. Edema Control - Lymphedema / SCD / Other: Elevate legs to the level of the heart or above for 30 minutes daily and/or when sitting, a frequency of: Moisturize legs daily. 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 Cleanser: Wound Cleanser 1 x Per Day/30 Days Discharge Instructions: Cleanse the wound with wound cleanser prior to applying a clean dressing using gauze sponges, not tissue or cotton balls. Prim Dressing: Santyl Ointment 1 x Per Day/30 Days ary Discharge Instructions: Apply nickel thick amount to wound bed as instructed Secondary Dressing: Woven Gauze Sponges 2x2 in 1 x Per Day/30 Days Discharge Instructions: Apply over primary dressing as directed. Secured With: Child psychotherapist, Sterile 2x75 (in/in) 1 x Per Day/30 Days Discharge Instructions: Secure with stretch gauze as directed. 1. Would recommend currently that we go ahead and continue with the Santyl for the time being and the patient is in agreement the plan. This includes the use of the saline moistened gauze over top and then roll gauze to secure in place. 2. I am also going to recommend a stat MRI for the patient to evaluate for osteomyelitis and get a confirmation here so we can try to get something started ASAP if we are to consider hyperbarics this needs to be sooner rather than later if we have any chance of saving his extremity. 3. I am also can recommend that we have the patient if he is positive  on the MRI for infection likely is going to start with antibiotics possibly even be sent for IV antibiotics. We will see how things go. With all that being said in the meantime I am going go ahead and see him next week to continue to monitor for how things are progressing. We will see patient back for reevaluation in 1 week here in the clinic. If anything worsens or changes patient will contact our office for additional recommendations. Electronic Signature(s) Signed: 10/30/2020 6:19:10 PM By: Worthy Keeler PA-C Entered By: Worthy Keeler on 10/30/2020 18:19:10 -------------------------------------------------------------------------------- SuperBill Details Patient Name: Date of Service: Patrick Shelton 10/30/2020 Medical Record Number: [id number] Patient Account Number: [id number] Date of Birth/Sex: Treating RN: 15-Jun-1940 (80 y.o. [name] Primary Care Provider: Altheimer, Juanda Bond Other Clinician: Referring Provider: Treating Provider/Extender: Worthy Keeler Altheimer, Docia Chuck in Treatment: 10 Diagnosis Coding ICD-10 Codes Code Description [id number] Type 2 diabetes mellitus with foot ulcer L97.522 Non-pressure chronic ulcer of other part of left foot with fat layer exposed I89.0 Lymphedema, not elsewhere classified L97.822 Non-pressure chronic ulcer of other part of left lower leg with fat layer exposed N18.30 Chronic kidney disease, stage 3 unspecified Z79.01 Long term (current) use of anticoagulants Facility Procedures CPT4 Code: [id number] Description: [phone] - DEBRIDE W/O ANES NON SELECT Modifier: Quantity: 1 Physician Procedures : CPT4 Code Description Modifier [id number] - WC PHYS LEVEL 4 - EST PT ICD-10 Diagnosis Description E11.621 Type 2 diabetes mellitus with foot ulcer L97.522 Non-pressure chronic ulcer of other part of left foot with fat layer exposed I89.0 Lymphedema,  not elsewhere classified L97.822 Non-pressure chronic ulcer of other part  of left lower leg with fat layer exposed Quantity: 1 Electronic Signature(s) Signed: 10/30/2020 6:19:24 PM By: Worthy Keeler PA-C Previous Signature: 10/30/2020 5:35:19 PM Version By: Baruch Gouty RN, BSN Entered By: Worthy Keeler on 10/30/2020 18:19:23

## 2020-10-31 ENCOUNTER — Other Ambulatory Visit (HOSPITAL_COMMUNITY): Payer: Self-pay | Admitting: Physician Assistant

## 2020-10-31 ENCOUNTER — Other Ambulatory Visit (HOSPITAL_BASED_OUTPATIENT_CLINIC_OR_DEPARTMENT_OTHER): Payer: Self-pay | Admitting: Physician Assistant

## 2020-10-31 DIAGNOSIS — L97522 Non-pressure chronic ulcer of other part of left foot with fat layer exposed: Secondary | ICD-10-CM

## 2020-10-31 NOTE — Progress Notes (Signed)
Patrick Shelton, Patrick Shelton ([id number]) Visit Report for 10/30/2020 Arrival Information Details Patient Name: Date of Service: Patrick Shelton 10/30/2020 2:45 PM Medical Record Number: [id number] Patient Account Number: [id number] Date of Birth/Sex: Treating RN: 06-09-1940 (80 y.o. [name] Primary Care Anne-Marie Genson: Altheimer, Juanda Bond Other Clinician: Referring Isabeau Mccalla: Treating Dariann Huckaba/Extender: Worthy Keeler Altheimer, Docia Chuck in Treatment: 10 Visit Information History Since Last Visit Added or deleted any medications: No Patient Arrived: Wheel Chair Any new allergies or adverse reactions: No Arrival Time: 14:58 Had a fall or experienced change in No Accompanied By: wife activities of daily living that may affect Transfer Assistance: Manual risk of falls: Patient Identification Verified: Yes Signs or symptoms of abuse/neglect since last visito No Secondary Verification Process Completed: Yes Hospitalized since last visit: No Patient Requires Transmission-Based Precautions: No Implantable device outside of the clinic excluding No Patient Has Alerts: Yes cellular tissue based products placed in the center Patient Alerts: Patient on Blood Thinner since last visit: L ABI=[place] L TBI=0.34 Has Dressing in Place as Prescribed: Yes Pain Present Now: Yes Electronic Signature(s) Signed: 10/31/2020 7:44:17 AM By: Sandre Kitty Entered By: Sandre Kitty on 10/30/2020 14:59:22 -------------------------------------------------------------------------------- Encounter Discharge Information Details Patient Name: Date of Service: Patrick Shelton 10/30/2020 2:45 PM Medical Record Number: [id number] Patient Account Number: [id number] Date of Birth/Sex: Treating RN: 1940-10-09 (80 y.o. [name] Primary Care Milianna Ericsson: Altheimer, Juanda Bond Other Clinician: Referring Manan Olmo: Treating Jaliya Siegmann/Extender: Worthy Keeler Altheimer, Docia Chuck in Treatment:  10 Encounter Discharge Information Items Post Procedure Vitals Discharge Condition: Stable Temperature (F): 98.7 Ambulatory Status: Wheelchair Pulse (bpm): 88 Discharge Destination: Home Respiratory Rate (breaths/min): 18 Transportation: Private Auto Blood Pressure (mmHg): 146/69 Accompanied By: spouse Schedule Follow-up Appointment: Yes Clinical Summary of Care: Patient Declined Electronic Signature(s) Signed: 10/30/2020 5:35:19 PM By: Baruch Gouty RN, BSN Entered By: Baruch Gouty on 10/30/2020 16:02:54 -------------------------------------------------------------------------------- Lower Extremity Assessment Details Patient Name: Date of Service: Patrick Shelton 10/30/2020 2:45 PM Medical Record Number: [id number] Patient Account Number: [id number] Date of Birth/Sex: Treating RN: [birth year] (80 y.o. [name] Primary Care Ruberta Holck: Altheimer, Juanda Bond Other Clinician: Referring Kaitlyn Skowron: Treating Betty Likes/Extender: Worthy Keeler Altheimer, Docia Chuck in Treatment: 10 Edema Assessment Assessed: Shirlyn Goltz: No] Patrice Paradise: No] Edema: [Left: Ye] [Right: s] Calf Left: Right: Point of Measurement: 38 cm From Medial Instep 43 cm Ankle Left: Right: Point of Measurement: 0 cm From Medial Instep 25 cm Vascular Assessment Pulses: Dorsalis Pedis Palpable: [Left:No] Electronic Signature(s) Signed: 10/30/2020 5:35:19 PM By: Baruch Gouty RN, BSN Entered By: Baruch Gouty on 10/30/2020 15:33:12 -------------------------------------------------------------------------------- Multi Wound Chart Details Patient Name: Date of Service: Patrick Shelton 10/30/2020 2:45 PM Medical Record Number: [id number] Patient Account Number: [id number] Date of Birth/Sex: Treating RN: 05-Mar-1941 (80 y.o. [name] Primary Care Aydee Mcnew: Altheimer, Juanda Bond Other Clinician: Referring Issam Carlyon: Treating Zaeda Mcferran/Extender: Worthy Keeler Altheimer, Docia Chuck  in Treatment: 10 Vital Signs Height(in): Capillary Blood Glucose(mg/dl): 55 Weight(lbs): Pulse(bpm): 66 Body Mass Index(BMI): Blood Pressure(mmHg): 146/69 Temperature(F): 98.1 Respiratory Rate(breaths/min): 20 Photos: [1:Left T Fourth oe] [N/A:N/A N/A] Wound Location: [1:Trauma] [N/A:N/A] Wounding Event: [1:Diabetic Wound/Ulcer of the Lower] [N/A:N/A] Primary Etiology: [1:Extremity Cataracts, Deep Vein Thrombosis,] [N/A:N/A] Comorbid History: [1:Hypertension, Peripheral Venous Disease, Type II Diabetes, Gout, Osteoarthritis, Neuropathy, Received Radiation 05/13/2020] [N/A:N/A] Date Acquired: [1:10] [N/A:N/A] Weeks of Treatment: [1:Open] [N/A:N/A] Wound Status: [1:Yes] [N/A:N/A] Pending A mputation on Presentation: [1:1.5x2x0.1] [N/A:N/A] Measurements L x W x D (cm) [1:2.356] [  N/A:N/A] A (cm) : rea [1:0.236] [N/A:N/A] Volume (cm) : [1:-64.90%] [N/A:N/A] % Reduction in A rea: [1:-65.00%] [N/A:N/A] % Reduction in Volume: [1:Grade 2] [N/A:N/A] Classification: [1:Medium] [N/A:N/A] Exudate A mount: [1:Serosanguineous] [N/A:N/A] Exudate Type: [1:red, brown] [N/A:N/A] Exudate Color: [1:Distinct, outline attached] [N/A:N/A] Wound Margin: [1:Small (1-33%)] [N/A:N/A] Granulation A mount: [1:Pink] [N/A:N/A] Granulation Quality: [1:Large (67-100%)] [N/A:N/A] Necrotic A mount: [1:Eschar, Adherent Slough] [N/A:N/A] Necrotic Tissue: [1:Fat Layer (Subcutaneous Tissue): Yes N/A] Exposed Structures: [1:Fascia: No Tendon: No Muscle: No Joint: No Bone: No Small (1-33%)] [N/A:N/A] Epithelialization: [1:Chemical/Enzymatic/Mechanical] [N/A:N/A] Debridement: [1:N/A] [N/A:N/A] Instrument: [1:None] [N/A:N/A] Bleeding: Debridement Treatment Response: Procedure was tolerated well [N/A:N/A] Post Debridement Measurements L x 1.5x2x0.1 [N/A:N/A] W x D (cm) [1:0.236] [N/A:N/A] Post Debridement Volume: (cm) [1:Debridement] [N/A:N/A] Treatment Notes Wound #1 (Toe Fourth) Wound Laterality:  Left Cleanser Soap and Water Discharge Instruction: May shower and wash wound with dial antibacterial soap and water prior to dressing change. Wound Cleanser Discharge Instruction: Cleanse the wound with wound cleanser prior to applying a clean dressing using gauze sponges, not tissue or cotton balls. Peri-Wound Care Topical Primary Dressing Santyl Ointment Discharge Instruction: Apply nickel thick amount to wound bed as instructed Secondary Dressing Woven Gauze Sponges 2x2 in Discharge Instruction: Apply over primary dressing as directed. Secured With Conforming Stretch Gauze Bandage, Sterile 2x75 (in/in) Discharge Instruction: Secure with stretch gauze as directed. Compression Wrap Compression Stockings Add-Ons Electronic Signature(s) Signed: 10/30/2020 5:35:19 PM By: Baruch Gouty RN, BSN Entered By: Baruch Gouty on 10/30/2020 16:13:01 -------------------------------------------------------------------------------- Multi-Disciplinary Care Plan Details Patient Name: Date of Service: Patrick Shelton 10/30/2020 2:45 PM Medical Record Number: [id number] Patient Account Number: [id number] Date of Birth/Sex: Treating RN: Jan 08, 1941 (80 y.o. [name] Primary Care Imogene Gravelle: Altheimer, Juanda Bond Other Clinician: Referring Jedediah Noda: Treating Ebony Yorio/Extender: Worthy Keeler Altheimer, Docia Chuck in Treatment: [place] reviewed with physician Active Inactive Nutrition Nursing Diagnoses: Impaired glucose control: actual or potential Potential for alteratiion in Nutrition/Potential for imbalanced nutrition Goals: Patient/caregiver will maintain therapeutic glucose control Date Initiated: 08/21/2020 Target Resolution Date: 12/06/2020 Goal Status: Active Interventions: Assess HgA1c results as ordered upon admission and as needed Provide education on elevated blood sugars and impact on wound healing Treatment Activities: Patient  referred to Primary Care Physician for further nutritional evaluation : 08/21/2020 Notes: 09/18/20: Glucose control ongoing Tissue Oxygenation Nursing Diagnoses: Actual ineffective tissue perfusion; peripheral (select once diagnosis is confirmed) Knowledge deficit related to disease process and management Goals: Patient/caregiver will verbalize understanding of disease process and disease management Date Initiated: 08/21/2020 Target Resolution Date: 11/08/2020 Goal Status: Active Interventions: Assess patient understanding of disease process and management upon diagnosis and as needed Assess peripheral arterial status upon admission and as needed Provide education on tissue oxygenation and ischemia Treatment Activities: Revascularization procedures : 08/21/2020 T ordered outside of clinic : 08/21/2020 est Notes: Wound/Skin Impairment Nursing Diagnoses: Impaired tissue integrity Knowledge deficit related to ulceration/compromised skin integrity Goals: Patient/caregiver will verbalize understanding of skin care regimen Date Initiated: 08/21/2020 Target Resolution Date: 11/27/2020 Goal Status: Active Ulcer/skin breakdown will have a volume reduction of 30% by week 4 Date Initiated: 08/21/2020 Date Inactivated: 10/02/2020 Target Resolution Date: 10/02/2020 Unmet Reason: PAD, refusing agram, Goal Status: Unmet continue wound care Ulcer/skin breakdown will have a volume reduction of 50% by week 8 Date Initiated: 10/02/2020 Date Inactivated: 10/23/2020 Target Resolution Date: 10/30/2020 Unmet Reason: see wound Goal Status: Unmet measurement. Interventions: Assess patient/caregiver ability to obtain necessary supplies Assess patient/caregiver ability to perform ulcer/skin care regimen upon admission  and as needed Assess ulceration(s) every visit Provide education on ulcer and skin care Treatment Activities: Skin care regimen initiated : 08/21/2020 Topical wound management initiated :  08/21/2020 Notes: 09/18/20: New wound today, target date extended. Electronic Signature(s) Signed: 10/30/2020 5:35:19 PM By: Baruch Gouty RN, BSN Entered By: Baruch Gouty on 10/30/2020 15:34:55 -------------------------------------------------------------------------------- Pain Assessment Details Patient Name: Date of Service: Patrick Shelton 10/30/2020 2:45 PM Medical Record Number: [id number] Patient Account Number: [id number] Date of Birth/Sex: Treating RN: April 07, 1940 (80 y.o. [name] Primary Care Domnic Vantol: Altheimer, Juanda Bond Other Clinician: Referring Aysen Shieh: Treating Dmani Mizer/Extender: Worthy Keeler Altheimer, Docia Chuck in Treatment: 10 Active Problems Location of Pain Severity and Description of Pain Patient Has Paino Yes Site Locations Pain Location: Generalized Pain With Dressing Change: No Duration of the Pain. Constant / Intermittento Intermittent Rate the pain. Current Pain Level: 6 Character of Pain Describe the Pain: Aching Pain Management and Medication Current Pain Management: Notes reports general overall pain and aching in legs Electronic Signature(s) Signed: 10/30/2020 5:35:19 PM By: Baruch Gouty RN, BSN Entered By: Baruch Gouty on 10/30/2020 15:28:58 -------------------------------------------------------------------------------- Patient/Caregiver Education Details Patient Name: Date of Service: Patrick Shelton 8/24/2022andnbsp2:45 PM Medical Record Number: [id number] Patient Account Number: [id number] Date of Birth/Gender: Treating RN: Feb 04, 1941 (80 y.o. [name] Primary Care Physician: Altheimer, Juanda Bond Other Clinician: Referring Physician: Treating Physician/Extender: Worthy Keeler Altheimer, Docia Chuck in Treatment: 10 Education Assessment Education Provided To: Patient Education Topics Provided Elevated Blood Sugar/ Impact on Healing: Methods: Explain/Verbal Responses:  Reinforcements needed, State content correctly Tissue Oxygenation: Methods: Explain/Verbal Responses: Reinforcements needed, State content correctly Wound/Skin Impairment: Methods: Explain/Verbal Responses: Reinforcements needed, State content correctly Electronic Signature(s) Signed: 10/30/2020 5:35:19 PM By: Baruch Gouty RN, BSN Entered By: Baruch Gouty on 10/30/2020 15:35:23 -------------------------------------------------------------------------------- Wound Assessment Details Patient Name: Date of Service: Patrick Shelton 10/30/2020 2:45 PM Medical Record Number: [id number] Patient Account Number: [id number] Date of Birth/Sex: Treating RN: 1940-11-21 (80 y.o. [name] Primary Care Tannon Peerson: Altheimer, Juanda Bond Other Clinician: Referring Keyan Folson: Treating Babette Stum/Extender: Worthy Keeler Altheimer, Docia Chuck in Treatment: 10 Wound Status Wound Number: 1 Primary Diabetic Wound/Ulcer of the Lower Extremity Etiology: Wound Location: Left T Fourth oe Wound Open Wounding Event: Trauma Status: Date Acquired: 05/13/2020 Comorbid Cataracts, Deep Vein Thrombosis, Hypertension, Peripheral Venous Weeks Of Treatment: 10 History: Disease, Type II Diabetes, Gout, Osteoarthritis, Neuropathy, Clustered Wound: No Received Radiation Pending Amputation On Presentation Photos Wound Measurements Length: (cm) 1.5 Width: (cm) 2 Depth: (cm) 0.1 Area: (cm) 2.356 Volume: (cm) 0.236 % Reduction in Area: -64.9% % Reduction in Volume: -65% Epithelialization: Small (1-33%) Tunneling: No Undermining: No Wound Description Classification: Grade 2 Wound Margin: Distinct, outline attached Exudate Amount: Medium Exudate Type: Serosanguineous Exudate Color: red, brown Foul Odor After Cleansing: No Slough/Fibrino Yes Wound Bed Granulation Amount: Small (1-33%) Exposed Structure Granulation Quality: Pink Fascia Exposed: No Necrotic Amount: Large (67-100%) Fat  Layer (Subcutaneous Tissue) Exposed: Yes Necrotic Quality: Eschar, Adherent Slough Tendon Exposed: No Muscle Exposed: No Joint Exposed: No Bone Exposed: No Treatment Notes Wound #1 (Toe Fourth) Wound Laterality: Left Cleanser Soap and Water Discharge Instruction: May shower and wash wound with dial antibacterial soap and water prior to dressing change. Wound Cleanser Discharge Instruction: Cleanse the wound with wound cleanser prior to applying a clean dressing using gauze sponges, not tissue or cotton balls. Peri-Wound Care Topical Primary Dressing Santyl Ointment Discharge Instruction: Apply nickel thick amount to wound  bed as instructed Secondary Dressing Woven Gauze Sponges 2x2 in Discharge Instruction: Apply over primary dressing as directed. Secured With Conforming Stretch Gauze Bandage, Sterile 2x75 (in/in) Discharge Instruction: Secure with stretch gauze as directed. Compression Wrap Compression Stockings Add-Ons Electronic Signature(s) Signed: 10/30/2020 5:35:19 PM By: Baruch Gouty RN, BSN Entered By: Baruch Gouty on 10/30/2020 15:34:00 -------------------------------------------------------------------------------- Vitals Details Patient Name: Date of Service: Patrick Shelton 10/30/2020 2:45 PM Medical Record Number: [id number] Patient Account Number: [id number] Date of Birth/Sex: Treating RN: [birth year] (80 y.o. [name] Primary Care Armie Moren: Altheimer, Juanda Bond Other Clinician: Referring Jahdai Padovano: Treating Jameila Keeny/Extender: Worthy Keeler Altheimer, Docia Chuck in Treatment: 10 Vital Signs Time Taken: 14:59 Temperature (F): 98.1 Pulse (bpm): 88 Respiratory Rate (breaths/min): 20 Blood Pressure (mmHg): 146/69 Capillary Blood Glucose (mg/dl): 78 Reference Range: 80 - 120 mg / dl Electronic Signature(s) Signed: 10/31/2020 7:44:17 AM By: Sandre Kitty Entered By: Sandre Kitty on 10/30/2020 14:59:45

## 2020-11-04 ENCOUNTER — Telehealth: Payer: Self-pay | Admitting: Cardiovascular Disease

## 2020-11-04 NOTE — Telephone Encounter (Signed)
Called and spoke to patient. Patient states that he has had the chills and shakes and states that he has been very cold despite having the heat on. He is wanting to know if this is related to his eliquis. The patient has been on eliquis for a while now and denies having any S/Sx of bleeding. He states that he currently has an infection in his toe. Asked patient if he has ran a fever and he is not sure. Instructed the patient to reach out to PCP If his Sx continue and to let us know if he develops any S/Sx of bleeding. He verbalized understanding and thanked me for the call.

## 2020-11-04 NOTE — Telephone Encounter (Signed)
Pt c/o medication issue:  1. Name of Medication:  apixaban (ELIQUIS) 5 MG TABS tablet  2. How are you currently taking this medication (dosage and times per day)?  1 tablet (5 mg total) by mouth 2 (two) times daily  3. Are you having a reaction (difficulty breathing--STAT)?  See below  4. What is your medication issue?   Patient states this medication is causing extreme chills and shakes. Patient states he has the heat on in his house, but he's still very cold.

## 2020-11-06 ENCOUNTER — Other Ambulatory Visit: Payer: Self-pay

## 2020-11-06 ENCOUNTER — Emergency Department (HOSPITAL_COMMUNITY): Payer: Medicare Other

## 2020-11-06 ENCOUNTER — Inpatient Hospital Stay (HOSPITAL_COMMUNITY)
Admission: EM | Admit: 2020-11-06 | Discharge: 2020-12-07 | DRG: 239 | Disposition: E | Payer: Medicare Other | Source: Ambulatory Visit | Attending: Internal Medicine | Admitting: Internal Medicine

## 2020-11-06 ENCOUNTER — Telehealth: Payer: Self-pay

## 2020-11-06 ENCOUNTER — Encounter (HOSPITAL_BASED_OUTPATIENT_CLINIC_OR_DEPARTMENT_OTHER): Payer: Medicare Other | Admitting: Physician Assistant

## 2020-11-06 ENCOUNTER — Encounter (HOSPITAL_COMMUNITY): Payer: Self-pay

## 2020-11-06 DIAGNOSIS — N183 Chronic kidney disease, stage 3 unspecified: Secondary | ICD-10-CM | POA: Diagnosis present

## 2020-11-06 DIAGNOSIS — E1165 Type 2 diabetes mellitus with hyperglycemia: Secondary | ICD-10-CM | POA: Diagnosis not present

## 2020-11-06 DIAGNOSIS — T17908A Unspecified foreign body in respiratory tract, part unspecified causing other injury, initial encounter: Secondary | ICD-10-CM | POA: Diagnosis not present

## 2020-11-06 DIAGNOSIS — R0902 Hypoxemia: Secondary | ICD-10-CM | POA: Diagnosis not present

## 2020-11-06 DIAGNOSIS — R059 Cough, unspecified: Secondary | ICD-10-CM

## 2020-11-06 DIAGNOSIS — J690 Pneumonitis due to inhalation of food and vomit: Secondary | ICD-10-CM | POA: Diagnosis not present

## 2020-11-06 DIAGNOSIS — T17900A Unspecified foreign body in respiratory tract, part unspecified causing asphyxiation, initial encounter: Secondary | ICD-10-CM | POA: Diagnosis not present

## 2020-11-06 DIAGNOSIS — Z794 Long term (current) use of insulin: Secondary | ICD-10-CM

## 2020-11-06 DIAGNOSIS — E875 Hyperkalemia: Secondary | ICD-10-CM | POA: Diagnosis not present

## 2020-11-06 DIAGNOSIS — G4089 Other seizures: Secondary | ICD-10-CM | POA: Diagnosis not present

## 2020-11-06 DIAGNOSIS — Z87891 Personal history of nicotine dependence: Secondary | ICD-10-CM

## 2020-11-06 DIAGNOSIS — E119 Type 2 diabetes mellitus without complications: Secondary | ICD-10-CM

## 2020-11-06 DIAGNOSIS — E872 Acidosis, unspecified: Secondary | ICD-10-CM | POA: Diagnosis not present

## 2020-11-06 DIAGNOSIS — M1990 Unspecified osteoarthritis, unspecified site: Secondary | ICD-10-CM | POA: Diagnosis present

## 2020-11-06 DIAGNOSIS — M109 Gout, unspecified: Secondary | ICD-10-CM | POA: Diagnosis present

## 2020-11-06 DIAGNOSIS — I447 Left bundle-branch block, unspecified: Secondary | ICD-10-CM | POA: Diagnosis not present

## 2020-11-06 DIAGNOSIS — I517 Cardiomegaly: Secondary | ICD-10-CM | POA: Diagnosis not present

## 2020-11-06 DIAGNOSIS — E11649 Type 2 diabetes mellitus with hypoglycemia without coma: Secondary | ICD-10-CM | POA: Diagnosis present

## 2020-11-06 DIAGNOSIS — Z833 Family history of diabetes mellitus: Secondary | ICD-10-CM

## 2020-11-06 DIAGNOSIS — T886XXA Anaphylactic reaction due to adverse effect of correct drug or medicament properly administered, initial encounter: Secondary | ICD-10-CM | POA: Diagnosis not present

## 2020-11-06 DIAGNOSIS — E871 Hypo-osmolality and hyponatremia: Secondary | ICD-10-CM | POA: Diagnosis present

## 2020-11-06 DIAGNOSIS — M79675 Pain in left toe(s): Secondary | ICD-10-CM | POA: Diagnosis not present

## 2020-11-06 DIAGNOSIS — I70262 Atherosclerosis of native arteries of extremities with gangrene, left leg: Secondary | ICD-10-CM | POA: Diagnosis present

## 2020-11-06 DIAGNOSIS — I129 Hypertensive chronic kidney disease with stage 1 through stage 4 chronic kidney disease, or unspecified chronic kidney disease: Secondary | ICD-10-CM | POA: Diagnosis present

## 2020-11-06 DIAGNOSIS — E1152 Type 2 diabetes mellitus with diabetic peripheral angiopathy with gangrene: Principal | ICD-10-CM | POA: Diagnosis present

## 2020-11-06 DIAGNOSIS — L03116 Cellulitis of left lower limb: Secondary | ICD-10-CM | POA: Diagnosis not present

## 2020-11-06 DIAGNOSIS — R0602 Shortness of breath: Secondary | ICD-10-CM | POA: Diagnosis not present

## 2020-11-06 DIAGNOSIS — J9601 Acute respiratory failure with hypoxia: Secondary | ICD-10-CM | POA: Diagnosis not present

## 2020-11-06 DIAGNOSIS — G928 Other toxic encephalopathy: Secondary | ICD-10-CM | POA: Diagnosis not present

## 2020-11-06 DIAGNOSIS — E8809 Other disorders of plasma-protein metabolism, not elsewhere classified: Secondary | ICD-10-CM | POA: Diagnosis not present

## 2020-11-06 DIAGNOSIS — Z20822 Contact with and (suspected) exposure to covid-19: Secondary | ICD-10-CM | POA: Diagnosis not present

## 2020-11-06 DIAGNOSIS — Z89511 Acquired absence of right leg below knee: Secondary | ICD-10-CM

## 2020-11-06 DIAGNOSIS — Z7901 Long term (current) use of anticoagulants: Secondary | ICD-10-CM

## 2020-11-06 DIAGNOSIS — G931 Anoxic brain damage, not elsewhere classified: Secondary | ICD-10-CM | POA: Diagnosis not present

## 2020-11-06 DIAGNOSIS — E1122 Type 2 diabetes mellitus with diabetic chronic kidney disease: Secondary | ICD-10-CM | POA: Diagnosis present

## 2020-11-06 DIAGNOSIS — E11628 Type 2 diabetes mellitus with other skin complications: Secondary | ICD-10-CM

## 2020-11-06 DIAGNOSIS — D631 Anemia in chronic kidney disease: Secondary | ICD-10-CM | POA: Diagnosis present

## 2020-11-06 DIAGNOSIS — E861 Hypovolemia: Secondary | ICD-10-CM | POA: Diagnosis not present

## 2020-11-06 DIAGNOSIS — I4821 Permanent atrial fibrillation: Secondary | ICD-10-CM | POA: Diagnosis present

## 2020-11-06 DIAGNOSIS — E11621 Type 2 diabetes mellitus with foot ulcer: Secondary | ICD-10-CM | POA: Diagnosis not present

## 2020-11-06 DIAGNOSIS — N1832 Chronic kidney disease, stage 3b: Secondary | ICD-10-CM | POA: Diagnosis present

## 2020-11-06 DIAGNOSIS — E873 Alkalosis: Secondary | ICD-10-CM | POA: Diagnosis not present

## 2020-11-06 DIAGNOSIS — Z66 Do not resuscitate: Secondary | ICD-10-CM | POA: Diagnosis not present

## 2020-11-06 DIAGNOSIS — L97526 Non-pressure chronic ulcer of other part of left foot with bone involvement without evidence of necrosis: Secondary | ICD-10-CM | POA: Diagnosis present

## 2020-11-06 DIAGNOSIS — R579 Shock, unspecified: Secondary | ICD-10-CM | POA: Diagnosis not present

## 2020-11-06 DIAGNOSIS — Z888 Allergy status to other drugs, medicaments and biological substances status: Secondary | ICD-10-CM

## 2020-11-06 DIAGNOSIS — E785 Hyperlipidemia, unspecified: Secondary | ICD-10-CM | POA: Diagnosis present

## 2020-11-06 DIAGNOSIS — N179 Acute kidney failure, unspecified: Secondary | ICD-10-CM | POA: Diagnosis present

## 2020-11-06 DIAGNOSIS — R5381 Other malaise: Secondary | ICD-10-CM | POA: Diagnosis not present

## 2020-11-06 DIAGNOSIS — Z8249 Family history of ischemic heart disease and other diseases of the circulatory system: Secondary | ICD-10-CM

## 2020-11-06 DIAGNOSIS — J9602 Acute respiratory failure with hypercapnia: Secondary | ICD-10-CM | POA: Diagnosis not present

## 2020-11-06 DIAGNOSIS — R0600 Dyspnea, unspecified: Secondary | ICD-10-CM

## 2020-11-06 DIAGNOSIS — E7220 Disorder of urea cycle metabolism, unspecified: Secondary | ICD-10-CM | POA: Diagnosis not present

## 2020-11-06 DIAGNOSIS — T4275XA Adverse effect of unspecified antiepileptic and sedative-hypnotic drugs, initial encounter: Secondary | ICD-10-CM | POA: Diagnosis not present

## 2020-11-06 DIAGNOSIS — Z9911 Dependence on respirator [ventilator] status: Secondary | ICD-10-CM

## 2020-11-06 DIAGNOSIS — R34 Anuria and oliguria: Secondary | ICD-10-CM | POA: Diagnosis not present

## 2020-11-06 DIAGNOSIS — E878 Other disorders of electrolyte and fluid balance, not elsewhere classified: Secondary | ICD-10-CM | POA: Diagnosis not present

## 2020-11-06 DIAGNOSIS — Z6837 Body mass index (BMI) 37.0-37.9, adult: Secondary | ICD-10-CM

## 2020-11-06 DIAGNOSIS — Z79899 Other long term (current) drug therapy: Secondary | ICD-10-CM

## 2020-11-06 DIAGNOSIS — L97522 Non-pressure chronic ulcer of other part of left foot with fat layer exposed: Secondary | ICD-10-CM | POA: Diagnosis not present

## 2020-11-06 DIAGNOSIS — R001 Bradycardia, unspecified: Secondary | ICD-10-CM | POA: Diagnosis not present

## 2020-11-06 DIAGNOSIS — E1142 Type 2 diabetes mellitus with diabetic polyneuropathy: Secondary | ICD-10-CM | POA: Diagnosis present

## 2020-11-06 DIAGNOSIS — K7200 Acute and subacute hepatic failure without coma: Secondary | ICD-10-CM | POA: Diagnosis not present

## 2020-11-06 DIAGNOSIS — I872 Venous insufficiency (chronic) (peripheral): Secondary | ICD-10-CM | POA: Diagnosis present

## 2020-11-06 DIAGNOSIS — Z978 Presence of other specified devices: Secondary | ICD-10-CM | POA: Diagnosis not present

## 2020-11-06 DIAGNOSIS — I468 Cardiac arrest due to other underlying condition: Secondary | ICD-10-CM | POA: Diagnosis not present

## 2020-11-06 DIAGNOSIS — K567 Ileus, unspecified: Secondary | ICD-10-CM

## 2020-11-06 DIAGNOSIS — R4020 Unspecified coma: Secondary | ICD-10-CM | POA: Diagnosis not present

## 2020-11-06 DIAGNOSIS — Z9641 Presence of insulin pump (external) (internal): Secondary | ICD-10-CM | POA: Diagnosis present

## 2020-11-06 DIAGNOSIS — M869 Osteomyelitis, unspecified: Secondary | ICD-10-CM | POA: Diagnosis present

## 2020-11-06 DIAGNOSIS — E1169 Type 2 diabetes mellitus with other specified complication: Secondary | ICD-10-CM | POA: Diagnosis present

## 2020-11-06 DIAGNOSIS — I10 Essential (primary) hypertension: Secondary | ICD-10-CM | POA: Diagnosis not present

## 2020-11-06 DIAGNOSIS — L089 Local infection of the skin and subcutaneous tissue, unspecified: Secondary | ICD-10-CM | POA: Diagnosis present

## 2020-11-06 DIAGNOSIS — I469 Cardiac arrest, cause unspecified: Secondary | ICD-10-CM | POA: Diagnosis not present

## 2020-11-06 DIAGNOSIS — Z86711 Personal history of pulmonary embolism: Secondary | ICD-10-CM | POA: Diagnosis present

## 2020-11-06 LAB — COMPREHENSIVE METABOLIC PANEL
ALT: 31 U/L (ref 0–44)
AST: 31 U/L (ref 15–41)
Albumin: 2.6 g/dL — ABNORMAL LOW (ref 3.5–5.0)
Alkaline Phosphatase: 74 U/L (ref 38–126)
Anion gap: 13 (ref 5–15)
BUN: 51 mg/dL — ABNORMAL HIGH (ref 8–23)
CO2: 26 mmol/L (ref 22–32)
Calcium: 8.9 mg/dL (ref 8.9–10.3)
Chloride: 97 mmol/L — ABNORMAL LOW (ref 98–111)
Creatinine, Ser: 2.35 mg/dL — ABNORMAL HIGH (ref 0.61–1.24)
GFR, Estimated: 27 mL/min — ABNORMAL LOW (ref >60)
Glucose, Bld: 117 mg/dL — ABNORMAL HIGH (ref 70–99)
Potassium: 4 mmol/L (ref 3.5–5.1)
Sodium: 136 mmol/L (ref 135–145)
Total Bilirubin: 0.2 mg/dL — ABNORMAL LOW (ref 0.3–1.2)
Total Protein: 7.6 g/dL (ref 6.5–8.1)

## 2020-11-06 LAB — CBC WITH DIFFERENTIAL/PLATELET
Abs Immature Granulocytes: 0.13 10*3/uL — ABNORMAL HIGH (ref 0.00–0.07)
Basophils Absolute: 0 10*3/uL (ref 0.0–0.1)
Basophils Relative: 0 %
Eosinophils Absolute: 0.1 10*3/uL (ref 0.0–0.5)
Eosinophils Relative: 1 %
HCT: 37 % — ABNORMAL LOW (ref 39.0–52.0)
Hemoglobin: 12.3 g/dL — ABNORMAL LOW (ref 13.0–17.0)
Immature Granulocytes: 1 %
Lymphocytes Relative: 18 %
Lymphs Abs: 3.6 10*3/uL (ref 0.7–4.0)
MCH: 30.8 pg (ref 26.0–34.0)
MCHC: 33.2 g/dL (ref 30.0–36.0)
MCV: 92.5 fL (ref 80.0–100.0)
Monocytes Absolute: 1.3 10*3/uL — ABNORMAL HIGH (ref 0.1–1.0)
Monocytes Relative: 7 %
Neutro Abs: 14.8 10*3/uL — ABNORMAL HIGH (ref 1.7–7.7)
Neutrophils Relative %: 73 %
Platelets: 280 10*3/uL (ref 150–400)
RBC: 4 MIL/uL — ABNORMAL LOW (ref 4.22–5.81)
RDW: 13.9 % (ref 11.5–15.5)
WBC: 20 10*3/uL — ABNORMAL HIGH (ref 4.0–10.5)
nRBC: 0 % (ref 0.0–0.2)

## 2020-11-06 LAB — RESP PANEL BY RT-PCR (FLU A&B, COVID) ARPGX2
Influenza A by PCR: NEGATIVE
Influenza B by PCR: NEGATIVE
SARS Coronavirus 2 by RT PCR: NEGATIVE

## 2020-11-06 LAB — LACTIC ACID, PLASMA: Lactic Acid, Venous: 1.4 mmol/L (ref 0.5–1.9)

## 2020-11-06 NOTE — ED Notes (Signed)
Patient states he has low blood sugar. His personal glucometer states his sugar is 77 which is down from the 90 twenty minutes ago. Patient given two apple juices

## 2020-11-06 NOTE — Progress Notes (Addendum)
Patrick Shelton ([id number]) Visit Report for 10/20/2020 Chief Complaint Document Details Patient Name: Date of Service: Patrick Shelton 10/29/2020 2:30 PM Medical Record Number: [id number] Patient Account Number: [id number] Date of Birth/Sex: Treating RN: February 01, 1941 (80 y.o. Patrick Shelton Primary Care Provider: Altheimer, Juanda Bond Other Clinician: Referring Provider: Treating Provider/Extender: Worthy Keeler Altheimer, Docia Chuck in Treatment: 11 Information Obtained from: Patient Chief Complaint Left 4th toe ulcer and left LE Ulcer Electronic Signature(s) Signed: 10/30/2020 2:30:41 PM By: Worthy Keeler PA-C Entered By: Worthy Keeler on 10/23/2020 14:30:41 -------------------------------------------------------------------------------- HPI Details Patient Name: Date of Service: Patrick Shelton M 10/11/2020 2:30 PM Medical Record Number: [id number] Patient Account Number: [id number] Date of Birth/Sex: Treating RN: 1940/09/20 (80 y.o. Patrick Shelton Primary Care Provider: Altheimer, Juanda Bond Other Clinician: Referring Provider: Treating Provider/Extender: Worthy Keeler Altheimer, Docia Chuck in Treatment: 11 History of Present Illness HPI Description: 08/21/2020 upon evaluation today patient presents for initial inspection here in the clinic concerning issues that he has been having actually with the wound on his left leg. However upon evaluation by the time he got into see [place] this wound has healed and seems to be doing quite well. With that being said his main issue currently is actually a wound that is on his toe. This is in fact the left fourth toe. 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 Nonetheless he has been seen by podiatry. He has been seen specifically by an stride foot and ankle specialist. It appears that the provider is Dr. Rosemary Holms nonetheless currently she has trimmed his nails but has not done anything as far as debridement in regard to the toe and again I really think that is probably not in his best interest based on the arterial flow. He does see vein and vascular specialist as well and apparently he is not interested whatsoever in proceeding with an arteriogram simply due to the fact that he does have stage III kidney disease and is more concerned about conservative's kidneys instead of potentially risking losing kidney function and possibly still losing the toe or the leg in any way. Either way he tells me that he would "proceed with an amputation in regard to the toe or even leg before I will have a direct procedure to work on the blood flow". He states his kidneys are more important. He is on anticoagulant therapy long-term. This is Eliquis. He is seen with his wife during the office visit today. He does have a history of diabetes mellitus type 2 with a hemoglobin A1c of 8.1 most recent go I do not know the exact time all these records are in [place] which I do not have direct access to. He was a previous smoker but has not smoked for 40 years. 09/04/2020 upon evaluation today patient's toe actually appears to be doing decently well. In fact it is pretty much about the same as what it was previously is most long as of active infection at this time. No fevers, chills, nausea, vomiting, or diarrhea. Patient appears to be showing signs of active infection which is great news. This is staying fairly stable  and dry which is exactly what we want. He does have a follow-up appointment with vascular at the end of July. 09/18/2020 upon evaluation today patient actually appears to be doing quite well in regard to his wounds. He has been tolerating the dressing changes  without complication. The toe actually is looking better and I think the Betadine is helping to dry this up quite nicely. With that being said he still is having a significant amount of issues with his leg on the left. Specifically he has a blister currently he sees vascular next week for now I am really not going to be wrapping him until we see where things stand from a vascular standpoint and ensure that he is still doing okay. 10/02/2008 patient presents today for follow-up concerning his leg and toe ulcer. The leg is actually healed which is great news. The toe is still dry and stable some of the eschar starting to peel off a little bit more but in general overall things seem to be doing quite well. He did see Dr. Doren Custard on 09/25/2020. Dr. Doren Custard offered an arteriogram but the patient wanted. His kidneys in the trouble there so he does not want to proceed with any type of intervention as far as an arteriogram is concerned. With that being said for that reason we will go ahead and continue to manage this just on a outpatient basis and the patient is much more pleased with this. 10/23/2020 upon evaluation today patient unfortunately is having fluid draining from around the eschar on the tip of his toe as well as an issue currently with some of the eschar lifting up. Obviously this is no longer stable and do not think the Betadine is continuing good at this point. I discussed all this with him and his wife today. With that being said I think that we need to try to carefully clear away the eschar is much as possible and then subsequently probably use Santyl to help treat this. The patient is in agreement with that plan. I did discuss the risk and benefits particularly in regard to the fact that he does not have good blood flow I explained again that I am not can I do an aggressive debridement here but that I do think he needs some debridement in order to help with what we are seeing currently. The patient  voiced understanding. In the end he did decide that he wants to go forward with the debridement and I went to be careful but nonetheless we do need to try think to get this moving in a better direction if he has any chance of saving the toe. He is already made the decision that if the toe cannot be saved he will proceed with the amputation this will be a below-knee amputation as his blood flow is not good into the leg. He declined the angiogram with vascular due to the fact that dye would have to be used and that would likely further damage his kidneys he states that his kidneys are more important than the leg. He already has a prosthesis on the right leg. 10/26/2020 upon evaluation today patient's x-ray actually showed evidence of potential for osteomyelitis of the distal tuft where he did have some breakdown of the bone at this location. Subsequently I do believe that the patient probably needs an MRI to see the extent of what is going on. Obviously without being revascularized his only 2 options are really a below-knee amputation or is getting the wound  healed and again as we have discussed previously I think that even getting the wound healing is can be a little bit difficult here. Hyperbarics could be of benefit but at the same time I am not certain that even that is going to be sufficient to prevent further breakdown and subsequent in the end amputation. All of this was discussed with patient today as well as his wife. 10/29/2020 unfortunately upon evaluation today the patient appears to be doing significantly worse compared to where he has been. He is actually having a little bit in the way of chills he also tells me in general he is just not feeling as good as he has been in the past. Unfortunately the toe is also looking worse and shows signs of having advanced compared to the last time I saw him. Overall I feel like that we are heading in a direction which is not nearly as good as where things  have been. The patient does not have any fever but does have increased erythema coupled with the generalized malaise as well as chills that he is feeling and stomach upset I feel like that he is on the verge of becoming septic. I do believe he should go to the ER for further evaluation and treatment. Electronic Signature(s) Signed: 10/17/2020 3:18:33 PM By: Worthy Keeler PA-C Entered By: Worthy Keeler on 10/18/2020 15:18:33 -------------------------------------------------------------------------------- Physical Exam Details Patient Name: Date of Service: Patrick Shelton 10/23/2020 2:30 PM Medical Record Number: [id number] Patient Account Number: [id number] Date of Birth/Sex: Treating RN: [birth year] (80 y.o. Patrick Shelton Primary Care Provider: Altheimer, Juanda Bond Other Clinician: Referring Provider: Treating Provider/Extender: Worthy Keeler Altheimer, Docia Chuck in Treatment: 25 Constitutional Well-nourished and well-hydrated in no acute distress. Respiratory normal breathing without difficulty. Psychiatric this patient is able to make decisions and demonstrates good insight into disease process. Alert and Oriented x 3. pleasant and cooperative. Notes Upon inspection patient's wound bed actually showed signs of significantly worsening compared to even last week there is increase in the cyanosis the toe is extremely cool to touch and there does appear to be a lot of skin sloughing off of this area as well. All of which is a fairly significant change even from last week. For that reason I do believe that the patient would benefit from going to the ER for evaluation and treatment I think he probably needs some IV antibiotics but to be honest it is probably also going to be something that is going require amputation. He did not want to reestablish blood flow previously because of the risk to his kidneys and therefore amputation is really his only option below the knee he is  aware of this and he and Dr. Doren Custard have discussed this extensively. Electronic Signature(s) Signed: 10/26/2020 3:19:22 PM By: Worthy Keeler PA-C Entered By: Worthy Keeler on 10/08/2020 15:19:22 -------------------------------------------------------------------------------- Physician Orders Details Patient Name: Date of Service: Patrick Shelton 10/17/2020 2:30 PM Medical Record Number: [id number] Patient Account Number: [id number] Date of Birth/Sex: Treating RN: 04-Mar-1941 (80 y.o. [name] Primary Care Provider: Altheimer, Juanda Bond Other Clinician: Referring Provider: Treating Provider/Extender: Worthy Keeler Altheimer, Docia Chuck in Treatment: 11 Verbal / Phone Orders: No Diagnosis Coding ICD-10 Coding Code Description E11.621 Type 2 diabetes mellitus with foot ulcer L97.522 Non-pressure chronic ulcer of other part of left foot with fat layer exposed I89.0 Lymphedema, not elsewhere classified L97.822 Non-pressure chronic ulcer of other part of  left lower leg with fat layer exposed N18.30 Chronic kidney disease, stage 3 unspecified Z79.01 Long term (current) use of anticoagulants Follow-up Appointments ppointment in: - Go to [facility] Emergency Department for evaluation of left 4th toe wound, foot, and leg infection. T ED to consult ell Return A vascular surgeon Dr. Scot Dock or one of [facility] fellows [facility]. Call wound center if you need a follow up appointment after you are discharged from hospital. Wound Treatment Wound #1 - T Fourth oe Wound Laterality: Left Prim Dressing: KerraCel Ag Gelling Fiber Dressing, 0.75x12 Ribbon (silver alginate) ary Discharge Instructions: Apply silver alginate to wound bed as instructed Secondary Dressing: Woven Gauze Sponges 2x2 in Discharge Instructions: Apply over primary dressing as directed. Secured With: 7M Medipore H Soft Cloth Surgical T 4 x 2 (in/yd) ape Discharge Instructions: Secure dressing with tape  as directed. Electronic Signature(s) Signed: 10/27/2020 5:21:16 PM By: Worthy Keeler PA-C Signed: 10/14/2020 6:06:32 PM By: Deon Pilling Entered By: Deon Pilling on 10/26/2020 15:13:44 -------------------------------------------------------------------------------- Problem List Details Patient Name: Date of Service: Patrick Shelton M 10/08/2020 2:30 PM Medical Record Number: [id number] Patient Account Number: [id number] Date of Birth/Sex: Treating RN: Sep 27, 1940 (80 y.o. Patrick Shelton Primary Care Provider: Altheimer, Juanda Bond Other Clinician: Referring Provider: Treating Provider/Extender: Worthy Keeler Altheimer, Docia Chuck in Treatment: 11 Active Problems ICD-10 Encounter Code Description Active Date MDM Diagnosis E11.621 Type 2 diabetes mellitus with foot ulcer 08/21/2020 No Yes L97.522 Non-pressure chronic ulcer of other part of left foot with fat layer exposed 08/21/2020 No Yes I89.0 Lymphedema, not elsewhere classified 09/18/2020 No Yes L97.822 Non-pressure chronic ulcer of other part of left lower leg with fat layer exposed7/13/2022 No Yes N18.30 Chronic kidney disease, stage 3 unspecified 08/21/2020 No Yes Z79.01 Long term (current) use of anticoagulants 08/21/2020 No Yes Inactive Problems Resolved Problems Electronic Signature(s) Signed: 11/01/2020 2:30:26 PM By: Worthy Keeler PA-C Entered By: Worthy Keeler on 10/22/2020 14:30:26 -------------------------------------------------------------------------------- Progress Note Details Patient Name: Date of Service: Patrick Shelton M 10/24/2020 2:30 PM Medical Record Number: [id number] Patient Account Number: [id number] Date of Birth/Sex: Treating RN: 25-Jul-1940 (80 y.o. Patrick Shelton Primary Care Provider: Altheimer, Juanda Bond Other Clinician: Referring Provider: Treating Provider/Extender: Worthy Keeler Altheimer, Docia Chuck in Treatment: 11 Subjective Chief Complaint Information obtained  from Patient Left 4th toe ulcer and left LE Ulcer History of Present Illness (HPI) 08/21/2020 upon evaluation today patient presents for initial inspection here in the clinic concerning issues that he has been having actually with the wound on his left leg. However upon evaluation by the time he got into see [place] this wound has healed and seems to be doing quite well. With that being said his main issue currently is actually a wound that is on his toe. This is in fact the left fourth toe. 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 Nonetheless he has been seen by podiatry. He has been seen specifically by an stride foot and ankle specialist. It appears that the provider is Dr. Rosemary Holms nonetheless currently she has trimmed his nails but has not done anything as far as debridement in  regard to the toe and again I really think that is probably not in his best interest based on the arterial flow. He does see vein and vascular specialist as well and apparently he is not interested whatsoever in proceeding with an arteriogram simply due to the fact that he does have stage III kidney disease and is more concerned about conservative's kidneys instead of potentially risking losing kidney function and possibly still losing the toe or the leg in any way. Either way he tells me that he would "proceed with an amputation in regard to the toe or even leg before I will have a direct procedure to work on the blood flow". He states his kidneys are more important. He is on anticoagulant therapy long-term. This is Eliquis. He is seen with his wife during the office visit today. He does have a history of diabetes mellitus type 2 with a  hemoglobin A1c of 8.1 most recent go I do not know the exact time all these records are in [place] which I do not have direct access to. He was a previous smoker but has not smoked for 40 years. 09/04/2020 upon evaluation today patient's toe actually appears to be doing decently well. In fact it is pretty much about the same as what it was previously is most long as of active infection at this time. No fevers, chills, nausea, vomiting, or diarrhea. Patient appears to be showing signs of active infection which is great news. This is staying fairly stable and dry which is exactly what we want. He does have a follow-up appointment with vascular at the end of July. 09/18/2020 upon evaluation today patient actually appears to be doing quite well in regard to his wounds. He has been tolerating the dressing changes without complication. The toe actually is looking better and I think the Betadine is helping to dry this up quite nicely. With that being said he still is having a significant amount of issues with his leg on the left. Specifically he has a blister currently he sees vascular next week for now I am really not going to be wrapping him until we see where things stand from a vascular standpoint and ensure that he is still doing okay. 10/02/2008 patient presents today for follow-up concerning his leg and toe ulcer. The leg is actually healed which is great news. The toe is still dry and stable some of the eschar starting to peel off a little bit more but in general overall things seem to be doing quite well. He did see Dr. Doren Custard on 09/25/2020. Dr. Doren Custard offered an arteriogram but the patient wanted. His kidneys in the trouble there so he does not want to proceed with any type of intervention as far as an arteriogram is concerned. With that being said for that reason we will go ahead and continue to manage this just on a outpatient basis and the patient is much more pleased with this. 10/23/2020 upon  evaluation today patient unfortunately is having fluid draining from around the eschar on the tip of his toe as well as an issue currently with some of the eschar lifting up. Obviously this is no longer stable and do not think the Betadine is continuing good at this point. I discussed all this with him and his wife today. With that being said I think that we need to try to carefully clear away the eschar is much as possible and then subsequently probably use Santyl to help treat this. The patient is  in agreement with that plan. I did discuss the risk and benefits particularly in regard to the fact that he does not have good blood flow I explained again that I am not can I do an aggressive debridement here but that I do think he needs some debridement in order to help with what we are seeing currently. The patient voiced understanding. In the end he did decide that he wants to go forward with the debridement and I went to be careful but nonetheless we do need to try think to get this moving in a better direction if he has any chance of saving the toe. He is already made the decision that if the toe cannot be saved he will proceed with the amputation this will be a below-knee amputation as his blood flow is not good into the leg. He declined the angiogram with vascular due to the fact that dye would have to be used and that would likely further damage his kidneys he states that his kidneys are more important than the leg. He already has a prosthesis on the right leg. 10/26/2020 upon evaluation today patient's x-ray actually showed evidence of potential for osteomyelitis of the distal tuft where he did have some breakdown of the bone at this location. Subsequently I do believe that the patient probably needs an MRI to see the extent of what is going on. Obviously without being revascularized his only 2 options are really a below-knee amputation or is getting the wound healed and again as we have discussed  previously I think that even getting the wound healing is can be a little bit difficult here. Hyperbarics could be of benefit but at the same time I am not certain that even that is going to be sufficient to prevent further breakdown and subsequent in the end amputation. All of this was discussed with patient today as well as his wife. 10/20/2020 unfortunately upon evaluation today the patient appears to be doing significantly worse compared to where he has been. He is actually having a little bit in the way of chills he also tells me in general he is just not feeling as good as he has been in the past. Unfortunately the toe is also looking worse and shows signs of having advanced compared to the last time I saw him. Overall I feel like that we are heading in a direction which is not nearly as good as where things have been. The patient does not have any fever but does have increased erythema coupled with the generalized malaise as well as chills that he is feeling and stomach upset I feel like that he is on the verge of becoming septic. I do believe he should go to the ER for further evaluation and treatment. Objective Constitutional Well-nourished and well-hydrated in no acute distress. Vitals Time Taken: 2:41 PM, Height: 71 in, Source: Stated, Weight: 260 lbs, Source: Stated, BMI: 36.3, Temperature: 98.2 F, Pulse: 80 bpm, Respiratory Rate: 22 breaths/min, Blood Pressure: 149/67 mmHg. Respiratory normal breathing without difficulty. Psychiatric this patient is able to make decisions and demonstrates good insight into disease process. Alert and Oriented x 3. pleasant and cooperative. General Notes: Upon inspection patient's wound bed actually showed signs of significantly worsening compared to even last week there is increase in the cyanosis the toe is extremely cool to touch and there does appear to be a lot of skin sloughing off of this area as well. All of which is a fairly significant change  even  from last week. For that reason I do believe that the patient would benefit from going to the ER for evaluation and treatment I think he probably needs some IV antibiotics but to be honest it is probably also going to be something that is going require amputation. He did not want to reestablish blood flow previously because of the risk to his kidneys and therefore amputation is really his only option below the knee he is aware of this and he and Dr. Doren Custard have discussed this extensively. Integumentary (Hair, Skin) Wound #1 status is Open. Original cause of wound was Trauma. The date acquired was: 05/13/2020. The wound has been in treatment 11 weeks. The wound is located on the Left T Fourth. The wound measures 2.3cm length x 2.4cm width x 0.1cm depth; 4.335cm^2 area and 0.434cm^3 volume. There is Fat Layer oe (Subcutaneous Tissue) exposed. There is a medium amount of serosanguineous drainage noted. The wound margin is distinct with the outline attached to the wound base. There is small (1-33%) pink granulation within the wound bed. There is a large (67-100%) amount of necrotic tissue within the wound bed including Eschar and Adherent Slough. Assessment Active Problems ICD-10 Type 2 diabetes mellitus with foot ulcer Non-pressure chronic ulcer of other part of left foot with fat layer exposed Lymphedema, not elsewhere classified Non-pressure chronic ulcer of other part of left lower leg with fat layer exposed Chronic kidney disease, stage 3 unspecified Long term (current) use of anticoagulants Plan Follow-up Appointments: Return Appointment in: - Go to [facility] Emergency Department for evaluation of left 4th toe wound, foot, and leg infection. T ED to consult vascular ell surgeon Dr. Scot Dock or one of [facility] fellows at [facility]. Call wound center if you need a follow up appointment after you are discharged from hospital. WOUND #1: - T Fourth Wound Laterality: Left oe Prim  Dressing: KerraCel Ag Gelling Fiber Dressing, 0.75x12 Ribbon (silver alginate) ary Discharge Instructions: Apply silver alginate to wound bed as instructed Secondary Dressing: Woven Gauze Sponges 2x2 in Discharge Instructions: Apply over primary dressing as directed. Secured With: 44M Medipore H Soft Cloth Surgical T 4 x 2 (in/yd) ape Discharge Instructions: Secure dressing with tape as directed. 1. At this point my suggestion is good to be that the patient go to the ER at [facility] for further evaluation and treatment. He is very much aware that since he does not want to reestablish blood flow in the leg due to risk of the contrast dye to his kidneys that he is going to require a below-knee amputation. I think this is probably the point where that is going to become necessary. At the ER I do think that Dr. Doren Custard should be consulted since that is the physician he has been seen in order to proceed with this amputation. 2. Also feel the patient is probably can need IV antibiotics to stabilize as well as of course checking blood work and otherwise in order to see where things stand. Again my biggest concern at this point is I do not want him to become septic and the toes gotten significantly worse coupled with the more systemic symptoms he is having such as nausea, generalized malaise, and chills even though he does not have a fever this all has me concerned about this overall infection worsening. We will see the patient back for follow-up visit as needed in the future no I think right now he is going require definitive amputation. Electronic Signature(s) Signed: 10/13/2020 3:20:38  PM By: Worthy Keeler PA-C Entered By: Worthy Keeler on 10/28/2020 15:20:37 -------------------------------------------------------------------------------- SuperBill Details Patient Name: Date of Service: Patrick Shelton 10/19/2020 Medical Record Number: [id number] Patient Account Number: [id number] Date of  Birth/Sex: Treating RN: April 27, 1940 (80 y.o. Lorette Ang, Meta.Reding Primary Care Provider: Altheimer, Juanda Bond Other Clinician: Referring Provider: Treating Provider/Extender: Worthy Keeler Altheimer, Docia Chuck in Treatment: 11 Diagnosis Coding ICD-10 Codes Code Description E11.621 Type 2 diabetes mellitus with foot ulcer L97.522 Non-pressure chronic ulcer of other part of left foot with fat layer exposed I89.0 Lymphedema, not elsewhere classified L97.822 Non-pressure chronic ulcer of other part of left lower leg with fat layer exposed N18.30 Chronic kidney disease, stage 3 unspecified Z79.01 Long term (current) use of anticoagulants Facility Procedures CPT4 Code: [id number] Description: 99214 - WOUND CARE VISIT-LEV 4 EST PT Modifier: Quantity: 1 Physician Procedures : CPT4 Code Description Modifier [id number] - WC PHYS LEVEL 4 - EST PT ICD-10 Diagnosis Description E11.621 Type 2 diabetes mellitus with foot ulcer L97.522 Non-pressure chronic ulcer of other part of left foot with fat layer exposed I89.0 Lymphedema,  not elsewhere classified L97.822 Non-pressure chronic ulcer of other part of left lower leg with fat layer exposed Quantity: 1 Electronic Signature(s) Signed: 11/03/2020 3:22:21 PM By: Worthy Keeler PA-C Entered By: Worthy Keeler on 10/23/2020 15:22:20

## 2020-11-06 NOTE — ED Provider Notes (Signed)
Emergency Medicine Provider Triage Evaluation Note  Patrick Shelton , a 80 y.o. male  was evaluated in triage.  Pt complains of left toe pain, shortness of breath. Patient has an ongoing wound on the fourth toe of his left foot. He presents from the wound care center today as they feel his wound has progressed, risking possible osteomyelitis, bacteremia and that he likely needs a BKA at this time. Patient also has cough and SOB for days, as well as chills / fever. He denies chest pain, abdominal pain.  Review of Systems  Positive: As above Negative: As above  Physical Exam  BP (!) 151/84 (BP Location: Left Arm)   Pulse (!) 109   Temp 99.9 F (37.7 C) (Oral)   Resp (!) 22   Ht '5\' 10"'$  (1.778 m)   Wt 117.9 kg   SpO2 100%   BMI 37.29 kg/m  Gen:   Awake, no distress   Resp:  Wheezing, rhonchi, cough, otherwise normal effort, 4L O2 by nasal cannula MSK:   BKA on right, dark gangrenous looking left fourth toe with swelling, redness, tenderness surrounding. Pulses 1+ in the left foot Other:    Medical Decision Making  Medically screening exam initiated at 7:59 PM.  Appropriate orders placed.  Patrick Shelton was informed that the remainder of the evaluation will be completed by another provider, this initial triage assessment does not replace that evaluation, and the importance of remaining in the ED until their evaluation is complete.  Toe pain likely infectious, SOB   Patrick Shelton 11/05/2020 2003    Truddie Hidden, MD 11/07/20 [phone]

## 2020-11-06 NOTE — ED Triage Notes (Signed)
Brought in by [facility] - c/o left 4th toe pain and wound infection.  Pt was seen at wound clinic today and was advised to come to ED for further work up.  Pt advised IV abx.

## 2020-11-06 NOTE — Telephone Encounter (Signed)
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 The wound care center has advised him to report to the emergency department for an amputation. He calls to ask where he should go. Advised [facility]. Patient verbalizes understanding.

## 2020-11-07 ENCOUNTER — Encounter (HOSPITAL_COMMUNITY): Payer: TRICARE For Life (TFL)

## 2020-11-07 ENCOUNTER — Inpatient Hospital Stay (HOSPITAL_COMMUNITY): Payer: Medicare Other

## 2020-11-07 DIAGNOSIS — Z89511 Acquired absence of right leg below knee: Secondary | ICD-10-CM

## 2020-11-07 DIAGNOSIS — N183 Chronic kidney disease, stage 3 unspecified: Secondary | ICD-10-CM

## 2020-11-07 DIAGNOSIS — Q046 Congenital cerebral cysts: Secondary | ICD-10-CM | POA: Diagnosis not present

## 2020-11-07 DIAGNOSIS — G253 Myoclonus: Secondary | ICD-10-CM | POA: Diagnosis not present

## 2020-11-07 DIAGNOSIS — T17908A Unspecified foreign body in respiratory tract, part unspecified causing other injury, initial encounter: Secondary | ICD-10-CM | POA: Diagnosis not present

## 2020-11-07 DIAGNOSIS — D631 Anemia in chronic kidney disease: Secondary | ICD-10-CM | POA: Diagnosis not present

## 2020-11-07 DIAGNOSIS — N1832 Chronic kidney disease, stage 3b: Secondary | ICD-10-CM | POA: Diagnosis present

## 2020-11-07 DIAGNOSIS — I96 Gangrene, not elsewhere classified: Secondary | ICD-10-CM | POA: Diagnosis not present

## 2020-11-07 DIAGNOSIS — Z794 Long term (current) use of insulin: Secondary | ICD-10-CM | POA: Diagnosis not present

## 2020-11-07 DIAGNOSIS — Z66 Do not resuscitate: Secondary | ICD-10-CM | POA: Diagnosis not present

## 2020-11-07 DIAGNOSIS — R0600 Dyspnea, unspecified: Secondary | ICD-10-CM | POA: Diagnosis not present

## 2020-11-07 DIAGNOSIS — I739 Peripheral vascular disease, unspecified: Secondary | ICD-10-CM

## 2020-11-07 DIAGNOSIS — A419 Sepsis, unspecified organism: Secondary | ICD-10-CM | POA: Diagnosis not present

## 2020-11-07 DIAGNOSIS — E1122 Type 2 diabetes mellitus with diabetic chronic kidney disease: Secondary | ICD-10-CM | POA: Diagnosis present

## 2020-11-07 DIAGNOSIS — I517 Cardiomegaly: Secondary | ICD-10-CM | POA: Diagnosis not present

## 2020-11-07 DIAGNOSIS — G4089 Other seizures: Secondary | ICD-10-CM | POA: Diagnosis not present

## 2020-11-07 DIAGNOSIS — G40409 Other generalized epilepsy and epileptic syndromes, not intractable, without status epilepticus: Secondary | ICD-10-CM | POA: Diagnosis not present

## 2020-11-07 DIAGNOSIS — E782 Mixed hyperlipidemia: Secondary | ICD-10-CM | POA: Diagnosis not present

## 2020-11-07 DIAGNOSIS — L0390 Cellulitis, unspecified: Secondary | ICD-10-CM

## 2020-11-07 DIAGNOSIS — J9601 Acute respiratory failure with hypoxia: Secondary | ICD-10-CM | POA: Diagnosis not present

## 2020-11-07 DIAGNOSIS — G8918 Other acute postprocedural pain: Secondary | ICD-10-CM | POA: Diagnosis not present

## 2020-11-07 DIAGNOSIS — J9811 Atelectasis: Secondary | ICD-10-CM | POA: Diagnosis not present

## 2020-11-07 DIAGNOSIS — K567 Ileus, unspecified: Secondary | ICD-10-CM | POA: Diagnosis not present

## 2020-11-07 DIAGNOSIS — I447 Left bundle-branch block, unspecified: Secondary | ICD-10-CM | POA: Diagnosis not present

## 2020-11-07 DIAGNOSIS — N179 Acute kidney failure, unspecified: Secondary | ICD-10-CM | POA: Diagnosis not present

## 2020-11-07 DIAGNOSIS — N3090 Cystitis, unspecified without hematuria: Secondary | ICD-10-CM | POA: Diagnosis not present

## 2020-11-07 DIAGNOSIS — R410 Disorientation, unspecified: Secondary | ICD-10-CM | POA: Diagnosis not present

## 2020-11-07 DIAGNOSIS — E872 Acidosis: Secondary | ICD-10-CM | POA: Diagnosis not present

## 2020-11-07 DIAGNOSIS — I469 Cardiac arrest, cause unspecified: Secondary | ICD-10-CM | POA: Diagnosis not present

## 2020-11-07 DIAGNOSIS — R4020 Unspecified coma: Secondary | ICD-10-CM | POA: Diagnosis not present

## 2020-11-07 DIAGNOSIS — E875 Hyperkalemia: Secondary | ICD-10-CM | POA: Diagnosis not present

## 2020-11-07 DIAGNOSIS — G931 Anoxic brain damage, not elsewhere classified: Secondary | ICD-10-CM | POA: Diagnosis not present

## 2020-11-07 DIAGNOSIS — J90 Pleural effusion, not elsewhere classified: Secondary | ICD-10-CM | POA: Diagnosis not present

## 2020-11-07 DIAGNOSIS — E1142 Type 2 diabetes mellitus with diabetic polyneuropathy: Secondary | ICD-10-CM | POA: Diagnosis present

## 2020-11-07 DIAGNOSIS — E11621 Type 2 diabetes mellitus with foot ulcer: Secondary | ICD-10-CM | POA: Diagnosis present

## 2020-11-07 DIAGNOSIS — Z978 Presence of other specified devices: Secondary | ICD-10-CM | POA: Diagnosis not present

## 2020-11-07 DIAGNOSIS — R9082 White matter disease, unspecified: Secondary | ICD-10-CM | POA: Diagnosis not present

## 2020-11-07 DIAGNOSIS — I129 Hypertensive chronic kidney disease with stage 1 through stage 4 chronic kidney disease, or unspecified chronic kidney disease: Secondary | ICD-10-CM | POA: Diagnosis present

## 2020-11-07 DIAGNOSIS — M868X6 Other osteomyelitis, lower leg: Secondary | ICD-10-CM | POA: Diagnosis not present

## 2020-11-07 DIAGNOSIS — E11649 Type 2 diabetes mellitus with hypoglycemia without coma: Secondary | ICD-10-CM | POA: Diagnosis present

## 2020-11-07 DIAGNOSIS — Z4682 Encounter for fitting and adjustment of non-vascular catheter: Secondary | ICD-10-CM | POA: Diagnosis not present

## 2020-11-07 DIAGNOSIS — J9602 Acute respiratory failure with hypercapnia: Secondary | ICD-10-CM | POA: Diagnosis not present

## 2020-11-07 DIAGNOSIS — K7200 Acute and subacute hepatic failure without coma: Secondary | ICD-10-CM | POA: Diagnosis not present

## 2020-11-07 DIAGNOSIS — R569 Unspecified convulsions: Secondary | ICD-10-CM | POA: Diagnosis not present

## 2020-11-07 DIAGNOSIS — J690 Pneumonitis due to inhalation of food and vomit: Secondary | ICD-10-CM | POA: Diagnosis not present

## 2020-11-07 DIAGNOSIS — N2889 Other specified disorders of kidney and ureter: Secondary | ICD-10-CM | POA: Diagnosis not present

## 2020-11-07 DIAGNOSIS — G319 Degenerative disease of nervous system, unspecified: Secondary | ICD-10-CM | POA: Diagnosis not present

## 2020-11-07 DIAGNOSIS — E11628 Type 2 diabetes mellitus with other skin complications: Secondary | ICD-10-CM

## 2020-11-07 DIAGNOSIS — Z20822 Contact with and (suspected) exposure to covid-19: Secondary | ICD-10-CM | POA: Diagnosis not present

## 2020-11-07 DIAGNOSIS — L089 Local infection of the skin and subcutaneous tissue, unspecified: Secondary | ICD-10-CM

## 2020-11-07 DIAGNOSIS — L03116 Cellulitis of left lower limb: Secondary | ICD-10-CM | POA: Diagnosis present

## 2020-11-07 DIAGNOSIS — R34 Anuria and oliguria: Secondary | ICD-10-CM | POA: Diagnosis not present

## 2020-11-07 DIAGNOSIS — E1169 Type 2 diabetes mellitus with other specified complication: Secondary | ICD-10-CM | POA: Diagnosis present

## 2020-11-07 DIAGNOSIS — G928 Other toxic encephalopathy: Secondary | ICD-10-CM | POA: Diagnosis not present

## 2020-11-07 DIAGNOSIS — R579 Shock, unspecified: Secondary | ICD-10-CM | POA: Diagnosis not present

## 2020-11-07 DIAGNOSIS — E1165 Type 2 diabetes mellitus with hyperglycemia: Secondary | ICD-10-CM | POA: Diagnosis not present

## 2020-11-07 DIAGNOSIS — Z6837 Body mass index (BMI) 37.0-37.9, adult: Secondary | ICD-10-CM | POA: Diagnosis not present

## 2020-11-07 DIAGNOSIS — I70262 Atherosclerosis of native arteries of extremities with gangrene, left leg: Secondary | ICD-10-CM | POA: Diagnosis present

## 2020-11-07 DIAGNOSIS — Z01818 Encounter for other preprocedural examination: Secondary | ICD-10-CM | POA: Diagnosis not present

## 2020-11-07 DIAGNOSIS — R0602 Shortness of breath: Secondary | ICD-10-CM | POA: Diagnosis not present

## 2020-11-07 DIAGNOSIS — E1152 Type 2 diabetes mellitus with diabetic peripheral angiopathy with gangrene: Secondary | ICD-10-CM | POA: Diagnosis not present

## 2020-11-07 DIAGNOSIS — Z9911 Dependence on respirator [ventilator] status: Secondary | ICD-10-CM | POA: Diagnosis not present

## 2020-11-07 DIAGNOSIS — R4182 Altered mental status, unspecified: Secondary | ICD-10-CM | POA: Diagnosis not present

## 2020-11-07 DIAGNOSIS — R0902 Hypoxemia: Secondary | ICD-10-CM | POA: Diagnosis not present

## 2020-11-07 LAB — GLUCOSE, CAPILLARY
Glucose-Capillary: 101 mg/dL — ABNORMAL HIGH (ref 70–99)
Glucose-Capillary: 47 mg/dL — ABNORMAL LOW (ref 70–99)
Glucose-Capillary: 100 mg/dL — ABNORMAL HIGH (ref 70–99)
Glucose-Capillary: 42 mg/dL — CL (ref 70–99)

## 2020-11-07 LAB — CBG MONITORING, ED
Glucose-Capillary: 147 mg/dL — ABNORMAL HIGH (ref 70–99)
Glucose-Capillary: 53 mg/dL — ABNORMAL LOW (ref 70–99)
Glucose-Capillary: 77 mg/dL (ref 70–99)

## 2020-11-07 LAB — CBC
HCT: 39.1 % (ref 39.0–52.0)
Hemoglobin: 12.4 g/dL — ABNORMAL LOW (ref 13.0–17.0)
MCH: 30 pg (ref 26.0–34.0)
MCHC: 31.7 g/dL (ref 30.0–36.0)
MCV: 94.4 fL (ref 80.0–100.0)
Platelets: 271 10*3/uL (ref 150–400)
RBC: 4.14 MIL/uL — ABNORMAL LOW (ref 4.22–5.81)
RDW: 14.1 % (ref 11.5–15.5)
WBC: 16.8 10*3/uL — ABNORMAL HIGH (ref 4.0–10.5)
nRBC: 0 % (ref 0.0–0.2)

## 2020-11-07 LAB — BASIC METABOLIC PANEL
Anion gap: 10 (ref 5–15)
BUN: 51 mg/dL — ABNORMAL HIGH (ref 8–23)
CO2: 23 mmol/L (ref 22–32)
Calcium: 8.6 mg/dL — ABNORMAL LOW (ref 8.9–10.3)
Chloride: 101 mmol/L (ref 98–111)
Creatinine, Ser: 2.19 mg/dL — ABNORMAL HIGH (ref 0.61–1.24)
GFR, Estimated: 30 mL/min — ABNORMAL LOW (ref >60)
Glucose, Bld: 129 mg/dL — ABNORMAL HIGH (ref 70–99)
Potassium: 4 mmol/L (ref 3.5–5.1)
Sodium: 134 mmol/L — ABNORMAL LOW (ref 135–145)

## 2020-11-07 LAB — MRSA NEXT GEN BY PCR, NASAL: MRSA by PCR Next Gen: NOT DETECTED

## 2020-11-07 LAB — LACTIC ACID, PLASMA: Lactic Acid, Venous: 1.3 mmol/L (ref 0.5–1.9)

## 2020-11-07 MED ORDER — OXYCODONE HCL 5 MG PO TABS
5.0000 mg | ORAL_TABLET | Freq: Four times a day (QID) | ORAL | Status: DC | PRN
  Administered 2020-11-07 – 2020-11-08 (×3): 5 mg via ORAL
  Filled 2020-11-07 (×3): qty 1

## 2020-11-07 MED ORDER — ALLOPURINOL 100 MG PO TABS
100.0000 mg | ORAL_TABLET | Freq: Every day | ORAL | Status: DC
  Administered 2020-11-07 – 2020-11-14 (×8): 100 mg via ORAL
  Filled 2020-11-07 (×8): qty 1

## 2020-11-07 MED ORDER — VANCOMYCIN HCL 1500 MG/300ML IV SOLN
1500.0000 mg | INTRAVENOUS | Status: DC
  Administered 2020-11-09 – 2020-11-11 (×2): 1500 mg via INTRAVENOUS
  Filled 2020-11-07 (×2): qty 300

## 2020-11-07 MED ORDER — APIXABAN 5 MG PO TABS
5.0000 mg | ORAL_TABLET | Freq: Two times a day (BID) | ORAL | Status: DC
  Administered 2020-11-07: 5 mg via ORAL
  Filled 2020-11-07: qty 1

## 2020-11-07 MED ORDER — WHITE PETROLATUM EX OINT
TOPICAL_OINTMENT | CUTANEOUS | Status: DC | PRN
  Administered 2020-11-24: 1 via TOPICAL
  Filled 2020-11-07: qty 28
  Filled 2020-11-07: qty 5
  Filled 2020-11-07: qty 28.35

## 2020-11-07 MED ORDER — PRAVASTATIN SODIUM 40 MG PO TABS
40.0000 mg | ORAL_TABLET | Freq: Every day | ORAL | Status: DC
  Administered 2020-11-07 – 2020-11-13 (×6): 40 mg via ORAL
  Filled 2020-11-07 (×8): qty 1

## 2020-11-07 MED ORDER — ACETAMINOPHEN 500 MG PO TABS: 1000.0000 mg | ORAL_TABLET | Freq: Four times a day (QID) | ORAL | Status: DC | PRN

## 2020-11-07 MED ORDER — ACETAMINOPHEN 325 MG PO TABS
650.0000 mg | ORAL_TABLET | Freq: Four times a day (QID) | ORAL | Status: DC | PRN
  Administered 2020-11-08 (×2): 650 mg via ORAL
  Filled 2020-11-07 (×3): qty 2

## 2020-11-07 MED ORDER — CEFTRIAXONE SODIUM 2 G IJ SOLR
2.0000 g | INTRAMUSCULAR | Status: AC
  Administered 2020-11-07 – 2020-11-13 (×6): 2 g via INTRAVENOUS
  Filled 2020-11-07 (×6): qty 20

## 2020-11-07 MED ORDER — METRONIDAZOLE 500 MG PO TABS
500.0000 mg | ORAL_TABLET | Freq: Two times a day (BID) | ORAL | Status: AC
  Administered 2020-11-07 – 2020-11-13 (×14): 500 mg via ORAL
  Filled 2020-11-07 (×14): qty 1

## 2020-11-07 MED ORDER — ONDANSETRON HCL 4 MG/2ML IJ SOLN
4.0000 mg | Freq: Four times a day (QID) | INTRAMUSCULAR | Status: DC | PRN
  Filled 2020-11-07: qty 2

## 2020-11-07 MED ORDER — VANCOMYCIN HCL 2000 MG/400ML IV SOLN
2000.0000 mg | Freq: Once | INTRAVENOUS | Status: AC
  Administered 2020-11-07: 2000 mg via INTRAVENOUS
  Filled 2020-11-07: qty 400

## 2020-11-07 MED ORDER — VITAMIN D 25 MCG (1000 UNIT) PO TABS
5000.0000 [IU] | ORAL_TABLET | Freq: Every day | ORAL | Status: DC
  Administered 2020-11-07 – 2020-11-14 (×8): 5000 [IU] via ORAL
  Filled 2020-11-07 (×7): qty 5

## 2020-11-07 MED ORDER — SODIUM CHLORIDE 0.45 % IV SOLN: INTRAVENOUS | Status: AC

## 2020-11-07 MED ORDER — AMLODIPINE BESYLATE 5 MG PO TABS
5.0000 mg | ORAL_TABLET | Freq: Every day | ORAL | Status: DC
  Administered 2020-11-07 – 2020-11-14 (×8): 5 mg via ORAL
  Filled 2020-11-07 (×8): qty 1

## 2020-11-07 MED ORDER — PIPERACILLIN-TAZOBACTAM 3.375 G IVPB 30 MIN
3.3750 g | Freq: Once | INTRAVENOUS | Status: AC
  Administered 2020-11-07: 3.375 g via INTRAVENOUS
  Filled 2020-11-07: qty 50

## 2020-11-07 MED ORDER — METOPROLOL TARTRATE 25 MG PO TABS
25.0000 mg | ORAL_TABLET | Freq: Two times a day (BID) | ORAL | Status: DC
  Administered 2020-11-07 – 2020-11-14 (×15): 25 mg via ORAL
  Filled 2020-11-07 (×16): qty 1

## 2020-11-07 MED ORDER — ONDANSETRON HCL 4 MG PO TABS: 4.0000 mg | ORAL_TABLET | Freq: Four times a day (QID) | ORAL | Status: DC | PRN

## 2020-11-07 MED ORDER — ACETAMINOPHEN 650 MG RE SUPP: 650.0000 mg | Freq: Four times a day (QID) | RECTAL | Status: DC | PRN

## 2020-11-07 MED ORDER — INSULIN PUMP
Freq: Three times a day (TID) | SUBCUTANEOUS | Status: DC
  Administered 2020-11-12: 6 via SUBCUTANEOUS
  Administered 2020-11-12: 35 via SUBCUTANEOUS
  Filled 2020-11-07: qty 1

## 2020-11-07 NOTE — Progress Notes (Signed)
Inpatient Diabetes Program Recommendations  AACE/ADA: New Consensus Statement on Inpatient Glycemic Control (2015)  Target Ranges:  Prepandial:   less than 140 mg/dL      Peak postprandial:   less than 180 mg/dL (1-2 hours)      Critically ill patients:  140 - 180 mg/dL   Lab Results  Component Value Date   GLUCAP 53 (L) 11/07/2020   HGBA1C 8.9 05/12/2010    Review of Glycemic Control  Diabetes history: type 2? Outpatient Diabetes medications: Medtronic 630G insulin pump and Dexcom CGM Current orders for Inpatient glycemic control: insulin pump  Inpatient Diabetes Program Recommendations:    Endocrinologist: [facility] Dr. Grandville Silos   Insulin pump settings: (10/04/20) 00:00 3.85 U/hr.  0900  4.25 U/hr.  15:00  4.45 U/hr.  Total: 100.2 units basal  Bolus ICR: 3.0  ISF:8  Target glucose: 100-150 mg/dl  Last A1C was 8.1%.   Will continue to monitor blood sugars while in the hospital.  Harvel Ricks RN BSN CDE Diabetes Coordinator Pager: [phone]  8am-5pm

## 2020-11-07 NOTE — ED Notes (Addendum)
Pt agrees to insulin pump therapy and signed contract at bedside along with chart

## 2020-11-07 NOTE — ED Notes (Addendum)
Per Pharmacy - It is scheduled for 10 and 22 but pt hadn't had it since yesterday am so Alcario Drought wanted it given right away. I've put it in for now but if pt still refuses after this explanation, please change the time to 10a. You may adjust a single dose "DUE" time directly from the [facility] without pharmacy intervention. Just click on the time it is due and then select "due" and move it to the time that you need it. Please call if you need assistance doing this. Thanks!

## 2020-11-07 NOTE — ED Notes (Signed)
Pt states that his is uncomfortable in bed. Pt assisted into recliner

## 2020-11-07 NOTE — ED Notes (Signed)
Dr. Gardner at bedside 

## 2020-11-07 NOTE — Progress Notes (Signed)
VASCULAR LAB    Left lower extremity arterial duplex has been performed.  See CV proc for preliminary results.   Everlynn Sagun, RVT 11/07/2020, 2:46 PM

## 2020-11-07 NOTE — Progress Notes (Signed)
Pt arrived to unit via stretcher accompanied by NT and son. Pt alert/oriented in no apparent distress. Situated/orientated to room /equipments,patient guide/menu provided with instructions.Pt/son verbalized understanding of instructions. Hospital valuables policy has been discussed to pt/son. No complaints.voiced. Hospital bed in lowest position with 3 side rails up, call bell/room phone within reach and all wheels locked.

## 2020-11-07 NOTE — Progress Notes (Addendum)
Pharmacy Antibiotic Note  Patrick Shelton is a 80 y.o. male admitted on 11/05/2020 after referral from wound care clinic because the diabetic foot wound has progressed despite care >> concern for osteomyelitis and bacteremia.  Pharmacy has been consulted for vancomycin dosing.  Plan: Vancomycin '2000mg'$  x1 then '1500mg'$  IV Q48H. Goal AUC 400-550.  Expected AUC 500.  SCr 2.35.   Height: '5\' 10"'$  (177.8 cm) Weight: 117.9 kg (259 lb 14.8 oz) IBW/kg (Calculated) : 73  Temp (24hrs), Avg:99.9 F (37.7 C), Min:99.9 F (37.7 C), Max:99.9 F (37.7 C)  Recent Labs  Lab 10/22/2020 2031 11/05/2020 2032  WBC 20.0*  --   CREATININE 2.35*  --   LATICACIDVEN  --  1.4    Estimated Creatinine Clearance: 32.3 mL/min (A) (by C-G formula based on SCr of 2.35 mg/dL (H)).    Allergies  Allergen Reactions   Prednisone Anaphylaxis and Shortness Of Breath   Dulaglutide Nausea Only   Atorvastatin    Lovastatin     Other reaction(s): OTHER REACTION     Thank you for allowing pharmacy to be a part of this patient's care.  Wynona Neat, PharmD, BCPS  11/07/2020 1:41 AM

## 2020-11-07 NOTE — Progress Notes (Signed)
[facility] PROGRESS NOTE   Patrick Shelton [id number] DOB: 08/16/1940 DOA: 10/21/2020  PCP: Lorne Skeens, MD  Brief History/Interval Summary: 80 y.o. male with medical history significant of DM2, HTN, HLD, PAD.  Pt is s/p BKA of RLE previously. 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  Not improved despite wound care and multiple ABx courses as outpt. Pt sent to ED due to concern for osteomyelitis and possible bacteremia.  Apparently had subjective fevers at home.  X-ray of the foot is unchanged from films done few weeks ago.  Patient was started on IV antibiotics.  Hospitalize for further management.  Consultants: Vascular surgery has been consulted  Procedures: None yet  Antibiotics: Anti-infectives (From admission, onward)    Start     Dose/Rate Route Frequency Ordered Stop   11/09/20 0600  vancomycin (VANCOREADY) IVPB 1500 mg/300 mL        1,500 mg 150 mL/hr over 120 Minutes Intravenous Every 48 hours 11/07/20 0147     11/07/20 1000  cefTRIAXone (ROCEPHIN) 2 g in sodium chloride 0.9 % 100 mL IVPB        2 g 200 mL/hr over 30 Minutes Intravenous Every 24 hours 11/07/20 0136 11/14/20 0959   11/07/20 1000  metroNIDAZOLE (FLAGYL) tablet 500 mg        500 mg Oral Every 12 hours 11/07/20 0136 11/14/20 0959   11/07/20 0200  vancomycin (VANCOREADY) IVPB 2000 mg/400 mL        2,000 mg 200 mL/hr over 120 Minutes Intravenous  Once 11/07/20 0147 11/07/20 0636   11/07/20 0045  piperacillin-tazobactam (ZOSYN) IVPB 3.375 g        3.375 g 100 mL/hr over 30 Minutes Intravenous  Once 11/07/20 0039 11/07/20 0204       Subjective/Interval History: Patient complains of some pain in the left foot.  Denies any nausea vomiting.  No chest pain or shortness of breath.  States that his glucose level is low this morning and requesting a snack.     Assessment/Plan:  Gangrenous left fourth  toe in the setting of diabetes/peripheral artery disease Patient followed by vascular surgeon, Dr. Scot Dock on a regular basis.  Last seen in July.  Arteriogram was offered but patient declined.  Vascular surgery has been consulted to assist with management.  Patient remains on vancomycin ceftriaxone and metronidazole.  Follow-up on cultures.  WBC was noted to be elevated.  Lactic acid level normal.  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  Acute failure likely secondary to infection and perhaps a component of hypovolemia.  Holding ARB and HCTZ.  Consider gentle hydration as well.  Monitor urine output.  Avoid nephrotoxic agents.  Diabetes mellitus, insulin-dependent Patient is on an insulin pump.  He has signed the insulin pump contract.  Low glucose levels noted this morning.  We will request diabetes coordinator to assist with education.  Normocytic anemia Likely due to chronic disease.  No evidence of overt bleeding.  Essential hypertension Continue amlodipine.  Permanent atrial fibrillation Not noted to be on any rate limiting drugs.  On Eliquis which has been placed on hold in case patient needs to undergo surgical procedure.  Morbid obesity Estimated body mass index is 37.29 kg/m as calculated from the following:   Height as of this encounter: '5\' 10"'$  (1.778 m).   Weight as of this encounter: 117.9 kg.  DVT Prophylaxis: Eliquis currently on hold till patient has been seen by vascular surgery.  He did get a dose earlier this morning. Code Status: Full code Family Communication: Discussed with patient Disposition Plan: To be determined  Status is: Inpatient  Remains inpatient appropriate because:Ongoing diagnostic testing needed not appropriate for outpatient work up, IV treatments appropriate due to intensity of illness or inability to take PO, and Inpatient level of care appropriate due to severity of  illness  Dispo: The patient is from: Home              Anticipated d/c is to: Home              Patient currently is not medically stable to d/c.   Difficult to place patient No       Medications: Scheduled:  allopurinol  100 mg Oral Daily   amLODipine  5 mg Oral Daily   cholecalciferol  5,000 Units Oral Daily   insulin pump   Subcutaneous TID WC, HS, 0200   metroNIDAZOLE  500 mg Oral Q12H   pravastatin  40 mg Oral q1800   Continuous:  cefTRIAXone (ROCEPHIN)  IV     [START ON 11/09/2020] vancomycin     [id number] **OR** acetaminophen, ondansetron **OR** ondansetron (ZOFRAN) IV, oxyCODONE, white petrolatum   Objective:  Vital Signs  Vitals:   11/07/20 0900 11/07/20 0915 11/07/20 0930 11/07/20 0945  BP: (!) 143/88 (!) 151/66 (!) 169/68 (!) 146/68  Pulse: 84 86 67 89  Resp: 18 (!) 23 (!) 26 (!) 29  Temp:      TempSrc:      SpO2: 93% 91% 94% 92%  Weight:      Height:       No intake or output data in the 24 hours ending 11/07/20 [place] Weights   10/14/2020 1954  Weight: 117.9 kg    General appearance: Awake alert.  In no distress Resp: Clear to auscultation bilaterally.  Normal effort Cardio: S1-S2 is normal regular.  No S3-S4.  No rubs murmurs or bruit GI: Abdomen is soft.  Nontender nondistended.  Bowel sounds are present normal.  No masses organomegaly Extremities: Status post right BKA.  Gangrenous appearing left fourth toe noted.  Poorly palpable pulses Neurologic: Alert and oriented x3.  No focal neurological deficits.    Lab Results:  Data Reviewed: I have personally reviewed following labs and imaging studies  CBC: Recent Labs  Lab 10/29/2020 2031 11/07/20 0414  WBC 20.0* 16.8*  NEUTROABS 14.8*  --   HGB 12.3* 12.4*  HCT 37.0* 39.1  MCV 92.5 94.4  PLT 280 [id number]    Basic Metabolic Panel: Recent Labs  Lab 11/03/2020 2031 11/07/20 0414  NA 136 134*  K 4.0 4.0  CL 97* 101  CO2 26 23  GLUCOSE 117* 129*  BUN 51* 51*  CREATININE 2.35*  2.19*  CALCIUM 8.9 8.6*    GFR: Estimated Creatinine Clearance: 34.6 mL/min (A) (by C-G formula based on SCr of 2.19 mg/dL (H)).  Liver Function Tests: Recent Labs  Lab 10/23/2020 2031  AST 31  ALT 31  ALKPHOS 74  BILITOT 0.2*  PROT 7.6  ALBUMIN 2.6*     CBG: Recent Labs  Lab 11/07/20 0358 11/07/20 0742  GLUCAP 147* 53*      Recent Results (from the past 240 hour(s))  Resp Panel by RT-PCR (Flu A&B, Covid) Nasopharyngeal Swab     Status: None   Collection Time: 10/07/2020  7:59 PM   Specimen: Nasopharyngeal  Swab; Nasopharyngeal(NP) swabs in vial transport medium  Result Value Ref Range Status   SARS Coronavirus 2 by RT PCR NEGATIVE NEGATIVE Final    Comment: (NOTE) SARS-CoV-2 target nucleic acids are NOT DETECTED.  The SARS-CoV-2 RNA is generally detectable in upper respiratory specimens during the acute phase of infection. The lowest concentration of SARS-CoV-2 viral copies this assay can detect is 138 copies/mL. A negative result does not preclude SARS-Cov-2 infection and should not be used as the sole basis for treatment or other patient management decisions. A negative result may occur with  improper specimen collection/handling, submission of specimen other than nasopharyngeal swab, presence of viral mutation(s) within the areas targeted by this assay, and inadequate number of viral copies(<138 copies/mL). A negative result must be combined with clinical observations, patient history, and epidemiological information. The expected result is Negative.  Fact Sheet for Patients:  EntrepreneurPulse.com.au  Fact Sheet for Healthcare Providers:  IncredibleEmployment.be  This test is no t yet approved or cleared by the [place] FDA and  has been authorized for detection and/or diagnosis of SARS-CoV-2 by FDA under an Emergency Use Authorization (EUA). This EUA will remain  in effect (meaning this test can be used) for the  duration of the COVID-19 declaration under Section 564(b)(1) of the Act, 21 U.S.C.section 360bbb-3(b)(1), unless the authorization is terminated  or revoked sooner.       Influenza A by PCR NEGATIVE NEGATIVE Final   Influenza B by PCR NEGATIVE NEGATIVE Final    Comment: (NOTE) The Xpert Xpress SARS-CoV-2/FLU/RSV plus assay is intended as an aid in the diagnosis of influenza from Nasopharyngeal swab specimens and should not be used as a sole basis for treatment. Nasal washings and aspirates are unacceptable for Xpert Xpress SARS-CoV-2/FLU/RSV testing.  Fact Sheet for Patients: EntrepreneurPulse.com.au  Fact Sheet for Healthcare Providers: IncredibleEmployment.be  This test is not yet approved or cleared by the [place] FDA and has been authorized for detection and/or diagnosis of SARS-CoV-2 by FDA under an Emergency Use Authorization (EUA). This EUA will remain in effect (meaning this test can be used) for the duration of the COVID-19 declaration under Section 564(b)(1) of the Act, 21 U.S.C. section 360bbb-3(b)(1), unless the authorization is terminated or revoked.  Performed at [facility], [place] [address], [place], [place] [address]   Blood culture (routine x 2)     Status: None (Preliminary result)   Collection Time: 10/20/2020  8:15 PM   Specimen: BLOOD RIGHT HAND  Result Value Ref Range Status   Specimen Description BLOOD RIGHT HAND  Final   Special Requests   Final    BOTTLES DRAWN AEROBIC AND ANAEROBIC Blood Culture results may not be optimal due to an excessive volume of blood received in culture bottles   Culture   Final    NO GROWTH < 12 HOURS Performed at [facility], [place] [address], [place], [place] [address]    Report Status PENDING  Incomplete  Blood culture (routine x 2)     Status: None (Preliminary result)   Collection Time: 10/19/2020  8:32 PM   Specimen: BLOOD LEFT ARM  Result Value Ref Range Status    Specimen Description BLOOD LEFT ARM  Final   Special Requests   Final    BOTTLES DRAWN AEROBIC ONLY Blood Culture adequate volume   Culture   Final    NO GROWTH < 12 HOURS Performed at [facility], [place] [address] [place], [place] [address]  Report Status PENDING  Incomplete  MRSA Next Gen by PCR, Nasal     Status: None   Collection Time: 11/07/20  3:29 AM   Specimen: Nasal Mucosa; Nasal Swab  Result Value Ref Range Status   MRSA by PCR Next Gen NOT DETECTED NOT DETECTED Final    Comment: (NOTE) The GeneXpert MRSA Assay (FDA approved for NASAL specimens only), is one component of a comprehensive MRSA colonization surveillance program. It is not intended to diagnose MRSA infection nor to guide or monitor treatment for MRSA infections. Test performance is not FDA approved in patients less than 40 years old. Performed at [facility], [place] [address], [place], [place] [address]       Radiology Studies: DG Chest 2 View  Addendum Date: 10/15/2020   ADDENDUM REPORT: 10/08/2020 22:46 ADDENDUM: The accession for a concomitant chest radiograph was mistakenly linked to the foot report. Two-view chest x-ray shows shallow lung inflation with bilateral basilar predominant opacities, likely atelectasis or mild pulmonary edema. There is mild cardiomegaly. No pleural effusion. Electronically Signed   By: Ulyses Jarred M.D.   On: 10/18/2020 22:46   Result Date: 10/20/2020 CLINICAL DATA:  Shortness of breath and left toe pain EXAM: CHEST - 2 VIEW; LEFT FOOT - COMPLETE 3+ VIEW COMPARISON:  1722 FINDINGS: Unchanged appearance the distal phalanx of the left fourth toe, with mild fragmentation. Advanced vascular calcification. No acute fracture or dislocation. IMPRESSION: Unchanged appearance of the distal phalanx of the left fourth toe, with mild fragmentation. No progressive osteolysis. Electronically Signed: By: Ulyses Jarred M.D. On: 10/30/2020 21:16   DG Foot Complete Left  Addendum  Date: 10/30/2020   ADDENDUM REPORT: 10/17/2020 22:46 ADDENDUM: The accession for a concomitant chest radiograph was mistakenly linked to the foot report. Two-view chest x-ray shows shallow lung inflation with bilateral basilar predominant opacities, likely atelectasis or mild pulmonary edema. There is mild cardiomegaly. No pleural effusion. Electronically Signed   By: Ulyses Jarred M.D.   On: 10/23/2020 22:46   Result Date: 10/22/2020 CLINICAL DATA:  Shortness of breath and left toe pain EXAM: CHEST - 2 VIEW; LEFT FOOT - COMPLETE 3+ VIEW COMPARISON:  1722 FINDINGS: Unchanged appearance the distal phalanx of the left fourth toe, with mild fragmentation. Advanced vascular calcification. No acute fracture or dislocation. IMPRESSION: Unchanged appearance of the distal phalanx of the left fourth toe, with mild fragmentation. No progressive osteolysis. Electronically Signed: By: Ulyses Jarred M.D. On: 11/01/2020 21:16       LOS: 0 days   [facility] Pager on [email or web address]  11/07/2020, 9:58 AM

## 2020-11-07 NOTE — ED Notes (Signed)
Admitting approached this RN for pt to receive a snack d/t his pump showing his CBG was in the 70's. Glucometer brought to bedside from the unit verified hypoglycemia of 53, graham crackers with peanut butter & apple juice was given.

## 2020-11-07 NOTE — ED Provider Notes (Signed)
[facility] EMERGENCY DEPARTMENT Provider Note   CSN: [id number] Arrival date & time: 10/12/2020  1950     History Chief Complaint  Patient presents with   Wound Infection    Patrick Shelton is a 80 y.o. male.  With significant past medical history including osteoarthritis, type 2 diabetes, PAD, right BKA, who presents emergency department from wound care clinic with concern for osteomyelitis and bacteremia.  Patient states that he has had left fourth toe pain for 3 months now.  He has been continually evaluated at a wound care clinic for the left toe wound.  He was referred today because the wound has progressed despite care and they had concern for osteomyelitis and bacteremia.  On further questioning he says he feels fatigued for the past 3 days.  Additionally endorses subjective fevers, shortness of breath, diarrhea.  Denies chest pain, abdominal pain, nausea/vomiting.   HPI     Past Medical History:  Diagnosis Date   Arthritis Sept. 2015   Rh. Neck and Upper Back   Arthritis Sept. 2015   Gout-Right Hand  Left knee   Cancer ([place])    8 wks Radiation   Chronic kidney disease    Diabetes mellitus    ED (erectile dysfunction)    Hx of agent Orange exposure    while [place] in [place] during that conflict   Hyperlipidemia    Hypertension    PVD (peripheral vascular disease) ([place])    PVT (paroxysmal ventricular tachycardia) ([place])    S/P BKA (below knee amputation) ([place])     Patient Active Problem List   Diagnosis Date Noted   Osteoarthritis 04/26/2018   PVD (posterior vitreous detachment), both eyes 08/19/2017   Diabetic polyneuropathy ([place]) 06/23/2016   Hyperuricemia 06/23/2016   Insulin pump status 06/23/2016   Mixed dyslipidemia 06/23/2016   Vitamin D deficiency 06/23/2016   Dyspnea on exertion 04/03/2016   Coronary artery calcification 02/22/2016   History of agent Orange exposure 09/09/2015   Below knee amputation status, right 09/09/2015    Pain of left lower extremity 06/18/2015   Hypertension, essential 02/19/2015   Morbid obesity ([place]) 02/19/2015   Chronic cough 01/26/2015   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 associated with type 2 diabetes mellitus ([place]) 07/19/2014   Right shoulder pain 03/21/2014   Aftercare following surgery of the circulatory system, NEC 11/23/2013   PAD (peripheral artery disease) ([place]) 03/31/2013   History of pulmonary embolus (PE) 01/29/2013   Hyperlipidemia 01/29/2013   Swelling of limb-left leg 09/23/2012   Critical lower limb ischemia ([place]) 09/23/2012   After-cataract 09/01/2012   Proliferative diabetic retinopathy(362.02) 07/23/2011   Status post intraocular lens implant 07/23/2011   Diabetes mellitus ([place]) 05/12/2010   DIABETIC  RETINOPATHY 05/12/2010   Type 2 diabetes mellitus with hyperglycemia, with long-term current use of insulin ([place]) 05/12/2010   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 05/12/2010   POLYNEUROPATHY IN DIABETES 05/12/2010   UNSPECIFIED PERIPHERAL VASCULAR DISEASE 05/12/2010   CKD (chronic kidney disease) stage 3, GFR 30-59 ml/min (HCC) 05/12/2010   ERECTILE DYSFUNCTION, 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/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   Status post below knee amputation ([place]) 05/12/2010    Past Surgical History:  Procedure Laterality Date   AMPUTATION Right 2009   BKA   PROSTATE SURGERY     8 wks radiation       Family History  Problem Relation Age of Onset   Dementia Mother    Diabetes Father        Amputation-  Bilateral leg   Peripheral vascular disease Father    Social History   Tobacco Use   Smoking status: Former    Packs/day: 0.25    Years: 10.00    Pack years: 2.50    Types: Cigarettes    Quit date: 03/09/1977    Years since quitting: 43.6   Smokeless tobacco: Never  Vaping Use   Vaping Use: Never used  Substance Use Topics   Alcohol use: No    Alcohol/week: 0.0 standard drinks   Drug use: No    Home Medications Prior to Admission medications    Medication Sig Start Date End Date Taking? Authorizing Provider  allopurinol (ZYLOPRIM) 100 MG tablet  03/15/18   [provider]  amLODipine (NORVASC) 5 MG tablet TAKE 1 TABLET(5 MG) BY MOUTH DAILY 06/13/20   Croitoru, Dani Gobble, MD  apixaban (ELIQUIS) 5 MG TABS tablet Take 1 tablet (5 mg total) by mouth 2 (two) times daily. 05/18/19   Croitoru, Mihai, MD  B Complex Vitamins (B COMPLEX PO) Take 1 tablet by mouth daily.     [provider]  Cholecalciferol (VITAMIN D-3) 5000 UNITS TABS Take 5,000 Units by mouth daily.     [provider]  CONTOUR NEXT TEST test strip TEST BLOOD SUGAR LEVELS QID 02/11/18   [provider]  ezetimibe (ZETIA) 10 MG tablet Take 5 mg by mouth daily.    [provider]  guaiFENesin (MUCINEX) 600 MG 12 hr tablet Take 600 mg by mouth 2 (two) times daily.    [provider]  hydrochlorothiazide 25 MG tablet Take 25 mg by mouth daily.    [provider]  HYDROcodone-acetaminophen (NORCO/VICODIN) 5-325 MG tablet Take 1 tablet every 8 hours as needed for diabetic neuropathy pain 04/04/18   [provider]  losartan (COZAAR) 100 MG tablet  03/15/18   [provider]  NOVOLOG 100 UNIT/ML injection Inject into the skin continuous. Via insulin pump 08/19/15   [provider]  pravastatin (PRAVACHOL) 40 MG tablet Take 40 mg by mouth daily.    [provider]  VOLTAREN 1 % GEL  11/05/17   [provider]    Allergies    Prednisone, Dulaglutide, Atorvastatin, and Lovastatin  Review of Systems   Review of Systems  Constitutional:  Positive for fatigue and fever.  HENT: Negative.    Respiratory:  Positive for shortness of breath. Negative for chest tightness.   Cardiovascular:  Negative for chest pain and palpitations.  Gastrointestinal:  Positive for diarrhea. Negative for nausea and vomiting.  Genitourinary: Negative.   Musculoskeletal: Negative.   Skin:  Positive for wound.   Neurological:  Negative for dizziness, syncope and light-headedness.  All other systems reviewed and are negative.  Physical Exam Updated Vital Signs BP (!) 174/101   Pulse (!) 120   Temp 99.9 F (37.7 C) (Oral)   Resp (!) 22   Ht '5\' 10"'$  (1.778 m)   Wt 117.9 kg   SpO2 94%   BMI 37.29 kg/m   Physical Exam Constitutional:      General: He is not in acute distress.    Appearance: Normal appearance.  HENT:     Head: Normocephalic and atraumatic.  Eyes:     Extraocular Movements: Extraocular movements intact.     Conjunctiva/sclera: Conjunctivae normal.     Pupils: Pupils are equal, round, and reactive to light.  Cardiovascular:     Rate and Rhythm: Normal rate and regular rhythm.     Pulses:  Normal pulses.     Heart sounds: Normal heart sounds. No murmur heard. Pulmonary:     Effort: Pulmonary effort is normal.     Breath sounds: Rales present.  Chest:     Chest wall: No tenderness.  Abdominal:     General: Bowel sounds are normal.     Palpations: Abdomen is soft.  Musculoskeletal:        General: Normal range of motion.     Left foot: Swelling, tenderness and bony tenderness present.     Comments: Gangrenous appearing left 4th toe with redness and warmth to the left foot      Right Lower Extremity: Right leg is amputated below knee.  Skin:    General: Skin is warm and dry.     Capillary Refill: Capillary refill takes 2 to 3 seconds.  Neurological:     General: No focal deficit present.     Mental Status: He is alert and oriented to person, place, and time.  Psychiatric:        Mood and Affect: Mood normal.        Behavior: Behavior normal.    ED Results / Procedures / Treatments   Labs (all labs ordered are listed, but only abnormal results are displayed) Labs Reviewed  CBC WITH DIFFERENTIAL/PLATELET - Abnormal; Notable for the following components:      Result Value   WBC 20.0 (*)    RBC 4.00 (*)    Hemoglobin 12.3 (*)    HCT 37.0 (*)    Neutro Abs 14.8  (*)    Monocytes Absolute 1.3 (*)    Abs Immature Granulocytes 0.13 (*)    All other components within normal limits  COMPREHENSIVE METABOLIC PANEL - Abnormal; Notable for the following components:   Chloride 97 (*)    Glucose, Bld 117 (*)    BUN 51 (*)    Creatinine, Ser 2.35 (*)    Albumin 2.6 (*)    Total Bilirubin 0.2 (*)    GFR, Estimated 27 (*)    All other components within normal limits  CBC - Abnormal; Notable for the following components:   WBC 16.8 (*)    RBC 4.14 (*)    Hemoglobin 12.4 (*)    All other components within normal limits  CBG MONITORING, ED - Abnormal; Notable for the following components:   Glucose-Capillary 147 (*)    All other components within normal limits  CULTURE, BLOOD (ROUTINE X 2)  RESP PANEL BY RT-PCR (FLU A&B, COVID) ARPGX2  CULTURE, BLOOD (ROUTINE X 2)  MRSA NEXT GEN BY PCR, NASAL  LACTIC ACID, PLASMA  LACTIC ACID, PLASMA  BASIC METABOLIC PANEL    EKG None  Radiology DG Chest 2 View  Addendum Date: 11/03/2020   ADDENDUM REPORT: 10/22/2020 22:46 ADDENDUM: The accession for a concomitant chest radiograph was mistakenly linked to the foot report. Two-view chest x-ray shows shallow lung inflation with bilateral basilar predominant opacities, likely atelectasis or mild pulmonary edema. There is mild cardiomegaly. No pleural effusion. Electronically Signed   By: Ulyses Jarred M.D.   On: 11/01/2020 22:46   Result Date: 10/09/2020 CLINICAL DATA:  Shortness of breath and left toe pain EXAM: CHEST - 2 VIEW; LEFT FOOT - COMPLETE 3+ VIEW COMPARISON:  1722 FINDINGS: Unchanged appearance the distal phalanx of the left fourth toe, with mild fragmentation. Advanced vascular calcification. No acute fracture or dislocation. IMPRESSION: Unchanged appearance of the distal phalanx of the left fourth toe, with mild fragmentation. No progressive  osteolysis. Electronically Signed: By: Ulyses Jarred M.D. On: 10/12/2020 21:16   DG Foot Complete Left  Addendum  Date: 10/20/2020   ADDENDUM REPORT: 11/04/2020 22:46 ADDENDUM: The accession for a concomitant chest radiograph was mistakenly linked to the foot report. Two-view chest x-ray shows shallow lung inflation with bilateral basilar predominant opacities, likely atelectasis or mild pulmonary edema. There is mild cardiomegaly. No pleural effusion. Electronically Signed   By: Ulyses Jarred M.D.   On: 10/19/2020 22:46   Result Date: 10/25/2020 CLINICAL DATA:  Shortness of breath and left toe pain EXAM: CHEST - 2 VIEW; LEFT FOOT - COMPLETE 3+ VIEW COMPARISON:  1722 FINDINGS: Unchanged appearance the distal phalanx of the left fourth toe, with mild fragmentation. Advanced vascular calcification. No acute fracture or dislocation. IMPRESSION: Unchanged appearance of the distal phalanx of the left fourth toe, with mild fragmentation. No progressive osteolysis. Electronically Signed: By: Ulyses Jarred M.D. On: 11/05/2020 21:16    Procedures Procedures   Medications Ordered in ED Medications  acetaminophen (TYLENOL) tablet 650 mg (has no administration in time range)    Or  acetaminophen (TYLENOL) suppository 650 mg (has no administration in time range)  ondansetron (ZOFRAN) tablet 4 mg (has no administration in time range)    Or  ondansetron (ZOFRAN) injection 4 mg (has no administration in time range)  cefTRIAXone (ROCEPHIN) 2 g in sodium chloride 0.9 % 100 mL IVPB (has no administration in time range)  metroNIDAZOLE (FLAGYL) tablet 500 mg (has no administration in time range)  insulin pump (has no administration in time range)  vancomycin (VANCOREADY) IVPB 2000 mg/400 mL (has no administration in time range)  vancomycin (VANCOREADY) IVPB 1500 mg/300 mL (has no administration in time range)  piperacillin-tazobactam (ZOSYN) IVPB 3.375 g (0 g Intravenous Stopped 11/07/20 0204)    ED Course  I have reviewed the triage vital signs and the nursing notes.  Pertinent labs & imaging results that were available  during my care of the patient were reviewed by me and considered in my medical decision making (see chart for details).  Spoke with Dr. Alcario Drought - hospitalist - will see at bedside.  MDM Rules/Calculators/A&P 80 year old gentleman with a significant past medical history presented emergency department for left fourth toe wound.  In history, exam and work-up cannot rule out underlying osteomyelitis.  Previous imaging was concerning for osteomyelitis and has been unchanged per x-ray read from today's visit.  Exam is consistent with a gangrenous toe.  I have low suspicion for necrotizing fasciitis, abscess, DVT. The left foot is red, hot, mildly swollen.  There is concern for cellulitis.  WBC 20.  Lactic 1.4. Subjective fever at home and low-grade temp of 99.9 on presentation today.  Concern for sepsis so blood cultures x2 drawn.  For the above reasons we started him on Zosyn empirically. He has been hemodynamically stable throughout his emergency department stay.  Hospitalist was consulted and patient admitted.  He understands the plan of care at this time.  Final Clinical Impression(s) / ED Diagnoses Final diagnoses:  Cellulitis of left foot    Rx / DC Orders ED Discharge Orders     None        Mickie Hillier, PA-C 11/07/20 1938    Fatima Blank, MD 11/25/2020 [phone]

## 2020-11-07 NOTE — Consult Note (Addendum)
Hospital Consult    Reason for Consult:  Gangrenous 4th toe, left foot Requesting Physician:  Dr. Maryland Pink MRN #:  [id number]  History of Present Illness: This is a 80 y.o. male with pertinent medical history including Diabetes Mellitus Type II, HTN, HLD, and PAD who presents at recommendation from wound care center with concerns of osteomyelitis of left 4th toe. He is well known to [facility] for both his venous and arterial disease. He has history of Right BKA due to non healing wound despite attempts at revascularization. He specifically has been followed by Dr. 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 This wound initially appeared in April of this year. He has been receiving care for it at the wound care center for a pretibial wound as well as his 4th toe. He has been offered arteriography on several follow up visits by Dr. Scot Dock, but has been reluctant to proceed with concerns of damage to his kidneys. He explains that he and his wife noticed redness in his left leg initially yesterday and he has had increased pain since yesterday. Prior to that he has had pains in his leg and foot but he has had similar pains for a long time secondary to neuropathy. He does not ambulate much with his right prosthesis so unable to appreciate any claudication symptoms. He notes subjective fever and chills.   On presentation to ED his WBC is elevated at 20, lactic acid 1.4. Low grade fever of 99.9. Blood cultures pending. He has been initiated on Vancomycin. Foot xray not showing any osteolysis   Past Medical History:  Diagnosis Date   Arthritis Sept. 2015   Rh. Neck and Upper Back   Arthritis Sept. 2015   Gout-Right Hand  Left knee   Cancer ([place])    8 wks Radiation   Chronic kidney disease    Diabetes mellitus    ED (erectile dysfunction)    Hx of agent Orange exposure    while [place] in [place] during that conflict   Hyperlipidemia    Hypertension    PVD (peripheral  vascular disease) ([place])    PVT (paroxysmal ventricular tachycardia) ([place])    S/P BKA (below knee amputation) ([place])     Past Surgical History:  Procedure Laterality Date   AMPUTATION Right 2009   BKA   PROSTATE SURGERY     8 wks radiation    Allergies  Allergen Reactions   Prednisone Anaphylaxis and Shortness Of Breath   Dulaglutide Nausea Only   Atorvastatin    Lovastatin     Other reaction(s): OTHER REACTION    Prior to Admission medications   Medication Sig Start Date End Date Taking? Authorizing Provider  acetaminophen (TYLENOL) 500 MG tablet Take 1,000 mg by mouth every 6 (six) hours as needed for moderate pain.   Yes [provider]  allopurinol (ZYLOPRIM) 100 MG tablet Take 100 mg by mouth daily. 03/15/18  Yes [provider]  amLODipine (NORVASC) 5 MG tablet TAKE 1 TABLET(5 MG) BY MOUTH DAILY Patient taking differently: Take 5 mg by mouth daily. 06/13/20  Yes Croitoru, Mihai, MD  apixaban (ELIQUIS) 5 MG TABS tablet Take 1 tablet (5 mg total) by mouth 2 (two) times daily. 05/18/19  Yes Croitoru, Mihai, MD  B Complex Vitamins (B COMPLEX PO) Take 1 tablet by mouth daily.    Yes [provider]  Cholecalciferol (VITAMIN D-3) 5000 UNITS TABS Take 5,000 Units by mouth daily.  Yes [provider]  colchicine 0.6 MG tablet Take 0.6 mg by mouth daily as needed (gout flare).   Yes [provider]  Glucagon 3 MG/DOSE POWD Place 3 mg into the nose as needed (low blood sugar).   Yes [provider]  hydrochlorothiazide 25 MG tablet Take 25 mg by mouth daily.   Yes [provider]  losartan (COZAAR) 100 MG tablet Take 100 mg by mouth daily. 03/15/18  Yes [provider]  NOVOLOG 100 UNIT/ML injection Inject into the skin continuous. Via insulin pump 08/19/15  Yes [provider]  OVER THE COUNTER MEDICATION Take 1 tablet by mouth daily. Fiberwell gummy   Yes [provider]  pravastatin (PRAVACHOL) 40 MG  tablet Take 40 mg by mouth daily.   Yes [provider]  VOLTAREN 1 % GEL Apply 2-4 g topically 4 (four) times daily as needed (pain). 11/05/17  Yes [provider]  CONTOUR NEXT TEST test strip TEST BLOOD SUGAR LEVELS QID 02/11/18   [provider]    Social History   Socioeconomic History   Marital status: Married    Spouse name: Not on file   Number of children: Not on file   Years of education: Not on file   Highest education level: Not on file  Occupational History   Not on file  Tobacco Use   Smoking status: Former    Packs/day: 0.25    Years: 10.00    Pack years: 2.50    Types: Cigarettes    Quit date: 03/09/1977    Years since quitting: 43.6   Smokeless tobacco: Never  Vaping Use   Vaping Use: Never used  Substance and Sexual Activity   Alcohol use: No    Alcohol/week: 0.0 standard drinks   Drug use: No   Sexual activity: Not on file  Other Topics Concern   Not on file  Social History Narrative   Not on file   Social Determinants of Health   Financial Resource Strain: Not on file  Food Insecurity: Not on file  Transportation Needs: Not on file  Physical Activity: Not on file  Stress: Not on file  Social Connections: Not on file  Intimate Partner Violence: Not on file     Family History  Problem Relation Age of Onset   Dementia Mother    Diabetes Father        Amputation- Bilateral leg   Peripheral vascular disease Father     ROS: Otherwise negative unless mentioned in HPI  Physical Examination  Vitals:   11/07/20 0945 11/07/20 1000  BP: (!) 146/68 (!) 152/62  Pulse: 89 (!) 56  Resp: (!) 29 (!) 31  Temp:    SpO2: 92% 92%   Body mass index is 37.29 kg/m.  General:  WDWN in NAD Gait: Not observed, in chair HENT: WNL, normocephalic Pulmonary: normal non-labored breathing Cardiac: irregular Abdomen: obese,  soft, NT/ND, no masses Vascular Exam/Pulses: 2+ left femoral pulse, Doppler left popliteal and monophasic DP  doppler signal Extremities: with ischemic changes of left 4th toe and plantar left foot, with Gangrene of left 4th toe , with cellulitis of left foot and leg;        Musculoskeletal: no muscle wasting or atrophy  Neurologic: A&O X 3;  No focal weakness or paresthesias are detected; speech is fluent/normal Psychiatric:  The pt has Normal affect. Lymph:  Unremarkable  CBC    Component Value Date/Time   WBC 16.8 (H) 11/07/2020 0414  RBC 4.14 (L) 11/07/2020 0414   HGB 12.4 (L) 11/07/2020 0414   HCT 39.1 11/07/2020 0414   PLT 271 11/07/2020 0414   MCV 94.4 11/07/2020 0414   MCH 30.0 11/07/2020 0414   MCHC 31.7 11/07/2020 0414   RDW 14.1 11/07/2020 0414   LYMPHSABS 3.6 10/27/2020 2031   MONOABS 1.3 (H) 10/15/2020 2031   EOSABS 0.1 10/27/2020 2031   BASOSABS 0.0 11/04/2020 2031    BMET    Component Value Date/Time   NA 134 (L) 11/07/2020 0414   K 4.0 11/07/2020 0414   CL 101 11/07/2020 0414   CO2 23 11/07/2020 0414   GLUCOSE 129 (H) 11/07/2020 0414   BUN 51 (H) 11/07/2020 0414   CREATININE 2.19 (H) 11/07/2020 0414   CALCIUM 8.6 (L) 11/07/2020 0414   GFRNONAA 30 (L) 11/07/2020 0414   GFRAA 34 (L) 04/03/2014 1901    COAGS: Lab Results  Component Value Date   INR 2.0 05/24/2019   INR 2.1 05/10/2019   INR 2.4 04/05/2019     Statin:  Yes.   Beta Blocker:  No Aspirin:  No. ACEI:  No. ARB:  Yes.   CCB use:  Yes Other antiplatelets/anticoagulants:  Yes.   Eliquis   ASSESSMENT/PLAN: This is a 80 y.o. male with progression to wet gangrene left 4th toe and cellulitis. He has known calcific atherosclerosis with infrainguinal arterial occlusive disease. He has been offered arteriography in the past which he has been reluctant to undergo due to risk of worsening renal function. This again was discussed with him as an option for limb salvage. They again are refusing an arteriogram. I discussed with patient and his wife that based on blood flow he would not heal a toe  amputation likely even with improved perfusion. They understand that he will need a left below knee amputation. They also understand that this also may not heal and may require more proximal amputation. They understand and would like to proceed with left below knee amputation. Continue IV antibiotics. Medicine is admitting for further management. He is on Eliquis for atrial fibrillation which will need to be held for surgery. The on call vascular surgeon, Dr. Stanford Breed, will see the patient later today and discuss timing of surgery   Karoline Caldwell PA-C [facility] [phone] 11/07/2020  10:17 AM  VASCULAR STAFF ADDENDUM: I have independently interviewed and examined the patient. I agree with the above.  Patient not interested in angiogram given small risk of progression of chronic kidney disease. Hold Eliquis. Transition to heparin. Duplex shows SFA stenosis, but patent popliteal artery. Patient would like me to review findings with Dr. Scot Dock. Tentative plan L BKA early next week.  Yevonne Aline. Stanford Breed, MD [facility] Phone Number: [phone] 11/07/2020 3:57 PM

## 2020-11-07 NOTE — H&P (Signed)
History and Physical    Patrick Shelton [id number] DOB: [birth year] DOA: 10/26/2020  PCP: Lorne Skeens, MD  Patient coming from: Home  I have personally briefly reviewed patient's old medical records in [place]  Chief Complaint: Toe infection  HPI: Patrick Shelton is a 80 y.o. male with medical history significant of DM2, HTN, HLD, PAD.  Pt is s/p BKA of RLE previously.  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  Symptoms persistent, worsening.  Not improved despite wound care and multiple ABx courses as outpt.  Pt sent to ED due to concern for osteomyelitis and possible bacteremia.  Has subjective fevers, SOB, diarrhea.  Denies CP, abd pain, N/V.   ED Course: X ray of foot: unchanged from earlier this month but continues to be concerning for osteomyelitis of the distal 4th toe.  Pt started on zosyn in ED.  WBC 20k.  Tm 99.9  HR 72-120 (A.Fib)  Creat 2.3 up from 1.5 in May.   Review of Systems: As per HPI, otherwise all review of systems negative.  Past Medical History:  Diagnosis Date   Arthritis Sept. 2015   Rh. Neck and Upper Back   Arthritis Sept. 2015   Gout-Right Hand  Left knee   Cancer ([place])    8 wks Radiation   Chronic kidney disease    Diabetes mellitus    ED (erectile dysfunction)    Hx of agent Orange exposure    [place] that conflict   Hyperlipidemia    Hypertension    PVD (peripheral vascular disease) ([place])    PVT (paroxysmal ventricular tachycardia) ([place])    S/P BKA (below knee amputation) ([place])     Past Surgical History:  Procedure Laterality Date   AMPUTATION Right 2009   BKA   PROSTATE SURGERY     8 wks radiation     reports that he quit smoking about 43 years ago. His smoking use included cigarettes. He has a 2.50 pack-year smoking history. He has never used smokeless tobacco. He reports that he does not  drink alcohol and does not use drugs.  Allergies  Allergen Reactions   Prednisone Anaphylaxis and Shortness Of Breath   Dulaglutide Nausea Only   Atorvastatin    Lovastatin     Other reaction(s): OTHER REACTION    Family History  Problem Relation Age of Onset   Dementia Mother    Diabetes Father        Amputation- Bilateral leg   Peripheral vascular disease Father      Prior to Admission medications   Medication Sig Start Date End Date Taking? Authorizing Provider  acetaminophen (TYLENOL) 500 MG tablet Take 1,000 mg by mouth every 6 (six) hours as needed for moderate pain.   Yes [provider]  allopurinol (ZYLOPRIM) 100 MG tablet Take 100 mg by mouth daily. 03/15/18  Yes [provider]  amLODipine (NORVASC) 10 MG tablet Take 10 mg by mouth daily.   Yes [provider]  amLODipine (NORVASC) 5 MG tablet TAKE 1 TABLET(5 MG) BY MOUTH DAILY Patient taking differently: Take 5 mg by mouth daily. 06/13/20  Yes Croitoru, Mihai, MD  apixaban (ELIQUIS) 5 MG TABS tablet Take 1 tablet (5 mg total) by mouth 2 (two) times daily. 05/18/19  Yes Croitoru, Mihai, MD  B Complex Vitamins (B COMPLEX PO) Take 1 tablet by mouth daily.    Yes  [provider]  Cholecalciferol (VITAMIN D-3) 5000 UNITS TABS Take 5,000 Units by mouth daily.    Yes [provider]  colchicine 0.6 MG tablet Take 0.6 mg by mouth daily as needed (gout flare).   Yes [provider]  Glucagon 3 MG/DOSE POWD Place 3 mg into the nose as needed (low blood sugar).   Yes [provider]  hydrochlorothiazide 25 MG tablet Take 25 mg by mouth daily.   Yes [provider]  losartan (COZAAR) 100 MG tablet Take 100 mg by mouth daily. 03/15/18  Yes [provider]  NOVOLOG 100 UNIT/ML injection Inject into the skin continuous. Via insulin pump 08/19/15  Yes [provider]  OVER THE COUNTER MEDICATION Take 1 tablet by mouth daily. Fiberwell gummy   Yes  [provider]  pravastatin (PRAVACHOL) 40 MG tablet Take 40 mg by mouth daily.   Yes [provider]  VOLTAREN 1 % GEL Apply 2-4 g topically 4 (four) times daily as needed (pain). 11/05/17  Yes [provider]  CONTOUR NEXT TEST test strip TEST BLOOD SUGAR LEVELS QID 02/11/18   [provider]    Physical Exam: Vitals:   10/13/2020 2340 11/07/20 0015 11/07/20 0045 11/07/20 0145  BP: (!) 115/57 (!) 174/101 139/80 (!) 146/61  Pulse: 96 (!) 120 (!) 112 72  Resp: 16 (!) 22 (!) 26 (!) 26  Temp:      TempSrc:      SpO2: 93% 94% 91% 90%  Weight:      Height:        Constitutional: NAD, calm, comfortable Eyes: PERRL, lids and conjunctivae normal ENMT: Mucous membranes are moist. Posterior pharynx clear of any exudate or lesions.Normal dentition.  Neck: normal, supple, no masses, no thyromegaly Respiratory: clear to auscultation bilaterally, no wheezing, no crackles. Normal respiratory effort. No accessory muscle use.  Cardiovascular: IRR, IRR Abdomen: no tenderness, no masses palpated. No hepatosplenomegaly. Bowel sounds positive.  Musculoskeletal: no clubbing / cyanosis. No joint deformity upper and lower extremities. Good ROM, no contractures. Normal muscle tone.  Skin: L 4th toe with ulcer and is gangrenous.  Erythema and edema to top of foot Neurologic: CN 2-12 grossly intact. Sensation intact, DTR normal. Strength 5/5 in all 4.  Psychiatric: Normal judgment and insight. Alert and oriented x 3. Normal mood.    Labs on Admission: I have personally reviewed following labs and imaging studies  CBC: Recent Labs  Lab 10/31/2020 2031  WBC 20.0*  NEUTROABS 14.8*  HGB 12.3*  HCT 37.0*  MCV 92.5  PLT 123456   Basic Metabolic Panel: Recent Labs  Lab 10/19/2020 2031  NA 136  K 4.0  CL 97*  CO2 26  GLUCOSE 117*  BUN 51*  CREATININE 2.35*  CALCIUM 8.9   GFR: Estimated Creatinine Clearance: 32.3 mL/min (A) (by C-G formula based on SCr of 2.35 mg/dL  (H)). Liver Function Tests: Recent Labs  Lab 10/17/2020 2031  AST 31  ALT 31  ALKPHOS 74  BILITOT 0.2*  PROT 7.6  ALBUMIN 2.6*   No results for input(s): LIPASE, AMYLASE in the last 168 hours. No results for input(s): AMMONIA in the last 168 hours. Coagulation Profile: No results for input(s): INR, PROTIME in the last 168 hours. Cardiac Enzymes: No results for input(s): CKTOTAL, CKMB, CKMBINDEX, TROPONINI in the last 168 hours. BNP (last 3 results) No results for input(s): PROBNP in the last 8760 hours. HbA1C: No results for input(s): HGBA1C in the last 72 hours. CBG:  No results for input(s): GLUCAP in the last 168 hours. Lipid Profile: No results for input(s): CHOL, HDL, LDLCALC, TRIG, CHOLHDL, LDLDIRECT in the last 72 hours. Thyroid Function Tests: No results for input(s): TSH, T4TOTAL, FREET4, T3FREE, THYROIDAB in the last 72 hours. Anemia Panel: No results for input(s): VITAMINB12, FOLATE, FERRITIN, TIBC, IRON, RETICCTPCT in the last 72 hours. Urine analysis:    Component Value Date/Time   COLORURINE YELLOW 04/20/2008 2111   APPEARANCEUR CLOUDY (A) 04/20/2008 2111   LABSPEC 1.015 04/20/2008 2111   PHURINE 7.0 04/20/2008 2111   GLUCOSEU NEGATIVE 04/20/2008 2111   HGBUR SMALL (A) 04/20/2008 2111   BILIRUBINUR NEGATIVE 04/20/2008 2111   KETONESUR NEGATIVE 04/20/2008 2111   PROTEINUR >300 (A) 04/20/2008 2111   UROBILINOGEN 1.0 04/20/2008 2111   NITRITE NEGATIVE 04/20/2008 2111   LEUKOCYTESUR NEGATIVE 04/20/2008 2111    Radiological Exams on Admission: DG Chest 2 View  Addendum Date: 11/04/2020   ADDENDUM REPORT: 10/09/2020 22:46 ADDENDUM: The accession for a concomitant chest radiograph was mistakenly linked to the foot report. Two-view chest x-ray shows shallow lung inflation with bilateral basilar predominant opacities, likely atelectasis or mild pulmonary edema. There is mild cardiomegaly. No pleural effusion. Electronically Signed   By: Ulyses Jarred M.D.   On:  10/28/2020 22:46   Result Date: 10/14/2020 CLINICAL DATA:  Shortness of breath and left toe pain EXAM: CHEST - 2 VIEW; LEFT FOOT - COMPLETE 3+ VIEW COMPARISON:  1722 FINDINGS: Unchanged appearance the distal phalanx of the left fourth toe, with mild fragmentation. Advanced vascular calcification. No acute fracture or dislocation. IMPRESSION: Unchanged appearance of the distal phalanx of the left fourth toe, with mild fragmentation. No progressive osteolysis. Electronically Signed: By: Ulyses Jarred M.D. On: 10/17/2020 21:16   DG Foot Complete Left  Addendum Date: 10/08/2020   ADDENDUM REPORT: 10/31/2020 22:46 ADDENDUM: The accession for a concomitant chest radiograph was mistakenly linked to the foot report. Two-view chest x-ray shows shallow lung inflation with bilateral basilar predominant opacities, likely atelectasis or mild pulmonary edema. There is mild cardiomegaly. No pleural effusion. Electronically Signed   By: Ulyses Jarred M.D.   On: 10/16/2020 22:46   Result Date: 10/28/2020 CLINICAL DATA:  Shortness of breath and left toe pain EXAM: CHEST - 2 VIEW; LEFT FOOT - COMPLETE 3+ VIEW COMPARISON:  1722 FINDINGS: Unchanged appearance the distal phalanx of the left fourth toe, with mild fragmentation. Advanced vascular calcification. No acute fracture or dislocation. IMPRESSION: Unchanged appearance of the distal phalanx of the left fourth toe, with mild fragmentation. No progressive osteolysis. Electronically Signed: By: Ulyses Jarred M.D. On: 10/25/2020 21:16    EKG: Independently reviewed.  Assessment/Plan Principal Problem:   Diabetic infection of left foot (HCC) Active Problems:   Diabetes mellitus (HCC)   CKD (chronic kidney disease) stage 3, GFR 30-59 ml/min (HCC)   Hx of right BKA (HCC)   AKI (acute kidney injury) ([place])    Diabetic infection of left foot - LE wound pathway Empiric rocephin, flagyl, vanc MRSA Risk = recent wound care Check MRSA PCR nares Consult vasc surgery in  AM Sounds like pt already expecting to have L BKA from prior discussions with Dr. Scot Dock AKI on CKD 3 - ? Pre-renal, related to infection vs progression of diabetic nephropathy BP running on high side already in ED so dont feel that large IVF boluses indicated at this point. Hold ARB + HCTZ to "gently hydrate" patient Daily BMP Intake and output If not rapidly improving, warrants nephrology consult.  DM - Cont insulin pump Insulin pump order set CBG checks: AC/HS and 0200 HTN - Cont norvasc Hold nephrotoxic ARB + HCTZ A.Fib - Cont eliquis Tele monitor for HR Not on any chronic rate control meds that I can see, but HR currently 72  DVT prophylaxis: Eliquis Code Status: Full Family Communication: No family in room Disposition Plan: Home after cleared by Vasc surgery Consults called: None Admission status: Admit to inpatient  Severity of Illness: The appropriate patient status for this patient is INPATIENT. Inpatient status is judged to be reasonable and necessary in order to provide the required intensity of service to ensure the patient's safety. The patient's presenting symptoms, physical exam findings, and initial radiographic and laboratory data in the context of their chronic comorbidities is felt to place them at high risk for further clinical deterioration. Furthermore, it is not anticipated that the patient will be medically stable for discharge from the hospital within 2 midnights of admission. The following factors support the patient status of inpatient.   IP status for limb threatening infection of LLE foot. Also failed outpt ABx treatment. Also has AKI.   * I certify that at the point of admission it is my clinical judgment that the patient will require inpatient hospital care spanning beyond 2 midnights from the point of admission due to high intensity of service, high risk for further deterioration and high frequency of surveillance required.*   Terrian Ridlon M.  DO [facility]  How to contact the [facility] Attending or Consulting provider [place] or covering provider during after hours [place], for this patient?  Check the care team in [facility] and look for a) attending/consulting [facility] provider listed and b) the [facility] team listed Log into [email or web address]  Amion Physician Scheduling and messaging for groups and whole hospitals  On call and physician scheduling software for group practices, residents, hospitalists and other medical providers for call, clinic, rotation and shift schedules. On[employer] is a hospital-wide system for scheduling doctors and paging doctors on call. EasyPlot is for scientific plotting and data analysis.  [email or web address]  and use [place]'s universal password to access. If you do not have the password, please contact the hospital operator.  Locate the [facility] provider you are looking for under [facility] and page to a number that you can be directly reached. If you still have difficulty reaching the provider, please page the [facility] (Director on Call) for the Hospitalists listed on amion for assistance.  11/07/2020, 3:24 AM

## 2020-11-07 DEATH — deceased

## 2020-11-08 DIAGNOSIS — E1152 Type 2 diabetes mellitus with diabetic peripheral angiopathy with gangrene: Secondary | ICD-10-CM | POA: Diagnosis not present

## 2020-11-08 DIAGNOSIS — N183 Chronic kidney disease, stage 3 unspecified: Secondary | ICD-10-CM | POA: Diagnosis not present

## 2020-11-08 DIAGNOSIS — N179 Acute kidney failure, unspecified: Secondary | ICD-10-CM | POA: Diagnosis not present

## 2020-11-08 DIAGNOSIS — E11628 Type 2 diabetes mellitus with other skin complications: Secondary | ICD-10-CM | POA: Diagnosis not present

## 2020-11-08 LAB — CBC
HCT: 35.2 % — ABNORMAL LOW (ref 39.0–52.0)
Hemoglobin: 11.2 g/dL — ABNORMAL LOW (ref 13.0–17.0)
MCH: 29.6 pg (ref 26.0–34.0)
MCHC: 31.8 g/dL (ref 30.0–36.0)
MCV: 93.1 fL (ref 80.0–100.0)
Platelets: 304 10*3/uL (ref 150–400)
RBC: 3.78 MIL/uL — ABNORMAL LOW (ref 4.22–5.81)
RDW: 13.9 % (ref 11.5–15.5)
WBC: 21 10*3/uL — ABNORMAL HIGH (ref 4.0–10.5)
nRBC: 0 % (ref 0.0–0.2)

## 2020-11-08 LAB — BASIC METABOLIC PANEL
Anion gap: 7 (ref 5–15)
BUN: 46 mg/dL — ABNORMAL HIGH (ref 8–23)
CO2: 29 mmol/L (ref 22–32)
Calcium: 8.8 mg/dL — ABNORMAL LOW (ref 8.9–10.3)
Chloride: 99 mmol/L (ref 98–111)
Creatinine, Ser: 2.37 mg/dL — ABNORMAL HIGH (ref 0.61–1.24)
GFR, Estimated: 27 mL/min — ABNORMAL LOW (ref >60)
Glucose, Bld: 75 mg/dL (ref 70–99)
Potassium: 4.1 mmol/L (ref 3.5–5.1)
Sodium: 135 mmol/L (ref 135–145)

## 2020-11-08 LAB — GLUCOSE, CAPILLARY
Glucose-Capillary: 80 mg/dL (ref 70–99)
Glucose-Capillary: 186 mg/dL — ABNORMAL HIGH (ref 70–99)
Glucose-Capillary: 228 mg/dL — ABNORMAL HIGH (ref 70–99)
Glucose-Capillary: 196 mg/dL — ABNORMAL HIGH (ref 70–99)
Glucose-Capillary: 78 mg/dL (ref 70–99)
Glucose-Capillary: 128 mg/dL — ABNORMAL HIGH (ref 70–99)

## 2020-11-08 MED ORDER — SODIUM CHLORIDE 0.9 % IV BOLUS
500.0000 mL | Freq: Once | INTRAVENOUS | Status: AC
  Administered 2020-11-08: 500 mL via INTRAVENOUS

## 2020-11-08 MED ORDER — ENOXAPARIN SODIUM 30 MG/0.3ML IJ SOSY: 30.0000 mg | PREFILLED_SYRINGE | INTRAMUSCULAR | Status: DC

## 2020-11-08 MED ORDER — OXYCODONE HCL 5 MG PO TABS
5.0000 mg | ORAL_TABLET | Freq: Four times a day (QID) | ORAL | Status: DC | PRN
Start: 2020-11-08 — End: 2020-11-15
  Administered 2020-11-08 – 2020-11-14 (×6): 5 mg via ORAL
  Filled 2020-11-08 (×7): qty 1

## 2020-11-08 MED ORDER — HYDROMORPHONE HCL 1 MG/ML IJ SOLN
0.5000 mg | INTRAMUSCULAR | Status: DC | PRN
  Administered 2020-11-13: 0.5 mg via INTRAVENOUS
  Filled 2020-11-08: qty 1

## 2020-11-08 MED ORDER — GLUCOSE 4 G PO CHEW
CHEWABLE_TABLET | ORAL | Status: AC
  Filled 2020-11-08: qty 1

## 2020-11-08 MED ORDER — PROSOURCE PLUS PO LIQD
30.0000 mL | Freq: Two times a day (BID) | ORAL | Status: DC
  Administered 2020-11-08 – 2020-11-11 (×6): 30 mL via ORAL
  Filled 2020-11-08 (×5): qty 30

## 2020-11-08 MED ORDER — ENOXAPARIN SODIUM 30 MG/0.3ML IJ SOSY
30.0000 mg | PREFILLED_SYRINGE | INTRAMUSCULAR | Status: DC
Start: 2020-11-09 — End: 2020-11-09

## 2020-11-08 NOTE — Progress Notes (Signed)
At 2110 pt CBG 47 and insulin pump stated 65. Pt given 15 grams carbs. At 2132 pt CBG came back at 42. Per order pt put insulin pump in suspension and 15 grams of carbs given. At 2150 pt insulin pump stated 89. At 2206 pt CBG was 100 and 99 on insulin pump. Pump restarted. NP notified of hypoglycemic protocol actions.

## 2020-11-08 NOTE — Plan of Care (Signed)

## 2020-11-08 NOTE — Progress Notes (Signed)
Initial Nutrition Assessment  DOCUMENTATION CODES:   Obesity unspecified  INTERVENTION:   ProSource Plus BID  Snacks TID  Encourage protein at meals and snacks.   Provided a Room Service menu and discussed options.    NUTRITION DIAGNOSIS:   Increased nutrient needs related to wound healing as evidenced by estimated needs.  GOAL:   Patient will meet greater than or equal to 90% of their needs  MONITOR:   PO intake, Supplement acceptance  REASON FOR ASSESSMENT:   Consult Wound healing  ASSESSMENT:   Pt with PMH of DM type II, CKD, HTN, HLD, R BKA (2009) due to non-healing wound after failed revascularization, and PVD and PAD admitted from wound care center after treatment for dry gangrene for the last 3 months now with possible osteomyelitis of L 4th toe.    Vascular following with plans for L BKA early next week.  DM program following for pt with insulin pump and CGM.  Pt requesting snacks due to low blood sugars.   Pt provides hx and reports that he has weighed 260 lb for the last few years.  He feels that his weight has increased due to immobility. He went from walking with maybe a cane to a walker and then just using his wheelchair to get around.  He reports appetite for at least the last year has been less than in past years. Breakfast is at 1 pm and is usually an egg sandwich with fruit, Supper is usually made by his wife and is chicken with potatoes and bread and some veggies. He does not eat beef, pork, or milk/ice cream. He really likes fruit. He usually has a bedtime snack of cheese and crackers to help his blood sugar overnight. He sometimes has lows around 3 am. He feels that his appetite has been less since the infection in his toe started but has not lost any weight.  Pt is agreeable to trying ProSource Plus and feels this is a good option since he does not like milk and his appetite is less right now.    Medications reviewed and include: vitamin D   Insulin pump Labs reviewed: BUN: 46 Cr: 2.37 CBG's: 42-186 No HgbA1C available   NUTRITION - FOCUSED PHYSICAL EXAM:  Flowsheet Row Most Recent Value  Orbital Region No depletion  Upper Arm Region No depletion  Thoracic and Lumbar Region No depletion  Buccal Region No depletion  Temple Region No depletion  Clavicle Bone Region No depletion  Clavicle and Acromion Bone Region No depletion  Scapular Bone Region No depletion  Dorsal Hand Mild depletion  Patellar Region No depletion  Anterior Thigh Region No depletion  Posterior Calf Region No depletion  Edema (RD Assessment) Mild  Hair Reviewed  Eyes Reviewed  Mouth Reviewed  Skin Reviewed  Nails Reviewed       Diet Order:   Diet Order             Diet Carb Modified Fluid consistency: Thin; Room service appropriate? Yes  Diet effective now                   EDUCATION NEEDS:   Education needs have been addressed  Skin:  Skin Assessment: Reviewed RN Assessment (Old R BKA, L foot 4th toe gangrene)  Last BM:  unknown  Height:   Ht Readings from Last 1 Encounters:  11/01/2020 '5\' 10"'$  (1.778 m)    Weight:   Wt Readings from Last 1 Encounters:  10/17/2020 117.9 kg  BMI:  39.97   Estimated Nutritional Needs:   Kcal:  1900-2100  Protein:  115-125 grams  Fluid:  > 1.9 L/day  Lockie Pares., RD, LDN, CNSC See AMiON for contact information

## 2020-11-08 NOTE — Progress Notes (Signed)
[facility] PROGRESS NOTE   Patrick Shelton [id number] DOB: 06/21/1940 DOA: 11/04/2020  PCP: Lorne Skeens, MD  Brief History/Interval Summary: 80 y.o. male with medical history significant of DM2, HTN, HLD, PAD.  Pt is s/p BKA of RLE previously. 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  Not improved despite wound care and multiple ABx courses as outpt. Pt sent to ED due to concern for osteomyelitis and possible bacteremia.  Apparently had subjective fevers at home.  X-ray of the foot is unchanged from films done few weeks ago.  Patient was started on IV antibiotics.  Hospitalize for further management.  Consultants: Vascular surgery has been consulted  Procedures: None yet  Antibiotics: Anti-infectives (From admission, onward)    Start     Dose/Rate Route Frequency Ordered Stop   11/09/20 0600  vancomycin (VANCOREADY) IVPB 1500 mg/300 mL        1,500 mg 150 mL/hr over 120 Minutes Intravenous Every 48 hours 11/07/20 0147     11/07/20 1000  cefTRIAXone (ROCEPHIN) 2 g in sodium chloride 0.9 % 100 mL IVPB        2 g 200 mL/hr over 30 Minutes Intravenous Every 24 hours 11/07/20 0136 11/14/20 0959   11/07/20 1000  metroNIDAZOLE (FLAGYL) tablet 500 mg        500 mg Oral Every 12 hours 11/07/20 0136 11/14/20 0959   11/07/20 0200  vancomycin (VANCOREADY) IVPB 2000 mg/400 mL        2,000 mg 200 mL/hr over 120 Minutes Intravenous  Once 11/07/20 0147 11/07/20 0636   11/07/20 0045  piperacillin-tazobactam (ZOSYN) IVPB 3.375 g        3.375 g 100 mL/hr over 30 Minutes Intravenous  Once 11/07/20 0039 11/07/20 0204       Subjective/Interval History: Overnight hypoglycemic episodes noted.  Overnight fever also noted.  Patient denies any nausea vomiting.  No worsening pain in his left foot.     Assessment/Plan:  Gangrenous left fourth toe in the setting of diabetes/peripheral artery  disease Patient followed by vascular surgeon, Dr. Scot Dock on a regular basis.  Last seen in July.  Arteriogram was offered but patient declined.   Vascular surgery was consulted.  Seen by them yesterday.  Patient not interested in angiogram due to risk of progression of her kidney disease.  Eliquis remains on hold.  Surgery was offered however patient would like to discuss with his primary vascular surgeon, Dr. Scot Dock.  Tentative plan is for left BKA early next week.   Patient noted to be febrile overnight.  Remains on vancomycin, ceftriaxone and metronidazole which will be continued.  WBC remains elevated.  Source of fever is most likely the gangrenous left fourth toe.  If the patient continues to spike fever despite being on antibiotics so that would be problematic and patient may need surgery sooner than later.  Lactic acid level was normal.  Cultures negative so far.  No previous positive cultures noted.  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  Acute failure likely secondary to infection and perhaps a component of hypovolemia.  Holding ARB and HCTZ.   Patient started on IV fluids.  Creatinine noted to be stable for the most part.  Continue to monitor closely.  Avoid nephrotoxic agents.    Diabetes mellitus, insulin-dependent Patient is on an insulin pump.  He has signed the insulin pump  contract.  Hypoglycemic episodes noted overnight.  Patient encouraged to improve his oral intake.  Diabetes coordinator continues to follow.  If blood sugars remain poorly controlled then may need to stop his pump and place him on basal insulin.  He does not want to do that currently.    Normocytic anemia Likely due to chronic disease.  No evidence of overt bleeding.  Essential hypertension Continue amlodipine.  Permanent atrial fibrillation Not noted to be on any rate limiting drugs.  On Eliquis which has been placed on hold in  case patient needs to undergo surgical procedure.  Morbid obesity Estimated body mass index is 37.29 kg/m as calculated from the following:   Height as of this encounter: '5\' 10"'$  (1.778 m).   Weight as of this encounter: 117.9 kg.   DVT Prophylaxis: Eliquis currently on hold.  Initiate DVT prophylaxis since it does not appear the patient will be going for surgery until next week. Code Status: Full code Family Communication: Discussed with patient Disposition Plan: To be determined  Status is: Inpatient  Remains inpatient appropriate because:Ongoing diagnostic testing needed not appropriate for outpatient work up, IV treatments appropriate due to intensity of illness or inability to take PO, and Inpatient level of care appropriate due to severity of illness  Dispo: The patient is from: Home              Anticipated d/c is to: Home              Patient currently is not medically stable to d/c.   Difficult to place patient No       Medications: Scheduled:  allopurinol  100 mg Oral Daily   amLODipine  5 mg Oral Daily   cholecalciferol  5,000 Units Oral Daily   insulin pump   Subcutaneous TID WC, HS, 0200   metoprolol tartrate  25 mg Oral BID   metroNIDAZOLE  500 mg Oral Q12H   pravastatin  40 mg Oral q1800   Continuous:  sodium chloride 100 mL/hr at 11/08/20 0929   cefTRIAXone (ROCEPHIN)  IV 2 g (11/08/20 0930)   [START ON 11/09/2020] vancomycin     [id number] **OR** acetaminophen, ondansetron **OR** ondansetron (ZOFRAN) IV, oxyCODONE, white petrolatum   Objective:  Vital Signs  Vitals:   11/08/20 0431 11/08/20 0503 11/08/20 0548 11/08/20 0815  BP: 120/72 (!) 154/73  (!) 145/84  Pulse: 99 98  90  Resp: '20 20  17  '$ Temp: 99 F (37.2 C) (!) 101 F (38.3 C) 100 F (37.8 C) 97.6 F (36.4 C)  TempSrc: Oral Oral  Oral  SpO2: 94% 91%  91%  Weight:      Height:        Intake/Output Summary (Last 24 hours) at 11/08/2020 1106 Last data filed at 11/08/2020  0434 Gross per 24 hour  Intake 1552.42 ml  Output 750 ml  Net 802.42 ml   Filed Weights   11/03/2020 1954  Weight: 117.9 kg    General appearance: Awake alert.  In no distress Resp: Clear to auscultation bilaterally.  Normal effort Cardio: S1-S2 is normal regular.  No S3-S4.  No rubs murmurs or bruit GI: Abdomen is soft.  Nontender nondistended.  Bowel sounds are present normal.  No masses organomegaly Extremities: Status post right BKA.  Gangrenous appearing left fourth toe with foul smell.  Poorly palpable pulses. Neurologic: Alert and oriented x3.  No focal neurological deficits.      Lab Results:  Data Reviewed: I have personally reviewed  following labs and imaging studies  CBC: Recent Labs  Lab 10/13/2020 2031 11/07/20 0414 11/08/20 0232  WBC 20.0* 16.8* 21.0*  NEUTROABS 14.8*  --   --   HGB 12.3* 12.4* 11.2*  HCT 37.0* 39.1 35.2*  MCV 92.5 94.4 93.1  PLT 280 271 304     Basic Metabolic Panel: Recent Labs  Lab 10/28/2020 2031 11/07/20 0414 11/08/20 0232  NA 136 134* 135  K 4.0 4.0 4.1  CL 97* 101 99  CO2 '26 23 29  '$ GLUCOSE 117* 129* 75  BUN 51* 51* 46*  CREATININE 2.35* 2.19* 2.37*  CALCIUM 8.9 8.6* 8.8*     GFR: Estimated Creatinine Clearance: 32 mL/min (A) (by C-G formula based on SCr of 2.37 mg/dL (H)).  Liver Function Tests: Recent Labs  Lab 10/20/2020 2031  AST 31  ALT 31  ALKPHOS 74  BILITOT 0.2*  PROT 7.6  ALBUMIN 2.6*      CBG: Recent Labs  Lab 11/07/20 2110 11/07/20 2132 11/07/20 2206 11/08/20 0207 11/08/20 0816  GLUCAP 47* 42* 100* 80 186*       Recent Results (from the past 240 hour(s))  Resp Panel by RT-PCR (Flu A&B, Covid) Nasopharyngeal Swab     Status: None   Collection Time: 10/21/2020  7:59 PM   Specimen: Nasopharyngeal Swab; Nasopharyngeal(NP) swabs in vial transport medium  Result Value Ref Range Status   SARS Coronavirus 2 by RT PCR NEGATIVE NEGATIVE Final    Comment: (NOTE) SARS-CoV-2 target nucleic acids  are NOT DETECTED.  The SARS-CoV-2 RNA is generally detectable in upper respiratory specimens during the acute phase of infection. The lowest concentration of SARS-CoV-2 viral copies this assay can detect is 138 copies/mL. A negative result does not preclude SARS-Cov-2 infection and should not be used as the sole basis for treatment or other patient management decisions. A negative result may occur with  improper specimen collection/handling, submission of specimen other than nasopharyngeal swab, presence of viral mutation(s) within the areas targeted by this assay, and inadequate number of viral copies(<138 copies/mL). A negative result must be combined with clinical observations, patient history, and epidemiological information. The expected result is Negative.  Fact Sheet for Patients:  EntrepreneurPulse.com.au  Fact Sheet for Healthcare Providers:  IncredibleEmployment.be  This test is no t yet approved or cleared by the [place] FDA and  has been authorized for detection and/or diagnosis of SARS-CoV-2 by FDA under an Emergency Use Authorization (EUA). This EUA will remain  in effect (meaning this test can be used) for the duration of the COVID-19 declaration under Section 564(b)(1) of the Act, 21 U.S.C.section 360bbb-3(b)(1), unless the authorization is terminated  or revoked sooner.       Influenza A by PCR NEGATIVE NEGATIVE Final   Influenza B by PCR NEGATIVE NEGATIVE Final    Comment: (NOTE) The Xpert Xpress SARS-CoV-2/FLU/RSV plus assay is intended as an aid in the diagnosis of influenza from Nasopharyngeal swab specimens and should not be used as a sole basis for treatment. Nasal washings and aspirates are unacceptable for Xpert Xpress SARS-CoV-2/FLU/RSV testing.  Fact Sheet for Patients: EntrepreneurPulse.com.au  Fact Sheet for Healthcare Providers: IncredibleEmployment.be  This test is  not yet approved or cleared by the [place] FDA and has been authorized for detection and/or diagnosis of SARS-CoV-2 by FDA under an Emergency Use Authorization (EUA). This EUA will remain in effect (meaning this test can be used) for the duration of the COVID-19 declaration under Section 564(b)(1) of the Act, 21 U.S.C.  section 360bbb-3(b)(1), unless the authorization is terminated or revoked.  Performed at [facility], [place] [address] [place], [place] [address]   Blood culture (routine x 2)     Status: None (Preliminary result)   Collection Time: 11/05/2020  8:15 PM   Specimen: BLOOD RIGHT HAND  Result Value Ref Range Status   Specimen Description BLOOD RIGHT HAND  Final   Special Requests   Final    BOTTLES DRAWN AEROBIC AND ANAEROBIC Blood Culture results may not be optimal due to an excessive volume of blood received in culture bottles   Culture   Final    NO GROWTH 2 DAYS Performed at [facility], [place] [address] [place], [place] [address]    Report Status PENDING  Incomplete  Blood culture (routine x 2)     Status: None (Preliminary result)   Collection Time: 10/13/2020  8:32 PM   Specimen: BLOOD LEFT ARM  Result Value Ref Range Status   Specimen Description BLOOD LEFT ARM  Final   Special Requests   Final    BOTTLES DRAWN AEROBIC ONLY Blood Culture adequate volume   Culture   Final    NO GROWTH 2 DAYS Performed at [facility], [place] [address] [place], [place] [address]    Report Status PENDING  Incomplete  MRSA Next Gen by PCR, Nasal     Status: None   Collection Time: 11/07/20  3:29 AM   Specimen: Nasal Mucosa; Nasal Swab  Result Value Ref Range Status   MRSA by PCR Next Gen NOT DETECTED NOT DETECTED Final    Comment: (NOTE) The GeneXpert MRSA Assay (FDA approved for NASAL specimens only), is one component of a comprehensive MRSA colonization surveillance program. It is not intended to diagnose MRSA infection nor to guide or monitor treatment  for MRSA infections. Test performance is not FDA approved in patients less than 41 years old. Performed at [facility], [place] [address] [place], [place] [address]        Radiology Studies: DG Chest 2 View  Addendum Date: 10/08/2020   ADDENDUM REPORT: 10/27/2020 22:46 ADDENDUM: The accession for a concomitant chest radiograph was mistakenly linked to the foot report. Two-view chest x-ray shows shallow lung inflation with bilateral basilar predominant opacities, likely atelectasis or mild pulmonary edema. There is mild cardiomegaly. No pleural effusion. Electronically Signed   By: Ulyses Jarred M.D.   On: 11/05/2020 22:46   Result Date: 10/17/2020 CLINICAL DATA:  Shortness of breath and left toe pain EXAM: CHEST - 2 VIEW; LEFT FOOT - COMPLETE 3+ VIEW COMPARISON:  1722 FINDINGS: Unchanged appearance the distal phalanx of the left fourth toe, with mild fragmentation. Advanced vascular calcification. No acute fracture or dislocation. IMPRESSION: Unchanged appearance of the distal phalanx of the left fourth toe, with mild fragmentation. No progressive osteolysis. Electronically Signed: By: Ulyses Jarred M.D. On: 10/20/2020 21:16   DG Foot Complete Left  Addendum Date: 10/13/2020   ADDENDUM REPORT: 10/08/2020 22:46 ADDENDUM: The accession for a concomitant chest radiograph was mistakenly linked to the foot report. Two-view chest x-ray shows shallow lung inflation with bilateral basilar predominant opacities, likely atelectasis or mild pulmonary edema. There is mild cardiomegaly. No pleural effusion. Electronically Signed   By: Ulyses Jarred M.D.   On: 10/13/2020 22:46   Result Date: 11/05/2020 CLINICAL DATA:  Shortness of breath and left toe pain EXAM: CHEST - 2 VIEW; LEFT FOOT - COMPLETE 3+ VIEW COMPARISON:  1722 FINDINGS:  Unchanged appearance the distal phalanx of the left fourth toe, with mild fragmentation. Advanced vascular calcification. No acute fracture or dislocation. IMPRESSION:  Unchanged appearance of the distal phalanx of the left fourth toe, with mild fragmentation. No progressive osteolysis. Electronically Signed: By: Ulyses Jarred M.D. On: 10/23/2020 21:16   VAS [place] LOWER EXTREMITY ARTERIAL DUPLEX  Result Date: 11/07/2020 LOWER EXTREMITY ARTERIAL DUPLEX STUDY Patient Name:  Patrick Shelton  Date of Exam:   11/07/2020 Medical Rec #: [id number]       Accession #:    [id number] Date of Birth: 1940/08/24       Patient Gender: M Patient Age:   49 years Exam Location:  [facility] Procedure:      VAS [place] LOWER EXTREMITY ARTERIAL DUPLEX Referring Phys: Karoline Caldwell --------------------------------------------------------------------------------  Indications: Ulceration, gangrene, and peripheral artery disease. High Risk Factors: Hypertension, hyperlipidemia, Diabetes, past history of                    smoking.  Vascular Interventions: Right BKA. Current ABI:            n/a Limitations: Body habitus, limited patient cooperation, tachycardia Comparison Study: No prior LEA duplex Performing Technologist: Sharion Dove RVS  Examination Guidelines: A complete evaluation includes B-mode imaging, spectral Doppler, color Doppler, and power Doppler as needed of all accessible portions of each vessel. Bilateral testing is considered an integral part of a complete examination. Limited examinations for reoccurring indications may be performed as noted.    +----------+--------+-----+---------------+-----------+--------+ LEFT      PSV cm/sRatioStenosis       Waveform   Comments +----------+--------+-----+---------------+-----------+--------+ CFA Prox  121                         multiphasic         +----------+--------+-----+---------------+-----------+--------+ SFA Prox  205          50-74% stenosismultiphasic         +----------+--------+-----+---------------+-----------+--------+ SFA Mid   82                          multiphasic          +----------+--------+-----+---------------+-----------+--------+ SFA Distal87                          multiphasic         +----------+--------+-----+---------------+-----------+--------+ POP Prox  47                          monophasic          +----------+--------+-----+---------------+-----------+--------+ POP Distal29                          multiphasic         +----------+--------+-----+---------------+-----------+--------+ PTA Prox  79                          monophasic          +----------+--------+-----+---------------+-----------+--------+ PTA Mid   36                          monophasic          +----------+--------+-----+---------------+-----------+--------+ PTA Distal141          30-49% stenosismonophasic          +----------+--------+-----+---------------+-----------+--------+  Summary: Left: 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 30-49% stenosis noted in the posterior tibial artery.  See table(s) above for measurements and observations. Electronically signed by Jamelle Haring on 11/07/2020 at 3:32:34 PM.    Final        LOS: 1 day   [facility] Pager on [email or web address]  11/08/2020, 11:06 AM

## 2020-11-08 NOTE — Progress Notes (Signed)
Spoke with patient at the bedside. Patient states that he was diagnosed with diabetes about 41 years ago and has been an insulin pump for about 8 years. States that he knows how to control his blood sugars much better with an insulin pump. He does have low blood sugars at night sometimes, but will put pump in suspend mode and treat blood sugar with apple juice.   He has been followed by Dr. Elyse Hsu in the past. States that his last A1C was 8.1%. He knows that he has been having low CBGs here at the hospital. He states that he has not been eating very well, but hoped that his appetite was better today. He states that he can call the doctor's office if he feels that his rates on his pump need to be changed.   He seems very particular with his blood sugar control.  Will continue to monitor blood sugars while in the hospital.  Harvel Ricks RN BSN CDE Diabetes Coordinator Pager: [phone]  8am-5pm

## 2020-11-08 NOTE — Progress Notes (Addendum)
Patrick Shelton, Patrick Shelton ([id number]) Visit Report for 10/07/2020 Arrival Information Details Patient Name: Date of Service: Patrick Shelton 11/05/2020 2:30 PM Medical Record Number: [id number] Patient Account Number: [id number] Date of Birth/Sex: Treating RN: 04/15/1940 (80 y.o. Patrick Shelton Mention Primary Care Patrick Shelton: Patrick Shelton Other Clinician: Donavan Shelton Referring Patrick Shelton: Treating Patrick Shelton/Extender: Patrick Shelton Altheimer, Patrick Shelton in Treatment: 11 Visit Information History Since Last Visit All ordered tests and consults were completed: Yes Patient Arrived: Ambulatory Added or deleted any medications: No Arrival Time: 14:40 Any new allergies or adverse reactions: No Accompanied By: spouse Had a fall or experienced change in No Transfer Assistance: None activities of daily living that may affect Patient Identification Verified: Yes risk of falls: Secondary Verification Process Completed: Yes Signs or symptoms of abuse/neglect since last visito No Patient Requires Transmission-Based Precautions: No Hospitalized since last visit: No Patient Has Alerts: Yes Implantable device outside of the clinic excluding No Patient Alerts: Patient on Blood Thinner cellular tissue based products placed in the center L ABI=[place] L TBI=0.34 since last visit: Pain Present Now: Yes Electronic Signature(s) Signed: 11/08/2020 4:26:36 PM By: Patrick Shelton EMT Entered By: Patrick Shelton on 11/02/2020 14:41:05 -------------------------------------------------------------------------------- Clinic Level of Care Assessment Details Patient Name: Date of Service: Patrick Shelton 10/09/2020 2:30 PM Medical Record Number: [id number] Patient Account Number: [id number] Date of Birth/Sex: Treating RN: 08-12-1940 (80 y.o. Patrick Shelton, Patrick Shelton Primary Care [place] Goeken: Patrick Shelton Other Clinician: Referring Patrick Shelton: Treating Patrick Shelton/Extender: Patrick Shelton Altheimer, Patrick Shelton in Treatment: 11 Clinic Level of Care Assessment Items TOOL 4 Quantity Score X- 1 0 Use when only an EandM is performed on FOLLOW-UP visit ASSESSMENTS - Nursing Assessment / Reassessment X- 1 10 Reassessment of Co-morbidities (includes updates in patient status) X- 1 5 Reassessment of Adherence to Treatment Plan ASSESSMENTS - Wound and Skin A ssessment / Reassessment X - Simple Wound Assessment / Reassessment - one wound 1 5 '[]'$  - 0 Complex Wound Assessment / Reassessment - multiple wounds X- 1 10 Dermatologic / Skin Assessment (not related to wound area) ASSESSMENTS - Focused Assessment X- 1 5 Circumferential Edema Measurements - multi extremities X- 1 10 Nutritional Assessment / Counseling / Intervention '[]'$  - 0 Lower Extremity Assessment (monofilament, tuning fork, pulses) '[]'$  - 0 Peripheral Arterial Disease Assessment (using hand held doppler) ASSESSMENTS - Ostomy and/or Continence Assessment and Care '[]'$  - 0 Incontinence Assessment and Management '[]'$  - 0 Ostomy Care Assessment and Management (repouching, etc.) PROCESS - Coordination of Care '[]'$  - 0 Simple Patient / Family Education for ongoing care X- 1 20 Complex (extensive) Patient / Family Education for ongoing care X- 1 10 Staff obtains Programmer, systems, Records, T Results / Process Orders est '[]'$  - 0 Staff telephones HHA, Nursing Homes / Clarify orders / etc '[]'$  - 0 Routine Transfer to another Facility (non-emergent condition) X- 1 10 Routine Hospital Admission (non-emergent condition) '[]'$  - 0 New Admissions / Biomedical engineer / Ordering NPWT Apligraf, etc. , '[]'$  - 0 Emergency Hospital Admission (emergent condition) '[]'$  - 0 Simple Discharge Coordination X- 1 15 Complex (extensive) Discharge Coordination PROCESS - Special Needs '[]'$  - 0 Pediatric / Minor Patient Management '[]'$  - 0 Isolation Patient Management '[]'$  - 0 Hearing / Language / Visual special needs '[]'$  - 0 Assessment of Community assistance  (transportation, D/C planning, etc.) '[]'$  - 0 Additional assistance / Altered mentation '[]'$  - 0 Support Surface(s) Assessment (bed, cushion, seat, etc.) INTERVENTIONS - Wound Cleansing / Measurement '[]'$  -  0 Simple Wound Cleansing - one wound X- 1 5 Complex Wound Cleansing - multiple wounds X- 1 5 Wound Imaging (photographs - any number of wounds) '[]'$  - 0 Wound Tracing (instead of photographs) '[]'$  - 0 Simple Wound Measurement - one wound X- 1 5 Complex Wound Measurement - multiple wounds INTERVENTIONS - Wound Dressings X - Small Wound Dressing one or multiple wounds 1 10 '[]'$  - 0 Medium Wound Dressing one or multiple wounds '[]'$  - 0 Large Wound Dressing one or multiple wounds '[]'$  - 0 Application of Medications - topical '[]'$  - 0 Application of Medications - injection INTERVENTIONS - Miscellaneous '[]'$  - 0 External ear exam '[]'$  - 0 Specimen Collection (cultures, biopsies, blood, body fluids, etc.) '[]'$  - 0 Specimen(s) / Culture(s) sent or taken to Lab for analysis '[]'$  - 0 Patient Transfer (multiple staff / Civil Service fast streamer / Similar devices) '[]'$  - 0 Simple Staple / Suture removal (25 or less) '[]'$  - 0 Complex Staple / Suture removal (26 or more) '[]'$  - 0 Hypo / Hyperglycemic Management (close monitor of Blood Glucose) '[]'$  - 0 Ankle / Brachial Index (ABI) - do not check if billed separately X- 1 5 Vital Signs Has the patient been seen at the hospital within the last three years: Yes Total Score: 130 Level Of Care: New/Established - Level 4 Electronic Signature(s) Signed: 11/05/2020 6:06:32 PM By: Patrick Shelton Entered By: Patrick Shelton on 10/10/2020 15:15:16 -------------------------------------------------------------------------------- Encounter Discharge Information Details Patient Name: Date of Service: Patrick Shelton 10/09/2020 2:30 PM Medical Record Number: [id number] Patient Account Number: [id number] Date of Birth/Sex: Treating RN: 20-Jan-1941 (80 y.o. Patrick Shelton Primary Care  Patrick Shelton: Patrick Shelton Other Clinician: Referring Wakisha Alberts: Treating Patrick Shelton Elms/Extender: Patrick Shelton Altheimer, Patrick Shelton in Treatment: 11 Encounter Discharge Information Items Discharge Condition: Stable Ambulatory Status: Wheelchair Discharge Destination: Hospital Telephoned: No Orders Sent: Yes Transportation: Private Auto Accompanied By: spouse Schedule Follow-up Appointment: Yes Clinical Summary of Care: Electronic Signature(s) Signed: 10/26/2020 6:06:32 PM By: Patrick Shelton Entered By: Patrick Shelton on 10/16/2020 15:15:57 -------------------------------------------------------------------------------- Lower Extremity Assessment Details Patient Name: Date of Service: Patrick Shelton 10/24/2020 2:30 PM Medical Record Number: [id number] Patient Account Number: [id number] Date of Birth/Sex: Treating RN: 1940-06-10 (80 y.o. Patrick Shelton Mention Primary Care [place]: Patrick Shelton Other Clinician: Donavan Shelton Referring Dareld Mcauliffe: Treating Elizar Alpern/Extender: Patrick Shelton Altheimer, Patrick Shelton in Treatment: 11 Edema Assessment Assessed: Shirlyn Goltz: No] Patrice Paradise: No] Edema: [Left: Ye] [Right: s] Calf Left: Right: Point of Measurement: 38 cm From Medial Instep 43.6 cm Ankle Left: Right: Point of Measurement: 0 cm From Medial Instep 29.4 cm Vascular Assessment Pulses: Dorsalis Pedis Palpable: [Left:No] Electronic Signature(s) Signed: 11/05/2020 4:49:46 PM By: Baruch Gouty RN, BSN Signed: 11/08/2020 4:26:36 PM By: Patrick Shelton EMT Entered By: Patrick Shelton on 10/17/2020 14:52:13 -------------------------------------------------------------------------------- Multi-Disciplinary Care Plan Details Patient Name: Date of Service: Patrick Shelton 10/07/2020 2:30 PM Medical Record Number: [id number] Patient Account Number: [id number] Date of Birth/Sex: Treating RN: 1940/04/12 (80 y.o. Patrick Shelton Primary Care Aubreyanna Dorrough: Altheimer,  Patrick Shelton Other Clinician: Referring Deanna Boehlke: Treating Jema Deegan/Extender: Patrick Shelton Altheimer, Patrick Shelton in Treatment: [place] reviewed with physician Active Inactive Electronic Signature(s) Signed: 11/20/2020 5:30:34 PM By: Patrick Shelton Signed: 12/05/2020 6:02:00 PM By: Baruch Gouty RN, BSN Previous Signature: 10/15/2020 6:06:32 PM Version By: Patrick Shelton Entered By: Baruch Gouty on 12/03/2020 16:26:04 -------------------------------------------------------------------------------- Pain Assessment Details Patient Name: Date of Service: Patrick Shelton, Patrick Shelton  10/21/2020 2:30 PM Medical Record Number: [id number] Patient Account Number: [id number] Date of Birth/Sex: Treating RN: 12/08/1940 (80 y.o. Patrick Shelton Mention Primary Care Dayon Witt: Patrick Shelton Other Clinician: Referring Athony Coppa: Treating Shareena Nusz/Extender: Patrick Shelton Altheimer, Patrick Shelton in Treatment: 11 Active Problems Location of Pain Severity and Description of Pain Patient Has Paino Yes Site Locations Pain Location: Pain Location: Generalized Pain Duration of the Pain. Constant / Intermittento Constant Rate the pain. Current Pain Level: 6 Worst Pain Level: 8 Least Pain Level: 3 Tolerable Pain Level: 2 Character of Pain Describe the Pain: Burning, Stabbing Pain Management and Medication Current Pain Management: Electronic Signature(s) Signed: 11/02/2020 4:49:46 PM By: Baruch Gouty RN, BSN Signed: 11/08/2020 4:26:36 PM By: Patrick Shelton EMT Entered By: Patrick Shelton on 11/03/2020 14:47:02 -------------------------------------------------------------------------------- Patient/Caregiver Education Details Patient Name: Date of Service: Patrick Shelton 08/04/2022andnbsp2:30 PM Medical Record Number: [id number] Patient Account Number: [id number] Date of Birth/Gender: Treating RN: 11/05/1940 (80 y.o. Patrick Shelton Primary Care Physician:  Patrick Shelton Other Clinician: Referring Physician: Treating Physician/Extender: Patrick Shelton Altheimer, Patrick Shelton in Treatment: 11 Education Assessment Education Provided To: Patient and Caregiver Education Topics Provided Elevated Blood Sugar/ Impact on Healing: Handouts: Elevated Blood Sugars: How Do They Affect Wound Healing Methods: Explain/Verbal, Printed Responses: Reinforcements needed Infection: Handouts: CDC antimicrobial patient education_English, Infection Prevention and Management Methods: Explain/Verbal, Printed Responses: Reinforcements needed Electronic Signature(s) Signed: 10/10/2020 6:06:32 PM By: Patrick Shelton Entered By: Patrick Shelton on 10/22/2020 15:14:26 -------------------------------------------------------------------------------- Wound Assessment Details Patient Name: Date of Service: Patrick Shelton 10/07/2020 2:30 PM Medical Record Number: [id number] Patient Account Number: [id number] Date of Birth/Sex: Treating RN: 1940/08/30 (80 y.o. Patrick Shelton Mention Primary Care Rehman Levinson: Patrick Shelton Other Clinician: Referring Azalia Neuberger: Treating Daman Steffenhagen/Extender: Patrick Shelton Altheimer, Patrick Shelton in Treatment: 11 Wound Status Wound Number: 1 Primary Diabetic Wound/Ulcer of the Lower Extremity Etiology: Wound Location: Left T Fourth oe Wound Open Wounding Event: Trauma Status: Date Acquired: 05/13/2020 Comorbid Cataracts, Deep Vein Thrombosis, Hypertension, Peripheral Weeks Of Treatment: 11 History: Venous Disease, Type II Diabetes, Gout, Osteoarthritis, Clustered Wound: No Neuropathy, Received Radiation Pending Amputation On Presentation Photos Wound Measurements Length: (cm) 2.3 Width: (cm) 2.4 Depth: (cm) 0.1 Area: (cm) 4.335 Volume: (cm) 0.434 % Reduction in Area: -203.4% % Reduction in Volume: -203.5% Epithelialization: Small (1-33%) Wound Description Classification: Grade 2 Wound Margin: Distinct,  outline attached Exudate Amount: Medium Exudate Type: Serosanguineous Exudate Color: red, brown Foul Odor After Cleansing: No Slough/Fibrino Yes Wound Bed Granulation Amount: Small (1-33%) Exposed Structure Granulation Quality: Pink Fascia Exposed: No Necrotic Amount: Large (67-100%) Fat Layer (Subcutaneous Tissue) Exposed: Yes Necrotic Quality: Eschar, Adherent Slough Tendon Exposed: No Muscle Exposed: No Joint Exposed: No Bone Exposed: No Electronic Signature(s) Signed: 10/16/2020 4:49:46 PM By: Baruch Gouty RN, BSN Signed: 11/08/2020 4:26:36 PM By: Patrick Shelton EMT Entered By: Patrick Shelton on 10/14/2020 15:02:33 -------------------------------------------------------------------------------- Vitals Details Patient Name: Date of Service: Patrick Shelton 10/25/2020 2:30 PM Medical Record Number: [id number] Patient Account Number: [id number] Date of Birth/Sex: Treating RN: November 13, 1940 (80 y.o. Patrick Shelton Mention Primary Care Avien Taha: Patrick Shelton Other Clinician: Referring Teana Lindahl: Treating Wasyl Dornfeld/Extender: Patrick Shelton Altheimer, Patrick Shelton in Treatment: 11 Vital Signs Time Taken: 14:41 Temperature (F): 98.2 Height (in): 71 Pulse (bpm): 80 Source: Stated Respiratory Rate (breaths/min): 22 Weight (lbs): 260 Blood Pressure (mmHg): 149/67 Source: Stated Reference Range: 80 - 120 mg / dl Body Mass Index (BMI): 36.3 Electronic Signature(s) Signed:  11/08/2020 4:26:36 PM By: Patrick Shelton EMT Entered By: Patrick Shelton on 10/15/2020 14:45:10

## 2020-11-09 DIAGNOSIS — E1122 Type 2 diabetes mellitus with diabetic chronic kidney disease: Secondary | ICD-10-CM

## 2020-11-09 DIAGNOSIS — N179 Acute kidney failure, unspecified: Secondary | ICD-10-CM

## 2020-11-09 DIAGNOSIS — E11621 Type 2 diabetes mellitus with foot ulcer: Secondary | ICD-10-CM

## 2020-11-09 DIAGNOSIS — Z89511 Acquired absence of right leg below knee: Secondary | ICD-10-CM

## 2020-11-09 DIAGNOSIS — E1152 Type 2 diabetes mellitus with diabetic peripheral angiopathy with gangrene: Secondary | ICD-10-CM | POA: Diagnosis not present

## 2020-11-09 DIAGNOSIS — N183 Chronic kidney disease, stage 3 unspecified: Secondary | ICD-10-CM

## 2020-11-09 DIAGNOSIS — E11628 Type 2 diabetes mellitus with other skin complications: Secondary | ICD-10-CM | POA: Diagnosis not present

## 2020-11-09 LAB — CBC
HCT: 33.4 % — ABNORMAL LOW (ref 39.0–52.0)
Hemoglobin: 10.8 g/dL — ABNORMAL LOW (ref 13.0–17.0)
MCH: 29.9 pg (ref 26.0–34.0)
MCHC: 32.3 g/dL (ref 30.0–36.0)
MCV: 92.5 fL (ref 80.0–100.0)
Platelets: 306 10*3/uL (ref 150–400)
RBC: 3.61 MIL/uL — ABNORMAL LOW (ref 4.22–5.81)
RDW: 14 % (ref 11.5–15.5)
WBC: 26.2 10*3/uL — ABNORMAL HIGH (ref 4.0–10.5)
nRBC: 0 % (ref 0.0–0.2)

## 2020-11-09 LAB — BASIC METABOLIC PANEL
Anion gap: 7 (ref 5–15)
BUN: 51 mg/dL — ABNORMAL HIGH (ref 8–23)
CO2: 25 mmol/L (ref 22–32)
Calcium: 8.6 mg/dL — ABNORMAL LOW (ref 8.9–10.3)
Chloride: 103 mmol/L (ref 98–111)
Creatinine, Ser: 2.32 mg/dL — ABNORMAL HIGH (ref 0.61–1.24)
GFR, Estimated: 28 mL/min — ABNORMAL LOW (ref >60)
Glucose, Bld: 57 mg/dL — ABNORMAL LOW (ref 70–99)
Potassium: 4.3 mmol/L (ref 3.5–5.1)
Sodium: 135 mmol/L (ref 135–145)

## 2020-11-09 LAB — GLUCOSE, CAPILLARY
Glucose-Capillary: 94 mg/dL (ref 70–99)
Glucose-Capillary: 142 mg/dL — ABNORMAL HIGH (ref 70–99)
Glucose-Capillary: 232 mg/dL — ABNORMAL HIGH (ref 70–99)

## 2020-11-09 LAB — APTT: aPTT: 31 seconds (ref 24–36)

## 2020-11-09 MED ORDER — HEPARIN (PORCINE) 25000 UT/250ML-% IV SOLN
2300.0000 [IU]/h | INTRAVENOUS | Status: DC
  Administered 2020-11-09: 1450 [IU]/h via INTRAVENOUS
  Administered 2020-11-10: 1700 [IU]/h via INTRAVENOUS
  Administered 2020-11-10: 2300 [IU]/h via INTRAVENOUS
  Filled 2020-11-09 (×5): qty 250

## 2020-11-09 MED ORDER — SODIUM CHLORIDE 0.45 % IV SOLN: INTRAVENOUS | Status: AC

## 2020-11-09 NOTE — Plan of Care (Signed)
?  Problem: Clinical Measurements: ?Goal: Ability to maintain clinical measurements within normal limits will improve ?Outcome: Progressing ?Goal: Will remain free from infection ?Outcome: Progressing ?Goal: Diagnostic test results will improve ?Outcome: Progressing ?Goal: Respiratory complications will improve ?Outcome: Progressing ?Goal: Cardiovascular complication will be avoided ?Outcome: Progressing ?  ?Problem: Activity: ?Goal: Risk for activity intolerance will decrease ?Outcome: Progressing ?  ?Problem: Elimination: ?Goal: Will not experience complications related to bowel motility ?Outcome: Progressing ?Goal: Will not experience complications related to urinary retention ?Outcome: Progressing ?  ?Problem: Pain Managment: ?Goal: General experience of comfort will improve ?Outcome: Progressing ?  ?Problem: Safety: ?Goal: Ability to remain free from injury will improve ?Outcome: Progressing ?  ?Problem: Skin Integrity: ?Goal: Risk for impaired skin integrity will decrease ?Outcome: Progressing ?  ?

## 2020-11-09 NOTE — Progress Notes (Signed)
Patient BGM this AM 79, was lower this AM before my shift. Glucose tablets given by Night RN. Patient decided to turn his insulin pump off for the moment. Patient has poor appetite, not eating much.  BGM in the 90's mid morning.   Lunch BGM 149, pump says 132. Patient does not want to eat lunch. Spoke with patient about discontinuing pump and letting us management it better or titrating the amount that is getting infused every hour. Patient states he will turn on his pump in a little and just continue the amount it has been. He does not want it to be discontinued.   Dr. [place] Pink made aware. States to try Glucerna, but there is nothing else that can be done at the moment if the patient does not want to discontinue pump.

## 2020-11-09 NOTE — Progress Notes (Addendum)
ANTICOAGULATION CONSULT NOTE - Follow Consult  Pharmacy Consult for Heparin Indication: atrial fibrillation and history of DVT  Allergies  Allergen Reactions   Prednisone Anaphylaxis and Shortness Of Breath   Dulaglutide Nausea Only   Atorvastatin    Lovastatin     Other reaction(s): OTHER REACTION    Patient Measurements: Height: '5\' 10"'$  (177.8 cm) Weight: 117.9 kg (259 lb 14.8 oz) IBW/kg (Calculated) : 73 kg Heparin Dosing Weight: 99.2 kg  Vital Signs: Temp: 98.6 F (37 C) (09/03 1505) Temp Source: Oral (09/03 1505) BP: 154/76 (09/03 1505) Pulse Rate: 75 (09/03 1505)  Labs: Recent Labs    11/07/20 0414 11/08/20 0232 11/09/20 0159 11/09/20 1831  HGB 12.4* 11.2* 10.8*  --   HCT 39.1 35.2* 33.4*  --   PLT 271 304 306  --   APTT  --   --   --  31  CREATININE 2.19* 2.37* 2.32*  --      Estimated Creatinine Clearance: 32.7 mL/min (A) (by C-G formula based on SCr of 2.32 mg/dL (H)).   Medical History: Past Medical History:  Diagnosis Date   Arthritis Sept. 2015   Rh. Neck and Upper Back   Arthritis Sept. 2015   Gout-Right Hand  Left knee   Cancer ([place])    8 wks Radiation   Chronic kidney disease    Diabetes mellitus    ED (erectile dysfunction)    Hx of agent Orange exposure    while [place] in [place] during that conflict   Hyperlipidemia    Hypertension    PVD (peripheral vascular disease) ([place])    PVT (paroxysmal ventricular tachycardia) (HCC)    S/P BKA (below knee amputation) [facility])     Assessment: 80 yo male presents with possible osteomyelitis. PTA the patient is on apixaban for atrial fibrillation (last dose 9/1 @ 0543). The patient also reports a history of DVT that was >10 years ago and was treated with warfarin. The patient is planned for LLE amputation on 9/6. Apixaban will be held until post procedure. Pharmacy is consulted to dose heparin.   Will monitor therapy with aPTTs due to the recent apixaban administration. Once the aPTT begins to  correlate with the heparin level, will monitor therapy with heparin levels only. Hgb 10.8, platelets 306.  Heparin drip 1400 uts/hr aptt 31 sec < goal   Goal of Therapy:  Heparin level 0.3-0.7 units/ml aPTT 66-102 seconds Monitor platelets by anticoagulation protocol: Yes   Plan:  Increase heparin IV at 1700 units/hrT Monitor a daily aPTT, heparin level, and CBC Watch for signs and symptoms of bleeding   Bonnita Nasuti Pharm.D. CPP, BCPS Clinical Pharmacist [phone] 11/09/2020 9:09 PM    Please check [email or web address] for unit-specific pharmacy phone numbers.

## 2020-11-09 NOTE — Plan of Care (Signed)

## 2020-11-09 NOTE — Progress Notes (Signed)
   VASCULAR SURGERY ASSESSMENT & PLAN:   PAD WITH NONHEALING WOUND LEFT FOOT: He has refused on multiple occasions arteriography.  He is agreeable to proceed with an amputation.  His duplex scan showed multiphasic flow down to the distal SFA where he had a significant stenosis.  He had monophasic flow below that.  I think he likely does have adequate arterial flow to heal a below the knee amputation however he has significant swelling related to his chronic venous disease which would increase his risk of nonhealing.  I think if we do a below the knee amputation he has a 15% chance of not healing.  He is ambulatory with his prosthesis on the right and I told him if he wishes to try to get a prosthesis on the left it would be worth trying below the knee.  However he is 80 years old with significant obesity and I think this would certainly be a challenge for him to ambulate with bilateral prostheses.  I will schedule his left lower extremity amputation for Tuesday.  ANTICOAGULATION.  He was on Eliquis on admission.  It looks like this is being held.  I am not sure the indication for this from the records.  I do not see that he is currently on IV heparin.  SUBJECTIVE:   No specific complaints.  PHYSICAL EXAM:   Vitals:   11/08/20 0815 11/08/20 1728 11/08/20 1741 11/08/20 2108  BP: (!) 145/84  (!) 167/68 (!) 155/74  Pulse: 90  93 80  Resp: '17  20 18  '$ Temp: 97.6 F (36.4 C) 98.1 F (36.7 C) 98.1 F (36.7 C) 98.6 F (37 C)  TempSrc: Oral Oral Oral Oral  SpO2: 91%  100% 97%  Weight:      Height:       Body mass index is 37.29 kg/m.   The wound on his left foot is unchanged. He has moderate left leg swelling and hyperpigmentation consistent with chronic venous insufficiency.  LABS:   Lab Results  Component Value Date   WBC 26.2 (H) 11/09/2020   HGB 10.8 (L) 11/09/2020   HCT 33.4 (L) 11/09/2020   MCV 92.5 11/09/2020   PLT 306 11/09/2020   Lab Results  Component Value Date    CREATININE 2.32 (H) 11/09/2020   Lab Results  Component Value Date   INR 2.0 05/24/2019   CBG (last 3)  Recent Labs    11/08/20 1530 11/08/20 1726 11/08/20 2106  GLUCAP 196* 78 128*    PROBLEM LIST:    Principal Problem:   Diabetic infection of left foot ([place]) Active Problems:   Diabetes mellitus (HCC)   CKD (chronic kidney disease) stage 3, GFR 30-59 ml/min (HCC)   Hx of right BKA (HCC)   AKI (acute kidney injury) ([place])   CURRENT MEDS:    (feeding supplement) PROSource Plus  30 mL Oral BID BM   allopurinol  100 mg Oral Daily   amLODipine  5 mg Oral Daily   cholecalciferol  5,000 Units Oral Daily   insulin pump   Subcutaneous TID WC, HS, 0200   metoprolol tartrate  25 mg Oral BID   metroNIDAZOLE  500 mg Oral Q12H   pravastatin  40 mg Oral q1800    Deitra Mayo Office: [phone] 11/09/2020

## 2020-11-09 NOTE — Progress Notes (Signed)
ANTICOAGULATION CONSULT NOTE - Initial Consult  Pharmacy Consult for Heparin Indication: atrial fibrillation and history of DVT  Allergies  Allergen Reactions   Prednisone Anaphylaxis and Shortness Of Breath   Dulaglutide Nausea Only   Atorvastatin    Lovastatin     Other reaction(s): OTHER REACTION    Patient Measurements: Height: '5\' 10"'$  (177.8 cm) Weight: 117.9 kg (259 lb 14.8 oz) IBW/kg (Calculated) : 73 kg Heparin Dosing Weight: 99.2 kg  Vital Signs: Temp: 98.1 F (36.7 C) (09/03 0727) Temp Source: Oral (09/03 0727) BP: 169/84 (09/03 0727) Pulse Rate: 74 (09/03 0727)  Labs: Recent Labs    11/07/20 0414 11/08/20 0232 11/09/20 0159  HGB 12.4* 11.2* 10.8*  HCT 39.1 35.2* 33.4*  PLT 271 304 306  CREATININE 2.19* 2.37* 2.32*    Estimated Creatinine Clearance: 32.7 mL/min (A) (by C-G formula based on SCr of 2.32 mg/dL (H)).   Medical History: Past Medical History:  Diagnosis Date   Arthritis Sept. 2015   Rh. Neck and Upper Back   Arthritis Sept. 2015   Gout-Right Hand  Left knee   Cancer ([place])    8 wks Radiation   Chronic kidney disease    Diabetes mellitus    ED (erectile dysfunction)    Hx of agent Orange exposure    while [place] in [place] during that conflict   Hyperlipidemia    Hypertension    PVD (peripheral vascular disease) ([place])    PVT (paroxysmal ventricular tachycardia) (HCC)    S/P BKA (below knee amputation) [facility])     Assessment: 80 yo male presents with possible osteomyelitis. PTA the patient is on apixaban for atrial fibrillation (last dose 9/1 @ 0543). The patient also reports a history of DVT that was >10 years ago and was treated with warfarin. The patient is planned for LLE amputation on 9/6. Apixaban will be held until post procedure. Pharmacy is consulted to dose heparin.   Will monitor therapy with aPTTs due to the recent apixaban administration. Once the aPTT begins to correlate with the heparin level, will monitor therapy with  heparin levels only. Hgb 10.8, platelets 306.   Goal of Therapy:  Heparin level 0.3-0.7 units/ml aPTT 66-102 seconds Monitor platelets by anticoagulation protocol: Yes   Plan:  Initiate heparin IV at 1450 units/hr Obtain an 8-hour aPTT Monitor a daily aPTT, heparin level, and CBC Watch for signs and symptoms of bleeding  Shauna Hugh, PharmD, [place]  PGY-2 Pharmacy Resident 11/09/2020 10:04 AM  Please check [email or web address] for unit-specific pharmacy phone numbers.

## 2020-11-09 NOTE — Progress Notes (Signed)
[facility] PROGRESS NOTE   Phenix Sunshine [id number] DOB: [birth year] DOA: 10/18/2020  PCP: Lorne Skeens, MD  Brief History/Interval Summary: 80 y.o. male with medical history significant of DM2, HTN, HLD, PAD.  Pt is s/p BKA of RLE previously. 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  Not improved despite wound care and multiple ABx courses as outpt. Pt sent to ED due to concern for osteomyelitis and possible bacteremia.  Apparently had subjective fevers at home.  X-ray of the foot is unchanged from films done few weeks ago.  Patient was started on IV antibiotics.  Hospitalize for further management.  Consultants: Vascular surgery has been consulted  Procedures: None yet  Antibiotics: Anti-infectives (From admission, onward)    Start     Dose/Rate Route Frequency Ordered Stop   11/09/20 0600  vancomycin (VANCOREADY) IVPB 1500 mg/300 mL        1,500 mg 150 mL/hr over 120 Minutes Intravenous Every 48 hours 11/07/20 0147     11/07/20 1000  cefTRIAXone (ROCEPHIN) 2 g in sodium chloride 0.9 % 100 mL IVPB        2 g 200 mL/hr over 30 Minutes Intravenous Every 24 hours 11/07/20 0136 11/14/20 0959   11/07/20 1000  metroNIDAZOLE (FLAGYL) tablet 500 mg        500 mg Oral Every 12 hours 11/07/20 0136 11/14/20 0959   11/07/20 0200  vancomycin (VANCOREADY) IVPB 2000 mg/400 mL        2,000 mg 200 mL/hr over 120 Minutes Intravenous  Once 11/07/20 0147 11/07/20 0636   11/07/20 0045  piperacillin-tazobactam (ZOSYN) IVPB 3.375 g        3.375 g 100 mL/hr over 30 Minutes Intravenous  Once 11/07/20 0039 11/07/20 0204       Subjective/Interval History: Patient continues to have pain in the left foot.  Mentions that he was seen by Dr. Scot Dock this morning.  Plan is for surgery on Tuesday.  Seems to be in a better mood this morning.  Low glucose levels noted again.      Assessment/Plan:  Gangrenous left fourth toe in the setting of diabetes/peripheral artery disease Patient followed by vascular surgeon, Dr. Scot Dock on a regular basis.  Last seen in July.  Arteriogram was offered but patient declined.   Vascular surgery was consulted.  Patient not interested in angiogram due to risk of progression of her kidney disease.  Eliquis remains on hold.  Tentative plan is for left BKA early next week.   Patient had fever yesterday morning.  Remains on vancomycin, ceftriaxone and metronidazole.  WBC noted to be elevated again.  No further fever noted.  Source of fever is most likely gangrenous left fourth toe.  Vital signs noted to be stable.  Cultures have been negative so far. No previous positive cultures noted.  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  Acute failure likely secondary to infection and perhaps a component of hypovolemia.  Holding ARB and HCTZ.   Patient started on IV fluids.  Creatinine noted to be stable.  BUN is noted to be elevated.  Continue IV fluids.  Avoid nephrotoxic agents.     Diabetes mellitus, insulin-dependent Patient is on an insulin pump.  He has signed the insulin pump contract.   Patient has been seen by diabetes coordinator.  Glucose levels again noted to be low this morning.  He has had something to eat with blood sugar appears to be improving. Will discuss with surgeon as to what will be anticipated the duration of surgery.  Patient will need to be placed on dextrose infusion the night before surgery.  If it would be a prolonged surgery then it may be beneficial to take him off of the pump and place him on an insulin infusion.  If it would be a short surgery that we could potentially leave the pump on if the patient prefers.  If he does not then we will switch him to insulin infusion.     Normocytic anemia Likely due to chronic disease.  No evidence of  overt bleeding.  Slight drop in hemoglobin is dilutional.  Essential hypertension Continue amlodipine.  Permanent atrial fibrillation Not noted to be on any rate limiting drugs.  Eliquis is on hold.  Initiate IV heparin.  Morbid obesity Estimated body mass index is 37.29 kg/m as calculated from the following:   Height as of this encounter: '5\' 10"'$  (1.778 m).   Weight as of this encounter: 117.9 kg.   DVT Prophylaxis: Eliquis currently on hold.  IV heparin.   Code Status: Full code Family Communication: Discussed with patient Disposition Plan: To be determined  Status is: Inpatient  Remains inpatient appropriate because:Ongoing diagnostic testing needed not appropriate for outpatient work up, IV treatments appropriate due to intensity of illness or inability to take PO, and Inpatient level of care appropriate due to severity of illness  Dispo: The patient is from: Home              Anticipated d/c is to: Home              Patient currently is not medically stable to d/c.   Difficult to place patient No       Medications: Scheduled:  (feeding supplement) PROSource Plus  30 mL Oral BID BM   allopurinol  100 mg Oral Daily   amLODipine  5 mg Oral Daily   cholecalciferol  5,000 Units Oral Daily   insulin pump   Subcutaneous TID WC, HS, 0200   metoprolol tartrate  25 mg Oral BID   metroNIDAZOLE  500 mg Oral Q12H   pravastatin  40 mg Oral q1800   Continuous:  cefTRIAXone (ROCEPHIN)  IV 2 g (11/08/20 0930)   vancomycin 1,500 mg (11/09/20 0514)   [id number] **OR** acetaminophen, HYDROmorphone (DILAUDID) injection, ondansetron **OR** ondansetron (ZOFRAN) IV, oxyCODONE, white petrolatum   Objective:  Vital Signs  Vitals:   11/08/20 1728 11/08/20 1741 11/08/20 2108 11/09/20 0727  BP:  (!) 167/68 (!) 155/74 (!) 169/84  Pulse:  93 80 74  Resp:  '20 18 16  '$ Temp: 98.1 F (36.7 C) 98.1 F (36.7 C) 98.6 F (37 C) 98.1 F (36.7 C)  TempSrc: Oral Oral Oral Oral   SpO2:  100% 97% 95%  Weight:      Height:        Intake/Output Summary (Last 24 hours) at 11/09/2020 0957 Last data filed at 11/09/2020 0549 Gross per 24 hour  Intake 1670.12 ml  Output --  Net 1670.12 ml    Filed Weights   11/05/2020 1954  Weight: 117.9 kg    General appearance: Awake alert.  In no distress Resp: Clear to auscultation bilaterally.  Normal effort Cardio: S1-S2 is normal regular.  No S3-S4.  No rubs murmurs or bruit GI: Abdomen is soft.  Nontender nondistended.  Bowel sounds are present normal.  No masses  organomegaly Extremities: Status post right BKA previously.  Gangrenous appearing left fourth toe with foul smell.  Poorly palpable pulses. Neurologic: Alert and oriented x3.  No focal neurological deficits.       Lab Results:  Data Reviewed: I have personally reviewed following labs and imaging studies  CBC: Recent Labs  Lab 10/22/2020 2031 11/07/20 0414 11/08/20 0232 11/09/20 0159  WBC 20.0* 16.8* 21.0* 26.2*  NEUTROABS 14.8*  --   --   --   HGB 12.3* 12.4* 11.2* 10.8*  HCT 37.0* 39.1 35.2* 33.4*  MCV 92.5 94.4 93.1 92.5  PLT 280 271 304 306     Basic Metabolic Panel: Recent Labs  Lab 10/18/2020 2031 11/07/20 0414 11/08/20 0232 11/09/20 0159  NA 136 134* 135 135  K 4.0 4.0 4.1 4.3  CL 97* 101 99 103  CO2 '26 23 29 25  '$ GLUCOSE 117* 129* 75 57*  BUN 51* 51* 46* 51*  CREATININE 2.35* 2.19* 2.37* 2.32*  CALCIUM 8.9 8.6* 8.8* 8.6*     GFR: Estimated Creatinine Clearance: 32.7 mL/min (A) (by C-G formula based on SCr of 2.32 mg/dL (H)).  Liver Function Tests: Recent Labs  Lab 10/11/2020 2031  AST 31  ALT 31  ALKPHOS 74  BILITOT 0.2*  PROT 7.6  ALBUMIN 2.6*      CBG: Recent Labs  Lab 11/08/20 0816 11/08/20 1202 11/08/20 1530 11/08/20 1726 11/08/20 2106  GLUCAP 186* 228* 196* 78 128*       Recent Results (from the past 240 hour(s))  Resp Panel by RT-PCR (Flu A&B, Covid) Nasopharyngeal Swab     Status: None   Collection  Time: 10/17/2020  7:59 PM   Specimen: Nasopharyngeal Swab; Nasopharyngeal(NP) swabs in vial transport medium  Result Value Ref Range Status   SARS Coronavirus 2 by RT PCR NEGATIVE NEGATIVE Final    Comment: (NOTE) SARS-CoV-2 target nucleic acids are NOT DETECTED.  The SARS-CoV-2 RNA is generally detectable in upper respiratory specimens during the acute phase of infection. The lowest concentration of SARS-CoV-2 viral copies this assay can detect is 138 copies/mL. A negative result does not preclude SARS-Cov-2 infection and should not be used as the sole basis for treatment or other patient management decisions. A negative result may occur with  improper specimen collection/handling, submission of specimen other than nasopharyngeal swab, presence of viral mutation(s) within the areas targeted by this assay, and inadequate number of viral copies(<138 copies/mL). A negative result must be combined with clinical observations, patient history, and epidemiological information. The expected result is Negative.  Fact Sheet for Patients:  EntrepreneurPulse.com.au  Fact Sheet for Healthcare Providers:  IncredibleEmployment.be  This test is no t yet approved or cleared by the [place] FDA and  has been authorized for detection and/or diagnosis of SARS-CoV-2 by FDA under an Emergency Use Authorization (EUA). This EUA will remain  in effect (meaning this test can be used) for the duration of the COVID-19 declaration under Section 564(b)(1) of the Act, 21 U.S.C.section 360bbb-3(b)(1), unless the authorization is terminated  or revoked sooner.       Influenza A by PCR NEGATIVE NEGATIVE Final   Influenza B by PCR NEGATIVE NEGATIVE Final    Comment: (NOTE) The Xpert Xpress SARS-CoV-2/FLU/RSV plus assay is intended as an aid in the diagnosis of influenza from Nasopharyngeal swab specimens and should not be used as a sole basis for treatment. Nasal washings  and aspirates are unacceptable for Xpert Xpress SARS-CoV-2/FLU/RSV testing.  Fact Sheet for Patients: EntrepreneurPulse.com.au  Fact Sheet for Healthcare Providers: IncredibleEmployment.be  This test is not yet approved or cleared by the [place] FDA and has been authorized for detection and/or diagnosis of SARS-CoV-2 by FDA under an Emergency Use Authorization (EUA). This EUA will remain in effect (meaning this test can be used) for the duration of the COVID-19 declaration under Section 564(b)(1) of the Act, 21 U.S.C. section 360bbb-3(b)(1), unless the authorization is terminated or revoked.  Performed at [facility], [place] [address] [place], [place] [address]   Blood culture (routine x 2)     Status: None (Preliminary result)   Collection Time: 10/28/2020  8:15 PM   Specimen: BLOOD RIGHT HAND  Result Value Ref Range Status   Specimen Description BLOOD RIGHT HAND  Final   Special Requests   Final    BOTTLES DRAWN AEROBIC AND ANAEROBIC Blood Culture results may not be optimal due to an excessive volume of blood received in culture bottles   Culture   Final    NO GROWTH 3 DAYS Performed at [facility], [place] [address] [place], [place] [address]    Report Status PENDING  Incomplete  Blood culture (routine x 2)     Status: None (Preliminary result)   Collection Time: 10/22/2020  8:32 PM   Specimen: BLOOD LEFT ARM  Result Value Ref Range Status   Specimen Description BLOOD LEFT ARM  Final   Special Requests   Final    BOTTLES DRAWN AEROBIC ONLY Blood Culture adequate volume   Culture   Final    NO GROWTH 3 DAYS Performed at [facility], [place] [address], [place], [place] [address]    Report Status PENDING  Incomplete  MRSA Next Gen by PCR, Nasal     Status: None   Collection Time: 11/07/20  3:29 AM   Specimen: Nasal Mucosa; Nasal Swab  Result Value Ref Range Status   MRSA by PCR Next Gen NOT DETECTED NOT DETECTED  Final    Comment: (NOTE) The GeneXpert MRSA Assay (FDA approved for NASAL specimens only), is one component of a comprehensive MRSA colonization surveillance program. It is not intended to diagnose MRSA infection nor to guide or monitor treatment for MRSA infections. Test performance is not FDA approved in patients less than 23 years old. Performed at [facility], [place] [address] [place], [place] [address]        Radiology Studies: VAS [place] LOWER EXTREMITY ARTERIAL DUPLEX  Result Date: 11/07/2020 LOWER EXTREMITY ARTERIAL DUPLEX STUDY Patient Name:  JARVON SCHOPP  Date of Exam:   11/07/2020 Medical Rec #: [id number]       Accession #:    [id number] Date of Birth: 03-30-1940       Patient Gender: M Patient Age:   57 years Exam Location:  [facility] Procedure:      VAS [place] LOWER EXTREMITY ARTERIAL DUPLEX Referring Phys: Karoline Caldwell --------------------------------------------------------------------------------  Indications: Ulceration, gangrene, and peripheral artery disease. High Risk Factors: Hypertension, hyperlipidemia, Diabetes, past history of                    smoking.  Vascular Interventions: Right BKA. Current ABI:            n/a Limitations: Body habitus, limited patient cooperation, tachycardia Comparison Study: No prior LEA duplex Performing Technologist: Sharion Dove RVS  Examination Guidelines: A complete evaluation includes B-mode imaging, spectral Doppler, color Doppler, and power Doppler as needed of all accessible portions of  each vessel. Bilateral testing is considered an integral part of a complete examination. Limited examinations for reoccurring indications may be performed as noted.    +----------+--------+-----+---------------+-----------+--------+ LEFT      PSV cm/sRatioStenosis       Waveform   Comments +----------+--------+-----+---------------+-----------+--------+ CFA Prox  121                         multiphasic          +----------+--------+-----+---------------+-----------+--------+ SFA Prox  205          50-74% stenosismultiphasic         +----------+--------+-----+---------------+-----------+--------+ SFA Mid   82                          multiphasic         +----------+--------+-----+---------------+-----------+--------+ SFA Distal87                          multiphasic         +----------+--------+-----+---------------+-----------+--------+ POP Prox  47                          monophasic          +----------+--------+-----+---------------+-----------+--------+ POP Distal29                          multiphasic         +----------+--------+-----+---------------+-----------+--------+ PTA Prox  79                          monophasic          +----------+--------+-----+---------------+-----------+--------+ PTA Mid   36                          monophasic          +----------+--------+-----+---------------+-----------+--------+ PTA Distal141          30-49% stenosismonophasic          +----------+--------+-----+---------------+-----------+--------+  Summary: Left: 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 30-49% stenosis noted in the posterior tibial artery.  See table(s) above for measurements and observations. Electronically signed by Jamelle Haring on 11/07/2020 at 3:32:34 PM.    Final        LOS: 2 days   [facility] Pager on [email or web address]  11/09/2020, 9:57 AM

## 2020-11-09 NOTE — Progress Notes (Signed)
Pharmacy Antibiotic Note  Patrick Shelton is a 80 y.o. male admitted on 10/22/2020 with gangrenous L 4th toe and suspected osteomyelitis. WBC 26.2, Scr 2.32, Tmax 100 F. PTA the patient has had a RLE BKA. The patient is planned for a LLE amputation on 9/6. The patient is also on ceftriaxone and metronidazole. Pharmacy has been consulted for vancomycin dosing.  Plan: -Continue vancomycin IV 1500 mg Q48H (eAUC 486.3, goal 400-550, Scr 2.32) -Continue ceftriaxone IV 2 g Q24H and metronidazole PO 500 mg Q12H -Follow antibiotic length of therapy, planned for 7 days -Monitor clinical status, renal function, and cultures for any changes in therapy  Height: '5\' 10"'$  (177.8 cm) Weight: 117.9 kg (259 lb 14.8 oz) IBW/kg (Calculated) : 73  Temp (24hrs), Avg:98.2 F (36.8 C), Min:98.1 F (36.7 C), Max:98.6 F (37 C)  Recent Labs  Lab 10/17/2020 2031 10/30/2020 2032 11/07/20 0140 11/07/20 0414 11/08/20 0232 11/09/20 0159  WBC 20.0*  --   --  16.8* 21.0* 26.2*  CREATININE 2.35*  --   --  2.19* 2.37* 2.32*  LATICACIDVEN  --  1.4 1.3  --   --   --     Estimated Creatinine Clearance: 32.7 mL/min (A) (by C-G formula based on SCr of 2.32 mg/dL (H)).    Allergies  Allergen Reactions   Prednisone Anaphylaxis and Shortness Of Breath   Dulaglutide Nausea Only   Atorvastatin    Lovastatin     Other reaction(s): OTHER REACTION    Antimicrobials this admission: Piperacillin/tazobactam 9/1 x1 dose Vancomycin 9/1 >>  Ceftriaxone 9/1 >>  Metronidazole 9/1 >>   Microbiology results: 8/31 Bcx: NGTD 8/32 COVID/Flu: negative 9/1 MRSA: negative  Thank you for allowing pharmacy to be a part of this patient's care.  Shauna Hugh, PharmD, [place]  PGY-2 Pharmacy Resident 11/09/2020 10:29 AM  Please check [email or web address] for unit-specific pharmacy phone numbers.

## 2020-11-10 DIAGNOSIS — E1152 Type 2 diabetes mellitus with diabetic peripheral angiopathy with gangrene: Secondary | ICD-10-CM | POA: Diagnosis not present

## 2020-11-10 DIAGNOSIS — N183 Chronic kidney disease, stage 3 unspecified: Secondary | ICD-10-CM | POA: Diagnosis not present

## 2020-11-10 DIAGNOSIS — N179 Acute kidney failure, unspecified: Secondary | ICD-10-CM | POA: Diagnosis not present

## 2020-11-10 DIAGNOSIS — E11628 Type 2 diabetes mellitus with other skin complications: Secondary | ICD-10-CM | POA: Diagnosis not present

## 2020-11-10 LAB — BASIC METABOLIC PANEL
Anion gap: 8 (ref 5–15)
BUN: 52 mg/dL — ABNORMAL HIGH (ref 8–23)
CO2: 24 mmol/L (ref 22–32)
Calcium: 8.6 mg/dL — ABNORMAL LOW (ref 8.9–10.3)
Chloride: 103 mmol/L (ref 98–111)
Creatinine, Ser: 2.18 mg/dL — ABNORMAL HIGH (ref 0.61–1.24)
GFR, Estimated: 30 mL/min — ABNORMAL LOW (ref >60)
Glucose, Bld: 112 mg/dL — ABNORMAL HIGH (ref 70–99)
Potassium: 4.4 mmol/L (ref 3.5–5.1)
Sodium: 135 mmol/L (ref 135–145)

## 2020-11-10 LAB — CBC
HCT: 34.2 % — ABNORMAL LOW (ref 39.0–52.0)
Hemoglobin: 11.2 g/dL — ABNORMAL LOW (ref 13.0–17.0)
MCH: 30.3 pg (ref 26.0–34.0)
MCHC: 32.7 g/dL (ref 30.0–36.0)
MCV: 92.4 fL (ref 80.0–100.0)
Platelets: 273 10*3/uL (ref 150–400)
RBC: 3.7 MIL/uL — ABNORMAL LOW (ref 4.22–5.81)
RDW: 14 % (ref 11.5–15.5)
WBC: 20.7 10*3/uL — ABNORMAL HIGH (ref 4.0–10.5)
nRBC: 0 % (ref 0.0–0.2)

## 2020-11-10 LAB — APTT
aPTT: 27 seconds (ref 24–36)
aPTT: 38 seconds — ABNORMAL HIGH (ref 24–36)

## 2020-11-10 LAB — GLUCOSE, CAPILLARY
Glucose-Capillary: 44 mg/dL — CL (ref 70–99)
Glucose-Capillary: 65 mg/dL — ABNORMAL LOW (ref 70–99)
Glucose-Capillary: 161 mg/dL — ABNORMAL HIGH (ref 70–99)
Glucose-Capillary: 58 mg/dL — ABNORMAL LOW (ref 70–99)
Glucose-Capillary: 94 mg/dL (ref 70–99)
Glucose-Capillary: 108 mg/dL — ABNORMAL HIGH (ref 70–99)
Glucose-Capillary: 240 mg/dL — ABNORMAL HIGH (ref 70–99)
Glucose-Capillary: 135 mg/dL — ABNORMAL HIGH (ref 70–99)

## 2020-11-10 LAB — HEPARIN LEVEL (UNFRACTIONATED): Heparin Unfractionated: 0.28 IU/mL — ABNORMAL LOW (ref 0.30–0.70)

## 2020-11-10 MED ORDER — HEPARIN BOLUS VIA INFUSION
2000.0000 [IU] | Freq: Once | INTRAVENOUS | Status: AC
  Administered 2020-11-10: 2000 [IU] via INTRAVENOUS
  Filled 2020-11-10: qty 2000

## 2020-11-10 MED ORDER — DEXTROSE-NACL 5-0.45 % IV SOLN: INTRAVENOUS | Status: AC

## 2020-11-10 NOTE — Progress Notes (Signed)
[facility] PROGRESS NOTE   Patrick Shelton [id number] DOB: 1941/02/16 DOA: 10/17/2020  PCP: Lorne Skeens, MD  Brief History/Interval Summary: 81 y.o. male with medical history significant of DM2, HTN, HLD, PAD.  Pt is s/p BKA of RLE previously. 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  Not improved despite wound care and multiple ABx courses as outpt. Pt sent to ED due to concern for osteomyelitis and possible bacteremia.  Apparently had subjective fevers at home.  X-ray of the foot is unchanged from films done few weeks ago.  Patient was started on IV antibiotics.  Hospitalize for further management.  Consultants: Vascular surgery  Procedures: None yet  Antibiotics: Anti-infectives (From admission, onward)    Start     Dose/Rate Route Frequency Ordered Stop   11/09/20 0600  vancomycin (VANCOREADY) IVPB 1500 mg/300 mL        1,500 mg 150 mL/hr over 120 Minutes Intravenous Every 48 hours 11/07/20 0147     11/07/20 1000  cefTRIAXone (ROCEPHIN) 2 g in sodium chloride 0.9 % 100 mL IVPB        2 g 200 mL/hr over 30 Minutes Intravenous Every 24 hours 11/07/20 0136 11/14/20 0959   11/07/20 1000  metroNIDAZOLE (FLAGYL) tablet 500 mg        500 mg Oral Every 12 hours 11/07/20 0136 11/14/20 0959   11/07/20 0200  vancomycin (VANCOREADY) IVPB 2000 mg/400 mL        2,000 mg 200 mL/hr over 120 Minutes Intravenous  Once 11/07/20 0147 11/07/20 0636   11/07/20 0045  piperacillin-tazobactam (ZOSYN) IVPB 3.375 g        3.375 g 100 mL/hr over 30 Minutes Intravenous  Once 11/07/20 0039 11/07/20 0204       Subjective/Interval History: Patient continues to have low glucose levels at night.  He switched off his insulin pump for 2 hours.  He wants to maintain control of his pump at this time.  He is however agreeable to starting IV fluids because of his poor oral intake.      Assessment/Plan:  Gangrenous left fourth toe in the setting of diabetes/peripheral artery disease Patient followed by vascular surgeon, Dr. Scot Dock on a regular basis.  Last seen in July.  Arteriogram was offered but patient declined.   Vascular surgery was consulted.  Patient not interested in angiogram due to risk of progression of her kidney disease.  Eliquis remains on hold.     Patient had fever yesterday morning.  Remains on vancomycin, ceftriaxone and metronidazole.  No further episodes of fever noted in the last 24 hours.  WBC is noted to be slightly better today.  Continue current antibiotics.  Cultures have been negative so far.  Plan is for a left below-knee amputation on Tuesday.   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  Acute failure likely secondary to infection and perhaps a component of hypovolemia.  Holding ARB and HCTZ.   Patient started on IV fluids.  Creatinine noted to be better today.  Continue IV fluids. Avoid nephrotoxic agents.     Diabetes mellitus, insulin-dependent Patient is on an insulin pump.  He has signed the insulin pump contract.   Patient has been seen by diabetes coordinator.  Continues to have episode of hypoglycemia most likely due to poor oral intake.  We will place him on D5 half-normal saline infusion  for now.  Continue insulin pump. Patient to monitor CBGs closely. Per vascular surgeon his surgery should not take more than 1 hour.  In view of this may be reasonable to just leave him on the pump.  We will continue with IV fluids with D5 NS from the night of 9/5.    Normocytic anemia Likely due to chronic disease.  No evidence of overt bleeding.  Slight drop in hemoglobin is dilutional.  Essential hypertension Continue amlodipine.  Permanent atrial fibrillation Not noted to be on any rate limiting drugs.  Eliquis is on hold.  Currently on IV heparin.  Morbid  obesity Estimated body mass index is 37.29 kg/m as calculated from the following:   Height as of this encounter: '5\' 10"'$  (1.778 m).   Weight as of this encounter: 117.9 kg.   DVT Prophylaxis: Eliquis currently on hold.  IV heparin.   Code Status: Full code Family Communication: Discussed with patient Disposition Plan: To be determined  Status is: Inpatient  Remains inpatient appropriate because:Ongoing diagnostic testing needed not appropriate for outpatient work up, IV treatments appropriate due to intensity of illness or inability to take PO, and Inpatient level of care appropriate due to severity of illness  Dispo: The patient is from: Home              Anticipated d/c is to: Home              Patient currently is not medically stable to d/c.   Difficult to place patient No       Medications: Scheduled:  (feeding supplement) PROSource Plus  30 mL Oral BID BM   allopurinol  100 mg Oral Daily   amLODipine  5 mg Oral Daily   cholecalciferol  5,000 Units Oral Daily   insulin pump   Subcutaneous TID WC, HS, 0200   metoprolol tartrate  25 mg Oral BID   metroNIDAZOLE  500 mg Oral Q12H   pravastatin  40 mg Oral q1800   Continuous:  sodium chloride 75 mL/hr at 11/09/20 1759   cefTRIAXone (ROCEPHIN)  IV 2 g (11/10/20 0832)   dextrose 5 % and 0.45% NaCl 75 mL/hr at 11/10/20 0831   heparin 2,100 Units/hr (11/10/20 0726)   vancomycin 1,500 mg (11/09/20 0514)   [id number] **OR** acetaminophen, HYDROmorphone (DILAUDID) injection, ondansetron **OR** ondansetron (ZOFRAN) IV, oxyCODONE, white petrolatum   Objective:  Vital Signs  Vitals:   11/09/20 1154 11/09/20 1505 11/09/20 2117 11/10/20 0753  BP: (!) 146/72 (!) 154/76 (!) 154/86 (!) 121/107  Pulse: 72 75 76 81  Resp: '16 18 18 18  '$ Temp: 99.5 F (37.5 C) 98.6 F (37 C) 98.6 F (37 C) 98.1 F (36.7 C)  TempSrc: Oral Oral Oral Oral  SpO2: 95% 90% 90% 92%  Weight:      Height:        Intake/Output Summary (Last  24 hours) at 11/10/2020 1012 Last data filed at 11/10/2020 0811 Gross per 24 hour  Intake 927.36 ml  Output 300 ml  Net 627.36 ml    Filed Weights   10/17/2020 1954  Weight: 117.9 kg    General appearance: Awake alert.  In no distress Resp: Clear to auscultation bilaterally.  Normal effort Cardio: S1-S2 is normal regular.  No S3-S4.  No rubs murmurs or bruit GI: Abdomen is soft.  Nontender nondistended.  Bowel sounds are present normal.  No masses organomegaly Extremities: Status post right BKA previously.  Gangrenous left fourth toe with foul smell. Neurologic: Alert  and oriented x3.  No focal neurological deficits.      Lab Results:  Data Reviewed: I have personally reviewed following labs and imaging studies  CBC: Recent Labs  Lab 10/22/2020 2031 11/07/20 0414 11/08/20 0232 11/09/20 0159 11/10/20 0517  WBC 20.0* 16.8* 21.0* 26.2* 20.7*  NEUTROABS 14.8*  --   --   --   --   HGB 12.3* 12.4* 11.2* 10.8* 11.2*  HCT 37.0* 39.1 35.2* 33.4* 34.2*  MCV 92.5 94.4 93.1 92.5 92.4  PLT 280 271 304 306 273     Basic Metabolic Panel: Recent Labs  Lab 11/01/2020 2031 11/07/20 0414 11/08/20 0232 11/09/20 0159 11/10/20 0517  NA 136 134* 135 135 135  K 4.0 4.0 4.1 4.3 4.4  CL 97* 101 99 103 103  CO2 '26 23 29 25 24  '$ GLUCOSE 117* 129* 75 57* 112*  BUN 51* 51* 46* 51* 52*  CREATININE 2.35* 2.19* 2.37* 2.32* 2.18*  CALCIUM 8.9 8.6* 8.8* 8.6* 8.6*     GFR: Estimated Creatinine Clearance: 34.8 mL/min (A) (by C-G formula based on SCr of 2.18 mg/dL (H)).  Liver Function Tests: Recent Labs  Lab 10/30/2020 2031  AST 31  ALT 31  ALKPHOS 74  BILITOT 0.2*  PROT 7.6  ALBUMIN 2.6*      CBG: Recent Labs  Lab 11/09/20 1704 11/10/20 0145 11/10/20 0202 11/10/20 0309 11/10/20 0654  GLUCAP 232* 44* 65* 161* 58*       Recent Results (from the past 240 hour(s))  Resp Panel by RT-PCR (Flu A&B, Covid) Nasopharyngeal Swab     Status: None   Collection Time: 10/26/2020  7:59  PM   Specimen: Nasopharyngeal Swab; Nasopharyngeal(NP) swabs in vial transport medium  Result Value Ref Range Status   SARS Coronavirus 2 by RT PCR NEGATIVE NEGATIVE Final    Comment: (NOTE) SARS-CoV-2 target nucleic acids are NOT DETECTED.  The SARS-CoV-2 RNA is generally detectable in upper respiratory specimens during the acute phase of infection. The lowest concentration of SARS-CoV-2 viral copies this assay can detect is 138 copies/mL. A negative result does not preclude SARS-Cov-2 infection and should not be used as the sole basis for treatment or other patient management decisions. A negative result may occur with  improper specimen collection/handling, submission of specimen other than nasopharyngeal swab, presence of viral mutation(s) within the areas targeted by this assay, and inadequate number of viral copies(<138 copies/mL). A negative result must be combined with clinical observations, patient history, and epidemiological information. The expected result is Negative.  Fact Sheet for Patients:  EntrepreneurPulse.com.au  Fact Sheet for Healthcare Providers:  IncredibleEmployment.be  This test is no t yet approved or cleared by the [place] FDA and  has been authorized for detection and/or diagnosis of SARS-CoV-2 by FDA under an Emergency Use Authorization (EUA). This EUA will remain  in effect (meaning this test can be used) for the duration of the COVID-19 declaration under Section 564(b)(1) of the Act, 21 U.S.C.section 360bbb-3(b)(1), unless the authorization is terminated  or revoked sooner.       Influenza A by PCR NEGATIVE NEGATIVE Final   Influenza B by PCR NEGATIVE NEGATIVE Final    Comment: (NOTE) The Xpert Xpress SARS-CoV-2/FLU/RSV plus assay is intended as an aid in the diagnosis of influenza from Nasopharyngeal swab specimens and should not be used as a sole basis for treatment. Nasal washings and aspirates are  unacceptable for Xpert Xpress SARS-CoV-2/FLU/RSV testing.  Fact Sheet for Patients: EntrepreneurPulse.com.au  Fact Sheet for  Healthcare Providers: IncredibleEmployment.be  This test is not yet approved or cleared by the [place] and has been authorized for detection and/or diagnosis of SARS-CoV-2 by FDA under an Emergency Use Authorization (EUA). This EUA will remain in effect (meaning this test can be used) for the duration of the COVID-19 declaration under Section 564(b)(1) of the Act, 21 U.S.C. section 360bbb-3(b)(1), unless the authorization is terminated or revoked.  Performed at [facility], [place] [address], [place], [place] [address]   Blood culture (routine x 2)     Status: None (Preliminary result)   Collection Time: 10/25/2020  8:15 PM   Specimen: BLOOD RIGHT HAND  Result Value Ref Range Status   Specimen Description BLOOD RIGHT HAND  Final   Special Requests   Final    BOTTLES DRAWN AEROBIC AND ANAEROBIC Blood Culture results may not be optimal due to an excessive volume of blood received in culture bottles   Culture   Final    NO GROWTH 4 DAYS Performed at [facility], [place] [address], [place], [place] [address]    Report Status PENDING  Incomplete  Blood culture (routine x 2)     Status: None (Preliminary result)   Collection Time: 10/27/2020  8:32 PM   Specimen: BLOOD LEFT ARM  Result Value Ref Range Status   Specimen Description BLOOD LEFT ARM  Final   Special Requests   Final    BOTTLES DRAWN AEROBIC ONLY Blood Culture adequate volume   Culture   Final    NO GROWTH 4 DAYS Performed at [facility], [place] [address] [place], [place] [address]    Report Status PENDING  Incomplete  MRSA Next Gen by PCR, Nasal     Status: None   Collection Time: 11/07/20  3:29 AM   Specimen: Nasal Mucosa; Nasal Swab  Result Value Ref Range Status   MRSA by PCR Next Gen NOT DETECTED NOT DETECTED Final    Comment:  (NOTE) The GeneXpert MRSA Assay (FDA approved for NASAL specimens only), is one component of a comprehensive MRSA colonization surveillance program. It is not intended to diagnose MRSA infection nor to guide or monitor treatment for MRSA infections. Test performance is not FDA approved in patients less than 43 years old. Performed at [facility], [place] [address] [place], [place] [address]        Radiology Studies: No results found.     LOS: 3 days   Rockie Vawter [employer] on [email or web address]  11/10/2020, 10:12 AM

## 2020-11-10 NOTE — Plan of Care (Signed)

## 2020-11-10 NOTE — Progress Notes (Signed)
ANTICOAGULATION CONSULT NOTE - Follow Consult  Pharmacy Consult for Heparin Indication: atrial fibrillation and history of DVT  Allergies  Allergen Reactions   Prednisone Anaphylaxis and Shortness Of Breath   Dulaglutide Nausea Only   Atorvastatin    Lovastatin     Other reaction(s): OTHER REACTION    Patient Measurements: Height: '5\' 10"'$  (177.8 cm) Weight: 117.9 kg (259 lb 14.8 oz) IBW/kg (Calculated) : 73 kg Heparin Dosing Weight: 99.2 kg  Vital Signs: Temp: 98.5 F (36.9 C) (09/04 1416) Temp Source: Oral (09/04 1416) BP: 135/112 (09/04 1416) Pulse Rate: 78 (09/04 1416)  Labs: Recent Labs    11/08/20 0232 11/09/20 0159 11/09/20 1831 11/10/20 0517 11/10/20 1439  HGB 11.2* 10.8*  --  11.2*  --   HCT 35.2* 33.4*  --  34.2*  --   PLT 304 306  --  273  --   APTT  --   --  31 27 38*  HEPARINUNFRC  --   --   --  0.28*  --   CREATININE 2.37* 2.32*  --  2.18*  --      Estimated Creatinine Clearance: 34.8 mL/min (A) (by C-G formula based on SCr of 2.18 mg/dL (H)).   Medical History: Past Medical History:  Diagnosis Date   Arthritis Sept. 2015   Rh. Neck and Upper Back   Arthritis Sept. 2015   Gout-Right Hand  Left knee   Cancer ([place])    8 wks Radiation   Chronic kidney disease    Diabetes mellitus    ED (erectile dysfunction)    Hx of agent Orange exposure    [place] during that conflict   Hyperlipidemia    Hypertension    PVD (peripheral vascular disease) ([place])    PVT (paroxysmal ventricular tachycardia) (HCC)    S/P BKA (below knee amputation) [facility])     Assessment: 80 yo male presents with possible osteomyelitis. PTA the patient is on apixaban for atrial fibrillation (last dose 9/1 @ 0543). The patient also reports a history of DVT that was >10 years ago and was treated with warfarin. The patient is planned for LLE amputation on 9/6. Apixaban will be held until post procedure. Pharmacy is consulted to dose heparin.   Will monitor  therapy with aPTTs due to the recent apixaban administration. Once the aPTT begins to correlate with the heparin level, will monitor therapy with heparin levels only. Hgb 11.2, platelets 273  Heparin drip 1400 uts/hr aptt 38 sec < goal   Goal of Therapy:  Heparin level 0.3-0.7 units/ml aPTT 66-102 seconds Monitor platelets by anticoagulation protocol: Yes   Plan:  Increase heparin IV to 2300 units/hr F/u 8h aPTT Monitor a daily aPTT, heparin level, and CBC Watch for signs and symptoms of bleeding  Joetta Manners, PharmD, [facility] Emergency Medicine Clinical Pharmacist ED RPh Phone: [place]: [phone]

## 2020-11-10 NOTE — Plan of Care (Signed)
  Problem: Clinical Measurements: Goal: Ability to maintain clinical measurements within normal limits will improve Outcome: Progressing Goal: Will remain free from infection Outcome: Progressing Goal: Diagnostic test results will improve Outcome: Progressing Goal: Respiratory complications will improve Outcome: Progressing Goal: Cardiovascular complication will be avoided Outcome: Progressing   Problem: Pain Managment: Goal: General experience of comfort will improve Outcome: Progressing   Problem: Safety: Goal: Ability to remain free from injury will improve Outcome: Progressing   Problem: Skin Integrity: Goal: Risk for impaired skin integrity will decrease Outcome: Progressing   

## 2020-11-10 NOTE — Progress Notes (Signed)
Per MD, keep insulin pump on hold until BGM is above 140. Patient started on D5 1/2 NS.

## 2020-11-10 NOTE — Progress Notes (Signed)
ANTICOAGULATION CONSULT NOTE - Follow Consult  Pharmacy Consult for Heparin Indication: atrial fibrillation and history of DVT  Allergies  Allergen Reactions   Prednisone Anaphylaxis and Shortness Of Breath   Dulaglutide Nausea Only   Atorvastatin    Lovastatin     Other reaction(s): OTHER REACTION    Patient Measurements: Height: '5\' 10"'$  (177.8 cm) Weight: 117.9 kg (259 lb 14.8 oz) IBW/kg (Calculated) : 73 kg Heparin Dosing Weight: 99.2 kg  Vital Signs: Temp: 98.6 F (37 C) (09/03 2117) Temp Source: Oral (09/03 2117) BP: 154/86 (09/03 2117) Pulse Rate: 76 (09/03 2117)  Labs: Recent Labs    11/08/20 0232 11/09/20 0159 11/09/20 1831 11/10/20 0517  HGB 11.2* 10.8*  --  11.2*  HCT 35.2* 33.4*  --  34.2*  PLT 304 306  --  273  APTT  --   --  31 27  HEPARINUNFRC  --   --   --  0.28*  CREATININE 2.37* 2.32*  --  2.18*     Estimated Creatinine Clearance: 34.8 mL/min (A) (by C-G formula based on SCr of 2.18 mg/dL (H)).   Assessment: 81 yo male presents with possible osteomyelitis. PTA the patient is on apixaban for atrial fibrillation (last dose 9/1 @ 0543). The patient also reports a history of DVT that was >10 years ago and was treated with warfarin. The patient is planned for LLE amputation on 9/6. Apixaban will be held until post procedure. Pharmacy is consulted to dose heparin.  Will monitor therapy with aPTTs due to the recent apixaban administration.   Heparin level 0.28 (affected by apixaban), aPTT 27 sec (subtherapeutic) on gtt at 1700 units/hr. No issues with line or bleeding reported per RN.  Goal of Therapy:  Heparin level 0.3-0.7 units/ml aPTT 66-102 seconds Monitor platelets by anticoagulation protocol: Yes   Plan:  Increase heparin IV to 2100 units/hr F/u PTT in 8 hours  Sherlon Handing, PharmD, BCPS Please see [email or web address] for complete clinical pharmacist phone list 11/10/2020 6:16 AM

## 2020-11-10 NOTE — Progress Notes (Signed)
Patient's insulin pump accidentally came out.  Patient's Blood sugar noted to be 44.  Two Glucose tablet  was given and patient drank apple juice. Blood sugar was check again and recorded as  65. Patient requested family to bring supply of insulin pump from home. MD on-call was notified. Patient  received his insulin supply from family.Blood sugar was rechecked- 161. RN will continue to monitor.

## 2020-11-11 ENCOUNTER — Inpatient Hospital Stay (HOSPITAL_COMMUNITY): Payer: Medicare Other

## 2020-11-11 DIAGNOSIS — N179 Acute kidney failure, unspecified: Secondary | ICD-10-CM | POA: Diagnosis not present

## 2020-11-11 DIAGNOSIS — E1152 Type 2 diabetes mellitus with diabetic peripheral angiopathy with gangrene: Secondary | ICD-10-CM | POA: Diagnosis not present

## 2020-11-11 DIAGNOSIS — E11628 Type 2 diabetes mellitus with other skin complications: Secondary | ICD-10-CM | POA: Diagnosis not present

## 2020-11-11 DIAGNOSIS — N183 Chronic kidney disease, stage 3 unspecified: Secondary | ICD-10-CM | POA: Diagnosis not present

## 2020-11-11 DIAGNOSIS — E1122 Type 2 diabetes mellitus with diabetic chronic kidney disease: Secondary | ICD-10-CM | POA: Diagnosis not present

## 2020-11-11 DIAGNOSIS — E11621 Type 2 diabetes mellitus with foot ulcer: Secondary | ICD-10-CM | POA: Diagnosis not present

## 2020-11-11 LAB — GLUCOSE, CAPILLARY
Glucose-Capillary: 167 mg/dL — ABNORMAL HIGH (ref 70–99)
Glucose-Capillary: 163 mg/dL — ABNORMAL HIGH (ref 70–99)
Glucose-Capillary: 53 mg/dL — ABNORMAL LOW (ref 70–99)
Glucose-Capillary: 75 mg/dL (ref 70–99)

## 2020-11-11 LAB — CBC
HCT: 34.1 % — ABNORMAL LOW (ref 39.0–52.0)
HCT: 37.1 % — ABNORMAL LOW (ref 39.0–52.0)
Hemoglobin: 11 g/dL — ABNORMAL LOW (ref 13.0–17.0)
Hemoglobin: 12 g/dL — ABNORMAL LOW (ref 13.0–17.0)
MCH: 29.8 pg (ref 26.0–34.0)
MCH: 30.5 pg (ref 26.0–34.0)
MCHC: 32.3 g/dL (ref 30.0–36.0)
MCHC: 32.3 g/dL (ref 30.0–36.0)
MCV: 92.4 fL (ref 80.0–100.0)
MCV: 94.2 fL (ref 80.0–100.0)
Platelets: 320 10*3/uL (ref 150–400)
Platelets: 297 10*3/uL (ref 150–400)
RBC: 3.69 MIL/uL — ABNORMAL LOW (ref 4.22–5.81)
RBC: 3.94 MIL/uL — ABNORMAL LOW (ref 4.22–5.81)
RDW: 14 % (ref 11.5–15.5)
RDW: 14.1 % (ref 11.5–15.5)
WBC: 20.1 10*3/uL — ABNORMAL HIGH (ref 4.0–10.5)
WBC: 19.5 10*3/uL — ABNORMAL HIGH (ref 4.0–10.5)
nRBC: 0 % (ref 0.0–0.2)
nRBC: 0 % (ref 0.0–0.2)

## 2020-11-11 LAB — BASIC METABOLIC PANEL
Anion gap: 11 (ref 5–15)
BUN: 47 mg/dL — ABNORMAL HIGH (ref 8–23)
CO2: 20 mmol/L — ABNORMAL LOW (ref 22–32)
Calcium: 8.8 mg/dL — ABNORMAL LOW (ref 8.9–10.3)
Chloride: 104 mmol/L (ref 98–111)
Creatinine, Ser: 1.85 mg/dL — ABNORMAL HIGH (ref 0.61–1.24)
GFR, Estimated: 36 mL/min — ABNORMAL LOW (ref >60)
Glucose, Bld: 139 mg/dL — ABNORMAL HIGH (ref 70–99)
Potassium: 4.9 mmol/L (ref 3.5–5.1)
Sodium: 135 mmol/L (ref 135–145)

## 2020-11-11 LAB — HEPARIN LEVEL (UNFRACTIONATED)
Heparin Unfractionated: 0.19 IU/mL — ABNORMAL LOW (ref 0.30–0.70)
Heparin Unfractionated: 0.28 IU/mL — ABNORMAL LOW (ref 0.30–0.70)

## 2020-11-11 LAB — CULTURE, BLOOD (ROUTINE X 2)
Culture: NO GROWTH
Culture: NO GROWTH
Special Requests: ADEQUATE

## 2020-11-11 LAB — APTT: aPTT: 53 seconds — ABNORMAL HIGH (ref 24–36)

## 2020-11-11 MED ORDER — HEPARIN (PORCINE) 25000 UT/250ML-% IV SOLN
2800.0000 [IU]/h | INTRAVENOUS | Status: DC
  Administered 2020-11-11 (×2): 2650 [IU]/h via INTRAVENOUS
  Filled 2020-11-11 (×3): qty 250

## 2020-11-11 MED ORDER — DEXTROSE-NACL 5-0.45 % IV SOLN: INTRAVENOUS | Status: DC

## 2020-11-11 MED ORDER — HEPARIN (PORCINE) 25000 UT/250ML-% IV SOLN
2300.0000 [IU]/h | INTRAVENOUS | Status: DC
  Filled 2020-11-11: qty 250

## 2020-11-11 NOTE — H&P (View-Only) (Signed)
   VASCULAR SURGERY ASSESSMENT & PLAN:   PAD WITH NONHEALING WOUND LEFT FOOT: Patient is scheduled for a left below the knee amputation tomorrow.  I have written to stop his heparin at 5 AM as he is scheduled for surgery at 7:30 AM.  I have discussed the indications for the procedure with the patient and the potential complications and he is agreeable to proceed.   SUBJECTIVE:   No specific complaints this morning.  PHYSICAL EXAM:   Vitals:   11/10/20 1416 11/10/20 1900 11/10/20 2218 11/11/20 0634  BP: (!) 135/112  (!) 176/73 (!) 159/63  Pulse: 78  73 63  Resp: 18  16   Temp: 98.5 F (36.9 C)  98.2 F (36.8 C) 97.8 F (36.6 C)  TempSrc: Oral  Oral Oral  SpO2: (!) 86%     Weight:  117.9 kg    Height:  '5\' 10"'$  (1.778 m)     Dressing on the left foot is dry.  LABS:   Lab Results  Component Value Date   WBC 20.1 (H) 11/11/2020   HGB 11.0 (L) 11/11/2020   HCT 34.1 (L) 11/11/2020   MCV 92.4 11/11/2020   PLT 320 11/11/2020   Lab Results  Component Value Date   CREATININE 1.85 (H) 11/11/2020    CBG (last 3)  Recent Labs    11/10/20 2114 11/11/20 0238 11/11/20 0628  GLUCAP 135* 167* 163*    PROBLEM LIST:    Principal Problem:   Diabetic infection of left foot ([place]) Active Problems:   Diabetes mellitus ([place])   CKD (chronic kidney disease) stage 3, GFR 30-59 ml/min (HCC)   Hx of right BKA (HCC)   AKI (acute kidney injury) ([place])   CURRENT MEDS:    (feeding supplement) PROSource Plus  30 mL Oral BID BM   allopurinol  100 mg Oral Daily   amLODipine  5 mg Oral Daily   cholecalciferol  5,000 Units Oral Daily   insulin pump   Subcutaneous TID WC, HS, 0200   metoprolol tartrate  25 mg Oral BID   metroNIDAZOLE  500 mg Oral Q12H   pravastatin  40 mg Oral q1800    Deitra Mayo Office: [phone] 11/11/2020

## 2020-11-11 NOTE — Plan of Care (Signed)

## 2020-11-11 NOTE — Progress Notes (Signed)
ANTICOAGULATION CONSULT NOTE - Follow Up Consult  Pharmacy Consult for Heparin Indication: atrial fibrillation and history of DVT  Allergies  Allergen Reactions   Prednisone Anaphylaxis and Shortness Of Breath   Dulaglutide Nausea Only   Atorvastatin    Lovastatin     Other reaction(s): OTHER REACTION    Patient Measurements: Height: '5\' 10"'$  (177.8 cm) Weight: 117.9 kg (259 lb 14.8 oz) IBW/kg (Calculated) : 73 kg Heparin Dosing Weight: 99.2 kg  Vital Signs: Temp: 97.8 F (36.6 C) (09/05 0634) Temp Source: Oral (09/05 0634) BP: 159/63 (09/05 0634) Pulse Rate: 63 (09/05 0634)  Labs: Recent Labs    11/09/20 0159 11/09/20 1831 11/10/20 0517 11/10/20 1439 11/11/20 0203 11/11/20 0649  HGB 10.8*  --  11.2*  --   --  11.0*  HCT 33.4*  --  34.2*  --   --  34.1*  PLT 306  --  273  --   --  320  APTT  --  31 27 38*  --   --   HEPARINUNFRC  --   --  0.28*  --   --   --   CREATININE 2.32*  --  2.18*  --  1.85*  --      Estimated Creatinine Clearance: 41 mL/min (A) (by C-G formula based on SCr of 1.85 mg/dL (H)).   Medical History: Past Medical History:  Diagnosis Date   Arthritis Sept. 2015   Rh. Neck and Upper Back   Arthritis Sept. 2015   Gout-Right Hand  Left knee   Cancer ([place])    8 wks Radiation   Chronic kidney disease    Diabetes mellitus    ED (erectile dysfunction)    Hx of agent Orange exposure    while [place] in [place] during that conflict   Hyperlipidemia    Hypertension    PVD (peripheral vascular disease) ([place])    PVT (paroxysmal ventricular tachycardia) (HCC)    S/P BKA (below knee amputation) [facility])     Assessment: 80 yo male presents with possible osteomyelitis. PTA the patient is on apixaban for atrial fibrillation (last dose 9/1 @ 0543). The patient also reports a history of DVT that was >10 years ago and was treated with warfarin. The patient is planned for LLE amputation on 9/6. Apixaban will be held until post procedure. Pharmacy is  consulted to dose heparin.   The aPTT and heparin level are subtherapeutic at 53 and 0.19, respectively, while running at 2300 units/hr. The heparin level and aPTT seem to be correlating now, will monitor with heparin levels only.  Per the RN, there were no issues with the infusion and the patient is without signs or symptoms of bleeding. Hgb 11, platelets 320. Per vascular, the heparin infusion will be stopped on 9/6 at 0500, stop date/time placed.   Goal of Therapy:  Heparin level 0.3-0.7 units/ml aPTT 66-102 seconds Monitor platelets by anticoagulation protocol: Yes   Plan:  Increase heparin IV to 2650 units/hr (no bolus) Check an 8-hour heparin level Monitor a daily heparin level and CBC Watch for signs and symptoms of bleeding Follow post-procedure anticoagulation plans   Shauna Hugh, PharmD, [place]  PGY-2 Pharmacy Resident 11/11/2020 7:48 AM  Please check [email or web address] for unit-specific pharmacy phone numbers.

## 2020-11-11 NOTE — Progress Notes (Signed)
   VASCULAR SURGERY ASSESSMENT & PLAN:   PAD WITH NONHEALING WOUND LEFT FOOT: Patient is scheduled for a left below the knee amputation tomorrow.  I have written to stop his heparin at 5 AM as he is scheduled for surgery at 7:30 AM.  I have discussed the indications for the procedure with the patient and the potential complications and he is agreeable to proceed.   SUBJECTIVE:   No specific complaints this morning.  PHYSICAL EXAM:   Vitals:   11/10/20 1416 11/10/20 1900 11/10/20 2218 11/11/20 0634  BP: (!) 135/112  (!) 176/73 (!) 159/63  Pulse: 78  73 63  Resp: 18  16   Temp: 98.5 F (36.9 C)  98.2 F (36.8 C) 97.8 F (36.6 C)  TempSrc: Oral  Oral Oral  SpO2: (!) 86%     Weight:  117.9 kg    Height:  '5\' 10"'$  (1.778 m)     Dressing on the left foot is dry.  LABS:   Lab Results  Component Value Date   WBC 20.1 (H) 11/11/2020   HGB 11.0 (L) 11/11/2020   HCT 34.1 (L) 11/11/2020   MCV 92.4 11/11/2020   PLT 320 11/11/2020   Lab Results  Component Value Date   CREATININE 1.85 (H) 11/11/2020    CBG (last 3)  Recent Labs    11/10/20 2114 11/11/20 0238 11/11/20 0628  GLUCAP 135* 167* 163*    PROBLEM LIST:    Principal Problem:   Diabetic infection of left foot ([place]) Active Problems:   Diabetes mellitus ([place])   CKD (chronic kidney disease) stage 3, GFR 30-59 ml/min (H[facility])   Hx of right BKA (HCC)   AKI (acute kidney injury) ([place])   CURRENT MEDS:    (feeding supplement) PROSource Plus  30 mL Oral BID BM   allopurinol  100 mg Oral Daily   amLODipine  5 mg Oral Daily   cholecalciferol  5,000 Units Oral Daily   insulin pump   Subcutaneous TID WC, HS, 0200   metoprolol tartrate  25 mg Oral BID   metroNIDAZOLE  500 mg Oral Q12H   pravastatin  40 mg Oral q1800    Deitra Mayo Office: [phone] 11/11/2020

## 2020-11-11 NOTE — Progress Notes (Signed)
Pharmacy:  Heparin infusion rate increased this morning and follow up heparin level was due ~5pm.  VP reports that patient has refused blood draws multiple times tonight.  No additional attempts planned.  Heparin drip is to be turned off at 5am on 9/6 for OR at 7:30am.    Will follow up post-op for anticoagulation plans.  Arty Baumgartner, [place] 11/11/2020 8:46 PM

## 2020-11-11 NOTE — TOC Progression Note (Addendum)
Transition of Care [facility]) - Progression Note    Patient Details  Name: Patrick Shelton MRN: [id number] Date of Birth: 09/27/1940  Transition of Care [facility]) CM/SW [place], RN Phone [phone]  11/11/2020, 10:16 AM  Clinical Narrative:    CM received message inquiring about lift chair for patient. Patient and wife are requesting lift chair prior to surgery. CM has explained that there is a process for obtaining lift chair and the patient will not have life chair available prior to surgery tomorrow. CM followed up with [employer] and per [employer] the chair is not covered by insurance,  this is a private pay item. This item has to be purchased at the retail store and the store is closed so CM is unable to obtain estimate price for the patient and family.   19 CM spoke with daughter and she has questions about any positioning needs and will he even be able to sit in lift chair after surgery. CM referred daughter to nursing for questions or concerns related to care and positioning post surgery.         Expected Discharge Plan and Services                                                 Social Determinants of Health (SDOH) Interventions    Readmission Risk Interventions No flowsheet data found.

## 2020-11-11 NOTE — Progress Notes (Signed)
[facility] PROGRESS NOTE   Patrick Shelton [id number] DOB: 1940/09/26 DOA: 10/30/2020  PCP: Lorne Skeens, MD  Brief History/Interval Summary: 80 y.o. male with medical history significant of DM2, HTN, HLD, PAD.  Pt is s/p BKA of RLE previously. 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  Not improved despite wound care and multiple ABx courses as outpt. Pt sent to ED due to concern for osteomyelitis and possible bacteremia.  Apparently had subjective fevers at home.  X-ray of the foot is unchanged from films done few weeks ago.  Patient was started on IV antibiotics.  Hospitalize for further management.  Consultants: Vascular surgery  Procedures: None yet  Antibiotics: Anti-infectives (From admission, onward)    Start     Dose/Rate Route Frequency Ordered Stop   11/09/20 0600  vancomycin (VANCOREADY) IVPB 1500 mg/300 mL        1,500 mg 150 mL/hr over 120 Minutes Intravenous Every 48 hours 11/07/20 0147     11/07/20 1000  cefTRIAXone (ROCEPHIN) 2 g in sodium chloride 0.9 % 100 mL IVPB        2 g 200 mL/hr over 30 Minutes Intravenous Every 24 hours 11/07/20 0136 11/14/20 0959   11/07/20 1000  metroNIDAZOLE (FLAGYL) tablet 500 mg        500 mg Oral Every 12 hours 11/07/20 0136 11/14/20 0959   11/07/20 0200  vancomycin (VANCOREADY) IVPB 2000 mg/400 mL        2,000 mg 200 mL/hr over 120 Minutes Intravenous  Once 11/07/20 0147 11/07/20 0636   11/07/20 0045  piperacillin-tazobactam (ZOSYN) IVPB 3.375 g        3.375 g 100 mL/hr over 30 Minutes Intravenous  Once 11/07/20 0039 11/07/20 0204       Subjective/Interval History: Patient did not have as many low glucose levels as the day before.  Complains of cough which is chronic in which he usually gets early in the morning.  Pain in the left foot is reasonably well controlled.    Assessment/Plan:  Gangrenous left fourth toe in the  setting of diabetes/peripheral artery disease Patient followed by vascular surgeon, Dr. Scot Dock.  Last seen in July.  Arteriogram was offered but patient declined.   Vascular surgery was consulted.  Patient not interested in angiogram due to risk of progression of her kidney disease.  Eliquis remains on hold.     No further episodes of fever noted.  Remains on vancomycin and ceftriaxone and metronidazole.  WBC remains elevated.  Cultures have been negative. Plan is for below-knee amputation tomorrow.  Heparin will be stopped in the morning prior to surgery.  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  Acute failure likely secondary to infection and perhaps a component of hypovolemia.  Holding ARB and HCTZ.   Patient was started on IV fluids.  Renal function continues to improve.  Monitor urine output.  Avoid nephrotoxic agents.      Diabetes mellitus, insulin-dependent Patient is on an insulin pump.  He has signed the insulin pump contract.   Patient has been seen by diabetes coordinator.   Due to frequent episodes of hypoglycemia which was thought to be due to poor oral intake patient was started on IV fluids with dextrose.  CBGs appear to be better controlled.  Patient was told to stay on the pump.  We will plan to increase his  IV fluid rate tonight when he becomes NPO.   Per vascular surgeon his surgery should not take more than 1 hour.  In view of this may be reasonable to just leave him on the pump.  CBGs can be monitored in the OR.  Normocytic anemia Likely due to chronic disease.  No evidence of overt bleeding.  Slight drop in hemoglobin is dilutional.  Essential hypertension Continue amlodipine.  Permanent atrial fibrillation Not noted to be on any rate limiting drugs.  Eliquis is on hold.  Currently on IV heparin.  Pharmacy is aware that heparin will need to be discontinued before surgery tomorrow  morning.  Morbid obesity Estimated body mass index is 37.29 kg/m as calculated from the following:   Height as of this encounter: '5\' 10"'$  (1.778 m).   Weight as of this encounter: 117.9 kg.   DVT Prophylaxis: Eliquis currently on hold.  IV heparin.   Code Status: Full code Family Communication: Discussed with patient Disposition Plan: To be determined  Status is: Inpatient  Remains inpatient appropriate because:Ongoing diagnostic testing needed not appropriate for outpatient work up, IV treatments appropriate due to intensity of illness or inability to take PO, and Inpatient level of care appropriate due to severity of illness  Dispo: The patient is from: Home              Anticipated d/c is to: Home              Patient currently is not medically stable to d/c.   Difficult to place patient No       Medications: Scheduled:  (feeding supplement) PROSource Plus  30 mL Oral BID BM   allopurinol  100 mg Oral Daily   amLODipine  5 mg Oral Daily   cholecalciferol  5,000 Units Oral Daily   insulin pump   Subcutaneous TID WC, HS, 0200   metoprolol tartrate  25 mg Oral BID   metroNIDAZOLE  500 mg Oral Q12H   pravastatin  40 mg Oral q1800   Continuous:  cefTRIAXone (ROCEPHIN)  IV 2 g (11/10/20 0832)   dextrose 5 % and 0.45% NaCl 75 mL/hr at 11/10/20 2344   heparin 2,650 Units/hr (11/11/20 0902)   vancomycin 1,500 mg (11/11/20 [id number])   [id number] **OR** acetaminophen, HYDROmorphone (DILAUDID) injection, ondansetron **OR** ondansetron (ZOFRAN) IV, oxyCODONE, white petrolatum   Objective:  Vital Signs  Vitals:   11/10/20 1900 11/10/20 2218 11/11/20 0634 11/11/20 0854  BP:  (!) 176/73 (!) 159/63 (!) 157/69  Pulse:  73 63 65  Resp:  16  17  Temp:  98.2 F (36.8 C) 97.8 F (36.6 C) 97.7 F (36.5 C)  TempSrc:  Oral Oral Oral  SpO2:    91%  Weight: 117.9 kg     Height: '5\' 10"'$  (1.778 m)       Intake/Output Summary (Last 24 hours) at 11/11/2020 1004 Last data filed  at 11/11/2020 0100 Gross per 24 hour  Intake 456.28 ml  Output 300 ml  Net 156.28 ml    Filed Weights   10/12/2020 1954 11/10/20 1900  Weight: 117.9 kg 117.9 kg    General appearance: Awake alert.  In no distress Resp: Diminished air entry at the bases.  Not tachypneic.  No wheezing or rhonchi appreciated. Cardio: S1-S2 is normal regular.  No S3-S4.  No rubs murmurs or bruit GI: Abdomen is soft.  Nontender nondistended.  Bowel sounds are present normal.  No masses organomegaly Extremities: Status post right BKA previously.  Gangrenous  left fourth toe. Neurologic: Alert and oriented x3.  No focal neurological deficits.       Lab Results:  Data Reviewed: I have personally reviewed following labs and imaging studies  CBC: Recent Labs  Lab 11/05/2020 2031 11/07/20 0414 11/08/20 0232 11/09/20 0159 11/10/20 0517 11/11/20 0649  WBC 20.0* 16.8* 21.0* 26.2* 20.7* 20.1*  NEUTROABS 14.8*  --   --   --   --   --   HGB 12.3* 12.4* 11.2* 10.8* 11.2* 11.0*  HCT 37.0* 39.1 35.2* 33.4* 34.2* 34.1*  MCV 92.5 94.4 93.1 92.5 92.4 92.4  PLT 280 271 304 306 273 320     Basic Metabolic Panel: Recent Labs  Lab 11/07/20 0414 11/08/20 0232 11/09/20 0159 11/10/20 0517 11/11/20 0203  NA 134* 135 135 135 135  K 4.0 4.1 4.3 4.4 4.9  CL 101 99 103 103 104  CO2 '23 29 25 24 '$ 20*  GLUCOSE 129* 75 57* 112* 139*  BUN 51* 46* 51* 52* 47*  CREATININE 2.19* 2.37* 2.32* 2.18* 1.85*  CALCIUM 8.6* 8.8* 8.6* 8.6* 8.8*     GFR: Estimated Creatinine Clearance: 41 mL/min (A) (by C-G formula based on SCr of 1.85 mg/dL (H)).  Liver Function Tests: Recent Labs  Lab 10/13/2020 2031  AST 31  ALT 31  ALKPHOS 74  BILITOT 0.2*  PROT 7.6  ALBUMIN 2.6*      CBG: Recent Labs  Lab 11/10/20 1428 11/10/20 1703 11/10/20 2114 11/11/20 0238 11/11/20 0628  GLUCAP 108* 240* 135* 167* 163*       Recent Results (from the past 240 hour(s))  Resp Panel by RT-PCR (Flu A&B, Covid) Nasopharyngeal Swab      Status: None   Collection Time: 10/09/2020  7:59 PM   Specimen: Nasopharyngeal Swab; Nasopharyngeal(NP) swabs in vial transport medium  Result Value Ref Range Status   SARS Coronavirus 2 by RT PCR NEGATIVE NEGATIVE Final    Comment: (NOTE) SARS-CoV-2 target nucleic acids are NOT DETECTED.  The SARS-CoV-2 RNA is generally detectable in upper respiratory specimens during the acute phase of infection. The lowest concentration of SARS-CoV-2 viral copies this assay can detect is 138 copies/mL. A negative result does not preclude SARS-Cov-2 infection and should not be used as the sole basis for treatment or other patient management decisions. A negative result may occur with  improper specimen collection/handling, submission of specimen other than nasopharyngeal swab, presence of viral mutation(s) within the areas targeted by this assay, and inadequate number of viral copies(<138 copies/mL). A negative result must be combined with clinical observations, patient history, and epidemiological information. The expected result is Negative.  Fact Sheet for Patients:  EntrepreneurPulse.com.au  Fact Sheet for Healthcare Providers:  IncredibleEmployment.be  This test is no t yet approved or cleared by the [place] FDA and  has been authorized for detection and/or diagnosis of SARS-CoV-2 by FDA under an Emergency Use Authorization (EUA). This EUA will remain  in effect (meaning this test can be used) for the duration of the COVID-19 declaration under Section 564(b)(1) of the Act, 21 U.S.C.section 360bbb-3(b)(1), unless the authorization is terminated  or revoked sooner.       Influenza A by PCR NEGATIVE NEGATIVE Final   Influenza B by PCR NEGATIVE NEGATIVE Final    Comment: (NOTE) The Xpert Xpress SARS-CoV-2/FLU/RSV plus assay is intended as an aid in the diagnosis of influenza from Nasopharyngeal swab specimens and should not be used as a sole  basis for treatment. Nasal washings and aspirates are  unacceptable for Xpert Xpress SARS-CoV-2/FLU/RSV testing.  Fact Sheet for Patients: EntrepreneurPulse.com.au  Fact Sheet for Healthcare Providers: IncredibleEmployment.be  This test is not yet approved or cleared by the [place] FDA and has been authorized for detection and/or diagnosis of SARS-CoV-2 by FDA under an Emergency Use Authorization (EUA). This EUA will remain in effect (meaning this test can be used) for the duration of the COVID-19 declaration under Section 564(b)(1) of the Act, 21 U.S.C. section 360bbb-3(b)(1), unless the authorization is terminated or revoked.  Performed at [facility], [place] [address] [place], [place] [address]   Blood culture (routine x 2)     Status: None   Collection Time: 10/08/2020  8:15 PM   Specimen: BLOOD RIGHT HAND  Result Value Ref Range Status   Specimen Description BLOOD RIGHT HAND  Final   Special Requests   Final    BOTTLES DRAWN AEROBIC AND ANAEROBIC Blood Culture results may not be optimal due to an excessive volume of blood received in culture bottles   Culture   Final    NO GROWTH 5 DAYS Performed at [facility], [place] [address] [place], [place] [address]    Report Status 11/11/2020 FINAL  Final  Blood culture (routine x 2)     Status: None   Collection Time: 10/14/2020  8:32 PM   Specimen: BLOOD LEFT ARM  Result Value Ref Range Status   Specimen Description BLOOD LEFT ARM  Final   Special Requests   Final    BOTTLES DRAWN AEROBIC ONLY Blood Culture adequate volume   Culture   Final    NO GROWTH 5 DAYS Performed at [facility], [place] [address] [place], [place] [address]    Report Status 11/11/2020 FINAL  Final  MRSA Next Gen by PCR, Nasal     Status: None   Collection Time: 11/07/20  3:29 AM   Specimen: Nasal Mucosa; Nasal Swab  Result Value Ref Range Status   MRSA by PCR Next Gen NOT DETECTED NOT DETECTED  Final    Comment: (NOTE) The GeneXpert MRSA Assay (FDA approved for NASAL specimens only), is one component of a comprehensive MRSA colonization surveillance program. It is not intended to diagnose MRSA infection nor to guide or monitor treatment for MRSA infections. Test performance is not FDA approved in patients less than 56 years old. Performed at [facility], [place] [address], [place], [place] [address]        Radiology Studies: No results found.     LOS: 4 days   [employer] on [email or web address]  11/11/2020, 10:04 AM

## 2020-11-12 ENCOUNTER — Inpatient Hospital Stay (HOSPITAL_COMMUNITY): Payer: Medicare Other | Admitting: Anesthesiology

## 2020-11-12 ENCOUNTER — Inpatient Hospital Stay (HOSPITAL_COMMUNITY): Payer: Medicare Other

## 2020-11-12 ENCOUNTER — Encounter (HOSPITAL_COMMUNITY): Admission: EM | Disposition: E | Payer: Self-pay | Source: Ambulatory Visit | Attending: Internal Medicine

## 2020-11-12 DIAGNOSIS — N179 Acute kidney failure, unspecified: Secondary | ICD-10-CM | POA: Diagnosis not present

## 2020-11-12 DIAGNOSIS — E11628 Type 2 diabetes mellitus with other skin complications: Secondary | ICD-10-CM | POA: Diagnosis not present

## 2020-11-12 DIAGNOSIS — E1152 Type 2 diabetes mellitus with diabetic peripheral angiopathy with gangrene: Secondary | ICD-10-CM | POA: Diagnosis not present

## 2020-11-12 DIAGNOSIS — I70262 Atherosclerosis of native arteries of extremities with gangrene, left leg: Secondary | ICD-10-CM

## 2020-11-12 DIAGNOSIS — N183 Chronic kidney disease, stage 3 unspecified: Secondary | ICD-10-CM | POA: Diagnosis not present

## 2020-11-12 HISTORY — PX: AMPUTATION: SHX166

## 2020-11-12 LAB — BASIC METABOLIC PANEL
Anion gap: 10 (ref 5–15)
BUN: 39 mg/dL — ABNORMAL HIGH (ref 8–23)
CO2: 22 mmol/L (ref 22–32)
Calcium: 8.7 mg/dL — ABNORMAL LOW (ref 8.9–10.3)
Chloride: 103 mmol/L (ref 98–111)
Creatinine, Ser: 1.77 mg/dL — ABNORMAL HIGH (ref 0.61–1.24)
GFR, Estimated: 38 mL/min — ABNORMAL LOW (ref >60)
Glucose, Bld: 387 mg/dL — ABNORMAL HIGH (ref 70–99)
Potassium: 5.9 mmol/L — ABNORMAL HIGH (ref 3.5–5.1)
Sodium: 135 mmol/L (ref 135–145)

## 2020-11-12 LAB — BLOOD GAS, ARTERIAL
Acid-base deficit: 4.7 mmol/L — ABNORMAL HIGH (ref 0.0–2.0)
Bicarbonate: 22 mmol/L (ref 20.0–28.0)
Drawn by: 548791
FIO2: 32
O2 Saturation: 94.2 %
Patient temperature: 37
pCO2 arterial: 57.4 mmHg — ABNORMAL HIGH (ref 32.0–48.0)
pH, Arterial: 7.209 — ABNORMAL LOW (ref 7.350–7.450)
pO2, Arterial: 81 mmHg — ABNORMAL LOW (ref 83.0–108.0)

## 2020-11-12 LAB — GLUCOSE, CAPILLARY
Glucose-Capillary: 73 mg/dL (ref 70–99)
Glucose-Capillary: 171 mg/dL — ABNORMAL HIGH (ref 70–99)
Glucose-Capillary: 84 mg/dL (ref 70–99)
Glucose-Capillary: 60 mg/dL — ABNORMAL LOW (ref 70–99)
Glucose-Capillary: 101 mg/dL — ABNORMAL HIGH (ref 70–99)
Glucose-Capillary: 100 mg/dL — ABNORMAL HIGH (ref 70–99)
Glucose-Capillary: 365 mg/dL — ABNORMAL HIGH (ref 70–99)
Glucose-Capillary: 349 mg/dL — ABNORMAL HIGH (ref 70–99)
Glucose-Capillary: 304 mg/dL — ABNORMAL HIGH (ref 70–99)

## 2020-11-12 LAB — TROPONIN I (HIGH SENSITIVITY): Troponin I (High Sensitivity): 20 ng/L — ABNORMAL HIGH (ref <18)

## 2020-11-12 LAB — BRAIN NATRIURETIC PEPTIDE: B Natriuretic Peptide: 392.6 pg/mL — ABNORMAL HIGH (ref 0.0–100.0)

## 2020-11-12 LAB — BETA-HYDROXYBUTYRIC ACID: Beta-Hydroxybutyric Acid: 0.38 mmol/L — ABNORMAL HIGH (ref 0.05–0.27)

## 2020-11-12 SURGERY — AMPUTATION BELOW KNEE
Anesthesia: Regional | Site: Knee | Laterality: Left

## 2020-11-12 MED ORDER — MAGNESIUM SULFATE 2 GM/50ML IV SOLN
2.0000 g | Freq: Once | INTRAVENOUS | Status: DC | PRN
  Filled 2020-11-12 (×2): qty 50

## 2020-11-12 MED ORDER — EPHEDRINE 5 MG/ML INJ
INTRAVENOUS | Status: AC
  Filled 2020-11-12: qty 5

## 2020-11-12 MED ORDER — DEXTROSE 50 % IV SOLN
INTRAVENOUS | Status: DC | PRN
  Administered 2020-11-12 (×2): 25 mL via INTRAVENOUS

## 2020-11-12 MED ORDER — ONDANSETRON HCL 4 MG/2ML IJ SOLN
INTRAMUSCULAR | Status: DC | PRN
  Administered 2020-11-12: 4 mg via INTRAVENOUS

## 2020-11-12 MED ORDER — LIDOCAINE HCL (CARDIAC) PF 100 MG/5ML IV SOSY
PREFILLED_SYRINGE | INTRAVENOUS | Status: DC | PRN
  Administered 2020-11-12: 40 mg via INTRAVENOUS

## 2020-11-12 MED ORDER — OXYCODONE HCL 5 MG PO TABS
10.0000 mg | ORAL_TABLET | ORAL | Status: DC | PRN
  Administered 2020-11-12: 10 mg via ORAL
  Filled 2020-11-12: qty 2

## 2020-11-12 MED ORDER — PROSOURCE PLUS PO LIQD
30.0000 mL | Freq: Three times a day (TID) | ORAL | Status: DC
  Administered 2020-11-13 – 2020-11-14 (×3): 30 mL via ORAL
  Filled 2020-11-12 (×3): qty 30

## 2020-11-12 MED ORDER — BUPIVACAINE LIPOSOME 1.3 % IJ SUSP
INTRAMUSCULAR | Status: DC | PRN
  Administered 2020-11-12: 10 mL via PERINEURAL

## 2020-11-12 MED ORDER — HYDROMORPHONE HCL 1 MG/ML IJ SOLN: 0.5000 mg | INTRAMUSCULAR | Status: DC | PRN

## 2020-11-12 MED ORDER — PHENOL 1.4 % MT LIQD: 1.0000 | OROMUCOSAL | Status: DC | PRN

## 2020-11-12 MED ORDER — OXYCODONE HCL 5 MG PO TABS: 5.0000 mg | ORAL_TABLET | ORAL | Status: DC | PRN

## 2020-11-12 MED ORDER — OXYCODONE HCL 5 MG/5ML PO SOLN
5.0000 mg | Freq: Once | ORAL | Status: DC | PRN
Start: 2020-11-12 — End: 2020-11-12

## 2020-11-12 MED ORDER — FUROSEMIDE 10 MG/ML IJ SOLN
40.0000 mg | Freq: Once | INTRAMUSCULAR | Status: AC
  Administered 2020-11-12: 40 mg via INTRAVENOUS

## 2020-11-12 MED ORDER — ROPIVACAINE HCL 5 MG/ML IJ SOLN
INTRAMUSCULAR | Status: DC | PRN
  Administered 2020-11-12: 30 mL via PERINEURAL

## 2020-11-12 MED ORDER — BACITRACIN ZINC 500 UNIT/GM EX OINT
TOPICAL_OINTMENT | CUTANEOUS | Status: DC | PRN
  Administered 2020-11-12: 1 via TOPICAL

## 2020-11-12 MED ORDER — DEXTROSE 50 % IV SOLN
INTRAVENOUS | Status: AC
  Filled 2020-11-12: qty 50

## 2020-11-12 MED ORDER — SUCCINYLCHOLINE CHLORIDE 200 MG/10ML IV SOSY
PREFILLED_SYRINGE | INTRAVENOUS | Status: DC | PRN
  Administered 2020-11-12: 150 mg via INTRAVENOUS

## 2020-11-12 MED ORDER — FUROSEMIDE 10 MG/ML IJ SOLN
INTRAMUSCULAR | Status: AC
  Filled 2020-11-12: qty 4

## 2020-11-12 MED ORDER — ROCURONIUM BROMIDE 10 MG/ML (PF) SYRINGE
PREFILLED_SYRINGE | INTRAVENOUS | Status: AC
  Filled 2020-11-12: qty 10

## 2020-11-12 MED ORDER — PANTOPRAZOLE SODIUM 40 MG PO TBEC
40.0000 mg | DELAYED_RELEASE_TABLET | Freq: Every day | ORAL | Status: DC
  Administered 2020-11-12 – 2020-11-14 (×3): 40 mg via ORAL
  Filled 2020-11-12 (×2): qty 1
  Filled 2020-11-12: qty 2

## 2020-11-12 MED ORDER — SUGAMMADEX SODIUM 200 MG/2ML IV SOLN
INTRAVENOUS | Status: DC | PRN
  Administered 2020-11-12: 400 mg via INTRAVENOUS

## 2020-11-12 MED ORDER — 0.9 % SODIUM CHLORIDE (POUR BTL) OPTIME
TOPICAL | Status: DC | PRN
Start: 2020-11-12 — End: 2020-11-12
  Administered 2020-11-12: 1000 mL

## 2020-11-12 MED ORDER — PHENYLEPHRINE 40 MCG/ML (10ML) SYRINGE FOR IV PUSH (FOR BLOOD PRESSURE SUPPORT)
PREFILLED_SYRINGE | INTRAVENOUS | Status: AC
  Filled 2020-11-12: qty 10

## 2020-11-12 MED ORDER — IPRATROPIUM-ALBUTEROL 0.5-2.5 (3) MG/3ML IN SOLN
RESPIRATORY_TRACT | Status: AC
  Filled 2020-11-12: qty 3

## 2020-11-12 MED ORDER — FENTANYL CITRATE (PF) 250 MCG/5ML IJ SOLN
INTRAMUSCULAR | Status: AC
  Filled 2020-11-12: qty 5

## 2020-11-12 MED ORDER — EPHEDRINE SULFATE 50 MG/ML IJ SOLN
INTRAMUSCULAR | Status: DC | PRN
  Administered 2020-11-12 (×2): 10 mg via INTRAVENOUS

## 2020-11-12 MED ORDER — LACTATED RINGERS IV SOLN: INTRAVENOUS | Status: DC | PRN

## 2020-11-12 MED ORDER — ACETAMINOPHEN 325 MG PO TABS: 325.0000 mg | ORAL_TABLET | Freq: Four times a day (QID) | ORAL | Status: DC | PRN

## 2020-11-12 MED ORDER — VANCOMYCIN HCL 1250 MG/250ML IV SOLN
1250.0000 mg | INTRAVENOUS | Status: AC
Start: 2020-11-12 — End: 2020-11-13
  Administered 2020-11-12 – 2020-11-13 (×2): 1250 mg via INTRAVENOUS
  Filled 2020-11-12 (×2): qty 250

## 2020-11-12 MED ORDER — FENTANYL CITRATE (PF) 100 MCG/2ML IJ SOLN
INTRAMUSCULAR | Status: DC | PRN
  Administered 2020-11-12: 50 ug via INTRAVENOUS
  Administered 2020-11-12 (×2): 25 ug via INTRAVENOUS

## 2020-11-12 MED ORDER — ONDANSETRON HCL 4 MG/2ML IJ SOLN: 4.0000 mg | Freq: Once | INTRAMUSCULAR | Status: DC | PRN

## 2020-11-12 MED ORDER — METOPROLOL TARTRATE 5 MG/5ML IV SOLN: 2.0000 mg | INTRAVENOUS | Status: DC | PRN

## 2020-11-12 MED ORDER — HYDRALAZINE HCL 20 MG/ML IJ SOLN: 5.0000 mg | INTRAMUSCULAR | Status: DC | PRN

## 2020-11-12 MED ORDER — PHENYLEPHRINE HCL (PRESSORS) 10 MG/ML IV SOLN
INTRAVENOUS | Status: DC | PRN
  Administered 2020-11-12: 100 ug via INTRAVENOUS
  Administered 2020-11-12 (×3): 80 ug via INTRAVENOUS
  Administered 2020-11-12: 120 ug via INTRAVENOUS
  Administered 2020-11-12 (×2): 100 ug via INTRAVENOUS

## 2020-11-12 MED ORDER — PROPOFOL 10 MG/ML IV BOLUS
INTRAVENOUS | Status: DC | PRN
  Administered 2020-11-12: 50 mg via INTRAVENOUS
  Administered 2020-11-12: 100 mg via INTRAVENOUS

## 2020-11-12 MED ORDER — ADULT MULTIVITAMIN W/MINERALS CH
1.0000 | ORAL_TABLET | Freq: Every day | ORAL | Status: DC
  Administered 2020-11-13 – 2020-11-14 (×2): 1 via ORAL
  Filled 2020-11-12 (×2): qty 1

## 2020-11-12 MED ORDER — GUAIFENESIN-DM 100-10 MG/5ML PO SYRP: 15.0000 mL | ORAL_SOLUTION | ORAL | Status: DC | PRN

## 2020-11-12 MED ORDER — ROCURONIUM BROMIDE 100 MG/10ML IV SOLN
INTRAVENOUS | Status: DC | PRN
  Administered 2020-11-12: 50 mg via INTRAVENOUS
  Administered 2020-11-12: 20 mg via INTRAVENOUS

## 2020-11-12 MED ORDER — IPRATROPIUM-ALBUTEROL 0.5-2.5 (3) MG/3ML IN SOLN
3.0000 mL | RESPIRATORY_TRACT | Status: AC
  Filled 2020-11-12: qty 3

## 2020-11-12 MED ORDER — SODIUM CHLORIDE 0.9 % IV SOLN: INTRAVENOUS | Status: DC

## 2020-11-12 MED ORDER — ALUM & MAG HYDROXIDE-SIMETH 200-200-20 MG/5ML PO SUSP: 15.0000 mL | ORAL | Status: DC | PRN

## 2020-11-12 MED ORDER — FENTANYL CITRATE (PF) 100 MCG/2ML IJ SOLN: 25.0000 ug | INTRAMUSCULAR | Status: DC | PRN

## 2020-11-12 MED ORDER — LIDOCAINE 2% (20 MG/ML) 5 ML SYRINGE
INTRAMUSCULAR | Status: AC
  Filled 2020-11-12: qty 5

## 2020-11-12 MED ORDER — NALOXONE HCL 0.4 MG/ML IJ SOLN
INTRAMUSCULAR | Status: DC | PRN
  Administered 2020-11-12 (×2): 40 ug via INTRAVENOUS

## 2020-11-12 MED ORDER — SUCCINYLCHOLINE CHLORIDE 200 MG/10ML IV SOSY
PREFILLED_SYRINGE | INTRAVENOUS | Status: AC
  Filled 2020-11-12: qty 10

## 2020-11-12 MED ORDER — IPRATROPIUM-ALBUTEROL 0.5-2.5 (3) MG/3ML IN SOLN
3.0000 mL | RESPIRATORY_TRACT | Status: DC
  Administered 2020-11-12: 3 mL via RESPIRATORY_TRACT

## 2020-11-12 MED ORDER — INSULIN ASPART 100 UNIT/ML IJ SOLN
6.0000 [IU] | Freq: Once | INTRAMUSCULAR | Status: AC
  Administered 2020-11-12: 6 [IU] via SUBCUTANEOUS

## 2020-11-12 MED ORDER — DOCUSATE SODIUM 100 MG PO CAPS
100.0000 mg | ORAL_CAPSULE | Freq: Every day | ORAL | Status: DC
  Administered 2020-11-13 – 2020-11-14 (×2): 100 mg via ORAL
  Filled 2020-11-12 (×2): qty 1

## 2020-11-12 MED ORDER — ALBUTEROL SULFATE HFA 108 (90 BASE) MCG/ACT IN AERS
INHALATION_SPRAY | RESPIRATORY_TRACT | Status: DC | PRN
  Administered 2020-11-12: 6 via RESPIRATORY_TRACT

## 2020-11-12 MED ORDER — ONDANSETRON HCL 4 MG/2ML IJ SOLN
INTRAMUSCULAR | Status: AC
  Filled 2020-11-12: qty 2

## 2020-11-12 MED ORDER — OXYCODONE HCL 5 MG PO TABS: 5.0000 mg | ORAL_TABLET | Freq: Once | ORAL | Status: DC | PRN

## 2020-11-12 MED ORDER — HEPARIN (PORCINE) 25000 UT/250ML-% IV SOLN
2000.0000 [IU]/h | INTRAVENOUS | Status: AC
  Administered 2020-11-12: 2000 [IU]/h via INTRAVENOUS
  Administered 2020-11-13: 2150 [IU]/h via INTRAVENOUS
  Administered 2020-11-13: 2200 [IU]/h via INTRAVENOUS
  Administered 2020-11-14: 2000 [IU]/h via INTRAVENOUS
  Filled 2020-11-12 (×4): qty 250

## 2020-11-12 MED ORDER — PROPOFOL 10 MG/ML IV BOLUS
INTRAVENOUS | Status: AC
  Filled 2020-11-12: qty 20

## 2020-11-12 MED ORDER — BACITRACIN ZINC 500 UNIT/GM EX OINT
TOPICAL_OINTMENT | CUTANEOUS | Status: AC
  Filled 2020-11-12: qty 28.35

## 2020-11-12 MED ORDER — DEXTROSE 50 % IV SOLN
12.5000 g | INTRAVENOUS | Status: AC
  Administered 2020-11-12: 12.5 g via INTRAVENOUS
  Filled 2020-11-12: qty 50

## 2020-11-12 MED ORDER — LABETALOL HCL 5 MG/ML IV SOLN: 10.0000 mg | INTRAVENOUS | Status: DC | PRN

## 2020-11-12 MED ORDER — ONDANSETRON HCL 4 MG/2ML IJ SOLN: 4.0000 mg | Freq: Four times a day (QID) | INTRAMUSCULAR | Status: DC | PRN

## 2020-11-12 MED ORDER — POTASSIUM CHLORIDE CRYS ER 20 MEQ PO TBCR
20.0000 meq | EXTENDED_RELEASE_TABLET | Freq: Once | ORAL | Status: DC | PRN
Start: 2020-11-12 — End: 2020-11-14

## 2020-11-12 MED ORDER — IPRATROPIUM-ALBUTEROL 0.5-2.5 (3) MG/3ML IN SOLN
3.0000 mL | RESPIRATORY_TRACT | Status: DC | PRN
  Administered 2020-11-14 (×2): 3 mL via RESPIRATORY_TRACT
  Filled 2020-11-12 (×3): qty 3

## 2020-11-12 SURGICAL SUPPLY — 54 items
BAG COUNTER SPONGE SURGICOUNT (BAG) ×2 IMPLANT
BANDAGE ESMARK 6X9 LF (GAUZE/BANDAGES/DRESSINGS) ×1 IMPLANT
BLADE SAW RECIP 87.9 MT (BLADE) ×2 IMPLANT
BNDG COHESIVE 6X5 TAN STRL LF (GAUZE/BANDAGES/DRESSINGS) ×2 IMPLANT
BNDG ELASTIC 4X5.8 VLCR STR LF (GAUZE/BANDAGES/DRESSINGS) ×4 IMPLANT
BNDG ELASTIC 6X10 VLCR STRL LF (GAUZE/BANDAGES/DRESSINGS) ×1 IMPLANT
BNDG ESMARK 6X9 LF (GAUZE/BANDAGES/DRESSINGS) ×2
BNDG GAUZE ELAST 4 BULKY (GAUZE/BANDAGES/DRESSINGS) ×3 IMPLANT
CANISTER SUCT 3000ML PPV (MISCELLANEOUS) ×4 IMPLANT
CLIP VESOCCLUDE MED 6/CT (CLIP) IMPLANT
COVER SURGICAL LIGHT HANDLE (MISCELLANEOUS) ×2 IMPLANT
CUFF TOURN SGL QUICK 24 (TOURNIQUET CUFF)
CUFF TOURN SGL QUICK 34 (TOURNIQUET CUFF)
CUFF TOURN SGL QUICK 42 (TOURNIQUET CUFF) IMPLANT
CUFF TRNQT CYL 24X4X16.5-23 (TOURNIQUET CUFF) IMPLANT
CUFF TRNQT CYL 34X4.125X (TOURNIQUET CUFF) IMPLANT
DRAIN CHANNEL 19F RND (DRAIN) IMPLANT
DRAPE HALF SHEET 40X57 (DRAPES) ×2 IMPLANT
DRAPE INCISE IOBAN 66X45 STRL (DRAPES) ×1 IMPLANT
DRAPE ORTHO SPLIT 77X108 STRL (DRAPES) ×4
DRAPE SURG ORHT 6 SPLT 77X108 (DRAPES) ×2 IMPLANT
DRAPE U-SHAPE 47X51 STRL (DRAPES) ×2 IMPLANT
DRSG ADAPTIC 3X8 NADH LF (GAUZE/BANDAGES/DRESSINGS) ×2 IMPLANT
ELECT REM PT RETURN 9FT ADLT (ELECTROSURGICAL) ×2
ELECTRODE REM PT RTRN 9FT ADLT (ELECTROSURGICAL) ×1 IMPLANT
EVACUATOR SILICONE 100CC (DRAIN) IMPLANT
GAUZE SPONGE 4X4 12PLY STRL (GAUZE/BANDAGES/DRESSINGS) ×3 IMPLANT
GAUZE XEROFORM 5X9 LF (GAUZE/BANDAGES/DRESSINGS) IMPLANT
GLOVE SRG 8 PF TXTR STRL LF DI (GLOVE) ×1 IMPLANT
GLOVE SURG ENC MOIS LTX SZ7.5 (GLOVE) ×2 IMPLANT
GLOVE SURG UNDER POLY LF SZ6.5 (GLOVE) ×1 IMPLANT
GLOVE SURG UNDER POLY LF SZ7 (GLOVE) ×1 IMPLANT
GLOVE SURG UNDER POLY LF SZ8 (GLOVE) ×1
GOWN STRL REUS W/ TWL LRG LVL3 (GOWN DISPOSABLE) ×3 IMPLANT
GOWN STRL REUS W/TWL LRG LVL3 (GOWN DISPOSABLE) ×8
KIT BASIN OR (CUSTOM PROCEDURE TRAY) ×2 IMPLANT
KIT TURNOVER KIT B (KITS) ×2 IMPLANT
NS IRRIG 1000ML POUR BTL (IV SOLUTION) ×2 IMPLANT
PACK GENERAL/GYN (CUSTOM PROCEDURE TRAY) ×2 IMPLANT
PAD ARMBOARD 7.5X6 YLW CONV (MISCELLANEOUS) ×4 IMPLANT
RASP HELIOCORDIAL MED (MISCELLANEOUS) IMPLANT
STAPLER VISISTAT (STAPLE) ×2 IMPLANT
STOCKINETTE IMPERVIOUS LG (DRAPES) ×2 IMPLANT
SUT ETHILON 3 0 PS 1 (SUTURE) IMPLANT
SUT SILK 0 TIES 10X30 (SUTURE) ×2 IMPLANT
SUT SILK 2 0 (SUTURE) ×1
SUT SILK 2 0 SH CR/8 (SUTURE) ×2 IMPLANT
SUT SILK 2-0 18XBRD TIE 12 (SUTURE) ×1 IMPLANT
SUT SILK 3 0 (SUTURE) ×1
SUT SILK 3-0 18XBRD TIE 12 (SUTURE) ×1 IMPLANT
SUT VIC AB 2-0 CT1 18 (SUTURE) ×2 IMPLANT
TOWEL GREEN STERILE (TOWEL DISPOSABLE) ×4 IMPLANT
UNDERPAD 30X36 HEAVY ABSORB (UNDERPADS AND DIAPERS) ×2 IMPLANT
WATER STERILE IRR 1000ML POUR (IV SOLUTION) ×2 IMPLANT

## 2020-11-12 NOTE — Anesthesia Postprocedure Evaluation (Signed)
Anesthesia Post Note  Patient: Patrick Shelton  Procedure(s) Performed: AMPUTATION BELOW KNEE (Left: Knee)     Patient location during evaluation: PACU Anesthesia Type: Regional and General Level of consciousness: awake and alert, oriented and patient cooperative Pain management: pain level controlled Vital Signs Assessment: post-procedure vital signs reviewed and stable Respiratory status: spontaneous breathing, nonlabored ventilation and respiratory function stable Cardiovascular status: blood pressure returned to baseline and stable Postop Assessment: no apparent nausea or vomiting Anesthetic complications: no   No notable events documented.  Last Vitals:  Vitals:   11/25/2020 1120 11/15/2020 1222  BP: (!) 166/75 (!) 155/68  Pulse: 94 97  Resp: 16 16  Temp:  (!) 36.4 C  SpO2: 96% 100%    Last Pain:  Vitals:   11/18/2020 1222  TempSrc: Oral  PainSc:                  Pervis Hocking

## 2020-11-12 NOTE — Progress Notes (Signed)
RT to pt bedside. Pt tachypneic and some confusion noted. Pt on 3L nasal cannula, SpO2 98%. Bilateral breath sounds diminished with crackles. Per MD order abg drawn and sent to lab. [place] notified of specimen being sent. Pt adamantly refusing breathing treatment. Md notified. RT will continue to monitor and be available as needed.

## 2020-11-12 NOTE — Anesthesia Preprocedure Evaluation (Signed)
Anesthesia Evaluation  Patient identified by MRN, date of birth, ID band Patient awake    Reviewed: Allergy & Precautions, NPO status , Patient's Chart, lab work & pertinent test results  Airway Mallampati: II  TM Distance: >3 FB Neck ROM: Full    Dental no notable dental hx.    Pulmonary former smoker,  Quit smoking 1979   Pulmonary exam normal breath sounds clear to auscultation       Cardiovascular hypertension, Pt. on medications + CAD, + Peripheral Vascular Disease and +CHF (grade 1 diastolic dysfunction)  Normal cardiovascular exam+ dysrhythmias (hx paroxysmal Vtach, Afib on eliquis- heparin gtt currently)  Rhythm:Regular Rate:Normal  Echo 05/2020: 1. Left ventricular ejection fraction, by estimation, is 55 to 60%. The  left ventricle has normal function. The left ventricle has no regional  wall motion abnormalities. There is mild left ventricular hypertrophy.  Left ventricular diastolic parameters  are consistent with Grade I diastolic dysfunction (impaired relaxation).  2. Right ventricular systolic function is normal. The right ventricular  size is normal. Tricuspid regurgitation signal is inadequate for assessing  PA pressure.  3. The mitral valve is normal in structure. No evidence of mitral valve  regurgitation. No evidence of mitral stenosis.  4. The aortic valve was not well visualized. Aortic valve regurgitation  is not visualized. Mild to moderate aortic valve sclerosis/calcification  is present, without any evidence of aortic stenosis.    Neuro/Psych negative neurological ROS     GI/Hepatic negative GI ROS, Neg liver ROS,   Endo/Other  diabetes, Poorly Controlled, Type 2, Insulin DependentObesity BMI 37  Renal/GU Renal Insufficiency and CRFRenal diseaseCr 1.85     Musculoskeletal  (+) Arthritis , Osteoarthritis,  LLE cellulitis    Abdominal (+) + obese,   Peds  Hematology negative hematology  ROS (+) hct 37.1, plt 297   Anesthesia Other Findings   Reproductive/Obstetrics                             Anesthesia Physical Anesthesia Plan  ASA: 3  Anesthesia Plan: General and Regional   Post-op Pain Management: GA combined w/ Regional for post-op pain   Induction: Intravenous  PONV Risk Score and Plan: 2 and Ondansetron and Treatment may vary due to age or medical condition  Airway Management Planned: LMA  Additional Equipment: None  Intra-op Plan:   Post-operative Plan: Extubation in OR  Informed Consent: I have reviewed the patients History and Physical, chart, labs and discussed the procedure including the risks, benefits and alternatives for the proposed anesthesia with the patient or authorized representative who has indicated his/her understanding and acceptance.     Dental advisory given  Plan Discussed with: CRNA  Anesthesia Plan Comments:         Anesthesia Quick Evaluation

## 2020-11-12 NOTE — Op Note (Signed)
    NAME: Patrick Shelton    MRN: [id number] DOB: 27-Mar-1940    DATE OF OPERATION: 11/23/2020  PREOP DIAGNOSIS:    Gangrene left foot with PVD  POSTOP DIAGNOSIS:    Same  PROCEDURE:    Left below the knee amputation  SURGEON: Judeth Cornfield. Scot Dock, MD  ASSIST: Melene Muller, MD  ANESTHESIA: General  EBL: 100 cc  INDICATIONS:    Patrick Shelton is a 80 y.o. male who presented with a gangrenous wound of the left foot.  He did not wish to pursue arteriography and wished to pursue primary amputation.  He has a below the knee amputation on the right.  His goal was to get bilateral prostheses.  Therefore he agreed to proceed with attempted below the knee amputation.  FINDINGS:   The muscle had good bleeding.  There was some atrophy to the gastrocnemius muscle noted.  TECHNIQUE:   The patient was taken to the operating room and received a general anesthetic.  The tourniquet was placed on the upper thigh.  The left leg was prepped and draped in usual sterile fashion.  I measured the circumference of the limb 10 cm distal to the tibial tuberosity and two thirds of this distance was used to mark the anterior skin flap.  A long posterior flap of equal length was marked.  The leg was exsanguinated with an Esmarch bandage and the tourniquet inflated to 300 mmHg.  Under tourniquet control the incision was carried down through the skin, subcutaneous tissue, fascia and muscle to the tibia and fibula which were dissected free circumferentially.  The periosteum was elevated and the bone divided proximal to the level of skin division.  I beveled the anterior aspect of the tibia slightly.  The leg was then removed.  The arteries and veins were individually suture ligated with 2-0 silk ties.  The tourniquet was released.  Additional hemostasis was obtained using 2-0 silk ties and electrocautery.  The edges of the bone were rasped.  The wound was irrigated with copious amounts of saline.  The fascial layer was  then closed with interrupted 2-0 Vicryl's.  The skin was closed with staples.  A sterile dressing was applied.  The patient tolerated the procedure well was transferred to the recovery room in stable condition.  All needle and sponge counts were correct.  Given the complexity of the case a first assistant was necessary in order to expedient the procedure and safely perform the technical aspects of the operation.  Deitra Mayo, MD, FACS [facility]  DATE OF DICTATION:   11/26/2020

## 2020-11-12 NOTE — Plan of Care (Signed)

## 2020-11-12 NOTE — Progress Notes (Signed)
Nutrition Follow-up  DOCUMENTATION CODES:   Obesity unspecified  INTERVENTION:   Once diet is reusmed:  -Increase Prosource Plus to TID, each supplement provides 100 kcals and 15 grams protein -Snacks TID -Magic cup TID with meals, each supplement provides 290 kcal and 9 grams of protein  -MVI with minerals daily  NUTRITION DIAGNOSIS:   Increased nutrient needs related to wound healing as evidenced by estimated needs.  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  Reviewed I/O's: +3.7 L x 24 hours and +7.5 L since admission  UOP: 300 ml x 24 hours  Per vascular surgery notes, plan for lt BKA today. Pt is currently NPO for procedure.   Pt down in OR at time of visit. Unable to speak with pt at this time.   Pt remains with decreased oral intake. Noted meal completion 35-60%. Pt continues to have hypoglycemic episodes. He is receiving snacks between meals and consuming Prosource supplements. DM coordinator following for insulin pump needs.   Medications reviewed and include vitamin D3 and dextrose 5%-0.45% sodium chloride infusion @ 125 ml/hr.   Labs reviewed: CBGS: 60-101 (inpatient orders for glycemic control are insulin pump- TID at meals, at bedtime, and 0200).    Diet Order:   Diet Order             Diet NPO time specified Except for: Sips with Meds  Diet effective midnight                   EDUCATION NEEDS:   Education needs have been addressed  Skin:  Skin Assessment: Reviewed RN Assessment (Old R BKA, L foot 4th toe  gangrene)  Last BM:  unknown  Height:   Ht Readings from Last 1 Encounters:  11/10/20 '5\' 10"'$  (1.778 m)    Weight:   Wt Readings from Last 1 Encounters:  11/10/20 117.9 kg   BMI:  Body mass index is 37.29 kg/m.  Estimated Nutritional Needs:   Kcal:  1900-2100  Protein:  115-125 grams  Fluid:  > 1.9 L/day    Loistine Chance, RD, LDN, CDCES Registered Dietitian II Certified Diabetes Care and Education Specialist Please refer to [facility] for RD and/or RD on-call/weekend/after hours pager

## 2020-11-12 NOTE — Progress Notes (Addendum)
[facility] PROGRESS NOTE   Patrick Shelton [id number] DOB: May 20, 1940 DOA: 11/04/2020  PCP: Lorne Skeens, MD  Brief History/Interval Summary: 80 y.o. male with medical history significant of DM2, HTN, HLD, PAD.  Pt is s/p BKA of RLE previously. 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  Not improved despite wound care and multiple ABx courses as outpt. Pt sent to ED due to concern for osteomyelitis and possible bacteremia.  Apparently had subjective fevers at home.  X-ray of the foot is unchanged from films done few weeks ago.  Patient was started on IV antibiotics.  Hospitalize for further management.  Consultants: Vascular surgery  Procedures: 9/6: Left below-knee amputation  Antibiotics: Anti-infectives (From admission, onward)    Start     Dose/Rate Route Frequency Ordered Stop   11/11/2020 1400  vancomycin (VANCOREADY) IVPB 1250 mg/250 mL        1,250 mg 166.7 mL/hr over 90 Minutes Intravenous Every 24 hours 11/26/2020 1249 11/13/20 2359   11/09/20 0600  vancomycin (VANCOREADY) IVPB 1500 mg/300 mL  Status:  Discontinued        1,500 mg 150 mL/hr over 120 Minutes Intravenous Every 48 hours 11/07/20 0147 12/05/2020 1249   11/07/20 1000  cefTRIAXone (ROCEPHIN) 2 g in sodium chloride 0.9 % 100 mL IVPB        2 g 200 mL/hr over 30 Minutes Intravenous Every 24 hours 11/07/20 0136 11/14/20 0959   11/07/20 1000  metroNIDAZOLE (FLAGYL) tablet 500 mg        500 mg Oral Every 12 hours 11/07/20 0136 11/14/20 0959   11/07/20 0200  vancomycin (VANCOREADY) IVPB 2000 mg/400 mL        2,000 mg 200 mL/hr over 120 Minutes Intravenous  Once 11/07/20 0147 11/07/20 0636   11/07/20 0045  piperacillin-tazobactam (ZOSYN) IVPB 3.375 g        3.375 g 100 mL/hr over 30 Minutes Intravenous  Once 11/07/20 0039 11/07/20 0204       Subjective/Interval History: Patient was seen after he returned from the  OR.  Low glucose levels noted overnight.  Currently glucose level is 144 with nursing staff and patient.    Assessment/Plan:  Gangrenous left fourth toe in the setting of diabetes/peripheral artery disease Patient followed by vascular surgeon, Dr. Scot Dock.  Last seen in July.  Arteriogram was offered but patient declined.   Vascular surgery was consulted.  Patient not interested in angiogram due to risk of progression of her kidney disease.  Eliquis remains on hold.     Patient remains on vancomycin and ceftriaxone and metronidazole.  WBC has been elevated.  Anticipate he will not need antibiotics more than 48 hours after surgery.  Monitor WBC.  Blood cultures were negative. Patient underwent left below-knee amputation this morning.  Resumption of heparin per vascular surgery.  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  Acute failure likely secondary to infection and perhaps a component of hypovolemia.  Holding ARB and HCTZ.   Patient was started on IV fluids.  Renal function has been improving. Blood work was not done this morning.  As long as he makes urine we will just check labs tomorrow morning.  Avoid nephrotoxic agents.        Diabetes mellitus, insulin-dependent Patient is on an insulin pump.  He has signed the insulin pump contract.   Patient has been  seen by diabetes coordinator.   Due to poor oral intake patient had low glucose levels.  He was started on D5.  Continue D5 for now.  Monitor CBGs throughout the day today.  Recheck labs tomorrow.  Hopefully his oral intake will improve.  Normocytic anemia Likely due to chronic disease.  No evidence of overt bleeding.  Slight drop in hemoglobin is dilutional.  Essential hypertension Continue amlodipine.  Occasional high readings noted.  Probably due to pain issues.  Permanent atrial fibrillation Not noted to be on any rate limiting drugs.  Eliquis is on hold.   Patient was transitioned to IV heparin.  Held this morning for surgery.  Resumption per vascular surgery.    Morbid obesity Estimated body mass index is 37.29 kg/m as calculated from the following:   Height as of this encounter: '5\' 10"'$  (1.778 m).   Weight as of this encounter: 117.9 kg.  ADDENDUM 6:30P Called by RN that patient was dyspneic. Patient seen at bedside immediately. Noted to be awake, somewhat confused. Sats 95-98% on 3lpm. Noted to be tachypneic. Wheezing heard bilaterally with few crackles. Will give lasix and nebs. Stat CXR and ABG. CBG still elevated. Stat BMP ordered to make sure no DKA as apparently insulin pump was off for several hours earlier today. D5 infusion was stopped about 2 hours ago. May need to move to PCU for insulin infusion depending on BMET. Continue to monitor. Will sign out to night staff as well. Discussed with RN and rapid response.   DVT Prophylaxis: Eliquis currently on hold.  IV heparin.   Code Status: Full code Family Communication: Discussed with patient Disposition Plan: To be determined  Status is: Inpatient  Remains inpatient appropriate because:Ongoing diagnostic testing needed not appropriate for outpatient work up, IV treatments appropriate due to intensity of illness or inability to take PO, and Inpatient level of care appropriate due to severity of illness  Dispo: The patient is from: Home              Anticipated d/c is to: Home              Patient currently is not medically stable to d/c.   Difficult to place patient No       Medications: Scheduled:  [START ON 11/13/2020] (feeding supplement) PROSource Plus  30 mL Oral TID BM   allopurinol  100 mg Oral Daily   amLODipine  5 mg Oral Daily   cholecalciferol  5,000 Units Oral Daily   [START ON 11/13/2020] docusate sodium  100 mg Oral Daily   insulin pump   Subcutaneous TID WC, HS, 0200   ipratropium-albuterol       metoprolol tartrate  25 mg Oral BID   metroNIDAZOLE  500 mg Oral  Q12H   [START ON 11/13/2020] multivitamin with minerals  1 tablet Oral Daily   pantoprazole  40 mg Oral Daily   pravastatin  40 mg Oral q1800   Continuous:  cefTRIAXone (ROCEPHIN)  IV Stopped (11/11/20 1121)   dextrose 5 % and 0.45% NaCl 125 mL/hr at 11/11/2020 0539   heparin     magnesium sulfate bolus IVPB     vancomycin     [id number] **OR** acetaminophen, alum & mag hydroxide-simeth, guaiFENesin-dextromethorphan, hydrALAZINE, HYDROmorphone (DILAUDID) injection, labetalol, magnesium sulfate bolus IVPB, metoprolol tartrate, ondansetron **OR** ondansetron (ZOFRAN) IV, oxyCODONE, oxyCODONE, phenol, potassium chloride, white petrolatum   Objective:  Vital Signs  Vitals:   11/18/2020 1050 11/11/2020 1105 11/27/2020 1120 11/08/2020 1222  BP: Marland Kitchen)  157/55 (!) 127/102 (!) 166/75 (!) 155/68  Pulse: 90 85 94 97  Resp: (!) '25 18 16 16  '$ Temp:    (!) 97.5 F (36.4 C)  TempSrc:    Oral  SpO2: 93% 93% 96% 100%  Weight:      Height:        Intake/Output Summary (Last 24 hours) at 11/17/2020 1255 Last data filed at 11/26/2020 0935 Gross per 24 hour  Intake 4513.57 ml  Output 500 ml  Net 4013.57 ml    Filed Weights   11/05/2020 1954 11/10/20 1900  Weight: 117.9 kg 117.9 kg    General appearance: Awake alert.  In no distress Resp: Clear to auscultation bilaterally.  Normal effort Cardio: S1-S2 is normal regular.  No S3-S4.  No rubs murmurs or bruit GI: Abdomen is soft.  Nontender nondistended.  Bowel sounds are present normal.  No masses organomegaly Extremities: Now status post bilateral BKA's. Neurologic: Alert and oriented x3.  No focal neurological deficits.       Lab Results:  Data Reviewed: I have personally reviewed following labs and imaging studies  CBC: Recent Labs  Lab 11/04/2020 2031 11/07/20 0414 11/08/20 0232 11/09/20 0159 11/10/20 0517 11/11/20 0649 11/11/20 2236  WBC 20.0*   < > 21.0* 26.2* 20.7* 20.1* 19.5*  NEUTROABS 14.8*  --   --   --   --   --   --   HGB  12.3*   < > 11.2* 10.8* 11.2* 11.0* 12.0*  HCT 37.0*   < > 35.2* 33.4* 34.2* 34.1* 37.1*  MCV 92.5   < > 93.1 92.5 92.4 92.4 94.2  PLT 280   < > 304 306 273 320 297   < > = values in this interval not displayed.     Basic Metabolic Panel: Recent Labs  Lab 11/07/20 0414 11/08/20 0232 11/09/20 0159 11/10/20 0517 11/11/20 0203  NA 134* 135 135 135 135  K 4.0 4.1 4.3 4.4 4.9  CL 101 99 103 103 104  CO2 '23 29 25 24 '$ 20*  GLUCOSE 129* 75 57* 112* 139*  BUN 51* 46* 51* 52* 47*  CREATININE 2.19* 2.37* 2.32* 2.18* 1.85*  CALCIUM 8.6* 8.8* 8.6* 8.6* 8.8*     GFR: Estimated Creatinine Clearance: 41 mL/min (A) (by C-G formula based on SCr of 1.85 mg/dL (H)).  Liver Function Tests: Recent Labs  Lab 10/29/2020 2031  AST 31  ALT 31  ALKPHOS 74  BILITOT 0.2*  PROT 7.6  ALBUMIN 2.6*      CBG: Recent Labs  Lab 11/11/20 2226 11/27/2020 0140 11/11/2020 0256 11/25/2020 0330 12/04/2020 0935  GLUCAP 75 84 60* 101* 100*       Recent Results (from the past 240 hour(s))  Resp Panel by RT-PCR (Flu A&B, Covid) Nasopharyngeal Swab     Status: None   Collection Time: 10/16/2020  7:59 PM   Specimen: Nasopharyngeal Swab; Nasopharyngeal(NP) swabs in vial transport medium  Result Value Ref Range Status   SARS Coronavirus 2 by RT PCR NEGATIVE NEGATIVE Final    Comment: (NOTE) SARS-CoV-2 target nucleic acids are NOT DETECTED.  The SARS-CoV-2 RNA is generally detectable in upper respiratory specimens during the acute phase of infection. The lowest concentration of SARS-CoV-2 viral copies this assay can detect is 138 copies/mL. A negative result does not preclude SARS-Cov-2 infection and should not be used as the sole basis for treatment or other patient management decisions. A negative result may occur with  improper specimen collection/handling, submission of  specimen other than nasopharyngeal swab, presence of viral mutation(s) within the areas targeted by this assay, and inadequate number  of viral copies(<138 copies/mL). A negative result must be combined with clinical observations, patient history, and epidemiological information. The expected result is Negative.  Fact Sheet for Patients:  EntrepreneurPulse.com.au  Fact Sheet for Healthcare Providers:  IncredibleEmployment.be  This test is no t yet approved or cleared by the [place] FDA and  has been authorized for detection and/or diagnosis of SARS-CoV-2 by FDA under an Emergency Use Authorization (EUA). This EUA will remain  in effect (meaning this test can be used) for the duration of the COVID-19 declaration under Section 564(b)(1) of the Act, 21 U.S.C.section 360bbb-3(b)(1), unless the authorization is terminated  or revoked sooner.       Influenza A by PCR NEGATIVE NEGATIVE Final   Influenza B by PCR NEGATIVE NEGATIVE Final    Comment: (NOTE) The Xpert Xpress SARS-CoV-2/FLU/RSV plus assay is intended as an aid in the diagnosis of influenza from Nasopharyngeal swab specimens and should not be used as a sole basis for treatment. Nasal washings and aspirates are unacceptable for Xpert Xpress SARS-CoV-2/FLU/RSV testing.  Fact Sheet for Patients: EntrepreneurPulse.com.au  Fact Sheet for Healthcare Providers: IncredibleEmployment.be  This test is not yet approved or cleared by the [place] FDA and has been authorized for detection and/or diagnosis of SARS-CoV-2 by FDA under an Emergency Use Authorization (EUA). This EUA will remain in effect (meaning this test can be used) for the duration of the COVID-19 declaration under Section 564(b)(1) of the Act, 21 U.S.C. section 360bbb-3(b)(1), unless the authorization is terminated or revoked.  Performed at [facility], [place] [address], [place], [place] [address]   Blood culture (routine x 2)     Status: None   Collection Time: 11/04/2020  8:15 PM   Specimen: BLOOD RIGHT HAND   Result Value Ref Range Status   Specimen Description BLOOD RIGHT HAND  Final   Special Requests   Final    BOTTLES DRAWN AEROBIC AND ANAEROBIC Blood Culture results may not be optimal due to an excessive volume of blood received in culture bottles   Culture   Final    NO GROWTH 5 DAYS Performed at [facility], [place] [address], [place], [place] [address]    Report Status 11/11/2020 FINAL  Final  Blood culture (routine x 2)     Status: None   Collection Time: 10/28/2020  8:32 PM   Specimen: BLOOD LEFT ARM  Result Value Ref Range Status   Specimen Description BLOOD LEFT ARM  Final   Special Requests   Final    BOTTLES DRAWN AEROBIC ONLY Blood Culture adequate volume   Culture   Final    NO GROWTH 5 DAYS Performed at [facility], [place] [address] [place], [place] [address]    Report Status 11/11/2020 FINAL  Final  MRSA Next Gen by PCR, Nasal     Status: None   Collection Time: 11/07/20  3:29 AM   Specimen: Nasal Mucosa; Nasal Swab  Result Value Ref Range Status   MRSA by PCR Next Gen NOT DETECTED NOT DETECTED Final    Comment: (NOTE) The GeneXpert MRSA Assay (FDA approved for NASAL specimens only), is one component of a comprehensive MRSA colonization surveillance program. It is not intended to diagnose MRSA infection nor to guide or monitor treatment for MRSA infections. Test performance is not FDA approved in patients less than 73 years old.  Performed at [facility], [place] [address], [place], [place] [address]        Radiology Studies: DG CHEST PORT 1 VIEW  Result Date: 11/11/2020 CLINICAL DATA:  Preop for interpretation surgery tomorrow. EXAM: PORTABLE CHEST 1 VIEW COMPARISON:  Two view chest x-ray 10/31/2020 FINDINGS: Heart is enlarged. Lung volumes remain low. Atherosclerotic changes are present at the aortic arch. There is significant airspace disease is present. Aeration of the lung bases is improved. IMPRESSION: Improved aeration of the lung bases  bilaterally. Electronically Signed   By: San Morelle M.D.   On: 11/11/2020 13:55       LOS: 5 days   [facility] Pager on [email or web address]  11/25/2020, 12:55 PM

## 2020-11-12 NOTE — Interval H&P Note (Signed)
We will history and Physical Interval Note:  11/13/2020 6:46 AM  Patrick Shelton  has presented today for surgery, with the diagnosis of Cellulitis.  The various methods of treatment have been discussed with the patient and family. After consideration of risks, benefits and other options for treatment, the patient has consented to  Procedure(s): AMPUTATION BELOW KNEE (Left) as a surgical intervention.  The patient's history has been reviewed, patient examined, no change in status, stable for surgery.  I have reviewed the patient's chart and labs.  Questions were answered to the patient's satisfaction.     Deitra Mayo

## 2020-11-12 NOTE — Progress Notes (Addendum)
Went to bedside to reevaluate Patrick Shelton after Dr. [place] Pink had reported that pt had dyspnea and confusion at 6:30 pm today. CXR ordered and shows mild pulmonary edema consistent with CHF.  ABG shows pH 7.209, pCO2 57.4, pO2 81.  Blood sugar 387 on BMP. Blood glucose now 304.  Fluids were turned off a few hours ago.  Pt is confused and mumbles and not following commands Insulin pump was off for a few hours today and is still not on.  CV-RRR Lung-equal breath sounds, diminished in bases with mild basilar rales Abdomen-Soft. Nontender. Normal bowel sounds  Check BNP, troponin ordered. Check beta hydroxybuturate Head CT scan with confusion. Start Bipap with hypercarbia on ABG. Given 6 units SQ insulin now Transfer to Progressive care as may need insulin infusion if does not rapidly correct.  Resume gentle IVF hydration.    Addendum: Betahydroxybutarate mildly elevated.  Blood sugar down to 220.  On IVF.  On Bipap. Vitals stable.  Blood sugar check q 6 hour with SSI as needed.  Monitor closely

## 2020-11-12 NOTE — Progress Notes (Signed)
Pharmacy Antibiotic Note  Patrick Shelton is a 80 y.o. male admitted on 10/11/2020 with gangrenous L 4th toe and suspected osteomyelitis.   Now s/p L BKA. SCr 1.85 on 9/4   Plan: -Adjust vancomycin to 1250 mg IV Q 24 hours per nomogram based dosing  -Continue ceftriaxone IV 2 g Q24H and metronidazole PO 500 mg Q12H -Follow antibiotic length of therapy, planned for 7 days -Monitor clinical status, renal function, and cultures for any changes in therapy  Height: '5\' 10"'$  (177.8 cm) Weight: 117.9 kg (259 lb 14.8 oz) IBW/kg (Calculated) : 73  Temp (24hrs), Avg:97.7 F (36.5 C), Min:97.5 F (36.4 C), Max:98 F (36.7 C)  Recent Labs  Lab 10/26/2020 2032 11/07/20 0140 11/07/20 0414 11/08/20 0232 11/09/20 0159 11/10/20 0517 11/11/20 0203 11/11/20 0649 11/11/20 2236  WBC  --   --  16.8* 21.0* 26.2* 20.7*  --  20.1* 19.5*  CREATININE  --   --  2.19* 2.37* 2.32* 2.18* 1.85*  --   --   LATICACIDVEN 1.4 1.3  --   --   --   --   --   --   --      Estimated Creatinine Clearance: 41 mL/min (A) (by C-G formula based on SCr of 1.85 mg/dL (H)).    Allergies  Allergen Reactions   Prednisone Anaphylaxis and Shortness Of Breath   Dulaglutide Nausea Only   Atorvastatin    Lovastatin     Other reaction(s): OTHER REACTION    Antimicrobials this admission: Piperacillin/tazobactam 9/1 x1 dose Vancomycin 9/1 >>  Ceftriaxone 9/1 >>  Metronidazole 9/1 >>   Microbiology results: 8/31 Bcx: NGTD 8/32 COVID/Flu: negative 9/1 MRSA: negative  Thank you for allowing pharmacy to be a part of this patient's care.  Albertina Parr, PharmD., BCPS, BCCCP Clinical Pharmacist Please refer to [facility] for unit-specific pharmacist    Please check [email or web address] for unit-specific pharmacy phone numbers.

## 2020-11-12 NOTE — Anesthesia Procedure Notes (Signed)
Procedure Name: Intubation Date/Time: 11/15/2020 8:10 AM Performed by: Jonna Munro, CRNA Pre-anesthesia Checklist: Patient identified, Emergency Drugs available, Suction available, Patient being monitored and Timeout performed Patient Re-evaluated:Patient Re-evaluated prior to induction Oxygen Delivery Method: Circle system utilized Preoxygenation: Pre-oxygenation with 100% oxygen Induction Type: IV induction Ventilation: Two handed mask ventilation required Laryngoscope Size: Mac and 4 Grade View: Grade II Tube type: Oral Tube size: 7.5 mm Number of attempts: 1 Airway Equipment and Method: Stylet Placement Confirmation: ETT inserted through vocal cords under direct vision, positive ETCO2 and breath sounds checked- equal and bilateral Secured at: 26 cm Tube secured with: Tape Dental Injury: Teeth and Oropharynx as per pre-operative assessment

## 2020-11-12 NOTE — Anesthesia Procedure Notes (Signed)
Anesthesia Regional Block: Adductor canal block   Pre-Anesthetic Checklist: , timeout performed,  Correct Patient, Correct Site, Correct Laterality,  Correct Procedure, Correct Position, site marked,  Risks and benefits discussed,  Surgical consent,  Pre-op evaluation,  At surgeon's request and post-op pain management  Laterality: Left  Prep: Maximum Sterile Barrier Precautions used, chloraprep       Needles:  Injection technique: Single-shot  Needle Type: Echogenic Stimulator Needle     Needle Length: 9cm  Needle Gauge: 22     Additional Needles:   Procedures:,,,, ultrasound used (permanent image in chart),,    Narrative:  Start time: 12/05/2020 7:20 AM End time: 11/27/2020 7:25 AM Injection made incrementally with aspirations every 5 mL.  Performed by: Personally  Anesthesiologist: Pervis Hocking, DO  Additional Notes: Monitors applied. No increased pain on injection. No increased resistance to injection. Injection made in 5cc increments. Good needle visualization. Patient tolerated procedure well.

## 2020-11-12 NOTE — Transfer of Care (Signed)
Immediate Anesthesia Transfer of Care Note  Patient: Patrick Shelton  Procedure(s) Performed: AMPUTATION BELOW KNEE (Left: Knee)  Patient Location: PACU  Anesthesia Type:General  Level of Consciousness: awake, oriented, drowsy and patient cooperative  Airway & Oxygen Therapy: Patient Spontanous Breathing and Patient connected to face mask oxygen  Post-op Assessment: Report given to RN and Post -op Vital signs reviewed and stable  Post vital signs: Reviewed and stable  Last Vitals:  Vitals Value Taken Time  BP    Temp    Pulse    Resp    SpO2      Last Pain:  Vitals:   11/22/2020 0538  TempSrc: Oral  PainSc:       Patients Stated Pain Goal: 3 ([id number])  Complications: No notable events documented.

## 2020-11-12 NOTE — Anesthesia Procedure Notes (Signed)
Anesthesia Regional Block: Popliteal block   Pre-Anesthetic Checklist: , timeout performed,  Correct Patient, Correct Site, Correct Laterality,  Correct Procedure, Correct Position, site marked,  Risks and benefits discussed,  Surgical consent,  Pre-op evaluation,  At surgeon's request and post-op pain management  Laterality: Left  Prep: Maximum Sterile Barrier Precautions used, chloraprep       Needles:  Injection technique: Single-shot  Needle Type: Echogenic Stimulator Needle     Needle Length: 9cm  Needle Gauge: 22     Additional Needles:   Procedures:,,,, ultrasound used (permanent image in chart),,    Narrative:  Start time: 11/15/2020 7:15 AM End time: 11/27/2020 7:20 AM Injection made incrementally with aspirations every 5 mL.  Performed by: Personally  Anesthesiologist: Pervis Hocking, DO  Additional Notes: Monitors applied. No increased pain on injection. No increased resistance to injection. Injection made in 5cc increments. Good needle visualization. Patient tolerated procedure well.

## 2020-11-12 NOTE — Progress Notes (Signed)
ANTICOAGULATION CONSULT NOTE - Follow Up Consult  Pharmacy Consult for Heparin Indication: atrial fibrillation and history of DVT  Allergies  Allergen Reactions   Prednisone Anaphylaxis and Shortness Of Breath   Dulaglutide Nausea Only   Atorvastatin    Lovastatin     Other reaction(s): OTHER REACTION    Patient Measurements: Height: '5\' 10"'$  (177.8 cm) Weight: 117.9 kg (259 lb 14.8 oz) IBW/kg (Calculated) : 73 kg Heparin Dosing Weight: 99.2 kg  Vital Signs: Temp: 97.5 F (36.4 C) (09/06 1222) Temp Source: Oral (09/06 1222) BP: 155/68 (09/06 1222) Pulse Rate: 97 (09/06 1222)  Labs: Recent Labs    11/10/20 0517 11/10/20 1439 11/11/20 0203 11/11/20 0649 11/11/20 2235 11/11/20 2236  HGB 11.2*  --   --  11.0*  --  12.0*  HCT 34.2*  --   --  34.1*  --  37.1*  PLT 273  --   --  320  --  297  APTT 27 38*  --  53*  --   --   HEPARINUNFRC 0.28*  --   --  0.19* 0.28*  --   CREATININE 2.18*  --  1.85*  --   --   --      Estimated Creatinine Clearance: 41 mL/min (A) (by C-G formula based on SCr of 1.85 mg/dL (H)).   Medical History: Past Medical History:  Diagnosis Date   Arthritis Sept. 2015   Rh. Neck and Upper Back   Arthritis Sept. 2015   Gout-Right Hand  Left knee   Cancer ([place])    8 wks Radiation   Chronic kidney disease    Diabetes mellitus    ED (erectile dysfunction)    Hx of agent Orange exposure    [place] during that conflict   Hyperlipidemia    Hypertension    PVD (peripheral vascular disease) ([place])    PVT (paroxysmal ventricular tachycardia) ([facility])    S/P BKA (below knee amputation) [facility])     Assessment: 80 yo male presents with possible osteomyelitis. PTA the patient is on apixaban for atrial fibrillation (last dose 9/1 @ 0543). The patient also reports a history of DVT that was >10 years ago and was treated with warfarin. He was transitioned to heparin in the hospital and this was held this morning prior to surgery. The patient  is now s/p L below knee amputation. Pharmacy consulted to resume IV heparin hours post op at 5 PM today   H/H mildly low. Plt wnl   Goal of Therapy:  Heparin level 0.3-0.5 units/ml Monitor platelets by anticoagulation protocol: Yes   Plan:  Restart heparin IV at 2000 units/hr (no bolus) at 5 PM today  Check an 8-hour heparin level post heparin start  Monitor a daily heparin level and CBC Watch for signs and symptoms of bleeding  Albertina Parr, PharmD., BCPS, BCCCP Clinical Pharmacist Please refer to [facility] for unit-specific pharmacist    Please check [email or web address] for unit-specific pharmacy phone numbers.

## 2020-11-12 NOTE — Progress Notes (Signed)
  Hypoglycemic Event  CBG 53  Treatment: 15 grams of carbs  And increase IV fluids to 100 ml per hour  Symptoms: None  Follow up CBG Time: 2226  CBG Result: 75  Possible Reasons for Event: inadequate meal intake  Comments/MD Notified Olena Heckle NP notified and ordered the increase in D51/2NS to 100 ml per hour.   Hypoglycemic Event  CBG: 60  Treatment: D50 25 mL (12.5 gm)  Symptoms: None  Follow-up CBG: Time:0330 CBG Result:101  Possible Reasons for Event: Inadequate meal intake  Comments/MD notified:J. Olena Heckle, NP    Avanell Shackleton Eswin Worrell

## 2020-11-12 NOTE — Progress Notes (Signed)
ANTICOAGULATION CONSULT NOTE - Follow Up Consult  Pharmacy Consult for heparin Indication:  Afib/DVT  Labs: Recent Labs    11/09/20 0159 11/09/20 1831 11/10/20 0517 11/10/20 1439 11/11/20 0203 11/11/20 0649 11/11/20 2235 11/11/20 2236  HGB 10.8*  --  11.2*  --   --  11.0*  --  12.0*  HCT 33.4*  --  34.2*  --   --  34.1*  --  37.1*  PLT 306  --  273  --   --  320  --  297  APTT  --    < > 27 38*  --  53*  --   --   HEPARINUNFRC  --   --  0.28*  --   --  0.19* 0.28*  --   CREATININE 2.32*  --  2.18*  --  1.85*  --   --   --    < > = values in this interval not displayed.    Assessment: 80yo male subtherapeutic on heparin after rate change; no infusion issues or signs of bleeding per RN.  Goal of Therapy:  Heparin level 0.3-0.7 units/ml   Plan:  Will increase heparin infusion by 1 unit/kg/hr to 2800 units/hr and f/u after OR.    Wynona Neat, PharmD, BCPS  12/06/2020,12:13 AM

## 2020-11-13 ENCOUNTER — Encounter (HOSPITAL_COMMUNITY): Payer: Self-pay | Admitting: Vascular Surgery

## 2020-11-13 ENCOUNTER — Inpatient Hospital Stay (HOSPITAL_COMMUNITY): Payer: Medicare Other

## 2020-11-13 DIAGNOSIS — N183 Chronic kidney disease, stage 3 unspecified: Secondary | ICD-10-CM | POA: Diagnosis not present

## 2020-11-13 DIAGNOSIS — N179 Acute kidney failure, unspecified: Secondary | ICD-10-CM | POA: Diagnosis not present

## 2020-11-13 DIAGNOSIS — E11628 Type 2 diabetes mellitus with other skin complications: Secondary | ICD-10-CM | POA: Diagnosis not present

## 2020-11-13 DIAGNOSIS — Z89511 Acquired absence of right leg below knee: Secondary | ICD-10-CM | POA: Diagnosis not present

## 2020-11-13 LAB — CBC
HCT: 33.6 % — ABNORMAL LOW (ref 39.0–52.0)
Hemoglobin: 10.6 g/dL — ABNORMAL LOW (ref 13.0–17.0)
MCH: 29.9 pg (ref 26.0–34.0)
MCHC: 31.5 g/dL (ref 30.0–36.0)
MCV: 94.6 fL (ref 80.0–100.0)
Platelets: 345 10*3/uL (ref 150–400)
RBC: 3.55 MIL/uL — ABNORMAL LOW (ref 4.22–5.81)
RDW: 14.6 % (ref 11.5–15.5)
WBC: 24.7 10*3/uL — ABNORMAL HIGH (ref 4.0–10.5)
nRBC: 0 % (ref 0.0–0.2)

## 2020-11-13 LAB — GLUCOSE, CAPILLARY
Glucose-Capillary: 220 mg/dL — ABNORMAL HIGH (ref 70–99)
Glucose-Capillary: 127 mg/dL — ABNORMAL HIGH (ref 70–99)
Glucose-Capillary: 79 mg/dL (ref 70–99)
Glucose-Capillary: 101 mg/dL — ABNORMAL HIGH (ref 70–99)

## 2020-11-13 LAB — HEPARIN LEVEL (UNFRACTIONATED)
Heparin Unfractionated: 0.2 [IU]/mL — ABNORMAL LOW (ref 0.30–0.70)
Heparin Unfractionated: 0.31 IU/mL (ref 0.30–0.70)

## 2020-11-13 LAB — BASIC METABOLIC PANEL WITH GFR
Anion gap: 9 (ref 5–15)
BUN: 46 mg/dL — ABNORMAL HIGH (ref 8–23)
CO2: 23 mmol/L (ref 22–32)
Calcium: 9 mg/dL (ref 8.9–10.3)
Chloride: 106 mmol/L (ref 98–111)
Creatinine, Ser: 2.07 mg/dL — ABNORMAL HIGH (ref 0.61–1.24)
GFR, Estimated: 32 mL/min — ABNORMAL LOW
Glucose, Bld: 210 mg/dL — ABNORMAL HIGH (ref 70–99)
Potassium: 6 mmol/L — ABNORMAL HIGH (ref 3.5–5.1)
Sodium: 138 mmol/L (ref 135–145)

## 2020-11-13 LAB — TROPONIN I (HIGH SENSITIVITY): Troponin I (High Sensitivity): 20 ng/L — ABNORMAL HIGH (ref <18)

## 2020-11-13 LAB — HEMOGLOBIN A1C
Hgb A1c MFr Bld: 7.2 % — ABNORMAL HIGH (ref 4.8–5.6)
Mean Plasma Glucose: 159.94 mg/dL

## 2020-11-13 LAB — SURGICAL PATHOLOGY

## 2020-11-13 MED ORDER — INSULIN ASPART 100 UNIT/ML IJ SOLN: 0.0000 [IU] | Freq: Four times a day (QID) | INTRAMUSCULAR | Status: DC | PRN

## 2020-11-13 MED ORDER — DEXTROSE 50 % IV SOLN
INTRAVENOUS | Status: AC
  Filled 2020-11-13: qty 50

## 2020-11-13 MED ORDER — SODIUM ZIRCONIUM CYCLOSILICATE 10 G PO PACK
10.0000 g | PACK | Freq: Two times a day (BID) | ORAL | Status: AC
  Administered 2020-11-13 (×2): 10 g via ORAL
  Filled 2020-11-13 (×2): qty 1

## 2020-11-13 NOTE — Progress Notes (Signed)
Pts SP02 is good off BIPAP.  Pt is oriented to time and place.  Placed him on NRB mask for trip to [place].  RN will notify RT when they return.

## 2020-11-13 NOTE — Progress Notes (Addendum)
Inpatient Rehab Admissions Coordinator:    I spoke with pt. And wife regarding potential CIR admit. They stated interest and that Pt. Will have 24/7 support at home. I will follow and pursue for potential admit pending medical readiness and bed availability.   Clemens Catholic, [place], [place] Admissions Coordinator  [phone] ([place]) [phone] (office)

## 2020-11-13 NOTE — Evaluation (Signed)
Occupational Therapy Evaluation Patient Details Name: Patrick Shelton MRN: [id number] DOB: [birth year] Today's Date: 11/13/2020    History of Present Illness 80 yo male presents to [facility] on 8/31 with L 4th toe pain and osteomyeltis, bacteremia. 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 PMH includes OA, gout, cancer with radiation, DM with retinopathy and neuropathy, HLD, HTN, R BKA 2009.   Clinical Impression   PTA patient was living with his spouse in a private residence and was grossly wheelchair bound. PCA assisted with ADLs/IADLs. Patient could transfer to wheelchair via stand-pivot with use of RW and ambulate up to 20-44f. Patient currently functioning below baseline requiring Mod to Max A +2 grossly for bed mobility. Patient demonstrates poor insight and continued confusion refusing further attempts at functional transfers after unsuccessful lateral scoots at EOB reporting not feeling "psychologically ready" at this time. Education provided on importance of participation with therapy efforts but patient continued to decline. Patient limited by deficits listed below including decreased AROM/strength, decreased sitting balance, decreased cardiopulmonary endurance and decreased cognition and would benefit from continued acute OT services in prep for safe d/c to next level of care. Patient demonstrates functional decline and would benefit from intense rehab with recommendation for CIR. OT will continue to follow acutely.      Follow Up Recommendations  CIR    Equipment Recommendations  Other (comment) (Defer to next level of care.)    Recommendations for Other Services Rehab consult     Precautions / Restrictions Precautions Precautions: Fall Precaution Comments: new L BKA, chronic R BKA with prosthetic Restrictions Weight Bearing Restrictions: Yes RLE Weight Bearing:  (prosthesis) LLE Weight Bearing: Non weight bearing      Mobility Bed Mobility Overal bed mobility: Needs Assistance Bed Mobility:  Rolling;Sidelying to Sit;Sit to Supine Rolling: Min assist;+2 for physical assistance Sidelying to sit: Mod assist;+2 for physical assistance   Sit to supine: Max assist;+2 for physical assistance   General bed mobility comments: Mod A for rolling to R with cues for hand placement and sequencing. Able to elevate trunk with heavy use of rail and Mod A +2. Max assist +2 for return to supine and B residual limbs and trunk.    Transfers Overall transfer level: Needs assistance Equipment used: 2 person hand held assist Transfers: Lateral/Scoot Transfers          Lateral/Scoot Transfers: Max assist;+2 physical assistance General transfer comment: Unsuccessful attempts for lateral scooting to recliner on R despite Max A +2 with patient reporting needing something to push from at LLE demonstrating poor insight. Patient refused AP transfer with desire to return to supine.    Balance Overall balance assessment: Needs assistance Sitting-balance support: Bilateral upper extremity supported Sitting balance-Leahy Scale: Poor Sitting balance - Comments: Min A initially progressing to Min guard with bilateral UE support on bed surface. Postural control: Posterior lean     Standing balance comment: nt                           ADL either performed or assessed with clinical judgement   ADL Overall ADL's : Needs assistance/impaired     Grooming: Set up;Sitting   Upper Body Bathing: Moderate assistance;Bed level   Lower Body Bathing: Maximal assistance;Bed level   Upper Body Dressing : Minimal assistance;Sitting Upper Body Dressing Details (indicate cue type and reason): Donned posterior hospital gown seated EOB. Lower Body Dressing: Maximal assistance;Bed level Lower Body Dressing Details (indicate cue type and reason): Able to  don/doff R prosthetic seated EOB. Shrinker already on.                     Vision Baseline Vision/History: 1 Wears glasses (Readers) Ability  to See in Adequate Light: 0 Adequate Patient Visual Report: No change from baseline Vision Assessment?: No apparent visual deficits     Perception     Praxis      Pertinent Vitals/Pain Pain Assessment: Faces Faces Pain Scale: Hurts a little bit Pain Location: Bilateral hands (neuropathy) and buttocks (wound) Pain Descriptors / Indicators: Discomfort;Other (Comment) (neuropathy vs gout) Pain Intervention(s): Limited activity within patient's tolerance;Monitored during session;Repositioned     Hand Dominance Right   Extremity/Trunk Assessment Upper Extremity Assessment Upper Extremity Assessment: Generalized weakness (Reports Hx of bilateral rotator cuff injury s/p repair)   Lower Extremity Assessment Lower Extremity Assessment: Defer to PT evaluation   Cervical / Trunk Assessment Cervical / Trunk Assessment: Kyphotic (Mild)   Communication Communication Communication: No difficulties   Cognition Arousal/Alertness: Awake/alert Behavior During Therapy: WFL for tasks assessed/performed (irritable, defensive) Overall Cognitive Status: Impaired/Different from baseline Area of Impairment: Memory;Attention;Following commands;Safety/judgement;Problem solving                   Current Attention Level: Sustained   Following Commands: Follows one step commands with increased time Safety/Judgement: Decreased awareness of deficits;Decreased awareness of safety   Problem Solving: Difficulty sequencing;Decreased initiation;Requires verbal cues;Requires tactile cues;Slow processing General Comments: oriented to self, location, and year. Lacks insight into deficits, is irritable and resistant to PT/OT motivation to progress mobility. On multiple occasions, pt asks PT or OT to wait so he can focus on "one thing at a time". Pt states PT and OT are trying to "intimidate" him when encouraging mobility.   General Comments  SpO44mn 90% on 4LO2 during mobility, other VSS    Exercises      Shoulder Instructions      Home Living Family/patient expects to be discharged to:: Private residence Living Arrangements: Spouse/significant other Available Help at Discharge: Family Type of Home: House Home Access: Ramped entrance     Home Layout: One level     Bathroom Shower/Tub: Tub/shower unit;Other (comment) (pt sponge bathes; plans to install roll-in shower)         Home Equipment: Tub bench;Electric scooter;Wheelchair - [employer]- 4 wheels;Other (comment)   Additional Comments: transfer board      Prior Functioning/Environment Level of Independence: Needs assistance  Gait / Transfers Assistance Needed: PTA patient could ambulate 20-368fwith rollator but reports not walking much in the past several weeks. Reports being mostly wheelchair bound. Able to self-propel manual wheelchair with LLE and R prosthetic PTA. ADL's / Homemaking Assistance Needed: Has aide 2 hours/day, 6 days/week who assists with sponge bathing/dressing and IADLs including meal prep, laundry med management and housekeeping. Pt [place] 16 [place] get [place] and off tub bench several months ago but that it has become "unsafe" recently.            OT Problem List: Decreased strength;Decreased range of motion;Decreased activity tolerance;Impaired balance (sitting and/or standing);Decreased cognition;Decreased safety awareness;Decreased knowledge of use of DME or AE;Obesity;Impaired UE functional use      OT Treatment/Interventions: Self-care/ADL training;Therapeutic exercise;Energy conservation;DME and/or AE instruction;Therapeutic activities;Cognitive remediation/compensation;Patient/family education;Balance training    OT Goals(Current goals can be found in the care plan section) Acute Rehab OT Goals Patient Stated Goal: To return home. OT Goal Formulation: With patient Time For  Goal Achievement: 11/27/20 Potential to Achieve Goals: Good ADL  Goals Pt Will Perform Grooming: with set-up;sitting (wheelchair level at sink) Pt Will Perform Upper Body Dressing: with set-up;sitting Pt Will Perform Lower Body Dressing: with mod assist;bed level;with adaptive equipment Pt Will Transfer to Toilet: with mod assist;with transfer board;anterior/posterior transfer;bedside commode (drop arm BSC) Pt Will Perform Toileting - Clothing Manipulation and hygiene: with mod assist;sitting/lateral leans;with adaptive equipment Additional ADL Goal #1: Patient will maintain unsupported static sitting balance at EOB for 10-15 minutes in prep for ADLs. Additional ADL Goal #2: Patient will complete transfer to recliner with LRAD and Mod A in prep for ADLs.  OT Frequency: Min 2X/week   Barriers to D/C: Decreased caregiver support  Family unable to provide current level of necessary assist.       Co-evaluation PT/OT/SLP Co-Evaluation/Treatment: Yes Reason for Co-Treatment: Complexity of the patient's impairments (multi-system involvement) PT goals addressed during session: Mobility/safety with mobility;Balance OT goals addressed during session: ADL's and self-care      AM-PAC OT "6 Clicks" Daily Activity     Outcome Measure Help from another person eating meals?: None Help from another person taking care of personal grooming?: A Little Help from another person toileting, which includes using toliet, bedpan, or urinal?: Total Help from another person bathing (including washing, rinsing, drying)?: Total Help from another person to put on and taking off regular upper body clothing?: A Little Help from another person to put on and taking off regular lower body clothing?: Total 6 Click Score: 13   End of Session Equipment Utilized During Treatment: Gait belt Nurse Communication: Mobility status;Other (comment) (Need for bed repair (foot of bed slowly sinks).)  Activity Tolerance: Other (comment) (Patient refused functional transfers.) Patient left: in  bed;with call bell/phone within reach;with bed alarm set  OT Visit Diagnosis: Other abnormalities of gait and mobility (R26.89);Muscle weakness (generalized) (M62.81);Other symptoms and signs involving cognitive function                Time: [id number] OT Time Calculation (min): 32 min Charges:  OT General Charges $OT Visit: 1 Visit OT Evaluation $OT Eval Moderate Complexity: 1 Mod  Clytee Heinrich H. OTR/L Supplemental OT, Department of rehab services [phone]  Jayliani Wanner R H. 11/13/2020, 2:27 PM

## 2020-11-13 NOTE — Evaluation (Signed)
Physical Therapy Evaluation Patient Details Name: Patrick Shelton MRN: [id number] DOB: December 26, 1940 Today's Date: 11/13/2020   History of Present Illness  80 yo male presents to [facility] on 8/31 with L 4th toe pain and osteomyeltis, bacteremia. 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 PMH includes OA, gout, cancer with radiation, DM with retinopathy and neuropathy, HLD, HTN, R BKA 2009.  Clinical Impression  Pt presents with impaired strength, difficulty performing bed level and EOB tasks, impaired ability to transfer OOB, poor baalnce making pt at high risk of falls, and decreased activity tolerance vs baseline. Pt to benefit from acute PT to address deficits. Pt requiring mod-max assist +2 for moving to/from EOB, unable to transfer OOB this day.  PT to progress mobility as tolerated, and will continue to follow acutely.      Follow Up Recommendations CIR    Equipment Recommendations  None recommended by PT    Recommendations for Other Services       Precautions / Restrictions Precautions Precautions: Fall Precaution Comments: new L BKA, chronic R BKA Restrictions Weight Bearing Restrictions: Yes RLE Weight Bearing:  (prosthesis) LLE Weight Bearing: Non weight bearing      Mobility  Bed Mobility Overal bed mobility: Needs Assistance Bed Mobility: Rolling;Sidelying to Sit;Sit to Supine Rolling: Min assist;+2 for physical assistance Sidelying to sit: Mod assist;+2 for physical assistance   Sit to supine: Max assist;+2 for physical assistance   General bed mobility comments: mod assist for log roll to EOB, trunk elevation, and scooting hips to EOB. Max assist +2 for return to supine for trunk and LE management, boost up in bed.    Transfers Overall transfer level: Needs assistance Equipment used: 2 person hand held assist Transfers: Lateral/Scoot Transfers          Lateral/Scoot Transfers: Max assist;+2 physical assistance General transfer comment: pt able to scoot x2 towards R with max +2 for  truncal boost and LE blocking, could not progress to semi-WB through R prosthesis and refused to further attempt transfer OOB.  Ambulation/Gait                Stairs            Wheelchair Mobility    Modified Rankin (Stroke Patients Only)       Balance Overall balance assessment: Needs assistance Sitting-balance support: Bilateral upper extremity supported Sitting balance-Leahy Scale: Poor Sitting balance - Comments: brief periods of close guard, otherwise requires min posterior assist to maintain upright Postural control: Posterior lean     Standing balance comment: nt                             Pertinent Vitals/Pain Pain Assessment: Faces Faces Pain Scale: Hurts a little bit Pain Location: hands, buttocks Pain Descriptors / Indicators: Discomfort;Other (Comment) (neuropathy vs gout) Pain Intervention(s): Limited activity within patient's tolerance;Monitored during session;Repositioned    Home Living Family/patient expects to be discharged to:: Private residence Living Arrangements: Spouse/significant other Available Help at Discharge: Family Type of Home: House Home Access: Ramped entrance     Home Layout: One level Home Equipment: Tub bench;Electric scooter;Wheelchair - [employer] - 4 wheels;Other (comment) Additional Comments: transfer board    Prior Function Level of Independence: Needs assistance   Gait / Transfers Assistance Needed: pt reports being mostly w/c level, states "I don't walk that much anymore". Pt reports he was walking 20-30 ft max at a time prior to operation.  ADL's / Homemaking Assistance Needed:  aide 2 hours/day, 6 days/week. Aide helps pt sponge bathe, laundry, some meal prep if needed. Pt currently approved for 16 hours/week through the [employer] Hand: Right    Extremity/Trunk Assessment   Upper Extremity Assessment Upper Extremity Assessment: Defer to OT evaluation    Lower  Extremity Assessment Lower Extremity Assessment: Generalized weakness (able to perform knee flexion/extension, SLR with lift assist)    Cervical / Trunk Assessment Cervical / Trunk Assessment: Kyphotic  Communication   Communication: No difficulties  Cognition Arousal/Alertness: Awake/alert Behavior During Therapy: WFL for tasks assessed/performed (irritable, defensive) Overall Cognitive Status: Impaired/Different from baseline Area of Impairment: Memory;Attention;Following commands;Safety/judgement;Problem solving                   Current Attention Level: Sustained   Following Commands: Follows one step commands with increased time Safety/Judgement: Decreased awareness of deficits;Decreased awareness of safety   Problem Solving: Difficulty sequencing;Decreased initiation;Requires verbal cues;Requires tactile cues;Slow processing General Comments: oriented to self, location, and year. Lacks insight into deficits, is irritable and resistant to PT/OT motivation to progress mobility. On multiple occasions, pt asks PT or OT to wait so he can focus on "one thing at a time". Pt states PT and OT are trying to "intimidate" him when encouraging mobility.      General Comments General comments (skin integrity, edema, etc.): SpO42mn 90% on 4LO2 during mobility, other VSS    Exercises     Assessment/Plan    PT Assessment Patient needs continued PT services  PT Problem List Decreased strength;Decreased mobility;Decreased activity tolerance;Decreased balance;Decreased knowledge of use of DME;Pain;Decreased safety awareness;Decreased knowledge of precautions;Obesity;Cardiopulmonary status limiting activity;Decreased cognition;Impaired sensation       PT Treatment Interventions DME instruction;Therapeutic activities;Gait training;Therapeutic exercise;Patient/family education;Balance training;Functional mobility training;Neuromuscular re-education;Wheelchair mobility training    PT Goals  (Current goals can be found in the Care Plan section)  Acute Rehab PT Goals Patient Stated Goal: get to home level PT Goal Formulation: With patient Time For Goal Achievement: 11/27/20 Potential to Achieve Goals: Fair    Frequency Min 3X/week   Barriers to discharge        Co-evaluation PT/OT/SLP Co-Evaluation/Treatment: Yes Reason for Co-Treatment: For patient/therapist safety;To address functional/ADL transfers;Complexity of the patient's impairments (multi-system involvement) PT goals addressed during session: Mobility/safety with mobility;Balance         AM-PAC PT "6 Clicks" Mobility  Outcome Measure Help needed turning from your back to your side while in a flat bed without using bedrails?: A Lot Help needed moving from lying on your back to sitting on the side of a flat bed without using bedrails?: A Lot Help needed moving to and from a bed to a chair (including a wheelchair)?: Total Help needed standing up from a chair using your arms (e.g., wheelchair or bedside chair)?: Total Help needed to walk in hospital room?: Total Help needed climbing 3-5 steps with a railing? : Total 6 Click Score: 8    End of Session Equipment Utilized During Treatment: Gait belt Activity Tolerance: Patient limited by fatigue;Patient limited by pain Patient left: in bed;with call bell/phone within reach;with family/visitor present (personal aide at bedside, unable to set bed alarm due to bed malfunction, RN notified) Nurse Communication: Mobility status PT Visit Diagnosis: Other abnormalities of gait and mobility (R26.89);Muscle weakness (generalized) (M62.81)    Time: [id number] Time Calculation (min) (ACUTE ONLY): 40 min   Charges:   PT Evaluation $PT Eval Low Complexity:  1 Low PT Treatments $Therapeutic Activity: 8-22 mins        Stacie Glaze, PT DPT Acute Rehabilitation Services Pager [phone]  Office [phone]   Louis Matte 11/13/2020, 12:26 PM

## 2020-11-13 NOTE — Progress Notes (Signed)
   VASCULAR SURGERY ASSESSMENT & PLAN:   POD 1 LEFT BKA: His dressing is dry.  Dressing change tomorrow.  DVT PROPHYLAXIS: He is on IV heparin for A. fib and from my standpoint can resume his Eliquis tomorrow.  ID: From my standpoint he should complete 48 hours of antibiotics postop.  SUBJECTIVE:   He is very anxious this morning.  He is concerned about his blood sugars.  PHYSICAL EXAM:   Vitals:   11/25/2020 1819 11/21/2020 2056 11/13/20 0038 11/13/20 0106  BP: (!) 145/61 (!) 135/59  (!) 155/91  Pulse: 78 69 72 64  Resp:  18 (!) 26 20  Temp:  98.4 F (36.9 C)    TempSrc:  Oral    SpO2:  93% 94% 100%  Weight:      Height:       His dressing is dry.  LABS:   Lab Results  Component Value Date   WBC 24.7 (H) 11/13/2020   HGB 10.6 (L) 11/13/2020   HCT 33.6 (L) 11/13/2020   MCV 94.6 11/13/2020   PLT 345 11/13/2020   Lab Results  Component Value Date   CREATININE 2.07 (H) 11/13/2020   Lab Results  Component Value Date   INR 2.0 05/24/2019   CBG (last 3)  Recent Labs    11/11/2020 1815 11/07/2020 2028 11/13/20 0132  GLUCAP 349* 304* 220*    PROBLEM LIST:    Principal Problem:   Diabetic infection of left foot ([place]) Active Problems:   Diabetes mellitus (HCC)   CKD (chronic kidney disease) stage 3, GFR 30-59 ml/min (HCC)   Hx of right BKA (HCC)   AKI (acute kidney injury) ([place])   CURRENT MEDS:    (feeding supplement) PROSource Plus  30 mL Oral TID BM   allopurinol  100 mg Oral Daily   amLODipine  5 mg Oral Daily   cholecalciferol  5,000 Units Oral Daily   docusate sodium  100 mg Oral Daily   insulin pump   Subcutaneous TID WC, HS, 0200   ipratropium-albuterol  3 mL Nebulization STAT   metoprolol tartrate  25 mg Oral BID   metroNIDAZOLE  500 mg Oral Q12H   multivitamin with minerals  1 tablet Oral Daily   pantoprazole  40 mg Oral Daily   pravastatin  40 mg Oral q1800    Deitra Mayo Office: [phone] 11/13/2020

## 2020-11-13 NOTE — Progress Notes (Signed)
ANTICOAGULATION CONSULT NOTE - Consult  Pharmacy Consult for Heparin Indication: atrial fibrillation and history of DVT  Allergies  Allergen Reactions   Prednisone Anaphylaxis and Shortness Of Breath   Dulaglutide Nausea Only   Atorvastatin    Lovastatin     Other reaction(s): OTHER REACTION    Patient Measurements: Height: '5\' 10"'$  (177.8 cm) Weight: 117.9 kg (259 lb 14.8 oz) IBW/kg (Calculated) : 73 kg Heparin Dosing Weight: 99.2 kg  Vital Signs: Temp: 97.9 F (36.6 C) (09/07 0752) Temp Source: Oral (09/07 0752) BP: 104/61 (09/07 0752) Pulse Rate: 61 (09/07 0752)  Labs: Recent Labs    11/10/20 1439 11/11/20 0203 11/11/20 [id number] 11/11/20 [id number] 11/11/20 2235 11/11/20 2236 12/01/2020 1833 11/20/2020 2059 11/13/20 0408 11/13/20 1243  HGB  --   --  11.0*   < >  --  12.0*  --   --  10.6*  --   HCT  --   --  34.1*  --   --  37.1*  --   --  33.6*  --   PLT  --   --  320  --   --  297  --   --  345  --   APTT 38*  --  53*  --   --   --   --   --   --   --   HEPARINUNFRC  --   --  0.19*   < > 0.28*  --   --   --  0.20* 0.31  CREATININE  --  1.85*  --   --   --   --  1.77*  --  2.07*  --   TROPONINIHS  --   --   --   --   --   --   --  20* 20*  --    < > = values in this interval not displayed.    Estimated Creatinine Clearance: 36.6 mL/min (A) (by C-G formula based on SCr of 2.07 mg/dL (H)).   Medical History: Past Medical History:  Diagnosis Date   Arthritis Sept. 2015   Rh. Neck and Upper Back   Arthritis Sept. 2015   Gout-Right Hand  Left knee   Cancer ([place])    8 wks Radiation   Chronic kidney disease    Diabetes mellitus    ED (erectile dysfunction)    Hx of agent Orange exposure    while [place] in [place] during that conflict   Hyperlipidemia    Hypertension    PVD (peripheral vascular disease) ([place])    PVT (paroxysmal ventricular tachycardia) (HCC)    S/P BKA (below knee amputation) [facility])     Assessment: 80 yo male presents with possible  osteomyelitis. PTA the patient is on apixaban for atrial fibrillation (last dose 9/01). The patient also reports a history of DVT that was >10 years ago and was treated with warfarin.   Patient was transitioned to IV heparin while inpatient and held 9/06 AM prior to surgery. The patient is now s/p L below knee amputation. Pharmacy consulted to resume IV heparin hours post op.   Heparin was restarted 9/06 at 2000 units/hr 9/06. Heparin level subtherapeutic 0.2 > heparin increased to 2150 units/hr. Current heparin level therapeutic at 0.31. Will increase slightly to avoid becoming subtherapetic  H/H mildly low. Plt wnl   Goal of Therapy:  Heparin level 0.3-0.5 units/ml Monitor platelets by anticoagulation protocol: Yes   Plan:  - Increase heparin IV at 2200 units/hr -  Monitor daily heparin level and CBC - Watch for signs and symptoms of bleeding   Thank you for allowing pharmacy to be a part of this patient's care.  Ardyth Harps, PharmD Clinical Pharmacist

## 2020-11-13 NOTE — Care Management Important Message (Signed)
Important Message  Patient Details  Name: Patrick Shelton MRN: [id number] Date of Birth: 11-16-1940   Medicare Important Message Given:  Yes     Shelda Altes 11/13/2020, 9:38 AM

## 2020-11-13 NOTE — Progress Notes (Signed)
Pt arrived to unit 0040.  Pt on BIPAP. Insulin pump due but no sliding scale on MAR. Pt not alert enough to admin pump. BS 220. PT HR drop to 44-59. Notified provider. Provider order to hold Metoprolol and will order sliding scale insulin Q6 hrs.

## 2020-11-13 NOTE — Progress Notes (Signed)
Patient resting comfortably with no respiratory distress noted. BIPAP not needed at this time. RT will monitor as needed.

## 2020-11-13 NOTE — Progress Notes (Signed)
Pt transferred from 5N placed on BIPAP when arrived.  Patient tolerating well at this time.

## 2020-11-13 NOTE — Progress Notes (Signed)
ANTICOAGULATION CONSULT NOTE - Follow Up Consult  Pharmacy Consult for Heparin Indication: atrial fibrillation and history of DVT  Allergies  Allergen Reactions   Prednisone Anaphylaxis and Shortness Of Breath   Dulaglutide Nausea Only   Atorvastatin    Lovastatin     Other reaction(s): OTHER REACTION    Patient Measurements: Height: '5\' 10"'$  (177.8 cm) Weight: 117.9 kg (259 lb 14.8 oz) IBW/kg (Calculated) : 73 kg Heparin Dosing Weight: 99.2 kg  Vital Signs: Temp: 98.4 F (36.9 C) (09/06 2056) Temp Source: Oral (09/06 2056) BP: 155/91 (09/07 0106) Pulse Rate: 64 (09/07 0106)  Labs: Recent Labs    11/10/20 0517 11/10/20 1439 11/11/20 0203 11/11/20 [id number] 11/11/20 2235 11/11/20 2236 11/10/2020 1833 11/19/2020 2059 11/13/20 0408  HGB 11.2*  --   --  11.0*  --  12.0*  --   --  10.6*  HCT 34.2*  --   --  34.1*  --  37.1*  --   --  33.6*  PLT 273  --   --  320  --  297  --   --  345  APTT 27 38*  --  53*  --   --   --   --   --   HEPARINUNFRC 0.28*  --   --  0.19* 0.28*  --   --   --  0.20*  CREATININE 2.18*  --  1.85*  --   --   --  1.77*  --   --   TROPONINIHS  --   --   --   --   --   --   --  20*  --      Estimated Creatinine Clearance: 42.8 mL/min (A) (by C-G formula based on SCr of 1.77 mg/dL (H)).   Medical History: Past Medical History:  Diagnosis Date   Arthritis Sept. 2015   Rh. Neck and Upper Back   Arthritis Sept. 2015   Gout-Right Hand  Left knee   Cancer ([place])    8 wks Radiation   Chronic kidney disease    Diabetes mellitus    ED (erectile dysfunction)    Hx of agent Orange exposure    while [place] in [place] during that conflict   Hyperlipidemia    Hypertension    PVD (peripheral vascular disease) ([place])    PVT (paroxysmal ventricular tachycardia) (HCC)    S/P BKA (below knee amputation) [facility])     Assessment: 80 yo male presents with possible osteomyelitis. PTA the patient is on apixaban for atrial fibrillation (last dose 9/1 @ 0543). The  patient also reports a history of DVT that was >10 years ago and was treated with warfarin. He was transitioned to heparin in the hospital and this was held this morning prior to surgery. The patient is now s/p L below knee amputation - heparin resumed 9/5 1700.  Heparin level subtherapeutic (0.2) on gtt at 2000 units/hr. No issues with line or bleeding reported per RN. Hgb down to 10.6, plt wnl.  Goal of Therapy:  Heparin level 0.3-0.5 units/ml Monitor platelets by anticoagulation protocol: Yes   Plan:  Increase heparin IV to 2150 units/hr Will f/u 8 hr heparin level  Sherlon Handing, PharmD, BCPS Please see amion for complete clinical pharmacist phone list 11/13/2020 4:51 AM

## 2020-11-13 NOTE — Progress Notes (Signed)
[facility] PROGRESS NOTE    Progress Note  Patrick Shelton  [id number] DOB: Jul 10, 1940 DOA: 11/02/2020 PCP: Lorne Skeens, MD     Brief Narrative:   Patrick Shelton is an 80 y.o. 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  Was sent to the ED for concern of bacteremia apparently had subjective fevers at home.  X-ray showing change from previous.  Started on IV antibiotics vascular surgery was consulted Eliquis was held.   Significant studies: 11/02/2020 blood cultures have been negative till date.  Antibiotics: 11/01/2020 IV vancomycin 11/07/2020 IV Rocephin 9 03/28/2018 IV Flagyl until 11/11/2020  Microbiology data: Blood culture:  Procedures: 11/19/2020 left below the knee amputation  Assessment/Plan:   Gangrenous left fourth toe in the setting of  Diabetic infection of left foot [facility]): Anticipate patient will need antibiotics postsurgical hopefully not more than 48 hours. 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 He was restarted back on IV heparin to start Eliquis on 11/14/2020.  Acute kidney injury on chronic kidney disease stage IIIb/hyponatremia: With a baseline creatinine of 1.5-2 Acute kidney injury likely secondary to infectious etiology and hypovolemia ARB and hydrochlorothiazide were held. His creatinine has returned to baseline.  Insulin-dependent diabetes mellitus type 2: On a pump at home, was kept during his hospital stay he became hypoglycemic (due to poor oral intake) started on D5.  His insulin pump was stopped for several hours for unknown reasons earlier in the day on 11/20/2020 and his blood sugar went up to the  400s. Basic metabolic panel showed no anion gap and mild rise in his potassium see below for the details  Hyperkalemia: He was given diuretics as there was a concern for Pulmonary edema.  There was a mild rise in his creatinine is now 2.  Normocytic anemia: Likely due to chronic renal disease no signs of overt bleeding continue to monitor intermittently.  Essential hypertension: Continue amlodipine occasionally he has been having high readings in his blood pressure question due to pain.  Permanent atrial fibrillation: Unknown IV nodal blocking agents, holding Eliquis currently on IV heparin, Eliquis to be started on 11/14/2020.  Morbid obesity: Noted   DVT prophylaxis: heparin change to Eliquis on 11/14/2020 Family Communication:none Status is: Inpatient  Remains inpatient appropriate because:Hemodynamically unstable  Dispo:  Patient From: Home  Planned Disposition: Home with Health Care Svc  Medically stable for discharge: No  Code Status:     Code Status Orders  (From admission, onward)           Start     Ordered   11/07/20 0132  Full code  Continuous        11/07/20 0135           Code Status History     This patient has a current code status but no historical code status.         IV Access:   Peripheral IV   Procedures and diagnostic studies:   CT HEAD WO CONTRAST (5MM)  Result Date: 11/13/2020 CLINICAL DATA:  Delirium. EXAM: CT HEAD WITHOUT CONTRAST TECHNIQUE: Contiguous axial images were obtained from the base of the skull through the vertex without intravenous contrast. COMPARISON:  None. FINDINGS: Brain: Age related cerebral atrophy, ventriculomegaly  and periventricular white matter disease. Moderate-sized left middle cranial fossa cyst is noted. No CT findings for acute hemispheric infarction or intracranial hemorrhage. No mass lesions. The brainstem and cerebellum are normal. Vascular: Advanced vascular calcifications but no aneurysm or  hyperdense vessels. Skull: No skull fracture or bone lesions. Sinuses/Orbits: The paranasal sinuses and mastoid air cells are clear. The globes are intact. Other: No scalp lesions or scalp hematoma. IMPRESSION: 1. Age related cerebral atrophy, ventriculomegaly and periventricular white matter disease. 2. Left middle cranial fossa cyst. 3. No acute intracranial findings or mass lesions. Electronically Signed   By: Marijo Sanes M.D.   On: 11/13/2020 06:02   DG CHEST PORT 1 VIEW  Result Date: 11/30/2020 CLINICAL DATA:  Sudden onset shortness of breath, hypoxia EXAM: PORTABLE CHEST 1 VIEW COMPARISON:  11/11/2020 FINDINGS: Cardiomegaly. Low lung volumes. Vascular congestion and bilateral airspace opacities in the perihilar and lower lobe regions, likely edema. Suspect small layering effusions. No acute bony abnormality. Aortic atherosclerosis. IMPRESSION: Low lung volumes. Cardiomegaly.  Suspect mild CHF. Small bilateral layering effusions. Electronically Signed   By: Rolm Baptise M.D.   On: 11/15/2020 19:11   DG CHEST PORT 1 VIEW  Result Date: 11/11/2020 CLINICAL DATA:  Preop for interpretation surgery tomorrow. EXAM: PORTABLE CHEST 1 VIEW COMPARISON:  Two view chest x-ray 10/12/2020 FINDINGS: Heart is enlarged. Lung volumes remain low. Atherosclerotic changes are present at the aortic arch. There is significant airspace disease is present. Aeration of the lung bases is improved. IMPRESSION: Improved aeration of the lung bases bilaterally. Electronically Signed   By: San Morelle M.D.   On: 11/11/2020 13:55     Medical Consultants:   None.   Subjective:    Patrick Shelton no complaints has not had his breakfast this morning.  Objective:    Vitals:   11/13/20 0038 11/13/20 0106 11/13/20 0743 11/13/20 0752  BP:  (!) 155/91  104/61  Pulse: 72 64  61  Resp: (!) '26 20  20  '$ Temp:    97.9 F (36.6 C)  TempSrc:    Oral  SpO2: 94% 100% 96% 97%  Weight:      Height:       SpO2: 97 % O2  Flow Rate (L/min): 4 L/min FiO2 (%): 36 %   Intake/Output Summary (Last 24 hours) at 11/13/2020 0937 Last data filed at 11/09/2020 1300 Gross per 24 hour  Intake 120 ml  Output --  Net 120 ml   Filed Weights   10/23/2020 1954 11/10/20 1900  Weight: 117.9 kg 117.9 kg    Exam: General exam: In no acute distress. Respiratory system: Good air movement and clear to auscultation. Cardiovascular system: S1 & S2 heard, RRR. No JVD. Gastrointestinal system: Abdomen is nondistended, soft and nontender.  Extremities: bilateral below the knee amputation. Skin: No rashes, lesions or ulcers Psychiatry: Judgement and insight appear normal. Mood & affect appropriate.    Data Reviewed:    Labs: Basic Metabolic Panel: Recent Labs  Lab 11/09/20 0159 11/10/20 0517 11/11/20 0203 11/13/2020 1833 11/13/20 0408  NA 135 135 135 135 138  K 4.3 4.4 4.9 5.9* 6.0*  CL 103 103 104 103 106  CO2 25 24 20* 22 23  GLUCOSE 57* 112* 139* 387* 210*  BUN 51* 52* 47* 39* 46*  CREATININE 2.32* 2.18* 1.85* 1.77* 2.07*  CALCIUM 8.6* 8.6* 8.8* 8.7* 9.0   GFR Estimated Creatinine Clearance: 36.6 mL/min (A) (by C-G formula based on SCr of 2.07 mg/dL (H)). Liver Function Tests:  Recent Labs  Lab 11/05/2020 2031  AST 31  ALT 31  ALKPHOS 74  BILITOT 0.2*  PROT 7.6  ALBUMIN 2.6*   No results for input(s): LIPASE, AMYLASE in the last 168 hours. No results for input(s): AMMONIA in the last 168 hours. Coagulation profile No results for input(s): INR, PROTIME in the last 168 hours. COVID-19 Labs  No results for input(s): DDIMER, FERRITIN, LDH, CRP in the last 72 hours.  Lab Results  Component Value Date   [place] NEGATIVE 10/10/2020    CBC: Recent Labs  Lab 10/12/2020 2031 11/07/20 0414 11/09/20 0159 11/10/20 0517 11/11/20 0649 11/11/20 2236 11/13/20 0408  WBC 20.0*   < > 26.2* 20.7* 20.1* 19.5* 24.7*  NEUTROABS 14.8*  --   --   --   --   --   --   HGB 12.3*   < > 10.8* 11.2* 11.0* 12.0* 10.6*   HCT 37.0*   < > 33.4* 34.2* 34.1* 37.1* 33.6*  MCV 92.5   < > 92.5 92.4 92.4 94.2 94.6  PLT 280   < > 306 273 320 297 345   < > = values in this interval not displayed.   Cardiac Enzymes: No results for input(s): CKTOTAL, CKMB, CKMBINDEX, TROPONINI in the last 168 hours. BNP (last 3 results) No results for input(s): PROBNP in the last 8760 hours. CBG: Recent Labs  Lab 11/08/2020 1612 12/04/2020 1815 11/30/2020 2028 11/13/20 0132 11/13/20 0646  GLUCAP 365* 349* 304* 220* 127*   D-Dimer: No results for input(s): DDIMER in the last 72 hours. Hgb A1c: Recent Labs    11/13/20 0408  HGBA1C 7.2*   Lipid Profile: No results for input(s): CHOL, HDL, LDLCALC, TRIG, CHOLHDL, LDLDIRECT in the last 72 hours. Thyroid function studies: No results for input(s): TSH, T4TOTAL, T3FREE, THYROIDAB in the last 72 hours.  Invalid input(s): FREET3 Anemia work up: No results for input(s): VITAMINB12, FOLATE, FERRITIN, TIBC, IRON, RETICCTPCT in the last 72 hours. Sepsis Labs: Recent Labs  Lab 10/26/2020 2032 11/07/20 0140 11/07/20 0414 11/10/20 0517 11/11/20 0649 11/11/20 2236 11/13/20 0408  WBC  --   --    < > 20.7* 20.1* 19.5* 24.7*  LATICACIDVEN 1.4 1.3  --   --   --   --   --    < > = values in this interval not displayed.   Microbiology Recent Results (from the past 240 hour(s))  Resp Panel by RT-PCR (Flu A&B, Covid) Nasopharyngeal Swab     Status: None   Collection Time: 11/05/2020  7:59 PM   Specimen: Nasopharyngeal Swab; Nasopharyngeal(NP) swabs in vial transport medium  Result Value Ref Range Status   SARS Coronavirus 2 by RT PCR NEGATIVE NEGATIVE Final    Comment: (NOTE) SARS-CoV-2 target nucleic acids are NOT DETECTED.  The SARS-CoV-2 RNA is generally detectable in upper respiratory specimens during the acute phase of infection. The lowest concentration of SARS-CoV-2 viral copies this assay can detect is 138 copies/mL. A negative result does not preclude SARS-Cov-2 infection  and should not be used as the sole basis for treatment or other patient management decisions. A negative result may occur with  improper specimen collection/handling, submission of specimen other than nasopharyngeal swab, presence of viral mutation(s) within the areas targeted by this assay, and inadequate number of viral copies(<138 copies/mL). A negative result must be combined with clinical observations, patient history, and epidemiological information. The expected result is Negative.  Fact Sheet for Patients:  EntrepreneurPulse.com.au  Fact Sheet for Healthcare  Providers:  IncredibleEmployment.be  This test is no t yet approved or cleared by the [place] and  has been authorized for detection and/or diagnosis of SARS-CoV-2 by FDA under an Emergency Use Authorization (EUA). This EUA will remain  in effect (meaning this test can be used) for the duration of the COVID-19 declaration under Section 564(b)(1) of the Act, 21 U.S.C.section 360bbb-3(b)(1), unless the authorization is terminated  or revoked sooner.       Influenza A by PCR NEGATIVE NEGATIVE Final   Influenza B by PCR NEGATIVE NEGATIVE Final    Comment: (NOTE) The Xpert Xpress SARS-CoV-2/FLU/RSV plus assay is intended as an aid in the diagnosis of influenza from Nasopharyngeal swab specimens and should not be used as a sole basis for treatment. Nasal washings and aspirates are unacceptable for Xpert Xpress SARS-CoV-2/FLU/RSV testing.  Fact Sheet for Patients: EntrepreneurPulse.com.au  Fact Sheet for Healthcare Providers: IncredibleEmployment.be  This test is not yet approved or cleared by the [place] FDA and has been authorized for detection and/or diagnosis of SARS-CoV-2 by FDA under an Emergency Use Authorization (EUA). This EUA will remain in effect (meaning this test can be used) for the duration of the COVID-19 declaration  under Section 564(b)(1) of the Act, 21 U.S.C. section 360bbb-3(b)(1), unless the authorization is terminated or revoked.  Performed at [facility], [place] [address], [place], [place] [address]   Blood culture (routine x 2)     Status: None   Collection Time: 10/27/2020  8:15 PM   Specimen: BLOOD RIGHT HAND  Result Value Ref Range Status   Specimen Description BLOOD RIGHT HAND  Final   Special Requests   Final    BOTTLES DRAWN AEROBIC AND ANAEROBIC Blood Culture results may not be optimal due to an excessive volume of blood received in culture bottles   Culture   Final    NO GROWTH 5 DAYS Performed at [facility], [place] [address] [place], [place] [address]    Report Status 11/11/2020 FINAL  Final  Blood culture (routine x 2)     Status: None   Collection Time: 10/31/2020  8:32 PM   Specimen: BLOOD LEFT ARM  Result Value Ref Range Status   Specimen Description BLOOD LEFT ARM  Final   Special Requests   Final    BOTTLES DRAWN AEROBIC ONLY Blood Culture adequate volume   Culture   Final    NO GROWTH 5 DAYS Performed at [facility], [place] [address], [place], [place] [address]    Report Status 11/11/2020 FINAL  Final  MRSA Next Gen by PCR, Nasal     Status: None   Collection Time: 11/07/20  3:29 AM   Specimen: Nasal Mucosa; Nasal Swab  Result Value Ref Range Status   MRSA by PCR Next Gen NOT DETECTED NOT DETECTED Final    Comment: (NOTE) The GeneXpert MRSA Assay (FDA approved for NASAL specimens only), is one component of a comprehensive MRSA colonization surveillance program. It is not intended to diagnose MRSA infection nor to guide or monitor treatment for MRSA infections. Test performance is not FDA approved in patients less than 90 years old. Performed at [facility], [place] [address], [place], [place] [address]      Medications:    (feeding supplement) PROSource Plus  30 mL Oral TID BM   allopurinol  100 mg Oral Daily   amLODipine  5 mg Oral  Daily   cholecalciferol  5,000 Units  Oral Daily   docusate sodium  100 mg Oral Daily   insulin pump   Subcutaneous TID WC, HS, 0200   ipratropium-albuterol  3 mL Nebulization STAT   metoprolol tartrate  25 mg Oral BID   metroNIDAZOLE  500 mg Oral Q12H   multivitamin with minerals  1 tablet Oral Daily   pantoprazole  40 mg Oral Daily   pravastatin  40 mg Oral q1800   Continuous Infusions:  sodium chloride 50 mL/hr at 12/02/2020 2226   cefTRIAXone (ROCEPHIN)  IV Stopped (11/11/20 1121)   heparin 2,150 Units/hr (11/13/20 0547)   magnesium sulfate bolus IVPB     vancomycin 1,250 mg (11/16/2020 1620)      LOS: 6 days   Charlynne Cousins  [facility]  11/13/2020, 9:37 AM

## 2020-11-14 ENCOUNTER — Inpatient Hospital Stay (HOSPITAL_COMMUNITY): Payer: Medicare Other

## 2020-11-14 DIAGNOSIS — N183 Chronic kidney disease, stage 3 unspecified: Secondary | ICD-10-CM | POA: Diagnosis not present

## 2020-11-14 DIAGNOSIS — E11628 Type 2 diabetes mellitus with other skin complications: Secondary | ICD-10-CM | POA: Diagnosis not present

## 2020-11-14 DIAGNOSIS — Z89511 Acquired absence of right leg below knee: Secondary | ICD-10-CM | POA: Diagnosis not present

## 2020-11-14 DIAGNOSIS — N179 Acute kidney failure, unspecified: Secondary | ICD-10-CM | POA: Diagnosis not present

## 2020-11-14 LAB — CBC
HCT: 36 % — ABNORMAL LOW (ref 39.0–52.0)
Hemoglobin: 11.1 g/dL — ABNORMAL LOW (ref 13.0–17.0)
MCH: 30 pg (ref 26.0–34.0)
MCHC: 30.8 g/dL (ref 30.0–36.0)
MCV: 97.3 fL (ref 80.0–100.0)
Platelets: 248 10*3/uL (ref 150–400)
RBC: 3.7 MIL/uL — ABNORMAL LOW (ref 4.22–5.81)
RDW: 14.7 % (ref 11.5–15.5)
WBC: 25.3 10*3/uL — ABNORMAL HIGH (ref 4.0–10.5)
nRBC: 0.1 % (ref 0.0–0.2)

## 2020-11-14 LAB — BASIC METABOLIC PANEL
Anion gap: 11 (ref 5–15)
Anion gap: 7 (ref 5–15)
BUN: 52 mg/dL — ABNORMAL HIGH (ref 8–23)
BUN: 48 mg/dL — ABNORMAL HIGH (ref 8–23)
CO2: 22 mmol/L (ref 22–32)
CO2: 26 mmol/L (ref 22–32)
Calcium: 8.8 mg/dL — ABNORMAL LOW (ref 8.9–10.3)
Calcium: 8.5 mg/dL — ABNORMAL LOW (ref 8.9–10.3)
Chloride: 109 mmol/L (ref 98–111)
Chloride: 107 mmol/L (ref 98–111)
Creatinine, Ser: 2.27 mg/dL — ABNORMAL HIGH (ref 0.61–1.24)
Creatinine, Ser: 2.11 mg/dL — ABNORMAL HIGH (ref 0.61–1.24)
GFR, Estimated: 28 mL/min — ABNORMAL LOW (ref >60)
GFR, Estimated: 31 mL/min — ABNORMAL LOW (ref >60)
Glucose, Bld: 59 mg/dL — ABNORMAL LOW (ref 70–99)
Glucose, Bld: 233 mg/dL — ABNORMAL HIGH (ref 70–99)
Potassium: 6 mmol/L — ABNORMAL HIGH (ref 3.5–5.1)
Potassium: 5 mmol/L (ref 3.5–5.1)
Sodium: 142 mmol/L (ref 135–145)
Sodium: 140 mmol/L (ref 135–145)

## 2020-11-14 LAB — BLOOD GAS, ARTERIAL
Acid-base deficit: 2.1 mmol/L — ABNORMAL HIGH (ref 0.0–2.0)
Bicarbonate: 24.4 mmol/L (ref 20.0–28.0)
FIO2: 40
O2 Saturation: 97.4 %
Patient temperature: 36.7
pCO2 arterial: 58.6 mmHg — ABNORMAL HIGH (ref 32.0–48.0)
pH, Arterial: 7.24 — ABNORMAL LOW (ref 7.350–7.450)
pO2, Arterial: 95.6 mmHg (ref 83.0–108.0)

## 2020-11-14 LAB — GLUCOSE, CAPILLARY
Glucose-Capillary: 76 mg/dL (ref 70–99)
Glucose-Capillary: 58 mg/dL — ABNORMAL LOW (ref 70–99)
Glucose-Capillary: 89 mg/dL (ref 70–99)
Glucose-Capillary: 64 mg/dL — ABNORMAL LOW (ref 70–99)
Glucose-Capillary: 153 mg/dL — ABNORMAL HIGH (ref 70–99)
Glucose-Capillary: 236 mg/dL — ABNORMAL HIGH (ref 70–99)
Glucose-Capillary: 246 mg/dL — ABNORMAL HIGH (ref 70–99)
Glucose-Capillary: 274 mg/dL — ABNORMAL HIGH (ref 70–99)

## 2020-11-14 LAB — HEPARIN LEVEL (UNFRACTIONATED): Heparin Unfractionated: 0.84 IU/mL — ABNORMAL HIGH (ref 0.30–0.70)

## 2020-11-14 LAB — BRAIN NATRIURETIC PEPTIDE: B Natriuretic Peptide: 272.2 pg/mL — ABNORMAL HIGH (ref 0.0–100.0)

## 2020-11-14 MED ORDER — INSULIN ASPART 100 UNIT/ML IJ SOLN
0.0000 [IU] | Freq: Three times a day (TID) | INTRAMUSCULAR | Status: DC
  Administered 2020-11-14: 3 [IU] via SUBCUTANEOUS

## 2020-11-14 MED ORDER — FUROSEMIDE 10 MG/ML IJ SOLN
40.0000 mg | Freq: Once | INTRAMUSCULAR | Status: AC
  Administered 2020-11-14: 40 mg via INTRAVENOUS
  Filled 2020-11-14: qty 4

## 2020-11-14 MED ORDER — INSULIN ASPART 100 UNIT/ML IJ SOLN: 0.0000 [IU] | Freq: Every day | INTRAMUSCULAR | Status: DC

## 2020-11-14 MED ORDER — CHLORHEXIDINE GLUCONATE 0.12 % MT SOLN
15.0000 mL | Freq: Two times a day (BID) | OROMUCOSAL | Status: DC
  Administered 2020-11-15: 15 mL via OROMUCOSAL
  Filled 2020-11-14 (×2): qty 15

## 2020-11-14 MED ORDER — SODIUM ZIRCONIUM CYCLOSILICATE 10 G PO PACK
10.0000 g | PACK | Freq: Two times a day (BID) | ORAL | Status: DC
  Administered 2020-11-14: 10 g via ORAL
  Filled 2020-11-14 (×2): qty 1

## 2020-11-14 MED ORDER — SODIUM CHLORIDE 0.9 % IV SOLN: INTRAVENOUS | Status: DC

## 2020-11-14 MED ORDER — INSULIN ASPART 100 UNIT/ML IJ SOLN: 2.0000 [IU] | Freq: Three times a day (TID) | INTRAMUSCULAR | Status: DC

## 2020-11-14 MED ORDER — DEXTROSE 50 % IV SOLN
12.5000 g | INTRAVENOUS | Status: AC
  Administered 2020-11-14: 12.5 g via INTRAVENOUS

## 2020-11-14 MED ORDER — INSULIN DETEMIR 100 UNIT/ML ~~LOC~~ SOLN
15.0000 [IU] | Freq: Two times a day (BID) | SUBCUTANEOUS | Status: DC
  Administered 2020-11-14: 15 [IU] via SUBCUTANEOUS
  Filled 2020-11-14 (×3): qty 0.15

## 2020-11-14 MED ORDER — ORAL CARE MOUTH RINSE
15.0000 mL | Freq: Two times a day (BID) | OROMUCOSAL | Status: DC
  Administered 2020-11-15 (×2): 15 mL via OROMUCOSAL

## 2020-11-14 MED ORDER — APIXABAN 2.5 MG PO TABS
2.5000 mg | ORAL_TABLET | Freq: Two times a day (BID) | ORAL | Status: DC
  Administered 2020-11-14 (×2): 2.5 mg via ORAL
  Filled 2020-11-14 (×2): qty 1

## 2020-11-14 MED ORDER — DEXTROSE 50 % IV SOLN
INTRAVENOUS | Status: AC
  Filled 2020-11-14: qty 50

## 2020-11-14 NOTE — Progress Notes (Signed)
Hypoglycemic Event  CBG: 64  Treatment: 4 oz juice/soda and patient ate breakfast  Symptoms: None  Follow-up CBG: Time:1111 CBG Result:153  Possible Reasons for Event: Inadequate meal intake and Other: insulin pump  Comments/MD notified: Attending and diabetes coordinator notified    Irene Pap

## 2020-11-14 NOTE — Progress Notes (Signed)
   VASCULAR SURGERY ASSESSMENT & PLAN:   POD 2 LEFT BKA: I changed his dressing this morning and the amputation site looks fine.  I have written for daily dressing changes.  ANTICOAGULATION: He was on Eliquis for atrial fibrillation.  He can resume this today from my standpoint.  SUBJECTIVE:   It is difficult to understand him with his BiPAP on.  He appears to be comfortable.  PHYSICAL EXAM:   Vitals:   11/14/20 0430 11/14/20 0500 11/14/20 0700 11/14/20 0741  BP:      Pulse: 68 72 68 (!) 55  Resp: (!) 23 18 (!) 30 18  Temp:      TempSrc:      SpO2: 100% 98%  97%  Weight:      Height:       Changes left below the knee amputation site dressing in the wound looks good.  LABS:   Lab Results  Component Value Date   WBC 25.3 (H) 11/14/2020   HGB 11.1 (L) 11/14/2020   HCT 36.0 (L) 11/14/2020   MCV 97.3 11/14/2020   PLT 248 11/14/2020   Lab Results  Component Value Date   CREATININE 2.27 (H) 11/14/2020   Lab Results  Component Value Date   INR 2.0 05/24/2019   CBG (last 3)  Recent Labs    11/14/20 0149 11/14/20 0444 11/14/20 0510  GLUCAP 76 58* 89    PROBLEM LIST:    Principal Problem:   Diabetic infection of left foot ([place]) Active Problems:   Diabetes mellitus (HCC)   CKD (chronic kidney disease) stage 3, GFR 30-59 ml/min (HCC)   Hx of right BKA (HCC)   AKI (acute kidney injury) ([place])   CURRENT MEDS:    (feeding supplement) PROSource Plus  30 mL Oral TID BM   allopurinol  100 mg Oral Daily   amLODipine  5 mg Oral Daily   cholecalciferol  5,000 Units Oral Daily   docusate sodium  100 mg Oral Daily   insulin pump   Subcutaneous TID WC, HS, 0200   metoprolol tartrate  25 mg Oral BID   multivitamin with minerals  1 tablet Oral Daily   pantoprazole  40 mg Oral Daily   pravastatin  40 mg Oral q1800    Deitra Mayo Office: [phone] 11/14/2020

## 2020-11-14 NOTE — Progress Notes (Signed)
[facility] PROGRESS NOTE    Progress Note  Patrick Shelton  [id number] DOB: 11-22-1940 DOA: 10/11/2020 PCP: Lorne Skeens, MD     Brief Narrative:   Patrick Shelton is an 80 y.o. 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  Was sent to the ED for concern of bacteremia apparently had subjective fevers at home.  X-ray showing change from previous.  Started on IV antibiotics vascular surgery was consulted Eliquis was held.   Significant studies: 10/21/2020 blood cultures have been negative till date.  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 Start Eliquis today 11/14/2020.  Acute kidney injury on chronic kidney disease stage IIIb/hyponatremia: With a baseline creatinine of 1.5-2 Acute kidney injury likely secondary to infectious etiology and hypovolemia ARB and hydrochlorothiazide were held. His creatinine has returned to baseline.  Insulin-dependent diabetes mellitus type 2: On a pump at home, was kept on his insulin pump during his hospital stay he became hypoglycemic (due to poor oral intake) . Had an episode of hypoglycemia overnight see below for further details. Basic metabolic panel showed no anion gap and mild rise in his potassium see below for the  details  Hyperkalemia: He was given diuretics as there was a concern for Pulmonary edema.  There was a mild rise in his creatinine is now 2. Started on Lokelma his potassium continues to be 6.0, we will go ahead and give him 2 additional doses of Lokelma. He is on no potassium supplementation but he is on metoprolol and IV heparin.  Normocytic anemia: Likely due to chronic renal disease no signs of overt bleeding continue to monitor intermittently.  Essential hypertension: Continue amlodipine occasionally he has been having high readings in his blood pressure question due to pain.  Permanent atrial fibrillation: Unknown IV nodal blocking agents, holding Eliquis currently on IV heparin, Eliquis to be started on 11/14/2020.  Morbid obesity: Noted  Acute metabolic encephalopathy: Became encephalopathic overnight likely due to hypoglycemia To have the diabetes coordinator help him with his insulin pump.   DVT prophylaxis: heparin change to Eliquis on 11/14/2020 Family Communication:none Status is: Inpatient  Remains inpatient appropriate because:Hemodynamically unstable  Dispo:  Patient From: Home  Planned Disposition: Inpatient Rehab  Medically stable for discharge: No  Code Status:     Code Status Orders  (From admission, onward)           Start     Ordered   11/07/20 0132  Full code  Continuous        11/07/20 0135           Code Status History     This patient has a current code status but no historical code status.         IV Access:   Peripheral IV   Procedures and diagnostic studies:   CT HEAD WO CONTRAST (5MM)  Result Date: 11/13/2020 CLINICAL DATA:  Delirium. EXAM: CT HEAD WITHOUT CONTRAST TECHNIQUE: Contiguous axial images were obtained from the base of the skull through the vertex without intravenous contrast. COMPARISON:  None. FINDINGS: Brain: Age related cerebral atrophy, ventriculomegaly and periventricular white matter disease.  Moderate-sized left middle cranial fossa cyst is noted. No CT findings for acute hemispheric infarction or intracranial hemorrhage. No mass lesions. The brainstem and cerebellum are normal. Vascular: Advanced vascular calcifications but no aneurysm or hyperdense vessels. Skull: No skull fracture or bone lesions. Sinuses/Orbits: The paranasal sinuses and mastoid air cells are clear. The globes are intact. Other: No scalp lesions or scalp hematoma. IMPRESSION: 1. Age related cerebral atrophy, ventriculomegaly and periventricular white matter disease. 2. Left middle cranial fossa cyst. 3. No acute intracranial findings or mass lesions. Electronically Signed   By: Marijo Sanes M.D.   On: 11/13/2020 06:02   DG CHEST PORT 1 VIEW  Result Date: 11/14/2020 CLINICAL DATA:  Confusion dyspnea. EXAM: PORTABLE CHEST 1 VIEW COMPARISON:  11/16/2020 FINDINGS: The heart is enlarged but appears stable. Prominent mediastinal and hilar contours are unchanged. Persistent pulmonary vascular congestion and possible mild interstitial edema. No large pleural effusions or pulmonary lesions. No pneumothorax. IMPRESSION: Cardiac enlargement with vascular congestion and possible mild interstitial edema. Electronically Signed   By: Marijo Sanes M.D.   On: 11/14/2020 06:49   DG CHEST PORT 1 VIEW  Result Date: 12/01/2020 CLINICAL DATA:  Sudden onset shortness of breath, hypoxia EXAM: PORTABLE CHEST 1 VIEW COMPARISON:  11/11/2020 FINDINGS: Cardiomegaly. Low lung volumes. Vascular congestion and bilateral airspace opacities in the perihilar and lower lobe regions, likely edema. Suspect small layering effusions. No acute bony abnormality. Aortic atherosclerosis. IMPRESSION: Low lung volumes. Cardiomegaly.  Suspect mild CHF. Small bilateral layering effusions. Electronically Signed   By: Rolm Baptise M.D.   On: 12/05/2020 19:11     Medical Consultants:   None.   Subjective:    Patrick Shelton awake this morning relate he is  hungry.  Objective:    Vitals:   11/14/20 0500 11/14/20 0700 11/14/20 0741 11/14/20 0823  BP:    (!) 120/57  Pulse: 72 68 (!) 55 (!) 59  Resp: 18 (!) '30 18 20  '$ Temp:      TempSrc:    Oral  SpO2: 98%  97% 98%  Weight:      Height:       SpO2: 98 % O2 Flow Rate (L/min): 4 L/min FiO2 (%): 36 %   Intake/Output Summary (Last 24 hours) at 11/14/2020 0903 Last data filed at 11/14/2020 [id number] Gross per 24 hour  Intake 2271.16 ml  Output 950 ml  Net 1321.16 ml    Filed Weights   10/26/2020 1954 11/10/20 1900  Weight: 117.9 kg 117.9 kg    Exam: General exam: In no acute distress. Respiratory system: Good air movement and clear to auscultation. Cardiovascular system: S1 & S2 heard, RRR. No JVD. Gastrointestinal system: Abdomen is nondistended, soft and nontender.  Extremities: No pedal edema. Skin: No rashes, lesions or ulcers   Data Reviewed:    Labs: Basic Metabolic Panel: Recent Labs  Lab 11/10/20 0517 11/11/20 0203 11/22/2020 1833 11/13/20 0408 11/14/20 0108  NA 135 135 135 138 142  K 4.4 4.9 5.9* 6.0* 6.0*  CL 103 104 103 106 109  CO2 24 20* '22 23 22  '$ GLUCOSE 112* 139* 387* 210* 59*  BUN 52* 47* 39* 46* 52*  CREATININE 2.18* 1.85* 1.77* 2.07* 2.27*  CALCIUM 8.6* 8.8* 8.7* 9.0 8.8*  GFR Estimated Creatinine Clearance: 33.4 mL/min (A) (by C-G formula based on SCr of 2.27 mg/dL (H)). Liver Function Tests: No results for input(s): AST, ALT, ALKPHOS, BILITOT, PROT, ALBUMIN in the last 168 hours.  No results for input(s): LIPASE, AMYLASE in the last 168 hours. No results for input(s): AMMONIA in the last 168 hours. Coagulation profile No results for input(s): INR, PROTIME in the last 168 hours. COVID-19 Labs  No results for input(s): DDIMER, FERRITIN, LDH, CRP in the last 72 hours.  Lab Results  Component Value Date   [place] NEGATIVE 10/11/2020    CBC: Recent Labs  Lab 11/10/20 0517 11/11/20 0649 11/11/20 2236 11/13/20 0408 11/14/20 0108   WBC 20.7* 20.1* 19.5* 24.7* 25.3*  HGB 11.2* 11.0* 12.0* 10.6* 11.1*  HCT 34.2* 34.1* 37.1* 33.6* 36.0*  MCV 92.4 92.4 94.2 94.6 97.3  PLT 273 320 297 345 248    Cardiac Enzymes: No results for input(s): CKTOTAL, CKMB, CKMBINDEX, TROPONINI in the last 168 hours. BNP (last 3 results) No results for input(s): PROBNP in the last 8760 hours. CBG: Recent Labs  Lab 11/13/20 1622 11/13/20 2112 11/14/20 0149 11/14/20 0444 11/14/20 0510  GLUCAP 79 101* 76 58* 89    D-Dimer: No results for input(s): DDIMER in the last 72 hours. Hgb A1c: Recent Labs    11/13/20 0408  HGBA1C 7.2*    Lipid Profile: No results for input(s): CHOL, HDL, LDLCALC, TRIG, CHOLHDL, LDLDIRECT in the last 72 hours. Thyroid function studies: No results for input(s): TSH, T4TOTAL, T3FREE, THYROIDAB in the last 72 hours.  Invalid input(s): FREET3 Anemia work up: No results for input(s): VITAMINB12, FOLATE, FERRITIN, TIBC, IRON, RETICCTPCT in the last 72 hours. Sepsis Labs: Recent Labs  Lab 11/11/20 0649 11/11/20 2236 11/13/20 0408 11/14/20 0108  WBC 20.1* 19.5* 24.7* 25.3*    Microbiology Recent Results (from the past 240 hour(s))  Resp Panel by RT-PCR (Flu A&B, Covid) Nasopharyngeal Swab     Status: None   Collection Time: 10/29/2020  7:59 PM   Specimen: Nasopharyngeal Swab; Nasopharyngeal(NP) swabs in vial transport medium  Result Value Ref Range Status   SARS Coronavirus 2 by RT PCR NEGATIVE NEGATIVE Final    Comment: (NOTE) SARS-CoV-2 target nucleic acids are NOT DETECTED.  The SARS-CoV-2 RNA is generally detectable in upper respiratory specimens during the acute phase of infection. The lowest concentration of SARS-CoV-2 viral copies this assay can detect is 138 copies/mL. A negative result does not preclude SARS-Cov-2 infection and should not be used as the sole basis for treatment or other patient management decisions. A negative result may occur with  improper specimen  collection/handling, submission of specimen other than nasopharyngeal swab, presence of viral mutation(s) within the areas targeted by this assay, and inadequate number of viral copies(<138 copies/mL). A negative result must be combined with clinical observations, patient history, and epidemiological information. The expected result is Negative.  Fact Sheet for Patients:  EntrepreneurPulse.com.au  Fact Sheet for Healthcare Providers:  IncredibleEmployment.be  This test is no t yet approved or cleared by the [place] FDA and  has been authorized for detection and/or diagnosis of SARS-CoV-2 by FDA under an Emergency Use Authorization (EUA). This EUA will remain  in effect (meaning this test can be used) for the duration of the COVID-19 declaration under Section 564(b)(1) of the Act, 21 U.S.C.section 360bbb-3(b)(1), unless the authorization is terminated  or revoked sooner.       Influenza A by PCR NEGATIVE NEGATIVE Final   Influenza B by  PCR NEGATIVE NEGATIVE Final    Comment: (NOTE) The Xpert Xpress SARS-CoV-2/FLU/RSV plus assay is intended as an aid in the diagnosis of influenza from Nasopharyngeal swab specimens and should not be used as a sole basis for treatment. Nasal washings and aspirates are unacceptable for Xpert Xpress SARS-CoV-2/FLU/RSV testing.  Fact Sheet for Patients: EntrepreneurPulse.com.au  Fact Sheet for Healthcare Providers: IncredibleEmployment.be  This test is not yet approved or cleared by the [place] FDA and has been authorized for detection and/or diagnosis of SARS-CoV-2 by FDA under an Emergency Use Authorization (EUA). This EUA will remain in effect (meaning this test can be used) for the duration of the COVID-19 declaration under Section 564(b)(1) of the Act, 21 U.S.C. section 360bbb-3(b)(1), unless the authorization is terminated or revoked.  Performed at [facility], [place] [address], [place], [place] [address]   Blood culture (routine x 2)     Status: None   Collection Time: 10/29/2020  8:15 PM   Specimen: BLOOD RIGHT HAND  Result Value Ref Range Status   Specimen Description BLOOD RIGHT HAND  Final   Special Requests   Final    BOTTLES DRAWN AEROBIC AND ANAEROBIC Blood Culture results may not be optimal due to an excessive volume of blood received in culture bottles   Culture   Final    NO GROWTH 5 DAYS Performed at [facility], [place] [address], [place], [place] [address]    Report Status 11/11/2020 FINAL  Final  Blood culture (routine x 2)     Status: None   Collection Time: 10/26/2020  8:32 PM   Specimen: BLOOD LEFT ARM  Result Value Ref Range Status   Specimen Description BLOOD LEFT ARM  Final   Special Requests   Final    BOTTLES DRAWN AEROBIC ONLY Blood Culture adequate volume   Culture   Final    NO GROWTH 5 DAYS Performed at [facility], [place] [address], [place], [place] [address]    Report Status 11/11/2020 FINAL  Final  MRSA Next Gen by PCR, Nasal     Status: None   Collection Time: 11/07/20  3:29 AM   Specimen: Nasal Mucosa; Nasal Swab  Result Value Ref Range Status   MRSA by PCR Next Gen NOT DETECTED NOT DETECTED Final    Comment: (NOTE) The GeneXpert MRSA Assay (FDA approved for NASAL specimens only), is one component of a comprehensive MRSA colonization surveillance program. It is not intended to diagnose MRSA infection nor to guide or monitor treatment for MRSA infections. Test performance is not FDA approved in patients less than 20 years old. Performed at [facility], [place] [address] [place], [place] [address]      Medications:    (feeding supplement) PROSource Plus  30 mL Oral TID BM   allopurinol  100 mg Oral Daily   amLODipine  5 mg Oral Daily   cholecalciferol  5,000 Units Oral Daily   docusate sodium  100 mg Oral Daily   insulin pump   Subcutaneous TID WC, HS, 0200   metoprolol  tartrate  25 mg Oral BID   multivitamin with minerals  1 tablet Oral Daily   pantoprazole  40 mg Oral Daily   pravastatin  40 mg Oral q1800   Continuous Infusions:  sodium chloride 50 mL/hr at 11/13/20 2108   cefTRIAXone (ROCEPHIN)  IV 2 g (11/13/20 1221)   heparin 2,000 Units/hr (11/14/20 0733)   magnesium sulfate bolus IVPB  LOS: 7 days   Charlynne Cousins  [facility]  11/14/2020, 9:03 AM

## 2020-11-14 NOTE — Progress Notes (Signed)
IP rehab admissions - If patient is medically stable tomorrow, Friday, can potentially admit to inpatient rehab.  I will follow up in am.  Call for questions.  [phone]

## 2020-11-14 NOTE — Evaluation (Signed)
Clinical/Bedside Swallow Evaluation Patient Details  Name: Patrick Shelton MRN: [id number] Date of Birth: 1940-06-15  Today's Date: 11/14/2020 Time: SLP Start Time (ACUTE ONLY): [id number] SLP Stop Time (ACUTE ONLY): [id number] SLP Time Calculation (min) (ACUTE ONLY): 16 min  Past Medical History:  Past Medical History:  Diagnosis Date   Arthritis Sept. 2015   Rh. Neck and Upper Back   Arthritis Sept. 2015   Gout-Right Hand  Left knee   Cancer ([place])    8 wks Radiation   Chronic kidney disease    Diabetes mellitus    ED (erectile dysfunction)    Hx of agent Orange exposure    while [place] in [place] during that conflict   Hyperlipidemia    Hypertension    PVD (peripheral vascular disease) ([place])    PVT (paroxysmal ventricular tachycardia) ([place])    S/P BKA (below knee amputation) [facility])    Past Surgical History:  Past Surgical History:  Procedure Laterality Date   AMPUTATION Right 2009   BKA   AMPUTATION Left 11/11/2020   Procedure: AMPUTATION BELOW KNEE;  Surgeon: Angelia Mould, MD;  Location: [facility] OR;  Service: Vascular;  Laterality: Left;   PROSTATE SURGERY     8 wks radiation   HPI:  Pt is an 80 y/o male who presented to [facility] on 8/31 with L 4th toe pain and osteomyeltis, bacteremia. Pt s/p L BKA on 9/6. CXR 9/6: Low lung volumes, cardiomegaly, mild CHF suspected by radiology, small bilateral layering effusions. Dx Acute metabolic encephalopathy thought to be secondary to hypoglycemia. SLP consulted due to pt coughing and "spitting up" after intake of thin liquids and pills. PMH: OA, gout, cancer with radiation, DM with retinopathy and neuropathy, HLD, HTN, R BKA 2009.   Assessment / Plan / Recommendation Clinical Impression  Pt was seen for bedside swallow evaluation. He denied a history of oropharyngeal dysphagia, but stated that solids and liquids "don't go down", and that he has been symptomatic for months with some worsening of symptoms. Oral mechanism exam was [facility] and  dentition was adequate. Pt's oropharyngeal swallow mechanism appeared functional. However, after intake of solids and liquids, pt demonstrated frequent, repetitive eructation followed by regurgitation and prolonged coughing. Pt stated that the boluses "won't go down" and expressed that regurgitation improved this symptom. Esophageal involvement is suspected and esophageal assessment (e.g., esophagram) is recommended to further assess the esophageal phase. Considering the severity of pt's symptoms, it is recommended that the pt be NPO until assessment is completed. Pt may have ice chips and meds crushed in puree. SLP will follow pt. SLP Visit Diagnosis: Dysphagia, unspecified (R13.10)    Aspiration Risk  Mild aspiration risk;Moderate aspiration risk    Diet Recommendation NPO except meds;Ice chips PRN after oral care (until esophageal assessment completed)   Medication Administration: Crushed with puree Supervision: Patient able to self feed    Other  Recommendations Recommended Consults: Consider esophageal assessment Oral Care Recommendations: Oral care BID   Follow up Recommendations  (TBD)      Frequency and Duration min 2x/week  2 weeks       Prognosis Prognosis for Safe Diet Advancement: Good Barriers to Reach Goals: Time post onset      Swallow Study   General Date of Onset: 11/13/20 HPI: Pt is an 80 y/o male who presented to [facility] on 8/31 with L 4th toe pain and osteomyeltis, bacteremia. Pt s/p L BKA on 9/6. CXR 9/6: Low lung volumes, cardiomegaly, mild CHF suspected by radiology, small  bilateral layering effusions. Dx Acute metabolic encephalopathy thought to be secondary to hypoglycemia. SLP consulted due to pt coughing and "spitting up" after intake of thin liquids and pills. PMH: OA, gout, cancer with radiation, DM with retinopathy and neuropathy, HLD, HTN, R BKA 2009. Type of Study: Bedside Swallow Evaluation Previous Swallow Assessment: none Diet Prior to this Study:  Regular;Thin liquids Temperature Spikes Noted: No Respiratory Status: Nasal cannula History of Recent Intubation: No Behavior/Cognition: Alert;Cooperative;Pleasant mood Oral Cavity Assessment: Within Functional Limits Oral Care Completed by SLP: No Oral Cavity - Dentition: Adequate natural dentition Vision: Functional for self-feeding Self-Feeding Abilities: Able to feed self Patient Positioning: Upright in bed;Postural control adequate for testing Baseline Vocal Quality: Normal Volitional Cough: Strong Volitional Swallow: Able to elicit    Oral/Motor/Sensory Function Overall Oral Motor/Sensory Function: Within functional limits   Ice Chips Ice chips: Within functional limits Presentation: Spoon   Thin Liquid Thin Liquid: Impaired Presentation: Straw;Cup;Self Fed Pharyngeal  Phase Impairments:  (eructation followed by coughing and regurgitation)    Nectar Thick Nectar Thick Liquid: Not tested   Honey Thick Honey Thick Liquid: Not tested   Puree Puree: Within functional limits Presentation: Spoon   Solid     Solid: Within functional limits Presentation: [place] [facility] [place], [place], [place] Office number [phone] Pager [place] 11/14/2020,2:39 PM

## 2020-11-14 NOTE — Progress Notes (Signed)
ANTICOAGULATION CONSULT NOTE - Consult  Pharmacy Consult for Heparin Indication: atrial fibrillation and history of DVT  Allergies  Allergen Reactions   Prednisone Anaphylaxis and Shortness Of Breath   Dulaglutide Nausea Only   Atorvastatin    Lovastatin     Other reaction(s): OTHER REACTION    Patient Measurements: Height: '5\' 10"'$  (177.8 cm) Weight: 117.9 kg (259 lb 14.8 oz) IBW/kg (Calculated) : 73 kg Heparin Dosing Weight: 99.2 kg  Vital Signs: Temp: 98.1 F (36.7 C) (09/08 0404) Temp Source: Oral (09/08 0404) BP: 148/100 (09/08 0407) Pulse Rate: 68 (09/08 0430)  Labs: Recent Labs    11/11/20 2236 11/09/2020 1833 11/13/2020 2059 11/13/20 0408 11/13/20 1243 11/14/20 0108  HGB 12.0*  --   --  10.6*  --  11.1*  HCT 37.1*  --   --  33.6*  --  36.0*  PLT 297  --   --  345  --  248  HEPARINUNFRC  --   --   --  0.20* 0.31 0.84*  CREATININE  --  1.77*  --  2.07*  --  2.27*  TROPONINIHS  --   --  20* 20*  --   --     Estimated Creatinine Clearance: 33.4 mL/min (A) (by C-G formula based on SCr of 2.27 mg/dL (H)).   Medical History: Past Medical History:  Diagnosis Date   Arthritis Sept. 2015   Rh. Neck and Upper Back   Arthritis Sept. 2015   Gout-Right Hand  Left knee   Cancer ([place])    8 wks Radiation   Chronic kidney disease    Diabetes mellitus    ED (erectile dysfunction)    Hx of agent Orange exposure    while [place] in [place] during that conflict   Hyperlipidemia    Hypertension    PVD (peripheral vascular disease) ([place])    PVT (paroxysmal ventricular tachycardia) ([facility])    S/P BKA (below [facility] amputation) [facility])     Assessment: 80 yo male presents with possible osteomyelitis. PTA the patient is on apixaban for atrial fibrillation (last dose 9/01). The patient also reports a history of DVT that was >10 years ago and was treated with warfarin.   Patient was transitioned to IV heparin while inpatient and held 9/06 AM prior to surgery. The patient is now  s/p L below knee amputation. Pharmacy consulted to resume IV heparin hours post op on 9/06.   Now transitioning patient from IV heparin infusion to PO apixaban. Due to age (>/= 76) and Scr > 1.5, will initiate apixaban at 2.'5mg'$  twice daily starting this AM.  H/H mildly low. Plt wnl.   Goal of Therapy:  Monitor platelets by anticoagulation protocol: Yes   Plan:  - Stop heparin gtt @ 1000 - Start apixaban 2.'5mg'$  twice daily @ 10000 - Watch for signs and symptoms of bleeding   Thank you for allowing pharmacy to be a part of this patient's care.  Ardyth Harps, PharmD Clinical Pharmacist

## 2020-11-14 NOTE — Progress Notes (Addendum)
Pt had 4-5 LPM of O2 NCL since day shift yesterday. Around 12 am. Pt became more ortopnea, restless, agitated, disoriented x 3, labored breathing with accessory muscle used. He had productive cough with white clear frothy sputum. Left lung sound had wheezing coarse crackles, right lung was diminished breath sound on auscultation. RT was notified for breathing treatment at 3:30 am and symptoms was not improved. BIPAP was given to PTat 4:45 am.   We are concerning that Pt is not comprehensively managing his insulin pump due to his altered mental status. His blood glucose was 76 at 2 am and dropped to 58 mg/dl around 5 am. 50% dextrose 12.5 mg IV given per standing order. Rechecked blood glucose after 15 minutes of IV dextrose--> 89 mg/dl. MD made aware.   Dr. Tonie Griffith  was notified. Dr Tonie Griffith came to assess Pt at bedside. Order received for stat Lasix 40 mg, chest x-ray, BNP and ABG.   Pt is hemodynamically stable. NSR on monitor, HR 70-80s, BP 123/70- 148/100 mmHG, SPO2 97-100%, Pt remains afebrile. His left leg incision is dry, clean and dressing intact. Heparin gtt at 2200 units per hr. NSS at 50 ml/hr. He has good urine output through condom cath 550 ml before administering Lasix. We will continue to monitor.  Kennyth Lose, RN

## 2020-11-14 NOTE — Progress Notes (Signed)
Inpatient Diabetes Program Recommendations  AACE/ADA: New Consensus Statement on Inpatient Glycemic Control (2015)  Target Ranges:  Prepandial:   less than 140 mg/dL      Peak postprandial:   less than 180 mg/dL (1-2 hours)      Critically ill patients:  140 - 180 mg/dL   Lab Results  Component Value Date   GLUCAP 153 (H) 11/14/2020   HGBA1C 7.2 (H) 11/13/2020    Review of Glycemic Control Results for CASYN, TORRENCE (MRN [id number]) as of 11/14/2020 13:59  Ref. Range 11/14/2020 01:49 11/14/2020 04:44 11/14/2020 05:10 11/14/2020 10:20 11/14/2020 11:11  Glucose-Capillary Latest Ref Range: 70 - 99 mg/dL 76 58 (L) 89 64 (L) 153 (H)    Diabetes history: type 2? Outpatient Diabetes medications: Medtronic 630G insulin pump and Dexcom CGM Current orders for Inpatient glycemic control: insulin pump   Inpatient Diabetes Program Recommendations:     Endocrinologist: [facility] Dr. Grandville Silos   Insulin pump settings: (10/04/20) 00:00 3.85 U/hr.  0900  4.25 U/hr.  15:00  4.45 U/hr.  Total: 100.2 units basal  Bolus ICR: 3.0  ISF:8  Target glucose: 100-150 mg/dl   Last A1C was 8.1%.     Spoke with patient regarding insulin pump per nursing page.  When assessing insulin pump, insulin chamber was empty. Unknown length of time patient had been without insulin. Unable to access settings.  When asked, patient not answering questions appropriately; could not load additional insulin in chamber effectively.  At 0200 CBG 76 mg/dL, patient reports bolusing? However, could not verify.  CBG on lower side and patient had been receiving interventions this AM.   Given patient not appropriate for insulin pump consider adding Novolog 0-9 units TID & HS, and a portion of basal.  Secure chat sent to MD and RN.   Thanks, Bronson Curb, MSN, RNC-OB Diabetes Coordinator [phone] (8a-5p)

## 2020-11-14 NOTE — Progress Notes (Signed)
Physical Therapy Treatment Patient Details Name: Patrick Shelton MRN: [id number] DOB: 1940/05/03 Today's Date: 11/14/2020    History of Present Illness 80 yo male presents to [facility] on 8/31 with L 4th toe pain and osteomyeltis, bacteremia. 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 PMH includes OA, gout, cancer with radiation, DM with retinopathy and neuropathy, HLD, HTN, R BKA 2009.    PT Comments    The pt was agreeable to session with focus on progressing OOB mobility and capacity for transfers. However, cognitive deficits including STM, problem solving, and safety awareness limited the pts ability to progress mobility as he required frequent re-orientation to task, had difficulty following cues for technique, and reported fatigue that prevented him from progressing to OOB transfers. The pt required maxA to complete bed mobility and lateral scooting along the EOB. Will benefit from CIR level therapies as he has supervision at home from his wife, but she is unable to physically assist him for mobility. Will continue to benefit from skilled PT acutely to progress transfers and independence with mobility.     Follow Up Recommendations  CIR     Equipment Recommendations  Other (comment) (drop -arm BSC)    Recommendations for Other Services       Precautions / Restrictions Precautions Precautions: Fall Precaution Comments: new L BKA, chronic R BKA with prosthetic Restrictions Weight Bearing Restrictions: Yes LLE Weight Bearing: Non weight bearing    Mobility  Bed Mobility Overal bed mobility: Needs Assistance Bed Mobility: Rolling;Sidelying to Sit;Sit to Supine Rolling: Min assist;+2 for physical assistance Sidelying to sit: Mod assist;HOB elevated;+2 for physical assistance   Sit to supine: Mod assist;+2 for physical assistance   General bed mobility comments: modA for all aspects with repeated cues and    Transfers Overall transfer level: Needs assistance   Transfers: Lateral/Scoot Transfers           Lateral/Scoot Transfers: Max assist;+2 physical assistance General transfer comment: pt unable to scoot laterally as he insisted on removing his R prosthesis prior to attempting to scoot. pt also stating "I don't scoot" multiple times thorugh the session but eventually convinced to attempt scooting, did require maxA of 2 withbed pad to complete. pt pushing with LLE despite repeated cues not to push with LLE     Balance Overall balance assessment: Needs assistance Sitting-balance support: Bilateral upper extremity supported;Feet unsupported Sitting balance-Leahy Scale: Poor Sitting balance - Comments: pt able to maintain static sitting EOB without physical assist, does require BUE support                                    Cognition Arousal/Alertness: Awake/alert Behavior During Therapy: Restless;Agitated Overall Cognitive Status: Impaired/Different from baseline Area of Impairment: Memory;Attention;Following commands;Safety/judgement;Problem solving                   Current Attention Level: Focused Memory: Decreased recall of precautions;Decreased short-term memory Following Commands: Follows one step commands with increased time Safety/Judgement: Decreased awareness of deficits;Decreased awareness of safety   Problem Solving: Difficulty sequencing;Decreased initiation;Requires verbal cues;Requires tactile cues;Slow processing General Comments: pt oriented, but with significant difficulty following commands or retaining information given. The pt was informed goal of session (to practice transfers) and then he continued to ask "what are we even doing here" x4 through session with no ability to recall information previously given by therapist. after session, pt adamantly denying statements he made during session stating "I never  said that, I said ___" and altering his previous statement.      Exercises      General Comments General comments (skin integrity,  edema, etc.): VSS on RA      Pertinent Vitals/Pain Pain Assessment: Faces Faces Pain Scale: Hurts a little bit Pain Location: Bilateral hands (neuropathy) and buttocks (wound) Pain Descriptors / Indicators: Discomfort Pain Intervention(s): Limited activity within patient's tolerance;Monitored during session;Repositioned     PT Goals (current goals can now be found in the care plan section) Acute Rehab PT Goals Patient Stated Goal: To return home. PT Goal Formulation: With patient Time For Goal Achievement: 11/27/20 Potential to Achieve Goals: Fair Progress towards PT goals: Progressing toward goals    Frequency    Min 3X/week      PT Plan Current plan remains appropriate       AM-PAC PT "6 Clicks" Mobility   Outcome Measure  Help needed turning from your back to your side while in a flat bed without using bedrails?: A Lot Help needed moving from lying on your back to sitting on the side of a flat bed without using bedrails?: A Lot Help needed moving to and from a bed to a chair (including a wheelchair)?: Total Help needed standing up from a chair using your arms (e.g., wheelchair or bedside chair)?: Total Help needed to walk in hospital room?: Total Help needed climbing 3-5 steps with a railing? : Total 6 Click Score: 8    End of Session   Activity Tolerance: Patient limited by fatigue Patient left: in bed;with call bell/phone within reach (unable to set bed alarm due to bed malfunction) Nurse Communication: Mobility status PT Visit Diagnosis: Other abnormalities of gait and mobility (R26.89);Muscle weakness (generalized) (M62.81)     Time: 1310-1340 PT Time Calculation (min) (ACUTE ONLY): 30 min  Charges:  $Therapeutic Activity: 23-37 mins                     West Carbo, PT, DPT   Acute Rehabilitation Department Pager #: [phone]   Sandra Cockayne 11/14/2020, 2:03 PM

## 2020-11-14 NOTE — Progress Notes (Signed)
Notified by RN that pt with confusion and oriented to self only and having respiratory distress and increased work of breathing.  RT placed back on Bipap that he was on a few days ago.  He has increased respiratory rate, normal pulse and Bp stable. Afebrile.  Bipap mask in good position. Pt does not answer questions but will follow simple commands such as squeezing fingers.  Had blood sugar of 59 earlier and drank some juice.   CV-RRR Has coarse rales bilaterally with mild expiratory  Abdomen soft and nondistended.   Check ABG, CXR Given lasix Check BNP Blood sugar now is 58.  Given D50 amp.

## 2020-11-15 ENCOUNTER — Inpatient Hospital Stay (HOSPITAL_COMMUNITY): Payer: Medicare Other

## 2020-11-15 ENCOUNTER — Inpatient Hospital Stay (HOSPITAL_COMMUNITY): Payer: TRICARE For Life (TFL)

## 2020-11-15 ENCOUNTER — Encounter (HOSPITAL_COMMUNITY): Payer: Self-pay

## 2020-11-15 ENCOUNTER — Inpatient Hospital Stay (HOSPITAL_COMMUNITY): Payer: Medicare Other | Admitting: Certified Registered Nurse Anesthetist

## 2020-11-15 ENCOUNTER — Other Ambulatory Visit (HOSPITAL_COMMUNITY): Payer: TRICARE For Life (TFL)

## 2020-11-15 ENCOUNTER — Ambulatory Visit (HOSPITAL_COMMUNITY)
Admission: RE | Admit: 2020-11-15 | Discharge: 2020-11-15 | Disposition: A | Discharge status: Home / Self Care | Payer: TRICARE For Life (TFL) | Source: Ambulatory Visit | Attending: Physician Assistant | Admitting: Physician Assistant

## 2020-11-15 DIAGNOSIS — E872 Acidosis, unspecified: Secondary | ICD-10-CM | POA: Diagnosis not present

## 2020-11-15 DIAGNOSIS — E11628 Type 2 diabetes mellitus with other skin complications: Secondary | ICD-10-CM | POA: Diagnosis not present

## 2020-11-15 DIAGNOSIS — R579 Shock, unspecified: Secondary | ICD-10-CM | POA: Diagnosis not present

## 2020-11-15 DIAGNOSIS — I469 Cardiac arrest, cause unspecified: Secondary | ICD-10-CM

## 2020-11-15 DIAGNOSIS — I447 Left bundle-branch block, unspecified: Secondary | ICD-10-CM

## 2020-11-15 DIAGNOSIS — R569 Unspecified convulsions: Secondary | ICD-10-CM

## 2020-11-15 DIAGNOSIS — G40409 Other generalized epilepsy and epileptic syndromes, not intractable, without status epilepticus: Secondary | ICD-10-CM | POA: Diagnosis not present

## 2020-11-15 DIAGNOSIS — L089 Local infection of the skin and subcutaneous tissue, unspecified: Secondary | ICD-10-CM | POA: Diagnosis not present

## 2020-11-15 DIAGNOSIS — E7220 Disorder of urea cycle metabolism, unspecified: Secondary | ICD-10-CM | POA: Diagnosis not present

## 2020-11-15 DIAGNOSIS — T17908A Unspecified foreign body in respiratory tract, part unspecified causing other injury, initial encounter: Secondary | ICD-10-CM | POA: Diagnosis not present

## 2020-11-15 DIAGNOSIS — E875 Hyperkalemia: Secondary | ICD-10-CM

## 2020-11-15 DIAGNOSIS — Z978 Presence of other specified devices: Secondary | ICD-10-CM

## 2020-11-15 LAB — RENAL FUNCTION PANEL
Albumin: 2.1 g/dL — ABNORMAL LOW (ref 3.5–5.0)
Anion gap: 10 (ref 5–15)
BUN: 61 mg/dL — ABNORMAL HIGH (ref 8–23)
CO2: 20 mmol/L — ABNORMAL LOW (ref 22–32)
Calcium: 8.6 mg/dL — ABNORMAL LOW (ref 8.9–10.3)
Chloride: 112 mmol/L — ABNORMAL HIGH (ref 98–111)
Creatinine, Ser: 2.6 mg/dL — ABNORMAL HIGH (ref 0.61–1.24)
GFR, Estimated: 24 mL/min — ABNORMAL LOW (ref >60)
Glucose, Bld: 423 mg/dL — ABNORMAL HIGH (ref 70–99)
Phosphorus: 3.7 mg/dL (ref 2.5–4.6)
Potassium: 5 mmol/L (ref 3.5–5.1)
Sodium: 142 mmol/L (ref 135–145)

## 2020-11-15 LAB — POCT I-STAT, CHEM 8
BUN: 50 mg/dL — ABNORMAL HIGH (ref 8–23)
Calcium, Ion: 1.28 mmol/L (ref 1.15–1.40)
Chloride: 113 mmol/L — ABNORMAL HIGH (ref 98–111)
Creatinine, Ser: 2 mg/dL — ABNORMAL HIGH (ref 0.61–1.24)
Glucose, Bld: 358 mg/dL — ABNORMAL HIGH (ref 70–99)
HCT: 32 % — ABNORMAL LOW (ref 39.0–52.0)
Hemoglobin: 10.9 g/dL — ABNORMAL LOW (ref 13.0–17.0)
Potassium: 5 mmol/L (ref 3.5–5.1)
Sodium: 145 mmol/L (ref 135–145)
TCO2: 23 mmol/L (ref 22–32)

## 2020-11-15 LAB — COMPREHENSIVE METABOLIC PANEL
ALT: 452 U/L — ABNORMAL HIGH (ref 0–44)
AST: 918 U/L — ABNORMAL HIGH (ref 15–41)
Albumin: 2.2 g/dL — ABNORMAL LOW (ref 3.5–5.0)
Alkaline Phosphatase: 95 U/L (ref 38–126)
Anion gap: 14 (ref 5–15)
BUN: 52 mg/dL — ABNORMAL HIGH (ref 8–23)
CO2: 20 mmol/L — ABNORMAL LOW (ref 22–32)
Calcium: 9.3 mg/dL (ref 8.9–10.3)
Chloride: 109 mmol/L (ref 98–111)
Creatinine, Ser: 2.15 mg/dL — ABNORMAL HIGH (ref 0.61–1.24)
GFR, Estimated: 30 mL/min — ABNORMAL LOW (ref >60)
Glucose, Bld: 321 mg/dL — ABNORMAL HIGH (ref 70–99)
Potassium: 5.4 mmol/L — ABNORMAL HIGH (ref 3.5–5.1)
Sodium: 143 mmol/L (ref 135–145)
Total Bilirubin: 0.6 mg/dL (ref 0.3–1.2)
Total Protein: 6.5 g/dL (ref 6.5–8.1)

## 2020-11-15 LAB — POCT I-STAT 7, (LYTES, BLD GAS, ICA,H+H)
Acid-base deficit: 5 mmol/L — ABNORMAL HIGH (ref 0.0–2.0)
Acid-base deficit: 4 mmol/L — ABNORMAL HIGH (ref 0.0–2.0)
Bicarbonate: 21.7 mmol/L (ref 20.0–28.0)
Bicarbonate: 20.2 mmol/L (ref 20.0–28.0)
Calcium, Ion: 1.27 mmol/L (ref 1.15–1.40)
Calcium, Ion: 1.3 mmol/L (ref 1.15–1.40)
HCT: 33 % — ABNORMAL LOW (ref 39.0–52.0)
HCT: 28 % — ABNORMAL LOW (ref 39.0–52.0)
Hemoglobin: 11.2 g/dL — ABNORMAL LOW (ref 13.0–17.0)
Hemoglobin: 9.5 g/dL — ABNORMAL LOW (ref 13.0–17.0)
O2 Saturation: 100 %
O2 Saturation: 95 %
Patient temperature: 98.6
Patient temperature: 98.3
Potassium: 5.1 mmol/L (ref 3.5–5.1)
Potassium: 4.8 mmol/L (ref 3.5–5.1)
Sodium: 145 mmol/L (ref 135–145)
Sodium: 143 mmol/L (ref 135–145)
TCO2: 23 mmol/L (ref 22–32)
TCO2: 21 mmol/L — ABNORMAL LOW (ref 22–32)
pCO2 arterial: 44.7 mmHg (ref 32.0–48.0)
pCO2 arterial: 33.1 mmHg (ref 32.0–48.0)
pH, Arterial: 7.295 — ABNORMAL LOW (ref 7.350–7.450)
pH, Arterial: 7.392 (ref 7.350–7.450)
pO2, Arterial: 256 mmHg — ABNORMAL HIGH (ref 83.0–108.0)
pO2, Arterial: 73 mmHg — ABNORMAL LOW (ref 83.0–108.0)

## 2020-11-15 LAB — CBC WITH DIFFERENTIAL/PLATELET
Abs Immature Granulocytes: 1.29 10*3/uL — ABNORMAL HIGH (ref 0.00–0.07)
Basophils Absolute: 0.2 10*3/uL — ABNORMAL HIGH (ref 0.0–0.1)
Basophils Relative: 1 %
Eosinophils Absolute: 0 10*3/uL (ref 0.0–0.5)
Eosinophils Relative: 0 %
HCT: 36.9 % — ABNORMAL LOW (ref 39.0–52.0)
Hemoglobin: 11.3 g/dL — ABNORMAL LOW (ref 13.0–17.0)
Immature Granulocytes: 5 %
Lymphocytes Relative: 25 %
Lymphs Abs: 6.4 10*3/uL — ABNORMAL HIGH (ref 0.7–4.0)
MCH: 30.5 pg (ref 26.0–34.0)
MCHC: 30.6 g/dL (ref 30.0–36.0)
MCV: 99.5 fL (ref 80.0–100.0)
Monocytes Absolute: 1 10*3/uL (ref 0.1–1.0)
Monocytes Relative: 4 %
Neutro Abs: 17.1 10*3/uL — ABNORMAL HIGH (ref 1.7–7.7)
Neutrophils Relative %: 65 %
Platelets: 267 10*3/uL (ref 150–400)
RBC: 3.71 MIL/uL — ABNORMAL LOW (ref 4.22–5.81)
RDW: 15.1 % (ref 11.5–15.5)
WBC: 26 10*3/uL — ABNORMAL HIGH (ref 4.0–10.5)
nRBC: 0.2 % (ref 0.0–0.2)

## 2020-11-15 LAB — GLUCOSE, CAPILLARY
Glucose-Capillary: 274 mg/dL — ABNORMAL HIGH (ref 70–99)
Glucose-Capillary: 332 mg/dL — ABNORMAL HIGH (ref 70–99)
Glucose-Capillary: 385 mg/dL — ABNORMAL HIGH (ref 70–99)
Glucose-Capillary: 349 mg/dL — ABNORMAL HIGH (ref 70–99)
Glucose-Capillary: 201 mg/dL — ABNORMAL HIGH (ref 70–99)
Glucose-Capillary: 201 mg/dL — ABNORMAL HIGH (ref 70–99)
Glucose-Capillary: 175 mg/dL — ABNORMAL HIGH (ref 70–99)
Glucose-Capillary: 162 mg/dL — ABNORMAL HIGH (ref 70–99)
Glucose-Capillary: 156 mg/dL — ABNORMAL HIGH (ref 70–99)
Glucose-Capillary: 149 mg/dL — ABNORMAL HIGH (ref 70–99)
Glucose-Capillary: 148 mg/dL — ABNORMAL HIGH (ref 70–99)
Glucose-Capillary: 131 mg/dL — ABNORMAL HIGH (ref 70–99)

## 2020-11-15 LAB — TROPONIN I (HIGH SENSITIVITY)
Troponin I (High Sensitivity): 38 ng/L — ABNORMAL HIGH (ref <18)
Troponin I (High Sensitivity): 144 ng/L (ref <18)
Troponin I (High Sensitivity): 193 ng/L (ref <18)

## 2020-11-15 LAB — PROTIME-INR
INR: 1.8 — ABNORMAL HIGH (ref 0.8–1.2)
Prothrombin Time: 20.4 seconds — ABNORMAL HIGH (ref 11.4–15.2)

## 2020-11-15 LAB — HEPARIN LEVEL (UNFRACTIONATED): Heparin Unfractionated: 1.06 IU/mL — ABNORMAL HIGH (ref 0.30–0.70)

## 2020-11-15 LAB — BASIC METABOLIC PANEL
Anion gap: 16 — ABNORMAL HIGH (ref 5–15)
BUN: 52 mg/dL — ABNORMAL HIGH (ref 8–23)
CO2: 19 mmol/L — ABNORMAL LOW (ref 22–32)
Calcium: 9.2 mg/dL (ref 8.9–10.3)
Chloride: 108 mmol/L (ref 98–111)
Creatinine, Ser: 2.22 mg/dL — ABNORMAL HIGH (ref 0.61–1.24)
GFR, Estimated: 29 mL/min — ABNORMAL LOW (ref >60)
Glucose, Bld: 333 mg/dL — ABNORMAL HIGH (ref 70–99)
Potassium: 5.2 mmol/L — ABNORMAL HIGH (ref 3.5–5.1)
Sodium: 143 mmol/L (ref 135–145)

## 2020-11-15 LAB — CBC
HCT: 35.6 % — ABNORMAL LOW (ref 39.0–52.0)
Hemoglobin: 10.9 g/dL — ABNORMAL LOW (ref 13.0–17.0)
MCH: 30.1 pg (ref 26.0–34.0)
MCHC: 30.6 g/dL (ref 30.0–36.0)
MCV: 98.3 fL (ref 80.0–100.0)
Platelets: 250 10*3/uL (ref 150–400)
RBC: 3.62 MIL/uL — ABNORMAL LOW (ref 4.22–5.81)
RDW: 15.1 % (ref 11.5–15.5)
WBC: 23.3 10*3/uL — ABNORMAL HIGH (ref 4.0–10.5)
nRBC: 0.2 % (ref 0.0–0.2)

## 2020-11-15 LAB — LACTIC ACID, PLASMA
Lactic Acid, Venous: 6.1 mmol/L (ref 0.5–1.9)
Lactic Acid, Venous: 4.5 mmol/L (ref 0.5–1.9)
Lactic Acid, Venous: 3.4 mmol/L (ref 0.5–1.9)

## 2020-11-15 LAB — AMMONIA: Ammonia: 68 umol/L — ABNORMAL HIGH (ref 9–35)

## 2020-11-15 LAB — MAGNESIUM: Magnesium: 2 mg/dL (ref 1.7–2.4)

## 2020-11-15 LAB — APTT: aPTT: 94 seconds — ABNORMAL HIGH (ref 24–36)

## 2020-11-15 LAB — MRSA NEXT GEN BY PCR, NASAL: MRSA by PCR Next Gen: NOT DETECTED

## 2020-11-15 MED ORDER — ONDANSETRON HCL 4 MG/2ML IJ SOLN: 4.0000 mg | Freq: Four times a day (QID) | INTRAMUSCULAR | Status: DC | PRN

## 2020-11-15 MED ORDER — CHLORHEXIDINE GLUCONATE 0.12% ORAL RINSE (MEDLINE KIT)
15.0000 mL | Freq: Two times a day (BID) | OROMUCOSAL | Status: DC
  Administered 2020-11-15 – 2020-11-27 (×25): 15 mL via OROMUCOSAL

## 2020-11-15 MED ORDER — FENTANYL CITRATE (PF) 100 MCG/2ML IJ SOLN: 25.0000 ug | INTRAMUSCULAR | Status: DC | PRN

## 2020-11-15 MED ORDER — DEXTROSE 50 % IV SOLN: 0.0000 mL | INTRAVENOUS | Status: DC | PRN

## 2020-11-15 MED ORDER — APIXABAN 2.5 MG PO TABS: 2.5000 mg | ORAL_TABLET | Freq: Two times a day (BID) | ORAL | Status: DC

## 2020-11-15 MED ORDER — CHLORHEXIDINE GLUCONATE CLOTH 2 % EX PADS
6.0000 | MEDICATED_PAD | Freq: Every day | CUTANEOUS | Status: DC
  Administered 2020-11-16 – 2020-11-28 (×12): 6 via TOPICAL

## 2020-11-15 MED ORDER — NOREPINEPHRINE 4 MG/250ML-% IV SOLN
0.0000 ug/min | INTRAVENOUS | Status: DC
  Administered 2020-11-15: 2 ug/min via INTRAVENOUS
  Filled 2020-11-15 (×2): qty 250

## 2020-11-15 MED ORDER — ACETAMINOPHEN 325 MG PO TABS
650.0000 mg | ORAL_TABLET | ORAL | Status: DC
  Filled 2020-11-15: qty 2

## 2020-11-15 MED ORDER — SODIUM CHLORIDE 0.9 % IV SOLN
INTRAVENOUS | Status: DC | PRN
  Administered 2020-11-15 – 2020-11-19 (×3): 500 mL via INTRAVENOUS
  Administered 2020-11-26: 1000 mL via INTRAVENOUS
  Administered 2020-11-27: 250 mL via INTRAVENOUS

## 2020-11-15 MED ORDER — ACETAMINOPHEN 500 MG PO TABS
1000.0000 mg | ORAL_TABLET | Freq: Once | ORAL | Status: AC
  Administered 2020-11-15: 1000 mg via ORAL
  Filled 2020-11-15: qty 2

## 2020-11-15 MED ORDER — ACETAMINOPHEN 650 MG RE SUPP: 650.0000 mg | RECTAL | Status: DC

## 2020-11-15 MED ORDER — ONDANSETRON HCL 4 MG PO TABS: 4.0000 mg | ORAL_TABLET | Freq: Four times a day (QID) | ORAL | Status: DC | PRN

## 2020-11-15 MED ORDER — LEVETIRACETAM IN NACL 500 MG/100ML IV SOLN
500.0000 mg | Freq: Two times a day (BID) | INTRAVENOUS | Status: DC
  Administered 2020-11-17 – 2020-11-28 (×23): 500 mg via INTRAVENOUS
  Filled 2020-11-15 (×23): qty 100

## 2020-11-15 MED ORDER — ORAL CARE MOUTH RINSE
15.0000 mL | OROMUCOSAL | Status: DC
  Administered 2020-11-15 – 2020-11-28 (×121): 15 mL via OROMUCOSAL

## 2020-11-15 MED ORDER — VASOPRESSIN 20 UNITS/100 ML INFUSION FOR SHOCK
0.0300 [IU]/min | INTRAVENOUS | Status: DC
  Administered 2020-11-15: 0.03 [IU]/min via INTRAVENOUS
  Filled 2020-11-15: qty 100

## 2020-11-15 MED ORDER — BUSPIRONE HCL 10 MG PO TABS
30.0000 mg | ORAL_TABLET | Freq: Three times a day (TID) | ORAL | Status: DC
  Administered 2020-11-15 – 2020-11-26 (×34): 30 mg
  Filled 2020-11-15 (×27): qty 3

## 2020-11-15 MED ORDER — ACETAMINOPHEN 160 MG/5ML PO SOLN
650.0000 mg | ORAL | Status: DC
  Administered 2020-11-15 – 2020-11-23 (×44): 650 mg
  Filled 2020-11-15 (×42): qty 20.3

## 2020-11-15 MED ORDER — EPINEPHRINE 1 MG/10ML IJ SOSY
PREFILLED_SYRINGE | INTRAMUSCULAR | Status: AC
  Filled 2020-11-15: qty 10

## 2020-11-15 MED ORDER — EPINEPHRINE 1 MG/10ML IJ SOSY
PREFILLED_SYRINGE | INTRAMUSCULAR | Status: AC
  Administered 2020-11-15: 1 mg
  Filled 2020-11-15: qty 10

## 2020-11-15 MED ORDER — PROPOFOL 1000 MG/100ML IV EMUL
0.0000 ug/kg/min | INTRAVENOUS | Status: DC
  Administered 2020-11-15: 5 ug/kg/min via INTRAVENOUS
  Administered 2020-11-15: 20 ug/kg/min via INTRAVENOUS
  Administered 2020-11-16 (×2): 30 ug/kg/min via INTRAVENOUS
  Administered 2020-11-16: 20 ug/kg/min via INTRAVENOUS
  Administered 2020-11-16: 40 ug/kg/min via INTRAVENOUS
  Administered 2020-11-16: 30 ug/kg/min via INTRAVENOUS
  Administered 2020-11-17 – 2020-11-18 (×3): 40 ug/kg/min via INTRAVENOUS
  Filled 2020-11-15: qty 100
  Filled 2020-11-15: qty 200
  Filled 2020-11-15 (×5): qty 100
  Filled 2020-11-15: qty 200
  Filled 2020-11-15 (×4): qty 100
  Filled 2020-11-15: qty 200

## 2020-11-15 MED ORDER — PIPERACILLIN-TAZOBACTAM 3.375 G IVPB
3.3750 g | Freq: Three times a day (TID) | INTRAVENOUS | Status: AC
  Administered 2020-11-15 – 2020-11-20 (×18): 3.375 g via INTRAVENOUS
  Filled 2020-11-15 (×18): qty 50

## 2020-11-15 MED ORDER — INSULIN ASPART 100 UNIT/ML IJ SOLN
0.0000 [IU] | INTRAMUSCULAR | Status: DC
  Administered 2020-11-15: 20 [IU] via SUBCUTANEOUS
  Administered 2020-11-15: 15 [IU] via SUBCUTANEOUS

## 2020-11-15 MED ORDER — ARTIFICIAL TEARS OPHTHALMIC OINT
TOPICAL_OINTMENT | OPHTHALMIC | Status: DC | PRN
  Administered 2020-11-23: 1 via OPHTHALMIC
  Filled 2020-11-15 (×2): qty 3.5

## 2020-11-15 MED ORDER — FENTANYL CITRATE (PF) 100 MCG/2ML IJ SOLN
25.0000 ug | INTRAMUSCULAR | Status: DC | PRN
  Administered 2020-11-17: 25 ug via INTRAVENOUS
  Administered 2020-11-17 – 2020-11-18 (×5): 50 ug via INTRAVENOUS
  Filled 2020-11-15 (×6): qty 2

## 2020-11-15 MED ORDER — ATROPINE SULFATE 1 MG/10ML IJ SOSY
PREFILLED_SYRINGE | INTRAMUSCULAR | Status: AC
  Filled 2020-11-15: qty 10

## 2020-11-15 MED ORDER — PANTOPRAZOLE SODIUM 40 MG PO PACK
40.0000 mg | PACK | Freq: Every day | ORAL | Status: DC
  Administered 2020-11-15 – 2020-11-21 (×7): 40 mg
  Filled 2020-11-15 (×7): qty 20

## 2020-11-15 MED ORDER — LORAZEPAM 2 MG/ML IJ SOLN
2.0000 mg | Freq: Once | INTRAMUSCULAR | Status: AC
  Administered 2020-11-15: 2 mg via INTRAVENOUS
  Filled 2020-11-15: qty 1

## 2020-11-15 MED ORDER — HEPARIN (PORCINE) 25000 UT/250ML-% IV SOLN
1450.0000 [IU]/h | INTRAVENOUS | Status: DC
  Administered 2020-11-15 – 2020-11-16 (×2): 1800 [IU]/h via INTRAVENOUS
  Administered 2020-11-16: 1600 [IU]/h via INTRAVENOUS
  Administered 2020-11-17 – 2020-11-24 (×10): 1450 [IU]/h via INTRAVENOUS
  Filled 2020-11-15 (×13): qty 250

## 2020-11-15 MED ORDER — ACETAMINOPHEN 650 MG RE SUPP: 650.0000 mg | Freq: Four times a day (QID) | RECTAL | Status: DC | PRN

## 2020-11-15 MED ORDER — VITAMIN D 25 MCG (1000 UNIT) PO TABS
5000.0000 [IU] | ORAL_TABLET | Freq: Every day | ORAL | Status: DC
  Administered 2020-11-15 – 2020-11-26 (×12): 5000 [IU]
  Filled 2020-11-15 (×12): qty 5

## 2020-11-15 MED ORDER — ACETAMINOPHEN 160 MG/5ML PO SOLN
650.0000 mg | Freq: Four times a day (QID) | ORAL | Status: DC | PRN
  Filled 2020-11-15: qty 20.3

## 2020-11-15 MED ORDER — SODIUM CHLORIDE 0.9 % IV SOLN
2000.0000 mg | Freq: Once | INTRAVENOUS | Status: AC
  Administered 2020-11-15: 2000 mg via INTRAVENOUS
  Filled 2020-11-15: qty 20

## 2020-11-15 MED ORDER — INSULIN REGULAR(HUMAN) IN NACL 100-0.9 UT/100ML-% IV SOLN
INTRAVENOUS | Status: DC
  Administered 2020-11-15: 11.5 [IU]/h via INTRAVENOUS
  Filled 2020-11-15 (×2): qty 100

## 2020-11-15 MED ORDER — DOCUSATE SODIUM 50 MG/5ML PO LIQD
100.0000 mg | Freq: Every day | ORAL | Status: DC
  Administered 2020-11-16 – 2020-11-27 (×3): 100 mg
  Filled 2020-11-15 (×3): qty 10

## 2020-11-15 MED ORDER — SODIUM ZIRCONIUM CYCLOSILICATE 10 G PO PACK
10.0000 g | PACK | Freq: Two times a day (BID) | ORAL | Status: DC
  Filled 2020-11-15: qty 1

## 2020-11-15 MED ORDER — POLYETHYLENE GLYCOL 3350 17 G PO PACK
17.0000 g | PACK | Freq: Every day | ORAL | Status: DC
  Administered 2020-11-16 – 2020-11-27 (×2): 17 g
  Filled 2020-11-15 (×2): qty 1

## 2020-11-15 MED ORDER — LORAZEPAM 2 MG/ML IJ SOLN: 2.0000 mg | INTRAMUSCULAR | Status: DC | PRN

## 2020-11-15 MED ORDER — NOREPINEPHRINE 16 MG/250ML-% IV SOLN
0.0000 ug/min | INTRAVENOUS | Status: DC
  Administered 2020-11-15: 5 ug/min via INTRAVENOUS
  Filled 2020-11-15 (×2): qty 250

## 2020-11-15 MED ORDER — DEXMEDETOMIDINE HCL IN NACL 400 MCG/100ML IV SOLN: 0.0000 ug/kg/h | INTRAVENOUS | Status: AC

## 2020-11-15 MED ORDER — BUSPIRONE HCL 10 MG PO TABS
30.0000 mg | ORAL_TABLET | Freq: Three times a day (TID) | ORAL | Status: DC
  Administered 2020-11-27: 30 mg via ORAL
  Filled 2020-11-15 (×10): qty 3

## 2020-11-15 MED ORDER — MIDAZOLAM HCL 2 MG/2ML IJ SOLN
4.0000 mg | INTRAMUSCULAR | Status: DC | PRN
  Administered 2020-11-22: 4 mg via INTRAVENOUS
  Filled 2020-11-15 (×2): qty 4

## 2020-11-15 NOTE — Consult Note (Addendum)
Neurology Consultation Reason for Consult: Myoclonic seizure Referring Physician: Dr. Noemi Chapel  CC: Cardiac arrest  History is obtained from: Chart review as patient is comatose  HPI: Patrick Shelton is a 80 y.o. male who initially presented to the emergency room on 11/05/2020 with gangrene of left fourth toe and was seen by vascular surgery.  Patient underwent left BKA on 12/02/2020.  Postop course was complicated by acute on chronic renal failure encephalopathy and respiratory distress.  On 11/15/2020 at around [place], patient went unresponsive and pulseless while repositioning. CODE BLUE was called.  ROSC was achieved after 15 minutes of CPR.  Patient was transferred to ICU where he was noted to have recurrent episodes of shaking concerning for myoclonus.  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  Neurology was consulted for further management.   ROS: Unable to obtain due to altered mental status.   Past Medical History:  Diagnosis Date   Arthritis Sept. 2015   Rh. Neck and Upper Back   Arthritis Sept. 2015   Gout-Right Hand  Left knee   Cancer ([place])    8 wks Radiation   Chronic kidney disease    Diabetes mellitus    ED (erectile dysfunction)    Hx of agent Orange exposure    [place] in [place] during that conflict   Hyperlipidemia    Hypertension    PVD (peripheral vascular disease) ([place])    PVT (paroxysmal ventricular tachycardia) ([facility])    S/P BKA (below knee amputation) ([place])     Family History  Problem Relation Age of Onset   Dementia Mother    Diabetes Father        Amputation- Bilateral leg   Peripheral vascular disease Father     Social History:  reports that he quit smoking about 43 years ago. His smoking use included cigarettes. He has a 2.50 pack-year smoking history. He has never used smokeless tobacco. He reports that he does not drink alcohol and does not use drugs.   Medications Prior to Admission  Medication Sig  Dispense Refill Last Dose   acetaminophen (TYLENOL) 500 MG tablet Take 1,000 mg by mouth every 6 (six) hours as needed for moderate pain.   11/05/2020   allopurinol (ZYLOPRIM) 100 MG tablet Take 100 mg by mouth daily.   10/14/2020   amLODipine (NORVASC) 5 MG tablet TAKE 1 TABLET(5 MG) BY MOUTH DAILY (Patient taking differently: Take 5 mg by mouth daily.) 90 tablet 3 11/05/2020   apixaban (ELIQUIS) 5 MG TABS tablet Take 1 tablet (5 mg total) by mouth 2 (two) times daily. 180 tablet 1 10/23/2020 at 1100   B Complex Vitamins (B COMPLEX PO) Take 1 tablet by mouth daily.    11/05/2020   Cholecalciferol (VITAMIN D-3) 5000 UNITS TABS Take 5,000 Units by mouth daily.    10/25/2020   colchicine 0.6 MG tablet Take 0.6 mg by mouth daily as needed (gout flare).   unk   Glucagon 3 MG/DOSE POWD Place 3 mg into the nose as needed (low blood sugar).   unk   hydrochlorothiazide 25 MG tablet Take 25 mg by mouth daily.   10/12/2020   losartan (COZAAR) 100 MG tablet Take 100 mg by mouth daily.   10/31/2020   NOVOLOG 100 UNIT/ML injection Inject into the skin continuous. Via insulin pump  1    OVER THE COUNTER MEDICATION Take 1 tablet by mouth daily. Fiberwell gummy   10/19/2020   pravastatin (PRAVACHOL) 40 MG  tablet Take 40 mg by mouth daily.   10/27/2020   VOLTAREN 1 % GEL Apply 2-4 g topically 4 (four) times daily as needed (pain).   Past Month   CONTOUR NEXT TEST test strip TEST BLOOD SUGAR LEVELS QID         Exam: Current vital signs: BP (!) 165/51   Pulse 65   Temp 99.2 F (37.3 C) (Oral)   Resp (!) 24   Ht '5\' 10"'$  (1.778 m)   Wt 117.9 kg   SpO2 97%   BMI 37.29 kg/m  Vital signs in last 24 hours: Temp:  [96.8 F (36 C)-99.2 F (37.3 C)] 99.2 F (37.3 C) (09/09 1114) Pulse Rate:  [38-93] 65 (09/09 1124) Resp:  [7-29] 24 (09/09 1124) BP: (63-173)/(40-96) 165/51 (09/09 1124) SpO2:  [95 %-100 %] 97 % (09/09 1124) Arterial Line BP: (122-201)/(33-63) 164/50 (09/09 1100) FiO2 (%):  [40 %-100 %] 40 % (09/09  1124)   Physical Exam  Constitutional: Appears well-developed and well-nourished.  Psych: Unable to assess due to comatose state  Head: Normocephalic, EEG leads in place Cardiovascular: Normal rate and regular rhythm.  Respiratory: CTAB, intubated Neuro: On propofol at 28mg/hr, pupils small and difficult appreciate any reactivity, corneal reflex absent, gag reflex present, withdraws to noxious stimuli in bilateral upper extremities, bilateral lower extremity BKA  I have reviewed labs in epic and the results pertinent to this consultation are: CBC:  Recent Labs  Lab 11/15/20 0102 11/15/20 0135 11/15/20 0206 11/15/20 0207 11/15/20 0928  WBC 26.0* 23.3*  --   --   --   NEUTROABS 17.1*  --   --   --   --   HGB 11.3* 10.9*   < > 10.9* 9.5*  HCT 36.9* 35.6*   < > 32.0* 28.0*  MCV 99.5 98.3  --   --   --   PLT 267 250  --   --   --    < > = values in this interval not displayed.    Basic Metabolic Panel:  Lab Results  Component Value Date   NA 143 11/15/2020   K 4.8 11/15/2020   CO2 20 (L) 11/15/2020   GLUCOSE 423 (H) 11/15/2020   BUN 61 (H) 11/15/2020   CREATININE 2.60 (H) 11/15/2020   CALCIUM 8.6 (L) 11/15/2020   GFRNONAA 24 (L) 11/15/2020   GFRAA 34 (L) 04/03/2014   Lipid Panel:  Lab Results  Component Value Date   LDLCALC 78 05/24/2010   HgbA1c:  Lab Results  Component Value Date   HGBA1C 7.2 (H) 11/13/2020   Urine Drug Screen: No results found for: LABOPIA, COCAINSCRNUR, LABBENZ, AMPHETMU, THCU, LABBARB  Alcohol Level No results found for: ETH   I have reviewed the images obtained:  CT Head without contrast 11/15/2020: Atrophy, chronic microvascular disease. No acute intracranial abnormality.  ASSESSMENT/PLAN: 80year old male status post cardiac arrest requiring 15 minutes of CPR [place] to have myoclonic seizures.  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  Discussed with RN that propofol can be increased further for clinical seizure suppression -Continue LTM EEG while we are adjusting medications -Discussed with wife at bedside that myoclonic seizures within the first 24 hours are usually suggestive of significant anoxic  brain injury.  However, he has cranial nerve reflexes and withdraws to noxious stimuli in both upper extremities.  Therefore we will attempt to treat seizures and obtain MRI brain without contrast at 72 hours to look for the extent of anoxic/hypoxic brain injury.  Wife states patient would not want tracheostomy and feeding tube.  They have 1 son who is in [place] and a daughter who just left for [place] yesterday and will not be back for about 2 weeks -Updated critical care NP Jennelle Human about my discussion with wife -As needed IV Versed 4 mg for clinical seizure-like activity -Continue seizure precautions -Management of rest of comorbidities per primary team  CRITICAL CARE Performed by: Lora Havens   Total critical care time: 45 minutes  Critical care time was exclusive of separately billable procedures and treating other patients.  Critical care was necessary to treat or prevent imminent or life-threatening deterioration.  Critical care was time spent personally by me on the following activities: development of treatment plan with patient and/or surrogate as well as nursing, discussions with consultants, evaluation of patient's response to treatment, examination of patient, obtaining history from patient or surrogate, ordering and performing treatments and interventions, ordering and review of laboratory studies, ordering and review of  radiographic studies, pulse oximetry and re-evaluation of patient's condition.   Zeb Comfort Epilepsy [facility] neuro[facility]ist

## 2020-11-15 NOTE — Procedures (Signed)
Central Venous Catheter Insertion Procedure Note  Jamarkus Hymer  [id number]  09-28-1940  Date:11/15/20  Time:3:32 AM   Provider Performing:Brizeyda Holtmeyer Jerilynn Mages Dewaine Oats   Procedure: Insertion of Non-tunneled Central Venous Catheter(36556) with US guidance [id number])   Indication(s) Medication administration  Consent Unable to obtain consent due to emergent nature of procedure.  Anesthesia Topical only with 1% lidocaine   Timeout Verified patient identification, verified procedure, site/side was marked, verified correct patient position, special equipment/implants available, medications/allergies/relevant history reviewed, required imaging and test results available.  Sterile Technique Maximal sterile technique including full sterile barrier drape, hand hygiene, sterile gown, sterile gloves, mask, hair covering, sterile ultrasound probe cover (if used).  Procedure Description Area of catheter insertion was cleaned with chlorhexidine and draped in sterile fashion.  With real-time ultrasound guidance a central venous catheter was placed into the left femoral vein. Nonpulsatile blood flow and easy flushing noted in all ports.  The catheter was sutured in place and sterile dressing applied.  Complications/Tolerance None; patient tolerated the procedure well. Chest X-ray is ordered to verify placement for internal jugular or subclavian cannulation.   Chest x-ray is not ordered for femoral cannulation.  EBL Minimal  Specimen(s) None

## 2020-11-15 NOTE — Progress Notes (Signed)
NAME:  Patrick Shelton, MRN:  [id number], DOB:  27-Jun-1940, LOS: 8 ADMISSION DATE:  10/17/2020, CONSULTATION DATE:  11/15/2020 REFERRING MD:  Dr. Tonie Griffith, CHIEF COMPLAINT:  Cardiac Arrest    History of Present Illness:  80 year old male presents to ED on 0000000 with complicated gangreof Left 4th toe. Vascular consulted. 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 Stay complicated by acute on chronic renal failure, hyperkalemia, encephalopathy, and respiratory distress.   9/8 patient with hypoxia and labored breathing with productive cough with white clear frothy sputum. Given lasix. 9/9 patient found unresponsive and pulseless. PEA. Required 15 minutes of CPR, 1 Calcium, 4 EPI, and 1 sodium Bicarb prior to ROSC. On arrival to ICU patient is hypotensive, bradycardiac, intubated, and unresponsive.   Pertinent  Medical History  DM, HTN, HLD, PAD, A.Fib on Eliquis, s/p Right BKA, now Left BKA   Significant Hospital Events: Including procedures, antibiotic start and stop dates in addition to other pertinent events   9/1 Presents to ED with suspected osteo of 4th toe to LLE  9/6 L BKA 9/9 early am cardiac arrest, prolonged, intubated, on pressors  Interim History / Subjective:  Intermittent brady episodes into the 30's, afib Normal temp CBGs in 300s Scant UOP, bladder scan with 100 ml NE and vaso off but back on low dose NE at 2 Remains on low dose propofol for abnormal movements, cEEG being hooked up Afebrile  Objective   Blood pressure (!) 173/67, pulse (!) 44, temperature 98.3 F (36.8 C), resp. rate (!) 21, height '5\' 10"'$  (1.778 m), weight 117.9 kg, SpO2 99 %.    Vent Mode: PRVC FiO2 (%):  [40 %-100 %] 60 % Set Rate:  [16 bmp-24 bmp] 24 bmp Vt Set:  [580 mL] 580 mL PEEP:  [5 cmH20] 5 cmH20 Plateau Pressure:  [22 cmH20-26 cmH20] 26 cmH20   Intake/Output Summary (Last 24 hours) at 11/15/2020 0859 Last data filed at 11/15/2020 0700 Gross per 24 hour  Intake 371.42 ml  Output --  Net 371.42 ml    Filed Weights   10/09/2020 1954 11/10/20 1900  Weight: 117.9 kg 117.9 kg    Examination: General:  critically ill older male lying in bed - NAD HEENT: MM pink/moist, ETT 7.5, OGT, pupils 3/sluggish, absent corneals,  Neuro: eyes open with intermittent jerks and shoulder jerks, facial twitching, pretty continuous oral movements, does not f/c or track, withdrawals to noxious stimuli in extremities CV: Afib 40-70s on my exam, left groin aline and CVL PULM:  non labored, triggering vent breaths, coarse, scant thick secretions, coarse throughout, delayed cough GI: soft, obese, ND, hypoBS, primafit  Extremities: warm/dry, B BKA, left wrapped in gauze/ dry, good radial pulses  Skin: no rashes    Resolved Hospital Problem list     Assessment & Plan:   Encephalopathy, concern for anoxic injury Abnormal motor movements concerning for myoclonus vs seizure Plan - CTH non acute overnight  - cEEG on, pending - will use propofol for now to help with suspected myoclonus, unable to use valproic acid given Transaminitis.  If worsening, can add a benzo which can make neuro prognostication more challenging.   - consider MRI +/- neuro consult if not improving.  - seizure precautions - serial neuro exams  - continue normothermia  - Maintain neuro protective measures; goal for euglycemia, eunatermia, normoxia, and PCO2 goal of 35-40 - prognosis guarded    Shock state s/p PEA Cardiac Arrest, unclear etiology, likely respiratory related given above recent events and increasing resp  distress 9/8, less likely PE given patient has been on heparin gtt then transitioned to eliquis.  Has had evidence of elevated BNP and signs of edema on CXR and failed his SLP on 9/8, with some concern for esophageal issues/ obstruction, but OGT was placed successfully      Plan - continue tele monitoring - ECHO pending  - CT C/A/P wo contrast with no obvious cause for arrest, noted for bilateral lobe consolidation c/w  pneumonia and questions UTI given surrounding inflammatory stranding - follow PAN Culture, abx as below - Titrate Levophed for MAP goal >65, currently off vasopressin - lactic acid improving 4.5-> 3.4 - trend trop hs, 38- 144 thus far, likely related to cardiac arrest, EKG with unchanged LBBB   A.Fib > Loletha Grayer   H/O HTN - previous TTE in 05/2020 with normal EF, G1DD Plan - had been on lopressor 25 mg bid, norvasc '5mg'$ , continue to hold given shock state - given worsening renal function, will change eliquis back to heparin gtt per pharmacy  - remains rate controlled - repeat BMET now, goal K > 4, Mag > 2   Respiratory Insufficieny secondary to above.  Acute Hypoxic Respiratory Failure Pneumonia, likely aspiration component Likely component of pulmonary edema previously Plan - Continue MV support, 8cc/kg IBW with goal Pplat <30 and DP<15  -VAP prevention protocol/ PPI -PAD protocol for sedation> propofol, prn fentanyl, RASS goal 0/-1 -wean FiO2 as able for SpO2 >92%  -daily SAT & SBT, mental status remains barrier to extubation - hold on further diuretics given AKI/ shock state.  Diuresed 9/8 - continue zosyn for now - follow trach aspirate  - SLP eval 9/8 questioned some esophogeal obstruction, recommended esophagram.  Further workup pending on neuro recovery.   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  Continue with zosyn for now  - Vascular surgery following   Anion Gap Metabolic Acidosis/ lactic acidosis Acute on Chronic Renal Failure with oliguira now 2/2 to hypoperfusion/ shock state Hyperkalemia  Mild hyperchloremic acidosis Plan - repeat BMET- K 5, sCr 2-> 2.60 - continue with primafit, bladder scan q 4 - send UA - serial renal indices  - Replace electrolytes as indicated  - Avoid Nephrotoxic Agents    Elevated LFT, suspect  secondary to hypoperfusion/ shock Plan  -Trend LFT/ INR - ammonia 68, repeat in am  Hyperglycemia  DM Plan - CBGs now in the 400s, wills change back to insulin gtt per protocol   Normocytic anemia - H/H stable, trend CBC  Best Practice (right click and "Reselect all SmartList Selections" daily)   Diet/type: NPO; hold on starting TF, possible start in am  DVT prophylaxis: DOAC-> changing to heparin gtt IV GI prophylaxis: PPI Lines: Central line Foley:  N/A; primafit and bladder scan  Code Status:  full code Last date of multidisciplinary goals of care discussion:  no family at bedside 9/9.   Labs   CBC: Recent Labs  Lab 11/11/20 2236 11/13/20 0408 11/14/20 0108 11/15/20 0102 11/15/20 0135 11/15/20 0206 11/15/20 0207  WBC 19.5* 24.7* 25.3* 26.0* 23.3*  --   --   NEUTROABS  --   --   --  17.1*  --   --   --   HGB 12.0* 10.6* 11.1* 11.3* 10.9* 11.2* 10.9*  HCT 37.1* 33.6* 36.0* 36.9* 35.6* 33.0* 32.0*  MCV 94.2 94.6 97.3  99.5 98.3  --   --   PLT 297 345 248 267 250  --   --     Basic Metabolic Panel: Recent Labs  Lab 11/13/20 0408 11/14/20 0108 11/14/20 1523 11/15/20 0102 11/15/20 0135 11/15/20 0206 11/15/20 0207  NA 138 142 140 143 143 145 145  K 6.0* 6.0* 5.0 5.4* 5.2* 5.1 5.0  CL 106 109 107 109 108  --  113*  CO2 '23 22 26 '$ 20* 19*  --   --   GLUCOSE 210* 59* 233* 321* 333*  --  358*  BUN 46* 52* 48* 52* 52*  --  50*  CREATININE 2.07* 2.27* 2.11* 2.15* 2.22*  --  2.00*  CALCIUM 9.0 8.8* 8.5* 9.3 9.2  --   --   MG  --   --   --  2.0  --   --   --    GFR: Estimated Creatinine Clearance: 37.9 mL/min (A) (by C-G formula based on SCr of 2 mg/dL (H)). Recent Labs  Lab 11/13/20 0408 11/14/20 0108 11/15/20 0102 11/15/20 0135 11/15/20 0536  WBC 24.7* 25.3* 26.0* 23.3*  --   LATICACIDVEN  --   --  6.1*  --  4.5*    Liver Function Tests: Recent Labs  Lab 11/15/20 0102  AST 918*  ALT 452*  ALKPHOS 95  BILITOT 0.6  PROT 6.5  ALBUMIN 2.2*   No  results for input(s): LIPASE, AMYLASE in the last 168 hours. Recent Labs  Lab 11/15/20 0131  AMMONIA 68*    ABG    Component Value Date/Time   PHART 7.295 (L) 11/15/2020 0206   PCO2ART 44.7 11/15/2020 0206   PO2ART 256 (H) 11/15/2020 0206   HCO3 21.7 11/15/2020 0206   TCO2 23 11/15/2020 0207   ACIDBASEDEF 5.0 (H) 11/15/2020 0206   O2SAT 100.0 11/15/2020 0206     Coagulation Profile: No results for input(s): INR, PROTIME in the last 168 hours.  Cardiac Enzymes: No results for input(s): CKTOTAL, CKMB, CKMBINDEX, TROPONINI in the last 168 hours.  HbA1C: Hgb A1c MFr Bld  Date/Time Value Ref Range Status  11/13/2020 04:08 AM 7.2 (H) 4.8 - 5.6 % Final    Comment:    (NOTE) Pre diabetes:          5.7%-6.4%  Diabetes:              >6.4%  Glycemic control for   <7.0% adults with diabetes   05/12/2010 01:54 PM 8.9 %     CBG: Recent Labs  Lab 11/14/20 1953 11/14/20 2126 11/15/20 0133 11/15/20 0405 11/15/20 0738  GLUCAP 246* 274* 274* 332* 385*    Critical care time: 40 minutes     CRITICAL CARE Performed by: Kennieth Rad   Total critical care time: 40 minutes  Critical care time was exclusive of separately billable procedures and treating other patients.  Critical care was necessary to treat or prevent imminent or life-threatening deterioration.  Critical care was time spent personally by me on the following activities: development of treatment plan with patient and/or surrogate as well as nursing, discussions with consultants, evaluation of patient's response to treatment, examination of patient, obtaining history from patient or surrogate, ordering and performing treatments and interventions, ordering and review of laboratory studies, ordering and review of radiographic studies, pulse oximetry and re-evaluation of patient's condition.   Kennieth Rad, ACNP [facility] 11/15/2020, 8:59 AM  See Amion for pager If no response to  pager, please call PCCM  consult pager After 7:00 pm call Elink

## 2020-11-15 NOTE — Code Documentation (Addendum)
  Patient Name: Patrick Shelton   MRN: [id number]   Date of Birth/ Sex: February 03, 1941 , male      Admission Date: 10/13/2020  Attending Provider: Charlynne Cousins, MD  Primary Diagnosis: Diabetic infection of left foot [facility])    Indication: Pt was in his usual state of health until this PM, when he was noted to be unresponsive. Code blue was subsequently called. At the time of arrival on scene, ACLS protocol was underway.   Technical Description:  - CPR performance duration:  15 minutes  - Was defibrillation or cardioversion used? No   - Was external pacer placed? No  - Was patient intubated pre/post CPR? Yes   Medications Administered: Y = Yes; Blank = No Amiodarone    Atropine    Calcium  x1  Epinephrine  x4  Lidocaine    Magnesium    Norepinephrine    Phenylephrine    Sodium bicarbonate  x1  Vasopressin     Post CPR evaluation:  - Final Status - Was patient successfully resuscitated ? Yes - What is current rhythm? Atrial fibrillation - What is current hemodynamic status? stable  Miscellaneous Information:  - Labs sent, including: Mg ABG CBC CMP lactic acid  - Primary team notified?  Yes  - Family Notified? Yes  - Additional notes/ transfer status: ROSC achieved and patient transferred to [facility]. PCCM notified.     Scarlett Presto, MD  11/15/2020, 1:47 AM

## 2020-11-15 NOTE — Progress Notes (Signed)
eLink Physician-Brief Progress Note Patient Name: Patrick Shelton DOB: 07/14/1940 MRN: [id number]   Date of Service  11/15/2020  HPI/Events of Note  Lactic acid 6.1 s/p code blue with 12 minutes of CPR to ROSC.  eICU Interventions  Trend serum lactic acid.        Kerry Kass [name] 11/15/2020, 3:37 AM

## 2020-11-15 NOTE — Progress Notes (Signed)
ANTICOAGULATION CONSULT NOTE  Pharmacy Consult for heparin Indication: atrial fibrillation  Allergies  Allergen Reactions   Prednisone Anaphylaxis and Shortness Of Breath   Dulaglutide Nausea Only   Atorvastatin    Lovastatin     Other reaction(s): OTHER REACTION    Patient Measurements: Height: '5\' 10"'$  (177.8 cm) Weight: 117.9 kg (259 lb 14.8 oz) IBW/kg (Calculated) : 73 Heparin Dosing Weight: 99kg  Vital Signs: Temp: 98.3 F (36.8 C) (09/09 0900) Temp Source: Axillary (09/09 0742) BP: 173/67 (09/09 0830) Pulse Rate: 44 (09/09 0830)  Labs: Recent Labs    11/13/20 0408 11/13/20 1243 11/14/20 0108 11/14/20 1523 11/15/20 0102 11/15/20 0131 11/15/20 0135 11/15/20 0206 11/15/20 0207 11/15/20 0536 11/15/20 0902 11/15/20 0928  HGB 10.6*  --  11.1*  --  11.3*  --  10.9* 11.2* 10.9*  --   --  9.5*  HCT 33.6*  --  36.0*  --  36.9*  --  35.6* 33.0* 32.0*  --   --  28.0*  PLT 345  --  248  --  267  --  250  --   --   --   --   --   LABPROT  --   --   --   --   --   --   --   --   --   --  20.4*  --   INR  --   --   --   --   --   --   --   --   --   --  1.8*  --   HEPARINUNFRC 0.20* 0.31 0.84*  --   --   --   --   --   --   --   --   --   CREATININE 2.07*  --  2.27*   < > 2.15*  --  2.22*  --  2.00*  --  2.60*  --   TROPONINIHS 20*  --   --   --   --  38*  --   --   --  144*  --   --    < > = values in this interval not displayed.    Estimated Creatinine Clearance: 29.2 mL/min (A) (by C-G formula based on SCr of 2.6 mg/dL (H)).   Medical History: Past Medical History:  Diagnosis Date   Arthritis Sept. 2015   Rh. Neck and Upper Back   Arthritis Sept. 2015   Gout-Right Hand  Left knee   Cancer ([place])    8 wks Radiation   Chronic kidney disease    Diabetes mellitus    ED (erectile dysfunction)    Hx of agent Orange exposure    [place] during that conflict   Hyperlipidemia    Hypertension    PVD (peripheral vascular disease) ([place])    PVT  (paroxysmal ventricular tachycardia) ([facility])    S/P BKA (below knee amputation) ([place])     Assessment: 42 yoM on apixaban PTA for hx AF admitted with gangrenous toe s/p L BKA. Apixaban resumed 9/8, then pt suffered PEA arrest. Pharmacy asked to transition back to apixaban.   CBC stable this morning, last dose 9/8 ~2100. Pt previously required ~2200 units/h but will begin infusion lower given acute illness and organ failure. Will likely need to dose via APTTs for a few days while apixaban clears.  Goal of Therapy:  Heparin level 0.3-0.7 units/ml aPTT 66-102 seconds Monitor platelets by anticoagulation protocol: Yes  Plan:  Heparin 1800 units/h no bolus Check heparin level and aPTT in 8h  Arrie Senate, PharmD, [place], [facility] Clinical Pharmacist [phone] Please check AMION for all [place] numbers 11/15/2020

## 2020-11-15 NOTE — Procedures (Signed)
Patient Name: Patrick Shelton  MRN: [id number]  Epilepsy Attending: Lora Havens  Referring Physician/Provider: Hayden Pedro, NP Date: 11/15/2020 Duration: 24.37 mins  Patient history: 80 year old male with altered mental status and concern for anoxic brain injury noted to have abnormal movements concerning for myoclonus or seizure.  EEG to evaluate for seizures.  Level of alertness: comatose  AEDs during EEG study: Propofol  Technical aspects: This EEG study was done with scalp electrodes positioned according to the 10-20 International system of electrode placement. Electrical activity was acquired at a sampling rate of '500Hz'$  and reviewed with a high frequency filter of '70Hz'$  and a low frequency filter of '1Hz'$ . EEG data were recorded continuously and digitally stored.   Description: EEG showed continuous generalized background suppression.  Every 30 seconds to 1 minute patient was noted to have brief whole-body jerk with eye opening.  Concomitant EEG showed generalized polyspikes consistent with myoclonic seizure.  Hyperventilation and photic stimulation were not performed.     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  During the seizure patient was noted to have brief full body jerks with eye opening.  Additionally there was profound diffuse encephalopathy.  This EEG is concerning for anoxic/hypoxic brain injury.  Jennelle Human, NP was notified.  [name] [name]

## 2020-11-15 NOTE — Progress Notes (Signed)
Patient eating ice chips at this time. RN made aware that patient was left on [place] at this time. Will continue to monitor.

## 2020-11-15 NOTE — Progress Notes (Signed)
IP rehab admissions - Noted change in status and now on the vent.  Will need plan for CIR at this time.  Call for questions.  [phone]

## 2020-11-15 NOTE — Progress Notes (Signed)
Approximately 23:45 advised patient with NPO with ice chips order, we will need to hold his ice chips  in order to place him back on bipap, patient is confused and continues to request water, continued to monitor.

## 2020-11-15 NOTE — Progress Notes (Signed)
   11/15/20 0037  Clinical Encounter Type  Visited With Patient not available  Visit Type Initial;Code  Referral From Nurse  Consult/Referral To Chaplain   Chaplain responded to Code Blue. Pt being treated and no support person present. Pt's family already contacted. No current need for chaplain. Chaplain remains available.   This note was prepared by Chaplain Resident, Dante Gang, MDiv. Chaplain remains available as needed through the on-call pager: [phone].

## 2020-11-15 NOTE — Progress Notes (Signed)
LTM EEG hooked up and running - no initial skin breakdown - push button tested - neuro notified. [facility] monitoring.  

## 2020-11-15 NOTE — Progress Notes (Signed)
I updated [name] at the bedside. She was at the hospital overnight and was updated regarding his care. She wants to continue aggressive care at this time to see if he can recover, but indicated he would not want to live dependent on machines forever. In the events of a cardiac arrest, he is DNR. RN present throughout discussion. Code status updated in the chart.  Patrick Hy, DO 11/15/20 12:48 PM [facility]

## 2020-11-15 NOTE — Progress Notes (Signed)
SLP Cancellation Note  Patient Details Name: Patrick Shelton MRN: [id number] DOB: 1940/07/07   Cancelled treatment:       Reason Eval/Treat Not Completed: Other (comment) (SLP was following pt, but pt has since been intubated and orders have been cancelled by PCCM. Please reconsult if clinically indicated.)  Dyer Klug I. Hardin Negus, [place], [place] Office number [phone] Pager [phone]  Horton Marshall 11/15/2020, 2:36 PM

## 2020-11-15 NOTE — Progress Notes (Signed)
RT note. Patient transported to CT and back on vent.

## 2020-11-15 NOTE — Progress Notes (Signed)
At approximately 00:25, while repositioning, patient went unresponsive and pulseless, code blue initiated, RRT notified began CPR.

## 2020-11-15 NOTE — Progress Notes (Signed)
Notified by Dr. 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   cEEG ongoing while on low dose propofol 10-20, therefore will dose with ativan '2mg'$  now and repeat as need to abate and load with keppra.  Will formally consult neuro to assist with AED/ seizure management and neuro prognostication.      Kennieth Rad, ACNP [facility] 11/15/2020, 11:30 AM  See Amion for pager If no response to pager, please call PCCM consult pager After 7:00 pm call Elink

## 2020-11-15 NOTE — Progress Notes (Signed)
EEG complete - results pending 

## 2020-11-15 NOTE — Progress Notes (Signed)
Nutrition Follow-up  DOCUMENTATION CODES:   Obesity unspecified  INTERVENTION:   If unable to extubate, recommend initiation of TF within 24 hours  Tube Feeding via OG:  Vital AF 1.2 at 65 ml/hr Provides 117 g of protein, 1872 kcals, 1264 mL of free water  TF regimen and propofol at current rate providing 2209 total kcal/day   Consider Cortrak placement given swallowing issues prior to intubation  NUTRITION DIAGNOSIS:   Increased nutrient needs related to wound healing as evidenced by estimated needs.  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  9/01 Admitted 9/06 L BKA 9/09 Cardiac Arrest, Intubated, Shock requiring pressors  Pt is a DNR but continuing aggressive care at this time Pt remains on vent support, requiring levophed, off vasopressin Propofol: 12.8 ml/hr  Neurology following, suspected anoxic injury, myoclonic seizures, continuous EEG currently, burst suppression  OG tube in place. Discussed nutrition plan with CCM, plan on holding initiation of TF today  No weight since 9/04  Pt has been experiencing issues with swallowing, failed swallow on 9/8 and made NPO  Currently on insulin drip to manage CBGs, pt has insulin pump   Labs: CBGs 156-349, Creatinine 2.60, BUN 61 Meds: colace, insulin drip, vitamin D3, miralax   Diet Order:   Diet Order             Diet NPO time specified  Diet effective now                   EDUCATION NEEDS:   Education needs have been addressed  Skin:  Skin Assessment: Reviewed RN Assessment  (Old R BKA, L foot 4th toe gangrene)  Last BM:  9/9  Height:   Ht Readings from Last 1 Encounters:  11/15/20 '5\' 10"'  (1.778 m)    Weight:   Wt Readings from Last 1 Encounters:  11/10/20 117.9 kg    BMI:  Body mass index is 37.29 kg/m.  Estimated Nutritional Needs:   Kcal:  1900-2100  Protein:  115-125 grams  Fluid:  > 1.9 L/day   Kerman Passey MS, RDN, LDN, CNSC Registered Dietitian III Clinical Nutrition RD Pager and On-Call Pager Number Located in [place]

## 2020-11-15 NOTE — Progress Notes (Signed)
Inpatient Diabetes Program Recommendations  AACE/ADA: New Consensus Statement on Inpatient Glycemic Control (2015)  Target Ranges:  Prepandial:   less than 140 mg/dL      Peak postprandial:   less than 180 mg/dL (1-2 hours)      Critically ill patients:  140 - 180 mg/dL   Lab Results  Component Value Date   GLUCAP 385 (H) 11/15/2020   HGBA1C 7.2 (H) 11/13/2020    Review of Glycemic Control Results for Patrick Shelton, Patrick Shelton (MRN [id number]) as of 11/15/2020 10:14  Ref. Range 11/14/2020 11:11 11/14/2020 17:03 11/14/2020 19:53 11/14/2020 21:26 11/15/2020 01:33 11/15/2020 04:05 11/15/2020 07:38  Glucose-Capillary Latest Ref Range: 70 - 99 mg/dL 153 (H) 236 (H) 246 (H) 274 (H) 274 (H) 332 (H) 385 (H)   Diabetes history: type 2? Outpatient Diabetes medications: Medtronic 630G insulin pump and Dexcom CGM Current orders for Inpatient glycemic control: insulin pump   Inpatient Diabetes Program Recommendations:     Endocrinologist: [facility] Dr. Grandville Silos   Insulin pump settings: (10/04/20) 00:00 3.85 U/hr.  0900  4.25 U/hr.  15:00  4.45 U/hr.  Total: 100.2 units basal  Bolus ICR: 3.0  ISF:8  Target glucose: 100-150 mg/dl Current orders for Inpatient glycemic control: Levemir 15 units bid,  Novolog resistant q 4 hours  Recommendations:     Note blood sugars increased today.  Patient received Novolog 20 units at 0800.  If blood sugars continue to be> 250 mg/dL, consider d/c of subcutaneous insulin and start of IV insulin.    Thanks,  Adah Perl, RN, BC-ADM Inpatient Diabetes Coordinator Pager [phone]  (8a-5p)

## 2020-11-15 NOTE — Progress Notes (Addendum)
  Progress Note    11/15/2020 7:52 AM 3 Days Post-Op  Subjective:  intubated and sedated   Vitals:   11/15/20 0742 11/15/20 0751  BP:    Pulse:    Resp:    Temp: 98.3 F (36.8 C) 98.3 F (36.8 C)  SpO2:      Physical Exam: Incisions:  L BKA incision c/d/I; viable skin edges and flap   CBC    Component Value Date/Time   WBC 23.3 (H) 11/15/2020 0135   RBC 3.62 (L) 11/15/2020 0135   HGB 10.9 (L) 11/15/2020 0207   HCT 32.0 (L) 11/15/2020 0207   PLT 250 11/15/2020 0135   MCV 98.3 11/15/2020 0135   MCH 30.1 11/15/2020 0135   MCHC 30.6 11/15/2020 0135   RDW 15.1 11/15/2020 0135   LYMPHSABS 6.4 (H) 11/15/2020 0102   MONOABS 1.0 11/15/2020 0102   EOSABS 0.0 11/15/2020 0102   BASOSABS 0.2 (H) 11/15/2020 0102    BMET    Component Value Date/Time   NA 145 11/15/2020 0207   K 5.0 11/15/2020 0207   CL 113 (H) 11/15/2020 0207   CO2 19 (L) 11/15/2020 0135   GLUCOSE 358 (H) 11/15/2020 0207   BUN 50 (H) 11/15/2020 0207   CREATININE 2.00 (H) 11/15/2020 0207   CALCIUM 9.2 11/15/2020 0135   GFRNONAA 29 (L) 11/15/2020 0135   GFRAA 34 (L) 04/03/2014 1901    INR    Component Value Date/Time   INR 2.0 05/24/2019 0000   INR 2.4 (A) 05/16/2014 0000     Intake/Output Summary (Last 24 hours) at 11/15/2020 [id number] Last data filed at 11/15/2020 0700 Gross per 24 hour  Intake 371.42 ml  Output --  Net 371.42 ml     Assessment/Plan:  80 y.o. male is s/p left below knee amputation  3 Days Post-Op  - L BKA appears to be well perfused - continue daily dressing changes - Call on call vascular surgeon if questions over the weekend   Dagoberto Ligas, PA-C [facility] [phone] 11/15/2020 7:52 AM  I have interviewed the patient and examined the patient. I agree with the findings by the PA. Left BKA healing well. Events of last night noted.   Gae Gallop, MD

## 2020-11-15 NOTE — Progress Notes (Signed)
LTM maint complete - no skin breakdown .

## 2020-11-15 NOTE — Anesthesia Procedure Notes (Signed)
Procedure Name: Intubation Date/Time: 11/15/2020 12:44 AM Performed by: Zackeriah Kissler T, CRNA Pre-anesthesia Checklist: Patient identified, Emergency Drugs available, Suction available and Patient being monitored Patient Re-evaluated:Patient Re-evaluated prior to induction Oxygen Delivery Method: Ambu bag Preoxygenation: Pre-oxygenation with 100% oxygen Ventilation: Mask ventilation without difficulty Laryngoscope Size: Mac and 3 Grade View: Grade II Tube type: Oral Tube size: 7.5 mm Number of attempts: 1 Airway Equipment and Method: Stylet Placement Confirmation: ETT inserted through vocal cords under direct vision, positive ETCO2, breath sounds checked- equal and bilateral and CO2 detector Secured at: 23 cm Tube secured with: Tape Dental Injury: Teeth and Oropharynx as per pre-operative assessment

## 2020-11-15 NOTE — Progress Notes (Signed)
ANTICOAGULATION CONSULT NOTE  Pharmacy Consult for heparin Indication: atrial fibrillation  Allergies  Allergen Reactions   Prednisone Anaphylaxis and Shortness Of Breath   Dulaglutide Nausea Only   Atorvastatin    Lovastatin     Other reaction(s): OTHER REACTION    Patient Measurements: Height: '5\' 10"'$  (177.8 cm) Weight: 121.3 kg (267 lb 6.7 oz) IBW/kg (Calculated) : 73 Heparin Dosing Weight: 99kg  Vital Signs: Temp: 98.6 F (37 C) (09/09 2000) Temp Source: Oral (09/09 1114) BP: 147/44 (09/09 2007) Pulse Rate: 54 (09/09 2007)  Labs: Recent Labs    11/13/20 1243 11/14/20 0108 11/14/20 1523 11/15/20 0102 11/15/20 0131 11/15/20 0135 11/15/20 0206 11/15/20 0207 11/15/20 0536 11/15/20 0902 11/15/20 0928 11/15/20 2027  HGB  --  11.1*  --  11.3*  --  10.9* 11.2* 10.9*  --   --  9.5*  --   HCT  --  36.0*  --  36.9*  --  35.6* 33.0* 32.0*  --   --  28.0*  --   PLT  --  248  --  267  --  250  --   --   --   --   --   --   APTT  --   --   --   --   --   --   --   --   --   --   --  94*  LABPROT  --   --   --   --   --   --   --   --   --  20.4*  --   --   INR  --   --   --   --   --   --   --   --   --  1.8*  --   --   HEPARINUNFRC 0.31 0.84*  --   --   --   --   --   --   --   --   --  1.06*  CREATININE  --  2.27*   < > 2.15*  --  2.22*  --  2.00*  --  2.60*  --   --   TROPONINIHS  --   --   --   --  38*  --   --   --  144* 193*  --   --    < > = values in this interval not displayed.    Estimated Creatinine Clearance: 29.6 mL/min (A) (by C-G formula based on SCr of 2.6 mg/dL (H)).   Assessment: 53 yoM on apixaban PTA for hx AF admitted with gangrenous toe s/p L BKA. Apixaban resumed 9/8, then pt suffered PEA arrest. Pharmacy asked to transition back to IV Heparin. CT Head negative for bleeding. CBC stable this morning, last dose 9/8 ~2100. Pt previously required ~2200 units/h but will begin infusion lower given acute illness and organ failure. Will likely need to dose  via APTTs for a few days while apixaban clears.  Initial heparin level after transition from Eliquis to IV Heparin is high as expected at 1.06. APTT is therapeutic at 94 on current rate and more accurate while Eliquis is still in his system.   Goal of Therapy:  Heparin level 0.3-0.7 units/ml aPTT 66-102 seconds Monitor platelets by anticoagulation protocol: Yes   Plan:  Continue Heparin at 1800 units/h  Follow-up daily Heparin level and aPTT  Sloan Leiter, PharmD, BCPS, BCCCP Clinical Pharmacist Please refer to [facility] for  [facility] Pharmacy numbers 11/15/2020

## 2020-11-15 NOTE — Progress Notes (Signed)
Notified by RN that pt in Code Blue without pulse and Code Blue team at bedside.  Went to bedside. Resident team in attendance for Code Blue.  Pt became unresponsive and found to have no pulse or respirations. CPR initiated.  Given multiple rounds of epinephrine and dose of sodium bicarb. ACLS protocol followed.  Intubated by CRNA After approximately 11-12 minutes pt regained pulse that was palpated in femoral and carotid arteries.  Pt was transferred to ICU.

## 2020-11-15 NOTE — Consult Note (Signed)
NAME:  Patrick Shelton, MRN:  [id number], DOB:  1941-02-05, LOS: 8 ADMISSION DATE:  10/26/2020, CONSULTATION DATE:  11/15/2020 REFERRING MD:  Dr. Tonie Griffith, CHIEF COMPLAINT:  Cardiac Arrest    History of Present Illness:  80 year old male presents to ED on 0000000 with complicated gangreof Left 4th toe. Vascular consulted. 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 Stay complicated by acute on chronic renal failure, hyperkalemia, encephalopathy, and respiratory distress.   9/8 patient with hypoxia and labored breathing with productive cough with white clear frothy sputum. Given lasix. 9/9 patient found unresponsive and pulseless. PEA. Required 15 minutes of CPR, 1 Calcium, 4 EPI, and 1 sodium Bicarb prior to ROSC. On arrival to ICU patient is hypotensive, bradycardiac, intubated, and unresponsive.   Pertinent  Medical History  DM, HTN, HLD, PAD, A.Fib on Eliquis, s/p Right BKA, now Left BKA   Significant Hospital Events: Including procedures, antibiotic start and stop dates in addition to other pertinent events   9/1 Presents to ED with suspected osteo of 4th toe to LLE   Interim History / Subjective:  As above   Objective   Blood pressure (!) 145/96, pulse 93, temperature 97.8 F (36.6 C), temperature source Oral, resp. rate 20, height '5\' 10"'$  (1.778 m), weight 117.9 kg, SpO2 100 %.    Vent Mode: PRVC FiO2 (%):  [40 %-100 %] 100 % Set Rate:  [16 bmp] 16 bmp Vt Set:  [580 mL] 580 mL PEEP:  [5 cmH20] 5 cmH20   Intake/Output Summary (Last 24 hours) at 11/15/2020 0125 Last data filed at 11/14/2020 [id number] Gross per 24 hour  Intake 671.1 ml  Output 950 ml  Net -278.9 ml   Filed Weights   10/24/2020 1954 11/10/20 1900  Weight: 117.9 kg 117.9 kg    Examination: General: Elderly male on vent HENT: ETT/OG in place  Lungs: Coarse breath sounds, no wheeze  Cardiovascular: Irregular, Brady, HR 52 Abdomen: Distended, hypoactive bowel sounds  Extremities: Bilaterally BKA  Neuro: unresponsive, pupils 2 mm and  sluggish, +gag, +corneal   GU: foley in place   Resolved Hospital Problem list     Assessment & Plan:   Encephalopathy, 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  Acute Hypoxic Respiratory Failure  -CXR with cardiomegaly with worsening perivascular haziness suspicious for edema, suspected small pleural effusions Plan -Continue Vent Support -Trend ABG/CXR -VAP Prevention Bundle order set in place   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 -S/P Rocephin/Flagyl/Vancomycin for 7 days  Plan -Trend WBC and Fever Curve -Cultures as above  -MRSA PCR pending   Anion Gap Metabolic Acidosis  Acute on Chronic Renal Failure  Hyperkalemia  Plan -Trend BMP -Replace electrolytes as indicated  -Avoid Nephrotoxic Agents  -Lactic Acid pending   Elevated LFT, suspect secondary to hypoperfusion  Plan  -Trend LFT  Hyperglycemia  DM Plan -Trend Glucose  -SSI  -Continue   Best Practice (right click and "Reselect all SmartList Selections"  daily)   Diet/type: NPO DVT prophylaxis: DOAC GI prophylaxis: PPI Lines: Central line Foley:  Yes, and it is still needed Code Status:  full code Last date of multidisciplinary goals of care discussion [pending]  Labs   CBC: Recent Labs  Lab 11/10/20 0517 11/11/20 0649 11/11/20 2236 11/13/20 0408 11/14/20 0108  WBC 20.7* 20.1* 19.5* 24.7* 25.3*  HGB 11.2* 11.0* 12.0* 10.6* 11.1*  HCT 34.2* 34.1*  37.1* 33.6* 36.0*  MCV 92.4 92.4 94.2 94.6 97.3  PLT 273 320 297 345 [id number]    Basic Metabolic Panel: Recent Labs  Lab 11/11/20 0203 12/02/2020 1833 11/13/20 0408 11/14/20 0108 11/14/20 1523  NA 135 135 138 142 140  K 4.9 5.9* 6.0* 6.0* 5.0  CL 104 103 106 109 107  CO2 20* '22 23 22 26  '$ GLUCOSE 139* 387* 210* 59* 233*  BUN 47* 39* 46* 52* 48*  CREATININE 1.85* 1.77* 2.07* 2.27* 2.11*  CALCIUM 8.8* 8.7* 9.0 8.8* 8.5*   GFR: Estimated Creatinine Clearance: 35.9 mL/min (A) (by C-G formula based on SCr of 2.11 mg/dL (H)). Recent Labs  Lab 11/11/20 0649 11/11/20 2236 11/13/20 0408 11/14/20 0108  WBC 20.1* 19.5* 24.7* 25.3*    Liver Function Tests: No results for input(s): AST, ALT, ALKPHOS, BILITOT, PROT, ALBUMIN in the last 168 hours. No results for input(s): LIPASE, AMYLASE in the last 168 hours. No results for input(s): AMMONIA in the last 168 hours.  ABG    Component Value Date/Time   PHART 7.240 (L) 11/14/2020 0514   PCO2ART 58.6 (H) 11/14/2020 0514   PO2ART 95.6 11/14/2020 0514   HCO3 24.4 11/14/2020 0514   ACIDBASEDEF 2.1 (H) 11/14/2020 0514   O2SAT 97.4 11/14/2020 0514     Coagulation Profile: No results for input(s): INR, PROTIME in the last 168 hours.  Cardiac Enzymes: No results for input(s): CKTOTAL, CKMB, CKMBINDEX, TROPONINI in the last 168 hours.  HbA1C: Hgb A1c MFr Bld  Date/Time Value Ref Range Status  11/13/2020 04:08 AM 7.2 (H) 4.8 - 5.6 % Final    Comment:    (NOTE) Pre diabetes:          5.7%-6.4%  Diabetes:              >6.4%  Glycemic control for   <7.0% adults with diabetes   05/12/2010 01:54 PM 8.9 %     CBG: Recent Labs  Lab 11/14/20 1020 11/14/20 1111 11/14/20 1703 11/14/20 1953 11/14/20 2126  GLUCAP 64* 153* 236* 246* 274*    Review of Systems:   Unable to review as patient is intubated and unresponsive   Past Medical History:  He,  has a past medical history of Arthritis (Sept. 2015), Arthritis (Sept. 2015),  Cancer [facility]), Chronic kidney disease, Diabetes mellitus, ED (erectile dysfunction), agent Orange exposure, Hyperlipidemia, Hypertension, PVD (peripheral vascular disease) ([place]), PVT (paroxysmal ventricular tachycardia) ([place]), and S/P BKA (below knee amputation) ([place]).   Surgical History:   Past Surgical History:  Procedure Laterality Date   AMPUTATION Right 2009   BKA   AMPUTATION Left 11/15/2020   Procedure: AMPUTATION BELOW KNEE;  Surgeon: Angelia Mould, MD;  Location: [facility] OR;  Service: Vascular;  Laterality: Left;   PROSTATE SURGERY     8 wks radiation     Social History:   reports that he quit smoking about 43 years ago. His smoking use included cigarettes. He has a 2.50 pack-year smoking history. He has never used smokeless tobacco. He reports that he does not drink alcohol  and does not use drugs.   Family History:  His family history includes Dementia in his mother; Diabetes in his father; Peripheral vascular disease in his father.   Allergies Allergies  Allergen Reactions   Prednisone Anaphylaxis and Shortness Of Breath   Dulaglutide Nausea Only   Atorvastatin    Lovastatin     Other reaction(s): OTHER REACTION     Home Medications  Prior to Admission medications   Medication Sig Start Date End Date Taking? Authorizing Provider  acetaminophen (TYLENOL) 500 MG tablet Take 1,000 mg by mouth every 6 (six) hours as needed for moderate pain.   Yes [provider]  allopurinol (ZYLOPRIM) 100 MG tablet Take 100 mg by mouth daily. 03/15/18  Yes [provider]  amLODipine (NORVASC) 5 MG tablet TAKE 1 TABLET(5 MG) BY MOUTH DAILY Patient taking differently: Take 5 mg by mouth daily. 06/13/20  Yes Croitoru, Mihai, MD  apixaban (ELIQUIS) 5 MG TABS tablet Take 1 tablet (5 mg total) by mouth 2 (two) times daily. 05/18/19  Yes Croitoru, Mihai, MD  B Complex Vitamins (B COMPLEX PO) Take 1 tablet by mouth daily.    Yes [provider]  Cholecalciferol (VITAMIN  D-3) 5000 UNITS TABS Take 5,000 Units by mouth daily.    Yes [provider]  colchicine 0.6 MG tablet Take 0.6 mg by mouth daily as needed (gout flare).   Yes [provider]  Glucagon 3 MG/DOSE POWD Place 3 mg into the nose as needed (low blood sugar).   Yes [provider]  hydrochlorothiazide 25 MG tablet Take 25 mg by mouth daily.   Yes [provider]  losartan (COZAAR) 100 MG tablet Take 100 mg by mouth daily. 03/15/18  Yes [provider]  NOVOLOG 100 UNIT/ML injection Inject into the skin continuous. Via insulin pump 08/19/15  Yes [provider]  OVER THE COUNTER MEDICATION Take 1 tablet by mouth daily. Fiberwell gummy   Yes [provider]  pravastatin (PRAVACHOL) 40 MG tablet Take 40 mg by mouth daily.   Yes [provider]  VOLTAREN 1 % GEL Apply 2-4 g topically 4 (four) times daily as needed (pain). 11/05/17  Yes [provider]  CONTOUR NEXT TEST test strip TEST BLOOD SUGAR LEVELS QID 02/11/18   [provider]     Critical care time: 55 minutes     CRITICAL CARE Performed by: Omar Person   Total critical care time: 55 minutes  Critical care time was exclusive of separately billable procedures and treating other patients.  Critical care was necessary to treat or prevent imminent or life-threatening deterioration.  Critical care was time spent personally by me on the following activities: development of treatment plan with patient and/or surrogate as well as nursing, discussions with consultants, evaluation of patient's response to treatment, examination of patient, obtaining history from patient or surrogate, ordering and performing treatments and interventions, ordering and review of laboratory studies, ordering and review of radiographic studies, pulse oximetry and re-evaluation of patient's condition.  Hayden Pedro, AGACNP-BC [facility]  PCCM Pgr:  [phone]

## 2020-11-15 NOTE — Procedures (Signed)
Arterial Catheter Insertion Procedure Note  Patrick Shelton  [id number]  March 11, 1940  Date:11/15/20  Time:3:32 AM    Provider Performing: Omar Person    Procedure: Insertion of Arterial Line [id number]) with US guidance [id number])   Indication(s) Blood pressure monitoring and/or need for frequent ABGs  Consent Unable to obtain consent due to emergent nature of procedure.  Anesthesia None   Time Out Verified patient identification, verified procedure, site/side was marked, verified correct patient position, special equipment/implants available, medications/allergies/relevant history reviewed, required imaging and test results available.   Sterile Technique Maximal sterile technique including full sterile barrier drape, hand hygiene, sterile gown, sterile gloves, mask, hair covering, sterile ultrasound probe cover (if used).   Procedure Description Area of catheter insertion was cleaned with chlorhexidine and draped in sterile fashion. With real-time ultrasound guidance an arterial catheter was placed into the left femoral artery.  Appropriate arterial tracings confirmed on monitor.     Complications/Tolerance None; patient tolerated the procedure well.   EBL Minimal   Specimen(s) None

## 2020-11-15 NOTE — Progress Notes (Signed)
eLink Physician-Brief Progress Note Patient Name: Patrick Shelton DOB: 1940/12/05 MRN: [id number]   Date of Service  11/15/2020  HPI/Events of Note  Patient transferred to the ICU s/p cardiac arrest on the medical ward, approximately 12 minutes of CPR + ACLS protocol before ROSC, intubated by CRNA during the code, and transported to the ICU where he is on the ventilator.  eICU Interventions  New Patient Evaluation.        Kerry Kass Korde Jeppsen 11/15/2020, 2:19 AM

## 2020-11-16 DIAGNOSIS — N179 Acute kidney failure, unspecified: Secondary | ICD-10-CM | POA: Diagnosis not present

## 2020-11-16 DIAGNOSIS — G931 Anoxic brain damage, not elsewhere classified: Secondary | ICD-10-CM | POA: Diagnosis not present

## 2020-11-16 DIAGNOSIS — G253 Myoclonus: Secondary | ICD-10-CM

## 2020-11-16 DIAGNOSIS — Z9911 Dependence on respirator [ventilator] status: Secondary | ICD-10-CM | POA: Diagnosis not present

## 2020-11-16 DIAGNOSIS — I469 Cardiac arrest, cause unspecified: Secondary | ICD-10-CM

## 2020-11-16 DIAGNOSIS — J9601 Acute respiratory failure with hypoxia: Secondary | ICD-10-CM | POA: Diagnosis not present

## 2020-11-16 DIAGNOSIS — G40409 Other generalized epilepsy and epileptic syndromes, not intractable, without status epilepticus: Secondary | ICD-10-CM

## 2020-11-16 LAB — GLUCOSE, CAPILLARY
Glucose-Capillary: 103 mg/dL — ABNORMAL HIGH (ref 70–99)
Glucose-Capillary: 107 mg/dL — ABNORMAL HIGH (ref 70–99)
Glucose-Capillary: 132 mg/dL — ABNORMAL HIGH (ref 70–99)
Glucose-Capillary: 133 mg/dL — ABNORMAL HIGH (ref 70–99)
Glucose-Capillary: 138 mg/dL — ABNORMAL HIGH (ref 70–99)
Glucose-Capillary: 143 mg/dL — ABNORMAL HIGH (ref 70–99)
Glucose-Capillary: 144 mg/dL — ABNORMAL HIGH (ref 70–99)
Glucose-Capillary: 146 mg/dL — ABNORMAL HIGH (ref 70–99)
Glucose-Capillary: 148 mg/dL — ABNORMAL HIGH (ref 70–99)
Glucose-Capillary: 148 mg/dL — ABNORMAL HIGH (ref 70–99)
Glucose-Capillary: 149 mg/dL — ABNORMAL HIGH (ref 70–99)
Glucose-Capillary: 188 mg/dL — ABNORMAL HIGH (ref 70–99)
Glucose-Capillary: 204 mg/dL — ABNORMAL HIGH (ref 70–99)
Glucose-Capillary: 262 mg/dL — ABNORMAL HIGH (ref 70–99)
Glucose-Capillary: 295 mg/dL — ABNORMAL HIGH (ref 70–99)
Glucose-Capillary: 360 mg/dL — ABNORMAL HIGH (ref 70–99)

## 2020-11-16 LAB — POCT I-STAT 7, (LYTES, BLD GAS, ICA,H+H)
Acid-Base Excess: 2 mmol/L (ref 0.0–2.0)
Acid-base deficit: 2 mmol/L (ref 0.0–2.0)
Bicarbonate: 22.9 mmol/L (ref 20.0–28.0)
Bicarbonate: 22 mmol/L (ref 20.0–28.0)
Calcium, Ion: 1.18 mmol/L (ref 1.15–1.40)
Calcium, Ion: 1.21 mmol/L (ref 1.15–1.40)
HCT: 31 % — ABNORMAL LOW (ref 39.0–52.0)
HCT: 50 % (ref 39.0–52.0)
Hemoglobin: 10.5 g/dL — ABNORMAL LOW (ref 13.0–17.0)
Hemoglobin: 17 g/dL (ref 13.0–17.0)
O2 Saturation: 100 %
O2 Saturation: 97 %
Patient temperature: 36.9
Patient temperature: 36.6
Potassium: 3.7 mmol/L (ref 3.5–5.1)
Potassium: 3.8 mmol/L (ref 3.5–5.1)
Sodium: 151 mmol/L — ABNORMAL HIGH (ref 135–145)
Sodium: 150 mmol/L — ABNORMAL HIGH (ref 135–145)
TCO2: 24 mmol/L (ref 22–32)
TCO2: 23 mmol/L (ref 22–32)
pCO2 arterial: 24 mmHg — ABNORMAL LOW (ref 32.0–48.0)
pCO2 arterial: 34.7 mmHg (ref 32.0–48.0)
pH, Arterial: 7.587 — ABNORMAL HIGH (ref 7.350–7.450)
pH, Arterial: 7.408 (ref 7.350–7.450)
pO2, Arterial: 142 mmHg — ABNORMAL HIGH (ref 83.0–108.0)
pO2, Arterial: 89 mmHg (ref 83.0–108.0)

## 2020-11-16 LAB — HEPARIN LEVEL (UNFRACTIONATED)
Heparin Unfractionated: 0.83 IU/mL — ABNORMAL HIGH (ref 0.30–0.70)
Heparin Unfractionated: 0.75 IU/mL — ABNORMAL HIGH (ref 0.30–0.70)

## 2020-11-16 LAB — COMPREHENSIVE METABOLIC PANEL
ALT: 414 U/L — ABNORMAL HIGH (ref 0–44)
AST: 298 U/L — ABNORMAL HIGH (ref 15–41)
Albumin: 1.8 g/dL — ABNORMAL LOW (ref 3.5–5.0)
Alkaline Phosphatase: 66 U/L (ref 38–126)
Anion gap: 8 (ref 5–15)
BUN: 63 mg/dL — ABNORMAL HIGH (ref 8–23)
CO2: 22 mmol/L (ref 22–32)
Calcium: 8.6 mg/dL — ABNORMAL LOW (ref 8.9–10.3)
Chloride: 114 mmol/L — ABNORMAL HIGH (ref 98–111)
Creatinine, Ser: 2.69 mg/dL — ABNORMAL HIGH (ref 0.61–1.24)
GFR, Estimated: 23 mL/min — ABNORMAL LOW (ref >60)
Glucose, Bld: 194 mg/dL — ABNORMAL HIGH (ref 70–99)
Potassium: 3.7 mmol/L (ref 3.5–5.1)
Sodium: 144 mmol/L (ref 135–145)
Total Bilirubin: 0.9 mg/dL (ref 0.3–1.2)
Total Protein: 5.5 g/dL — ABNORMAL LOW (ref 6.5–8.1)

## 2020-11-16 LAB — APTT
aPTT: 132 seconds — ABNORMAL HIGH (ref 24–36)
aPTT: 153 seconds — ABNORMAL HIGH (ref 24–36)
aPTT: 120 seconds — ABNORMAL HIGH (ref 24–36)

## 2020-11-16 LAB — CBC
HCT: 28.4 % — ABNORMAL LOW (ref 39.0–52.0)
Hemoglobin: 9.5 g/dL — ABNORMAL LOW (ref 13.0–17.0)
MCH: 29.8 pg (ref 26.0–34.0)
MCHC: 33.5 g/dL (ref 30.0–36.0)
MCV: 89 fL (ref 80.0–100.0)
Platelets: 222 10*3/uL (ref 150–400)
RBC: 3.19 MIL/uL — ABNORMAL LOW (ref 4.22–5.81)
RDW: 14.1 % (ref 11.5–15.5)
WBC: 18.2 10*3/uL — ABNORMAL HIGH (ref 4.0–10.5)
nRBC: 0.1 % (ref 0.0–0.2)

## 2020-11-16 LAB — TRIGLYCERIDES: Triglycerides: 64 mg/dL (ref <150)

## 2020-11-16 LAB — PHOSPHORUS: Phosphorus: 1.6 mg/dL — ABNORMAL LOW (ref 2.5–4.6)

## 2020-11-16 LAB — AMMONIA: Ammonia: 25 umol/L (ref 9–35)

## 2020-11-16 LAB — MAGNESIUM: Magnesium: 1.6 mg/dL — ABNORMAL LOW (ref 1.7–2.4)

## 2020-11-16 MED ORDER — PROSOURCE TF PO LIQD: 45.0000 mL | Freq: Two times a day (BID) | ORAL | Status: DC

## 2020-11-16 MED ORDER — VITAL AF 1.2 CAL PO LIQD
1000.0000 mL | ORAL | Status: DC
  Administered 2020-11-17 – 2020-11-19 (×4): 1000 mL

## 2020-11-16 MED ORDER — INSULIN ASPART 100 UNIT/ML IJ SOLN
2.0000 [IU] | INTRAMUSCULAR | Status: DC
  Administered 2020-11-16 – 2020-11-17 (×7): 2 [IU] via SUBCUTANEOUS

## 2020-11-16 MED ORDER — POTASSIUM & SODIUM PHOSPHATES 280-160-250 MG PO PACK
2.0000 | PACK | Freq: Four times a day (QID) | ORAL | Status: AC
  Administered 2020-11-16 (×2): 2 via ORAL
  Filled 2020-11-16 (×2): qty 2

## 2020-11-16 MED ORDER — VITAL HIGH PROTEIN PO LIQD: 1000.0000 mL | ORAL | Status: DC

## 2020-11-16 MED ORDER — VITAL HIGH PROTEIN PO LIQD
1000.0000 mL | ORAL | Status: DC
  Administered 2020-11-16: 1000 mL

## 2020-11-16 MED ORDER — DEXTROSE 10 % IV SOLN: INTRAVENOUS | Status: DC | PRN

## 2020-11-16 MED ORDER — INSULIN DETEMIR 100 UNIT/ML ~~LOC~~ SOLN
8.0000 [IU] | Freq: Two times a day (BID) | SUBCUTANEOUS | Status: DC
  Administered 2020-11-16 (×2): 8 [IU] via SUBCUTANEOUS
  Filled 2020-11-16 (×4): qty 0.08

## 2020-11-16 MED ORDER — MAGNESIUM SULFATE 2 GM/50ML IV SOLN
2.0000 g | Freq: Once | INTRAVENOUS | Status: AC
  Administered 2020-11-16: 2 g via INTRAVENOUS
  Filled 2020-11-16: qty 50

## 2020-11-16 MED ORDER — INSULIN ASPART 100 UNIT/ML IJ SOLN
2.0000 [IU] | INTRAMUSCULAR | Status: DC
  Administered 2020-11-16: 3 [IU] via SUBCUTANEOUS
  Administered 2020-11-16: 6 [IU] via SUBCUTANEOUS
  Administered 2020-11-16: 2 [IU] via SUBCUTANEOUS

## 2020-11-16 MED ORDER — ADULT MULTIVITAMIN W/MINERALS CH
1.0000 | ORAL_TABLET | Freq: Every day | ORAL | Status: DC
  Administered 2020-11-16 – 2020-11-26 (×11): 1
  Filled 2020-11-16 (×11): qty 1

## 2020-11-16 MED ORDER — INSULIN ASPART 100 UNIT/ML IJ SOLN
0.0000 [IU] | INTRAMUSCULAR | Status: DC
  Administered 2020-11-16: 8 [IU] via SUBCUTANEOUS
  Administered 2020-11-17: 5 [IU] via SUBCUTANEOUS
  Administered 2020-11-17 (×2): 15 [IU] via SUBCUTANEOUS
  Administered 2020-11-17: 11 [IU] via SUBCUTANEOUS

## 2020-11-16 NOTE — Progress Notes (Addendum)
Nutrition Follow-up  DOCUMENTATION CODES:   Obesity unspecified  INTERVENTION:   Initiate tube feeding via OGT: Vital AF 1.2 at 65 ml/h (1560 ml per day)  Provides 1872 kcal (2477 kcal total with current propofol rate), 117 gm protein, 1265 ml free water daily.  Multivitamin with minerals via tube once daily.  Continue to monitor and supplement K, Phos, and Mag as needed.   Consider Cortrak placement given swallowing issues prior to intubation.  NUTRITION DIAGNOSIS:   Increased nutrient needs related to wound healing as evidenced by estimated needs.  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  9/01 Admitted 9/06 L BKA 9/09 Cardiac Arrest, Intubated, Shock requiring pressors  Remains on TTM normothermia. Transitioning off insulin drip today.  OG tube in place. Received MD Consult for TF initiation and management.  Patient remains intubated on ventilator support d/t BLL PNA.  MV: 9.4 L/min Temp (24hrs), Avg:98.4 F (36.9 C), Min:96.6 F (35.9 C), Max:100.3 F (37.9 C)  Propofol: 21 ml/hr providing 605 kcal per day from lipid  Labs reviewed. Na 150, phos 1.6, mag 1.6 CBG: [phone]  Medications reviewed and include cholecalciferol, Colace, Novolog, Levemir, Protonix, Miralax, Phos-Nak, insulin drip, Levophed, Keppra, propofol.   Current weight 121.3 kg (9/9) Admission weight 117.9 kg (8/31)  I/O +9.7 L since admission  Diet Order:   Diet Order             Diet NPO time specified  Diet effective  now                   EDUCATION NEEDS:   Education needs have been addressed  Skin:  Skin Assessment: Skin Integrity Issues: Skin Integrity Issues:: Incisions Incisions: L BKA 9/6  Last BM:  9/9 type 6  Height:   Ht Readings from Last 1 Encounters:  11/15/20 '5\' 10"'$  (1.778 m)    Weight:   Wt Readings from Last 1 Encounters:  11/15/20 121.3 kg    BMI:  Body mass index is 38.37 kg/m.  Estimated Nutritional Needs:   Kcal:  1900-2100  Protein:  115-145 gm  Fluid:  > 1.9 L/day   Lucas Mallow, RD, LDN, CNSC Please refer to Amion for contact information.

## 2020-11-16 NOTE — Progress Notes (Signed)
Subjective: Now opens eyes with stimulation.   Objective: Current vital signs: BP (!) 151/63   Pulse (!) 58   Temp 98.2 F (36.8 C)   Resp (!) 24   Ht '5\' 10"'  (1.778 m)   Wt 121.3 kg   SpO2 100%   BMI 38.37 kg/m  Vital signs in last 24 hours: Temp:  [96.6 F (35.9 C)-100.3 F (37.9 C)] 98.2 F (36.8 C) (09/10 0600) Pulse Rate:  [44-83] 58 (09/10 0600) Resp:  [6-29] 24 (09/10 0417) BP: (100-173)/(40-122) 151/63 (09/10 0417) SpO2:  [92 %-100 %] 100 % (09/10 0600) Arterial Line BP: (123-208)/(33-68) 162/47 (09/10 0600) FiO2 (%):  [40 %-60 %] 40 % (09/10 0417) Weight:  [121.3 kg] 121.3 kg (09/09 1845)  Intake/Output from previous day: 09/09 0701 - 09/10 0700 In: 1232.1 [I.V.:829.3; IV Piggyback:402.8] Out: 8546 [Urine:1080; Stool:1] Intake/Output this shift: No intake/output data recorded. Nutritional status:  Diet Order             Diet NPO time specified  Diet effective now                  HEENT: [facility]/AT Lungs: Intubated Ext: Bilateral BKA noted.   Neurologic Exam: Ment: Propofol sedation at a rate of 30 was held for 10 minutes prior to neurological exam. Opens eyes partially after sustained noxious stimulation. No response to auditory stimuli. No purposeful movements but will flex BLE weakly to noxious.  CN: PERRL 2 mm. Subtle blink response to corneal stimulation bilaterally. No doll's eye reflex detectable. Face flaccidly symmetric. Cough reflex requires deep stimulation to be triggered.  Motor/Sensory: Tone decreased x 4. No movement of upper extremities to noxious. Will flex BLE weakly at hips and knees to noxious. Reflexes: No asymmetry.  Cerebellar/Gait: Unable to assess  Lab Results: Results for orders placed or performed during the hospital encounter of 10/30/2020 (from the past 48 hour(s))  Glucose, capillary     Status: Abnormal   Collection Time: 11/14/20 10:20 AM  Result Value Ref Range   Glucose-Capillary 64 (L) 70 - 99 mg/dL    Comment: Glucose  reference range applies only to samples taken after fasting for at least 8 hours.  Glucose, capillary     Status: Abnormal   Collection Time: 11/14/20 11:11 AM  Result Value Ref Range   Glucose-Capillary 153 (H) 70 - 99 mg/dL    Comment: Glucose reference range applies only to samples taken after fasting for at least 8 hours.  Basic metabolic panel     Status: Abnormal   Collection Time: 11/14/20  3:23 PM  Result Value Ref Range   Sodium 140 135 - 145 mmol/L   Potassium 5.0 3.5 - 5.1 mmol/L   Chloride 107 98 - 111 mmol/L   CO2 26 22 - 32 mmol/L   Glucose, Bld 233 (H) 70 - 99 mg/dL    Comment: Glucose reference range applies only to samples taken after fasting for at least 8 hours.   BUN 48 (H) 8 - 23 mg/dL   Creatinine, Ser 2.11 (H) 0.61 - 1.24 mg/dL   Calcium 8.5 (L) 8.9 - 10.3 mg/dL   GFR, Estimated 31 (L) >60 mL/min    Comment: (NOTE) Calculated using the CKD-EPI Creatinine Equation (2021)    Anion gap 7 5 - 15    Comment: Performed at [facility] [address] [place], [place] [address]  Glucose, capillary     Status: Abnormal   Collection Time: 11/14/20  5:03 PM  Result Value Ref Range   Glucose-Capillary 236 (H) 70 - 99 mg/dL    Comment: Glucose reference range applies only to samples taken after fasting for at least 8 hours.  Glucose, capillary     Status: Abnormal   Collection Time: 11/14/20  7:53 PM  Result Value Ref Range   Glucose-Capillary 246 (H) 70 - 99 mg/dL    Comment: Glucose reference range applies only to samples taken after fasting for at least 8 hours.  Glucose, capillary     Status: Abnormal   Collection Time: 11/14/20  9:26 PM  Result Value Ref Range   Glucose-Capillary 274 (H) 70 - 99 mg/dL    Comment: Glucose reference range applies only to samples taken after fasting for at least 8 hours.  CBC with Differential/Platelet     Status: Abnormal   Collection Time: 11/15/20  1:02 AM  Result Value Ref Range   WBC 26.0 (H) 4.0 - 10.5 K/uL   RBC  3.71 (L) 4.22 - 5.81 MIL/uL   Hemoglobin 11.3 (L) 13.0 - 17.0 g/dL   HCT 36.9 (L) 39.0 - 52.0 %   MCV 99.5 80.0 - 100.0 fL   MCH 30.5 26.0 - 34.0 pg   MCHC 30.6 30.0 - 36.0 g/dL   RDW 15.1 11.5 - 15.5 %   Platelets 267 150 - 400 K/uL   nRBC 0.2 0.0 - 0.2 %   Neutrophils Relative % 65 %   Neutro Abs 17.1 (H) 1.7 - 7.7 K/uL   Lymphocytes Relative 25 %   Lymphs Abs 6.4 (H) 0.7 - 4.0 K/uL   Monocytes Relative 4 %   Monocytes Absolute 1.0 0.1 - 1.0 K/uL   Eosinophils Relative 0 %   Eosinophils Absolute 0.0 0.0 - 0.5 K/uL   Basophils Relative 1 %   Basophils Absolute 0.2 (H) 0.0 - 0.1 K/uL   Immature Granulocytes 5 %   Abs Immature Granulocytes 1.29 (H) 0.00 - 0.07 K/uL    Comment: Performed at [facility], [address] [place], [place] [address]  Comprehensive metabolic panel     Status: Abnormal   Collection Time: 11/15/20  1:02 AM  Result Value Ref Range   Sodium 143 135 - 145 mmol/L   Potassium 5.4 (H) 3.5 - 5.1 mmol/L   Chloride 109 98 - 111 mmol/L   CO2 20 (L) 22 - 32 mmol/L   Glucose, Bld 321 (H) 70 - 99 mg/dL    Comment: Glucose reference range applies only to samples taken after fasting for at least 8 hours.   BUN 52 (H) 8 - 23 mg/dL   Creatinine, Ser 2.15 (H) 0.61 - 1.24 mg/dL   Calcium 9.3 8.9 - 10.3 mg/dL   Total Protein 6.5 6.5 - 8.1 g/dL   Albumin 2.2 (L) 3.5 - 5.0 g/dL   AST 918 (H) 15 - 41 U/L   ALT 452 (H) 0 - 44 U/L   Alkaline Phosphatase 95 38 - 126 U/L   Total Bilirubin 0.6 0.3 - 1.2 mg/dL   GFR, Estimated 30 (L) >60 mL/min    Comment: (NOTE) Calculated using the CKD-EPI Creatinine Equation (2021)    Anion gap 14 5 - 15    Comment: Performed at [facility], [place] [address] [place], [place] [address]  Magnesium     Status: None   Collection Time: 11/15/20  1:02 AM  Result Value Ref Range   Magnesium 2.0 1.7 - 2.4 mg/dL    Comment: Performed  at [facility], [employer] [employer] [address] [place], [place] [address]  Lactic acid, plasma      Status: Abnormal   Collection Time: 11/15/20  1:02 AM  Result Value Ref Range   Lactic Acid, Venous 6.1 (HH) 0.5 - 1.9 mmol/L    Comment: CRITICAL RESULT CALLED TO, READ BACK BY AND VERIFIED WITH: K.ASERE, RN. '@[id number]'  [id number] BLANKENSHIP R.  Performed at [facility], [place] [address] [place], [place] [address]   MRSA Next Gen by PCR, Nasal     Status: None   Collection Time: 11/15/20  1:30 AM   Specimen: Nasal Mucosa; Nasal Swab  Result Value Ref Range   MRSA by PCR Next Gen NOT DETECTED NOT DETECTED    Comment: (NOTE) The GeneXpert MRSA Assay (FDA approved for NASAL specimens only), is one component of a comprehensive MRSA colonization surveillance program. It is not intended to diagnose MRSA infection nor to guide or monitor treatment for MRSA infections. Test performance is not FDA approved in patients less than 26 years old. Performed at [facility], [place] [address], [place], [place] [address]   Ammonia     Status: Abnormal   Collection Time: 11/15/20  1:31 AM  Result Value Ref Range   Ammonia 68 (H) 9 - 35 umol/L    Comment: Performed at [facility], [place] [address] [place], [place] [address]  Troponin I (High Sensitivity)     Status: Abnormal   Collection Time: 11/15/20  1:31 AM  Result Value Ref Range   Troponin I (High Sensitivity) 38 (H) <18 ng/L    Comment: (NOTE) Elevated high sensitivity troponin I (hsTnI) values and significant  changes across serial measurements may suggest ACS but many other  chronic and acute conditions are known to elevate hsTnI results.  Refer to the "Links" section for chest pain algorithms and additional  guidance. Performed at [facility], [place] [address], [place], [place] [address]   Glucose, capillary     Status: Abnormal   Collection Time: 11/15/20  1:33 AM  Result Value Ref Range   Glucose-Capillary 274 (H) 70 - 99 mg/dL    Comment: Glucose reference range applies only to samples taken after fasting for at  least 8 hours.  CBC     Status: Abnormal   Collection Time: 11/15/20  1:35 AM  Result Value Ref Range   WBC 23.3 (H) 4.0 - 10.5 K/uL   RBC 3.62 (L) 4.22 - 5.81 MIL/uL   Hemoglobin 10.9 (L) 13.0 - 17.0 g/dL   HCT 35.6 (L) 39.0 - 52.0 %   MCV 98.3 80.0 - 100.0 fL   MCH 30.1 26.0 - 34.0 pg   MCHC 30.6 30.0 - 36.0 g/dL   RDW 15.1 11.5 - 15.5 %   Platelets 250 150 - 400 K/uL   nRBC 0.2 0.0 - 0.2 %    Comment: Performed at [facility], [place] [address] [place], [place] [address]  Basic metabolic panel     Status: Abnormal   Collection Time: 11/15/20  1:35 AM  Result Value Ref Range   Sodium 143 135 - 145 mmol/L   Potassium 5.2 (H) 3.5 - 5.1 mmol/L   Chloride 108 98 - 111 mmol/L   CO2 19 (L) 22 - 32 mmol/L   Glucose, Bld 333 (H) 70 - 99 mg/dL    Comment: Glucose reference range applies only to samples taken after fasting for at least 8 hours.  BUN 52 (H) 8 - 23 mg/dL   Creatinine, Ser 2.22 (H) 0.61 - 1.24 mg/dL   Calcium 9.2 8.9 - 10.3 mg/dL   GFR, Estimated 29 (L) >60 mL/min    Comment: (NOTE) Calculated using the CKD-EPI Creatinine Equation (2021)    Anion gap 16 (H) 5 - 15    Comment: Performed at [facility] [address], [place], [place] [address]  I-STAT 7, (LYTES, BLD GAS, ICA, H+H)     Status: Abnormal   Collection Time: 11/15/20  2:06 AM  Result Value Ref Range   pH, Arterial 7.295 (L) 7.350 - 7.450   pCO2 arterial 44.7 32.0 - 48.0 mmHg   pO2, Arterial 256 (H) 83.0 - 108.0 mmHg   Bicarbonate 21.7 20.0 - 28.0 mmol/L   TCO2 23 22 - 32 mmol/L   O2 Saturation 100.0 %   Acid-base deficit 5.0 (H) 0.0 - 2.0 mmol/L   Sodium 145 135 - 145 mmol/L   Potassium 5.1 3.5 - 5.1 mmol/L   Calcium, Ion 1.27 1.15 - 1.40 mmol/L   HCT 33.0 (L) 39.0 - 52.0 %   Hemoglobin 11.2 (L) 13.0 - 17.0 g/dL   Patient temperature 98.6 F    Collection site Radial    Drawn by RT    Sample type ARTERIAL   I-STAT, chem 8     Status: Abnormal   Collection Time: 11/15/20  2:07 AM   Result Value Ref Range   Sodium 145 135 - 145 mmol/L   Potassium 5.0 3.5 - 5.1 mmol/L   Chloride 113 (H) 98 - 111 mmol/L   BUN 50 (H) 8 - 23 mg/dL   Creatinine, Ser 2.00 (H) 0.61 - 1.24 mg/dL   Glucose, Bld 358 (H) 70 - 99 mg/dL    Comment: Glucose reference range applies only to samples taken after fasting for at least 8 hours.   Calcium, Ion 1.28 1.15 - 1.40 mmol/L   TCO2 23 22 - 32 mmol/L   Hemoglobin 10.9 (L) 13.0 - 17.0 g/dL   HCT 32.0 (L) 39.0 - 52.0 %  Glucose, capillary     Status: Abnormal   Collection Time: 11/15/20  4:05 AM  Result Value Ref Range   Glucose-Capillary 332 (H) 70 - 99 mg/dL    Comment: Glucose reference range applies only to samples taken after fasting for at least 8 hours.  Culture, blood (routine x 2)     Status: None (Preliminary result)   Collection Time: 11/15/20  5:36 AM   Specimen: BLOOD LEFT HAND  Result Value Ref Range   Specimen Description BLOOD LEFT HAND    Special Requests      AEROBIC BOTTLE ONLY Blood Culture results may not be optimal due to an excessive volume of blood received in culture bottles   Culture      NO GROWTH < 12 HOURS Performed at [facility] [address], [place], [place] [address]    Report Status PENDING   Culture, blood (routine x 2)     Status: None (Preliminary result)   Collection Time: 11/15/20  5:36 AM   Specimen: BLOOD RIGHT HAND  Result Value Ref Range   Specimen Description BLOOD RIGHT HAND    Special Requests      AEROBIC BOTTLE ONLY Blood Culture results may not be optimal due to an excessive volume of blood received in culture bottles   Culture      NO GROWTH < 12 HOURS Performed at [facility], [place] [address] [place], [place] [address]    Report Status PENDING   Troponin I (High Sensitivity)     Status: Abnormal   Collection Time: 11/15/20  5:36 AM  Result Value Ref Range   Troponin I (High Sensitivity) 144 (HH) <18 ng/L    Comment: CRITICAL RESULT CALLED TO, READ BACK BY AND  VERIFIED WITH: K.ASARE,RN [id number] 11/15/20 CLARK,S (NOTE) Elevated high sensitivity troponin I (hsTnI) values and significant  changes across serial measurements may suggest ACS but many other  chronic and acute conditions are known to elevate hsTnI results.  Refer to the Links section for chest pain algorithms and additional  guidance. Performed at [facility], [place] [address], [place], [place] [address]   Lactic acid, plasma     Status: Abnormal   Collection Time: 11/15/20  5:36 AM  Result Value Ref Range   Lactic Acid, Venous 4.5 (HH) 0.5 - 1.9 mmol/L    Comment: CRITICAL VALUE NOTED.  VALUE IS CONSISTENT WITH PREVIOUSLY REPORTED AND CALLED VALUE. Performed at [facility], [place] [address] [place], [place] [address]   Glucose, capillary     Status: Abnormal   Collection Time: 11/15/20  7:38 AM  Result Value Ref Range   Glucose-Capillary 385 (H) 70 - 99 mg/dL    Comment: Glucose reference range applies only to samples taken after fasting for at least 8 hours.  Culture, Respiratory w Gram Stain     Status: None (Preliminary result)   Collection Time: 11/15/20  8:51 AM   Specimen: Tracheal Aspirate; Respiratory  Result Value Ref Range   Specimen Description TRACHEAL ASPIRATE    Special Requests NONE    Gram Stain      FEW SQUAMOUS EPITHELIAL CELLS PRESENT FEW WBC SEEN RARE GRAM POSITIVE COCCI Performed at [facility], [place] [address], [place], [place] [address]    Culture PENDING    Report Status PENDING   Lactic acid, plasma     Status: Abnormal   Collection Time: 11/15/20  8:59 AM  Result Value Ref Range   Lactic Acid, Venous 3.4 (HH) 0.5 - 1.9 mmol/L    Comment: CRITICAL VALUE NOTED.  VALUE IS CONSISTENT WITH PREVIOUSLY REPORTED AND CALLED VALUE. Performed at [facility], [place] [address] [place]  [address]   Renal function panel     Status: Abnormal   Collection Time: 11/15/20  9:02 AM  Result Value Ref Range   Sodium 142 135 - 145 mmol/L    Potassium 5.0 3.5 - 5.1 mmol/L   Chloride 112 (H) 98 - 111 mmol/L   CO2 20 (L) 22 - 32 mmol/L   Glucose, Bld 423 (H) 70 - 99 mg/dL    Comment: Glucose reference range applies only to samples taken after fasting for at least 8 hours.   BUN 61 (H) 8 - 23 mg/dL   Creatinine, Ser 2.60 (H) 0.61 - 1.24 mg/dL   Calcium 8.6 (L) 8.9 - 10.3 mg/dL   Phosphorus 3.7 2.5 - 4.6 mg/dL   Albumin 2.1 (L) 3.5 - 5.0 g/dL   GFR, Estimated 24 (L) >60 mL/min    Comment: (NOTE) Calculated using the CKD-EPI Creatinine Equation (2021)    Anion gap 10 5 - 15    Comment: Performed at [facility] [address] [place], [place] [address]  Troponin I (High Sensitivity)     Status: Abnormal   Collection Time: 11/15/20  9:02 AM  Result Value Ref Range   Troponin I (High Sensitivity) 193 (HH) <18 ng/L    Comment: CRITICAL VALUE NOTED.  VALUE IS CONSISTENT WITH PREVIOUSLY REPORTED AND CALLED VALUE. (NOTE) Elevated high sensitivity troponin I (hsTnI) values and significant  changes across serial measurements may suggest ACS but many other  chronic and acute conditions are known to elevate hsTnI results.  Refer to the Links section for chest pain algorithms and additional  guidance. Performed at [facility], [place] [address] [place], [place] [address]   Protime-INR     Status: Abnormal   Collection Time: 11/15/20  9:02 AM  Result Value Ref Range   Prothrombin Time 20.4 (H) 11.4 - 15.2 seconds   INR 1.8 (H) 0.8 - 1.2    Comment: (NOTE) INR goal varies based on device and disease states. Performed at [facility], [place] [address], [place], [place] [address]   I-STAT 7, (LYTES, BLD GAS, ICA, H+H)     Status: Abnormal   Collection Time: 11/15/20  9:28 AM  Result Value Ref Range   pH, Arterial 7.392 7.350 - 7.450   pCO2 arterial 33.1 32.0 - 48.0 mmHg   pO2, Arterial 73 (L) 83.0 - 108.0 mmHg   Bicarbonate 20.2 20.0 - 28.0 mmol/L   TCO2 21 (L) 22 - 32 mmol/L   O2 Saturation 95.0 %    Acid-base deficit 4.0 (H) 0.0 - 2.0 mmol/L   Sodium 143 135 - 145 mmol/L   Potassium 4.8 3.5 - 5.1 mmol/L   Calcium, Ion 1.30 1.15 - 1.40 mmol/L   HCT 28.0 (L) 39.0 - 52.0 %   Hemoglobin 9.5 (L) 13.0 - 17.0 g/dL   Patient temperature 98.3 F    Sample type ARTERIAL   Glucose, capillary     Status: Abnormal   Collection Time: 11/15/20 11:11 AM  Result Value Ref Range   Glucose-Capillary 349 (H) 70 - 99 mg/dL    Comment: Glucose reference range applies only to samples taken after fasting for at least 8 hours.  Glucose, capillary     Status: Abnormal   Collection Time: 11/15/20 12:54 PM  Result Value Ref Range   Glucose-Capillary 201 (H) 70 - 99 mg/dL    Comment: Glucose reference range applies only to samples taken after fasting for at least 8 hours.  Glucose, capillary     Status: Abnormal   Collection Time: 11/15/20  1:51 PM  Result Value Ref Range   Glucose-Capillary 201 (H) 70 - 99 mg/dL    Comment: Glucose reference range applies only to samples taken after fasting for at least 8 hours.  Glucose, capillary     Status: Abnormal   Collection Time: 11/15/20  2:52 PM  Result Value Ref Range   Glucose-Capillary 175 (H) 70 - 99 mg/dL    Comment: Glucose reference range applies only to samples taken after fasting for at least 8 hours.  Glucose, capillary     Status: Abnormal   Collection Time: 11/15/20  3:56 PM  Result Value Ref Range   Glucose-Capillary 162 (H) 70 - 99 mg/dL    Comment: Glucose reference range applies only to samples taken after fasting for at least 8 hours.  Glucose, capillary     Status: Abnormal   Collection Time: 11/15/20  5:00 PM  Result Value Ref Range   Glucose-Capillary 156 (H) 70 - 99 mg/dL    Comment: Glucose reference range applies only to samples taken after fasting for at least 8 hours.  Glucose, capillary     Status: Abnormal   Collection Time: 11/15/20  7:09 PM  Result Value Ref Range   Glucose-Capillary 149 (H) 70 - 99 mg/dL    Comment: Glucose  reference range applies only to samples taken after fasting for at least 8 hours.  Glucose, capillary     Status: Abnormal   Collection Time: 11/15/20  8:24 PM  Result Value Ref Range   Glucose-Capillary 148 (H) 70 - 99 mg/dL    Comment: Glucose reference range applies only to samples taken after fasting for at least 8 hours.  APTT     Status: Abnormal   Collection Time: 11/15/20  8:27 PM  Result Value Ref Range   aPTT 94 (H) 24 - 36 seconds    Comment:        IF BASELINE aPTT IS ELEVATED, SUGGEST PATIENT RISK ASSESSMENT BE USED TO DETERMINE APPROPRIATE ANTICOAGULANT THERAPY. Performed at [facility], [place] [address], [place], [place] [address]   Heparin level (unfractionated)     Status: Abnormal   Collection Time: 11/15/20  8:27 PM  Result Value Ref Range   Heparin Unfractionated 1.06 (H) 0.30 - 0.70 IU/mL    Comment: (NOTE) The clinical reportable range upper limit is being lowered to >1.10 to align with the FDA approved guidance for the current laboratory assay.  If heparin results are below expected values, and patient dosage has  been confirmed, suggest follow up testing of antithrombin III levels. Performed at [facility], [place] [address], [place], [place] [address]   Glucose, capillary     Status: Abnormal   Collection Time: 11/15/20  9:56 PM  Result Value Ref Range   Glucose-Capillary 131 (H) 70 - 99 mg/dL    Comment: Glucose reference range applies only to samples taken after fasting for at least 8 hours.  Glucose, capillary     Status: Abnormal   Collection Time: 11/15/20 10:59 PM  Result Value Ref Range   Glucose-Capillary 132 (H) 70 - 99 mg/dL    Comment: Glucose reference range applies only to samples taken after fasting for at least 8 hours.  Glucose, capillary     Status: Abnormal   Collection Time: 11/16/20 12:00 AM  Result Value Ref Range   Glucose-Capillary 133 (H) 70 - 99 mg/dL    Comment: Glucose reference range applies only to samples  taken after fasting for at least 8 hours.  Glucose, capillary     Status: Abnormal   Collection Time: 11/16/20  1:05 AM  Result Value Ref Range   Glucose-Capillary 149 (H) 70 - 99 mg/dL    Comment: Glucose reference range applies only to samples taken after fasting for at least 8 hours.  Glucose, capillary     Status: Abnormal   Collection Time: 11/16/20  2:13 AM  Result Value Ref Range   Glucose-Capillary 146 (H) 70 - 99 mg/dL    Comment: Glucose reference range applies only to samples taken after fasting for at least 8 hours.  Magnesium     Status: Abnormal   Collection Time: 11/16/20  2:24 AM  Result Value Ref Range   Magnesium 1.6 (L) 1.7 - 2.4 mg/dL    Comment: Performed at [facility] [address], [place], [place] [address]  Phosphorus     Status: Abnormal   Collection Time: 11/16/20  2:24 AM  Result Value Ref Range   Phosphorus 1.6 (L) 2.5 - 4.6 mg/dL    Comment: Performed  at [facility], [place] [address] [place], [place] [address]  Comprehensive metabolic panel     Status: Abnormal   Collection Time: 11/16/20  2:24 AM  Result Value Ref Range   Sodium 144 135 - 145 mmol/L   Potassium 3.7 3.5 - 5.1 mmol/L   Chloride 114 (H) 98 - 111 mmol/L   CO2 22 22 - 32 mmol/L   Glucose, Bld 194 (H) 70 - 99 mg/dL    Comment: Glucose reference range applies only to samples taken after fasting for at least 8 hours.   BUN 63 (H) 8 - 23 mg/dL   Creatinine, Ser 2.69 (H) 0.61 - 1.24 mg/dL   Calcium 8.6 (L) 8.9 - 10.3 mg/dL   Total Protein 5.5 (L) 6.5 - 8.1 g/dL   Albumin 1.8 (L) 3.5 - 5.0 g/dL   AST 298 (H) 15 - 41 U/L   ALT 414 (H) 0 - 44 U/L   Alkaline Phosphatase 66 38 - 126 U/L   Total Bilirubin 0.9 0.3 - 1.2 mg/dL   GFR, Estimated 23 (L) >60 mL/min    Comment: (NOTE) Calculated using the CKD-EPI Creatinine Equation (2021)    Anion gap 8 5 - 15    Comment: Performed at [facility], [place] [address] [place], [place] [address]  Triglycerides     Status: None    Collection Time: 11/16/20  2:24 AM  Result Value Ref Range   Triglycerides 64 <150 mg/dL    Comment: Performed at [facility] [address], [place], [place] [address]  Ammonia     Status: None   Collection Time: 11/16/20  2:24 AM  Result Value Ref Range   Ammonia 25 9 - 35 umol/L    Comment: Performed at [facility], [place] [address], [place], [place] [address]  Heparin level (unfractionated)     Status: Abnormal   Collection Time: 11/16/20  2:24 AM  Result Value Ref Range   Heparin Unfractionated 0.83 (H) 0.30 - 0.70 IU/mL    Comment: (NOTE) The clinical reportable range upper limit is being lowered to >1.10 to align with the FDA approved guidance for the current laboratory assay.  If heparin results are below expected values, and patient dosage has  been confirmed, suggest follow up testing of antithrombin III levels. Performed at [facility], [place] [address], [place], [place] [address]   APTT     Status: Abnormal   Collection Time: 11/16/20  2:24 AM  Result Value Ref Range   aPTT 132 (H) 24 - 36 seconds    Comment:        IF BASELINE aPTT IS ELEVATED, SUGGEST PATIENT RISK ASSESSMENT BE USED TO DETERMINE APPROPRIATE ANTICOAGULANT THERAPY. SAMPLE CHECK OK 11/16/20 A JOHNSON  Performed at [facility], [place] [address] [place], [place] [address]   Glucose, capillary     Status: Abnormal   Collection Time: 11/16/20  3:10 AM  Result Value Ref Range   Glucose-Capillary 148 (H) 70 - 99 mg/dL    Comment: Glucose reference range applies only to samples taken after fasting for at least 8 hours.  CBC     Status: Abnormal   Collection Time: 11/16/20  3:30 AM  Result Value Ref Range   WBC 18.2 (H) 4.0 - 10.5 K/uL   RBC 3.19 (L) 4.22 - 5.81 MIL/uL   Hemoglobin 9.5 (L) 13.0 - 17.0 g/dL   HCT 28.4 (L) 39.0 - 52.0 %  MCV 89.0 80.0 - 100.0 fL   MCH 29.8 26.0 - 34.0 pg   MCHC 33.5 30.0 - 36.0 g/dL   RDW 14.1 11.5 - 15.5 %   Platelets 222 150 - 400 K/uL    nRBC 0.1 0.0 - 0.2 %    Comment: Performed at [facility], [place] [address], [place], [place] [address]  Glucose, capillary     Status: Abnormal   Collection Time: 11/16/20  4:00 AM  Result Value Ref Range   Glucose-Capillary 143 (H) 70 - 99 mg/dL    Comment: Glucose reference range applies only to samples taken after fasting for at least 8 hours.  I-STAT 7, (LYTES, BLD GAS, ICA, H+H)     Status: Abnormal   Collection Time: 11/16/20  4:38 AM  Result Value Ref Range   pH, Arterial 7.587 (H) 7.350 - 7.450   pCO2 arterial 24.0 (L) 32.0 - 48.0 mmHg   pO2, Arterial 142 (H) 83.0 - 108.0 mmHg   Bicarbonate 22.9 20.0 - 28.0 mmol/L   TCO2 24 22 - 32 mmol/L   O2 Saturation 100.0 %   Acid-Base Excess 2.0 0.0 - 2.0 mmol/L   Sodium 151 (H) 135 - 145 mmol/L   Potassium 3.7 3.5 - 5.1 mmol/L   Calcium, Ion 1.18 1.15 - 1.40 mmol/L   HCT 31.0 (L) 39.0 - 52.0 %   Hemoglobin 10.5 (L) 13.0 - 17.0 g/dL   Patient temperature 36.9 C    Sample type ARTERIAL   Glucose, capillary     Status: Abnormal   Collection Time: 11/16/20  5:11 AM  Result Value Ref Range   Glucose-Capillary 138 (H) 70 - 99 mg/dL    Comment: Glucose reference range applies only to samples taken after fasting for at least 8 hours.  Glucose, capillary     Status: Abnormal   Collection Time: 11/16/20  6:10 AM  Result Value Ref Range   Glucose-Capillary 103 (H) 70 - 99 mg/dL    Comment: Glucose reference range applies only to samples taken after fasting for at least 8 hours.    Recent Results (from the past 240 hour(s))  Resp Panel by RT-PCR (Flu A&B, Covid) Nasopharyngeal Swab     Status: None   Collection Time: 10/21/2020  7:59 PM   Specimen: Nasopharyngeal Swab; Nasopharyngeal(NP) swabs in vial transport medium  Result Value Ref Range Status   SARS Coronavirus 2 by RT PCR NEGATIVE NEGATIVE Final    Comment: (NOTE) SARS-CoV-2 target nucleic acids are NOT DETECTED.  The SARS-CoV-2 RNA is generally detectable in upper  respiratory specimens during the acute phase of infection. The lowest concentration of SARS-CoV-2 viral copies this assay can detect is 138 copies/mL. A negative result does not preclude SARS-Cov-2 infection and should not be used as the sole basis for treatment or other patient management decisions. A negative result may occur with  improper specimen collection/handling, submission of specimen other than nasopharyngeal swab, presence of viral mutation(s) within the areas targeted by this assay, and inadequate number of viral copies(<138 copies/mL). A negative result must be combined with clinical observations, patient history, and epidemiological information. The expected result is Negative.  Fact Sheet for Patients:  EntrepreneurPulse.com.au  Fact Sheet for Healthcare Providers:  IncredibleEmployment.be  This test is no t yet approved or cleared by the [place] FDA and  has been authorized for detection and/or diagnosis of SARS-CoV-2 by FDA under an Emergency Use Authorization (EUA). This EUA will remain  in effect (meaning this  test can be used) for the duration of the COVID-19 declaration under Section 564(b)(1) of the Act, 21 U.S.C.section 360bbb-3(b)(1), unless the authorization is terminated  or revoked sooner.       Influenza A by PCR NEGATIVE NEGATIVE Final   Influenza B by PCR NEGATIVE NEGATIVE Final    Comment: (NOTE) The Xpert Xpress SARS-CoV-2/FLU/RSV plus assay is intended as an aid in the diagnosis of influenza from Nasopharyngeal swab specimens and should not be used as a sole basis for treatment. Nasal washings and aspirates are unacceptable for Xpert Xpress SARS-CoV-2/FLU/RSV testing.  Fact Sheet for Patients: EntrepreneurPulse.com.au  Fact Sheet for Healthcare Providers: IncredibleEmployment.be  This test is not yet approved or cleared by the [place] FDA and has been  authorized for detection and/or diagnosis of SARS-CoV-2 by FDA under an Emergency Use Authorization (EUA). This EUA will remain in effect (meaning this test can be used) for the duration of the COVID-19 declaration under Section 564(b)(1) of the Act, 21 U.S.C. section 360bbb-3(b)(1), unless the authorization is terminated or revoked.  Performed at [facility], [place] [address], [place], [place] [address]   Blood culture (routine x 2)     Status: None   Collection Time: 10/08/2020  8:15 PM   Specimen: BLOOD RIGHT HAND  Result Value Ref Range Status   Specimen Description BLOOD RIGHT HAND  Final   Special Requests   Final    BOTTLES DRAWN AEROBIC AND ANAEROBIC Blood Culture results may not be optimal due to an excessive volume of blood received in culture bottles   Culture   Final    NO GROWTH 5 DAYS Performed at [facility], [place] [address] [place], [place] [address]    Report Status 11/11/2020 FINAL  Final  Blood culture (routine x 2)     Status: None   Collection Time: 10/12/2020  8:32 PM   Specimen: BLOOD LEFT ARM  Result Value Ref Range Status   Specimen Description BLOOD LEFT ARM  Final   Special Requests   Final    BOTTLES DRAWN AEROBIC ONLY Blood Culture adequate volume   Culture   Final    NO GROWTH 5 DAYS Performed at [facility], [place] [address], [place], [place] [address]    Report Status 11/11/2020 FINAL  Final  MRSA Next Gen by PCR, Nasal     Status: None   Collection Time: 11/07/20  3:29 AM   Specimen: Nasal Mucosa; Nasal Swab  Result Value Ref Range Status   MRSA by PCR Next Gen NOT DETECTED NOT DETECTED Final    Comment: (NOTE) The GeneXpert MRSA Assay (FDA approved for NASAL specimens only), is one component of a comprehensive MRSA colonization surveillance program. It is not intended to diagnose MRSA infection nor to guide or monitor treatment for MRSA infections. Test performance is not FDA approved in patients less than 50  years old. Performed at [facility], [place] [address], [place], [place] [address]   MRSA Next Gen by PCR, Nasal     Status: None   Collection Time: 11/15/20  1:30 AM   Specimen: Nasal Mucosa; Nasal Swab  Result Value Ref Range Status   MRSA by PCR Next Gen NOT DETECTED NOT DETECTED Final    Comment: (NOTE) The GeneXpert MRSA Assay (FDA approved for NASAL specimens only), is one component of a comprehensive MRSA colonization surveillance program. It is not intended to diagnose MRSA infection nor to guide or monitor treatment for  MRSA infections. Test performance is not FDA approved in patients less than 26 years old. Performed at [facility], [place] [address] [place], [place] [address]   Culture, blood (routine x 2)     Status: None (Preliminary result)   Collection Time: 11/15/20  5:36 AM   Specimen: BLOOD LEFT HAND  Result Value Ref Range Status   Specimen Description BLOOD LEFT HAND  Final   Special Requests   Final    AEROBIC BOTTLE ONLY Blood Culture results may not be optimal due to an excessive volume of blood received in culture bottles   Culture   Final    NO GROWTH < 12 HOURS Performed at [facility], [place] [address], [place], [place] [address]    Report Status PENDING  Incomplete  Culture, blood (routine x 2)     Status: None (Preliminary result)   Collection Time: 11/15/20  5:36 AM   Specimen: BLOOD RIGHT HAND  Result Value Ref Range Status   Specimen Description BLOOD RIGHT HAND  Final   Special Requests   Final    AEROBIC BOTTLE ONLY Blood Culture results may not be optimal due to an excessive volume of blood received in culture bottles   Culture   Final    NO GROWTH < 12 HOURS Performed at [facility], [place] [address], [place], [place] [address]    Report Status PENDING  Incomplete  Culture, Respiratory w Gram Stain     Status: None (Preliminary result)   Collection Time: 11/15/20  8:51 AM   Specimen: Tracheal Aspirate; Respiratory   Result Value Ref Range Status   Specimen Description TRACHEAL ASPIRATE  Final   Special Requests NONE  Final   Gram Stain   Final    FEW SQUAMOUS EPITHELIAL CELLS PRESENT FEW WBC SEEN RARE GRAM POSITIVE COCCI Performed at [facility], [place] [address] [place], [place] [address]    Culture PENDING  Incomplete   Report Status PENDING  Incomplete    Lipid Panel Recent Labs    11/16/20 0224  TRIG 64    Studies/Results: CT ABDOMEN PELVIS WO CONTRAST  Result Date: 11/15/2020 CLINICAL DATA:  80 year old male with sepsis. EXAM: CT ABDOMEN AND PELVIS WITHOUT CONTRAST TECHNIQUE: Multidetector CT imaging of the abdomen and pelvis was performed following the standard protocol without IV contrast. COMPARISON:  [facility] CT Abdomen and Pelvis 07/09/2008. CTA chest 10/16/2010. FINDINGS: Lower chest: Streak artifact from the patient's arms. But bilateral lower lobe consolidation with air bronchograms is evident. Questionable superimposed pleural fluid, difficult to confirm given the artifact. Cardiac size is stable since 2010. No pericardial effusion. Hepatobiliary: Vicarious contrast excretion versus gallstones and/or sludge within the lumen of the gallbladder (series 3, image 30) which is otherwise partially contracted. No pericholecystic inflammation. Negative noncontrast liver parenchyma. Pancreas: Negative. Spleen: Negative. Adrenals/Urinary Tract: Normal adrenal glands. No hydronephrosis, but there is bilateral fairly symmetric pararenal fluid/stranding. Renal vascular calcifications. Difficult to exclude nephrolithiasis. But both ureters are decompressed. The bladder is thick-walled, but decompressed. There is mild perivesical inflammatory stranding as seen on series 3, image 83 and sagittal image 100. Stomach/Bowel: No dilated large or small bowel. Oral contrast is present in the colon to the rectum. Normal appendix on series 3, image 68. No large bowel wall thickening.  Decompressed and negative terminal ileum. Enteric tube courses through the esophagus and terminates in the gastric body. Small bowel loops are within normal limits. Chronic postoperative changes to the  midline ventral abdominal wall. No free air. No free fluid. Vascular/Lymphatic: Aortoiliac calcified atherosclerosis. Normal caliber abdominal aorta. Left femoral approach arterial and venous catheters. Vascular patency is not evaluated in the absence of IV contrast. No lymphadenopathy. Reproductive: Negative. Other: Mild presacral stranding. No pelvic free fluid. Bilateral flank body wall edema. Musculoskeletal: Minimally displaced anterior rib fractures appear age indeterminate, affecting the bilateral anterior 6th and 7th ribs. No other acute osseous abnormality identified. 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 IMPRESSION: 1. Lung bases partially obscured by streak artifact but bilateral lower lobe consolidation is visible and suspicious for Bilateral Pneumonia. 2. Decompressed urinary bladder with surrounding inflammatory stranding raising the possibility of UTI. Symmetric bilateral pararenal fluid/stranding is nonspecific but given the absence of obstructive uropathy might indicate acute intrinsic renal disease. 3. Age indeterminate bilateral anterior 6th and 7th rib fractures. 4. Vicarious excretion of contrast versus gallstones and/or sludge within the gallbladder lumen. But no CT evidence of acute cholecystitis. 5. No bowel obstruction or inflammation, normal appendix. Enteric tube terminates in the stomach. 6. Aortic Atherosclerosis (ICD10-I70.0). Left femoral approach arterial and venous catheters in place. Electronically Signed   By: Genevie Ann M.D.   On: 11/15/2020 04:02   CT HEAD WO CONTRAST (5MM)  Result Date: 11/15/2020 CLINICAL DATA:  Mental status change, unknown cause EXAM: CT HEAD WITHOUT CONTRAST TECHNIQUE: Contiguous axial images were  obtained from the base of the skull through the vertex without intravenous contrast. COMPARISON:  11/13/2020 FINDINGS: Brain: Stable left middle cranial fossa cyst. There is atrophy and chronic small vessel disease changes. No acute intracranial abnormality. Specifically, no hemorrhage, hydrocephalus, acute infarction, or significant intracranial injury. Vascular: No hyperdense vessel or unexpected calcification. Skull: No acute calvarial abnormality. Sinuses/Orbits: No acute findings Other: None IMPRESSION: Atrophy, chronic microvascular disease. No acute intracranial abnormality. Electronically Signed   By: Rolm Baptise M.D.   On: 11/15/2020 03:49   DG CHEST PORT 1 VIEW  Result Date: 11/15/2020 CLINICAL DATA:  Mental there are dependent. EXAM: PORTABLE CHEST 1 VIEW COMPARISON:  Radiograph yesterday FINDINGS: Endotracheal tube tip is 4.1 cm from the carina. Tip and side port of the enteric tube are below the diaphragm in the stomach. Low lung volumes. Stable cardiomegaly. Unchanged mediastinal contours. Worsening perivascular haziness suspicious for edema. Suspected small pleural effusions. Mild bilateral perihilar atelectasis. No pneumothorax. IMPRESSION: 1. Endotracheal tube tip 4.1 cm from the carina. Enteric tube in place. 2. Cardiomegaly with worsening perivascular haziness suspicious for edema. 3. Suspected small pleural effusions. Electronically Signed   By: Keith Rake M.D.   On: 11/15/2020 01:49   EEG adult  Result Date: 11/15/2020 Lora Havens, MD     11/15/2020 11:11 AM Patient Name: Patrick Shelton MRN: [id number] Epilepsy Attending: Lora Havens Referring Physician/Provider: Hayden Pedro, NP Date: 11/15/2020 Duration: 24.37 mins Patient history: 80 year old male with altered mental status and concern for anoxic brain injury noted to have abnormal movements concerning for myoclonus or seizure.  EEG to evaluate for seizures. Level of alertness: comatose AEDs during EEG study: Propofol  Technical aspects: This EEG study was done with scalp electrodes positioned according to the 10-20 International system of electrode placement. Electrical activity was acquired at a sampling rate of '500Hz'  and reviewed with a high frequency filter of '70Hz'  and a low frequency filter of '1Hz' . EEG data were recorded continuously and digitally stored. Description: EEG showed continuous generalized background suppression.  Every 30 seconds to 1 minute patient was  noted to have brief whole-body jerk with eye opening.  Concomitant EEG showed generalized polyspikes consistent with myoclonic seizure.  Hyperventilation and photic stimulation were not performed.   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  During the seizure patient was noted to have brief full body jerks with eye opening.  Additionally there was profound diffuse encephalopathy.  This EEG is concerning for anoxic/hypoxic brain injury. Jennelle Human, NP was notified. Priyanka [name]    Medications: Scheduled:  acetaminophen  650 mg Oral Q4H   Or   acetaminophen (TYLENOL) oral liquid 160 mg/5 mL  650 mg Per Tube Q4H   Or   acetaminophen  650 mg Rectal Q4H   busPIRone  30 mg Oral Q8H   Or   busPIRone  30 mg Per Tube Q8H   chlorhexidine gluconate (MEDLINE KIT)  15 mL Mouth Rinse BID   Chlorhexidine Gluconate Cloth  6 each Topical Daily   cholecalciferol  5,000 Units Per Tube Daily   docusate  100 mg Per Tube Daily   mouth rinse  15 mL Mouth Rinse 10 times per day   pantoprazole sodium  40 mg Per Tube Daily   polyethylene glycol  17 g Per Tube Daily   Continuous:  sodium chloride Stopped (11/16/20 0622)   dexmedetomidine (PRECEDEX) IV infusion     heparin 1,600 Units/hr (11/16/20 0646)   insulin Stopped (11/16/20 0612)   levETIRAcetam     norepinephrine (LEVOPHED) Adult infusion Stopped (11/16/20 9833)    piperacillin-tazobactam (ZOSYN)  IV 12.5 mL/hr at 11/16/20 0646   propofol (DIPRIVAN) infusion 20 mcg/kg/min (11/16/20 0646)    CT Head without contrast 11/15/2020: Atrophy, chronic microvascular disease. No acute intracranial abnormality.     Assessment: 80 year old male status post cardiac arrest requiring 15 minutes of CPR prior to ROSC and subsequently noted to have myoclonic seizures. -Exam today performed 10 minutes after propofol was held: Depressed brainstem reflexes but will partially open eyes to noxious and will cough to deep stimulation. Nonpurposeful flexion of BLE to noxious stimuli. No movement of upper extremities to stimulation.  -EEG from yesterday: This study showed myoclonic seizures with generalized onset every 30 seconds to 1 minute.  During the seizure patient was noted to have brief full body jerks with eye opening.  Additionally there was profound diffuse encephalopathy.  This EEG is concerning for anoxic/hypoxic brain injury. - LTM EEG report from today AM: This study initially showed myoclonic seizures with generalized onset every 30 seconds to 1 minute.  During the seizure patient was noted to have brief full body jerks with eye opening. As sedation was adjusted, myoclonic seizures abated, last seizure on 11/15/2020 at 1121.  Additionally there was severe to profound diffuse encephalopathy. This EEG is concerning for anoxic/hypoxic brain injury. However, EEG has started to show some normal brain activity as well. I would suggest leaving on propofol today and maybe wean tomorrow.  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 May  consider addition of phenobarbital if myoclonic seizures recur -Propofol gtt currently at 30 mcg/kg/hr. Propofol can be increased further for clinical seizure suppression as needed -Continue LTM EEG while we are adjusting  medications -Prognosis: Myoclonic seizures within the first 24 hours are usually suggestive of significant anoxic brain injury.  However, he has cranial nerve reflexes and withdraws to noxious stimuli in both upper extremities.  Therefore we will attempt to treat seizures and obtain MRI brain without contrast at 72 hours to look for the extent of anoxic/hypoxic brain injury.  Wife states patient would not want tracheostomy and feeding tube.   -As needed IV Versed 4 mg for clinical seizure-like activity -Continue seizure precautions -Obtain MRI after 72 hours. -Management of rest of comorbidities per primary team   35 minutes spent in the neurological evaluation and management of this critically ill patient.    LOS: 9 days   '@Electronically'  signed: Dr. Kerney Elbe 11/16/2020  7:20 AM

## 2020-11-16 NOTE — Progress Notes (Signed)
NAME:  Patrick Shelton, MRN:  [id number], DOB:  01/17/1941, LOS: 9 ADMISSION DATE:  10/13/2020, CONSULTATION DATE:  11/15/2020 REFERRING MD:  Dr. Tonie Griffith, CHIEF COMPLAINT:  Cardiac Arrest    History of Present Illness:  80 year old male presents to ED on 0000000 with complicated gangreof Left 4th toe. Vascular consulted. 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 Stay complicated by acute on chronic renal failure, hyperkalemia, encephalopathy, and respiratory distress.   9/8 patient with hypoxia and labored breathing with productive cough with white clear frothy sputum. Given lasix. 9/9 patient found unresponsive and pulseless. PEA. Required 15 minutes of CPR, 1 Calcium, 4 EPI, and 1 sodium Bicarb prior to ROSC. On arrival to ICU patient is hypotensive, bradycardiac, intubated, and unresponsive.   Pertinent  Medical History  DM, HTN, HLD, PAD, A.Fib on Eliquis, s/p Right BKA, now Left BKA   Significant Hospital Events: Including procedures, antibiotic start and stop dates in addition to other pertinent events   9/1 Presents to ED with suspected osteo of 4th toe to LLE  9/6 L BKA 9/9 early am cardiac arrest, prolonged, intubated, on pressors  Interim History / Subjective:  Remains intubated, sedated on propofol. Not on norepinephrine this morning.   Objective   Blood pressure (!) 151/63, pulse 64, temperature 98.1 F (36.7 C), resp. rate (!) 24, height '5\' 10"'$  (1.778 m), weight 121.3 kg, SpO2 99 %.    Vent Mode: PRVC FiO2 (%):  [40 %-60 %] 40 % Set Rate:  [24 bmp] 24 bmp Vt Set:  [580 mL] 580 mL PEEP:  [5 cmH20] 5 cmH20 Plateau Pressure:  [21 cmH20-26 cmH20] 23 cmH20   Intake/Output Summary (Last 24 hours) at 11/16/2020 0730 Last data filed at 11/16/2020 0646 Gross per 24 hour  Intake 1232.13 ml  Output 1081 ml  Net 151.13 ml    Filed Weights   11/05/2020 1954 11/10/20 1900 11/15/20 1845  Weight: 117.9 kg 117.9 kg 121.3 kg    Examination: General:  critically ill appearing man lying in bed  intubated, sedated on cEEG HEENT: [place]/AT, eyes anicteric Neuro: examined on propofol infusion. No observed myoclonus.  Previous above the vent when the rate is reduced below 10.  Some eye-opening with stimulation.  Small but reactive pupils, negative corneal and doll's eye reflexes.  Intact pharyngeal gag and tracheal cough reflexes.  Withdraws from painful stimulation in the left lower extremity, but not arms or right lower extremity. CV: Regular rate, irregular rhythm, no murmurs PULM: Thick secretions, small volume.  Breathing synchronously with the ventilator, not breathing above the vent had a higher rate. 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  Continue Keppra and propofol.  Difficult neuro prognosticate with sedation, but can hold propofol when myoclonus is well controlled per neurology. - cEEG per neurology. - Neuroprotective measures, avoid fevers. -Seizure precautions  PEA cardiac arrest, unclear etiology, likely respiratory related given above recent events and increasing resp distress 9/8, less likely PE given patient has been on heparin gtt then transitioned to eliquis.  Has had evidence of elevated BNP and signs of edema on CXR and failed his SLP on 9/8, with some concern for esophageal issues/ obstruction, but OGT was  placed successfully.      -Continue supportive care - Repeat ABG to ensure that alkalosis has resolved - Continue TTM normothermia and supportive measures  Shock, suspect related to sedating medications.  -echo pending -norepi PRN to maintain MAP >65  A.Fib > Bradycardic episodes   H/O HTN - previous TTE in  05/2020 with normal EF, G1DD Plan - con't to hold lopressor 25 mg bid, norvasc '5mg'$  -heparin rather than eliquis given potential need for procedures in ICU - tele monitoring - repeat BMET now, goal K > 4, Mag > 2  Acute Hypoxic Respiratory Failure requiring MV Bilateral lower lobe pneumonia, likely due to aspiration  Likely component of pulmonary edema previously -Continue low tidal volume ventilation, 4 to 8 cc/kg ideal body weight with goal plateau less than 30 and driving pressure less than 15. -Sedation per TTM protocol -VAP prevention protocol - Mental status currently precludes extubation.  Not weaning sedation until discussed with neurology. -Wean FiO2 as able to maintain SPO2 greater than 90%. -Continue Zosyn -Follow trach aspirate cultures - SLP eval 9/8 questioned some esophogeal obstruction & recommended esophagram.  Further workup pending neuro recovery.   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 -S/P Rocephin/Flagyl/Vancomycin for 7 days completed 9/7 Plan -Continue to follow blood cultures from 9/9 until finalized - Appreciate vascular surgery's management  Anion Gap Metabolic Acidosis, due to lactic acidosis> resolved Acute on Chronic Renal Failure 2/2 to hypoperfusion associated with cardiac arrest Oliguria improved-1.2 L of urine output in the past 24 hours Hyperkalemia resolved Mild hyperchloremia Plan -Continue to monitor renal function - Renally dose meds and avoid nephrotoxic meds - Strict I's/O - Continue Foley catheter - Maintain adequate renal perfusion  Elevated LFT, suspect secondary to hypoperfusion/ shock-improving Hyperammonemia, resolved -No additional monitoring required  Hyperglycemia  DM; A1c 7.2 -Can transition off insulin infusion to basal bolus - Goal BG 140-180  Hypophosphatemia Hypomagnesemia - Repleted - Continue to monitor daily  Chronic normocytic anemia Transfuse for hemoglobin less than 7 or hemodynamically  significant bleeding  Date wife at bedside today  Best Practice (right click and "Reselect all SmartList Selections" daily)   Diet/type: tubefeeds DVT prophylaxis: systemic heparin GI prophylaxis: PPI Lines: Central line Foley:  Yes, and it is still needed Code Status:  DNR Last date of multidisciplinary goals of care discussion: 9/9 with wife  Labs   CBC: Recent Labs  Lab 11/13/20 0408 11/14/20 0108 11/15/20 0102 11/15/20 0135 11/15/20 0206 11/15/20 0207 11/15/20 0928 11/16/20 0330 11/16/20 0438  WBC 24.7* 25.3* 26.0* 23.3*  --   --   --  18.2*  --   NEUTROABS  --   --  17.1*  --   --   --   --   --   --   HGB 10.6* 11.1* 11.3* 10.9* 11.2* 10.9* 9.5* 9.5* 10.5*  HCT 33.6* 36.0* 36.9* 35.6* 33.0* 32.0* 28.0* 28.4* 31.0*  MCV 94.6 97.3 99.5 98.3  --   --   --  89.0  --   PLT 345 248 267 250  --   --   --  222  --      Basic Metabolic Panel: Recent Labs  Lab 11/14/20 1523 11/15/20 0102 11/15/20 0135 11/15/20 0206 11/15/20 0207 11/15/20 0902 11/15/20 0928 11/16/20 0224 11/16/20 0438  NA 140 143 143   < > 145 142 143 144 151*  K 5.0 5.4* 5.2*   < > 5.0 5.0 4.8 3.7 3.7  CL 107 109 108  --  113* 112*  --  114*  --   CO2 26 20* 19*  --   --  20*  --  22  --   GLUCOSE 233* 321* 333*  --  358* 423*  --  194*  --   BUN 48* 52* 52*  --  50* 61*  --  63*  --   CREATININE 2.11* 2.15* 2.22*  --  2.00* 2.60*  --  2.69*  --   CALCIUM 8.5* 9.3 9.2  --   --  8.6*  --  8.6*  --   MG  --  2.0  --   --   --   --   --  1.6*  --   PHOS  --   --   --   --   --  3.7  --  1.6*  --    < > = values in this interval not displayed.    GFR: Estimated Creatinine Clearance: 28.6 mL/min (A) (by C-G formula based on SCr of 2.69 mg/dL (H)). Recent Labs  Lab 11/14/20 0108 11/15/20 0102 11/15/20 0135 11/15/20 0536 11/15/20 0859 11/16/20 0330  WBC 25.3* 26.0* 23.3*  --   --  18.2*  LATICACIDVEN  --  6.1*  --  4.5* 3.4*  --      Liver Function Tests: Recent Labs  Lab  11/15/20 0102 11/15/20 0902 11/16/20 0224  AST 918*  --  298*  ALT 452*  --  414*  ALKPHOS 95  --  66  BILITOT 0.6  --  0.9  PROT 6.5  --  5.5*  ALBUMIN 2.2* 2.1* 1.8*    No results for input(s): LIPASE, AMYLASE in the last 168 hours. Recent Labs  Lab 11/15/20 0131 11/16/20 0224  AMMONIA 68* 25     ABG    Component Value Date/Time   PHART 7.587 (H) 11/16/2020 0438   PCO2ART 24.0 (L) 11/16/2020 0438   PO2ART 142 (H) 11/16/2020 0438   HCO3 22.9 11/16/2020 0438   TCO2 24 11/16/2020 0438   ACIDBASEDEF 4.0 (H) 11/15/2020 0928   O2SAT 100.0 11/16/2020 0438       This patient is critically ill with multiple organ system failure which requires frequent high complexity decision making, assessment, support, evaluation, and titration of therapies. This was completed through the application of advanced monitoring technologies and extensive interpretation of multiple databases. During this encounter critical care time was devoted to patient care services described in this note for 36 minutes.   Julian Hy, DO 11/16/20 9:07 AM [facility]

## 2020-11-16 NOTE — Progress Notes (Signed)
Maint. Complete. No skin breakdown.

## 2020-11-16 NOTE — Progress Notes (Signed)
ANTICOAGULATION CONSULT NOTE  Pharmacy Consult for heparin Indication: atrial fibrillation  Allergies  Allergen Reactions   Prednisone Anaphylaxis and Shortness Of Breath   Dulaglutide Nausea Only   Atorvastatin    Lovastatin     Other reaction(s): OTHER REACTION    Patient Measurements: Height: '5\' 10"'$  (177.8 cm) Weight: 121.3 kg (267 lb 6.7 oz) IBW/kg (Calculated) : 73 Heparin Dosing Weight: 99kg  Vital Signs: Temp: 97.9 F (36.6 C) (09/10 0000) BP: 147/54 (09/10 0015) Pulse Rate: 57 (09/10 0015)  Labs: Recent Labs    11/14/20 0108 11/14/20 1523 11/15/20 0102 11/15/20 0131 11/15/20 0135 11/15/20 0206 11/15/20 0207 11/15/20 0536 11/15/20 0902 11/15/20 0928 11/15/20 2027 11/16/20 0224  HGB 11.1*  --  11.3*  --  10.9* 11.2* 10.9*  --   --  9.5*  --   --   HCT 36.0*  --  36.9*  --  35.6* 33.0* 32.0*  --   --  28.0*  --   --   PLT 248  --  267  --  250  --   --   --   --   --   --   --   APTT  --   --   --   --   --   --   --   --   --   --  94* 132*  LABPROT  --   --   --   --   --   --   --   --  20.4*  --   --   --   INR  --   --   --   --   --   --   --   --  1.8*  --   --   --   HEPARINUNFRC 0.84*  --   --   --   --   --   --   --   --   --  1.06* 0.83*  CREATININE 2.27*   < > 2.15*  --  2.22*  --  2.00*  --  2.60*  --   --  2.69*  TROPONINIHS  --   --   --  38*  --   --   --  144* 193*  --   --   --    < > = values in this interval not displayed.     Estimated Creatinine Clearance: 28.6 mL/min (A) (by C-G formula based on SCr of 2.69 mg/dL (H)).   Assessment: 55 yoM on apixaban PTA for hx AF admitted with gangrenous toe s/p L BKA. Apixaban resumed 9/8, then pt suffered PEA arrest. Pharmacy asked to transition back to IV Heparin. CT Head negative for bleeding. CBC stable this morning, last dose 9/8 ~2100. Pt previously required ~2200 units/h but will begin infusion lower given acute illness and organ failure. Will likely need to dose via APTTs for a few days  while apixaban clears.  Heparin level after transition from Eliquis to IV Heparin is high as expected at 0.8. APTT is now above goal at 132s on heparin at 1800 units/hr.   Goal of Therapy:  Heparin level 0.3-0.7 units/ml aPTT 66-102 seconds Monitor platelets by anticoagulation protocol: Yes   Plan:  Reduce Heparin to 1600 units/h  Recheck Heparin level and aptt in 8 hours Follow-up daily Heparin level and aPTT  Erin Hearing PharmD., BCPS Clinical Pharmacist 11/16/2020 3:32 AM

## 2020-11-16 NOTE — Procedures (Addendum)
Patient Name: Patrick Shelton  MRN: [id number]  Epilepsy Attending: Lora Havens  Referring Physician/Provider: Hayden Pedro, NP Duration: 11/15/2020 [id number] to 11/16/2020 0959   Patient history: 80 year old male with altered mental status and concern for anoxic brain injury noted to have abnormal movements concerning for myoclonus or seizure.  EEG to evaluate for seizures.   Level of alertness: comatose   AEDs during EEG study: Propofol, LEV   Technical aspects: This EEG study was done with scalp electrodes positioned according to the 10-20 International system of electrode placement. Electrical activity was acquired at a sampling rate of '500Hz'$  and reviewed with a high frequency filter of '70Hz'$  and a low frequency filter of '1Hz'$ . EEG data were recorded continuously and digitally stored.    Description:   At the beginning of the study, every 30 seconds to 1 minute patient was noted to have brief whole-body jerk with eye opening.  Concomitant EEG showed generalized polyspikes consistent with myoclonic seizure. As propofol was adjusted, last seizure was noted on 11/15/2020 at 1121     EEG initially showed continuous generalized background suppression. After around 1800on 11/15/2020, eeg showed bursts of low amplitude 6-'9hz'$  theta-alpha activity lasting 3-5 seconds alternating with generalized eeg suppression lasting 2-8 seconds.    ABNORMALITY -Myoclonic seizure, generalized -Burst suppression, generalized   IMPRESSION: This study initially showed myoclonic seizures with generalized onset every 30 seconds to 1 minute.  During the seizure patient was noted to have brief full body jerks with eye opening. As sedation was adjusted, myoclonic seizures abated, last seizure on 11/15/2020 at 1121.  Additionally there was severe to profound diffuse encephalopathy. This EEG is concerning for anoxic/hypoxic brain injury.  EEG appears to be improving compared to previous day.  Lerin Jech [name]

## 2020-11-16 NOTE — Progress Notes (Signed)
ANTICOAGULATION CONSULT NOTE  Pharmacy Consult for heparin Indication: atrial fibrillation  Allergies  Allergen Reactions   Prednisone Anaphylaxis and Shortness Of Breath   Dulaglutide Nausea Only   Atorvastatin    Lovastatin     Other reaction(s): OTHER REACTION    Patient Measurements: Height: '5\' 10"'$  (177.8 cm) Weight: 121.3 kg (267 lb 6.7 oz) IBW/kg (Calculated) : 73 Heparin Dosing Weight: 99kg  Vital Signs: Temp: 98.1 F (36.7 C) (09/10 1800) Temp Source: Bladder (09/10 1600) BP: 131/56 (09/10 1800) Pulse Rate: 63 (09/10 1800)  Labs: Recent Labs    11/15/20 0102 11/15/20 0131 11/15/20 0135 11/15/20 0206 11/15/20 0207 11/15/20 0536 11/15/20 0902 11/15/20 0928 11/15/20 2027 11/16/20 0224 11/16/20 0330 11/16/20 0438 11/16/20 0944 11/16/20 1420 11/16/20 1742  HGB 11.3*  --  10.9*   < > 10.9*  --   --    < >  --   --  9.5* 10.5* 17.0  --   --   HCT 36.9*  --  35.6*   < > 32.0*  --   --    < >  --   --  28.4* 31.0* 50.0  --   --   PLT 267  --  250  --   --   --   --   --   --   --  222  --   --   --   --   APTT  --   --   --   --   --   --   --    < > 94* 132*  --   --   --  153* 120*  LABPROT  --   --   --   --   --   --  20.4*  --   --   --   --   --   --   --   --   INR  --   --   --   --   --   --  1.8*  --   --   --   --   --   --   --   --   HEPARINUNFRC  --   --   --   --   --   --   --   --  1.06* 0.83*  --   --   --  0.75*  --   CREATININE 2.15*  --  2.22*  --  2.00*  --  2.60*  --   --  2.69*  --   --   --   --   --   TROPONINIHS  --  38*  --   --   --  144* 193*  --   --   --   --   --   --   --   --    < > = values in this interval not displayed.     Estimated Creatinine Clearance: 28.6 mL/min (A) (by C-G formula based on SCr of 2.69 mg/dL (H)).   Assessment: 45 yoM on apixaban PTA for hx AF admitted with gangrenous toe s/p L BKA. Apixaban resumed 9/8, then pt suffered PEA arrest. Pharmacy asked to transition back to IV Heparin. CT Head negative  for bleeding. CBC stable this morning, last dose 9/8 ~2100. Pt previously required ~2200 units/h but will begin infusion lower given acute illness and organ failure. Will likely need to dose via APTTs for a few days  while apixaban clears.  Heparin level after transition from Eliquis to IV Heparin is high as expected at 0.8.  APTT this evening drawn from aline = 153.  Repeated with peripheral stick to verify, still elevated at 120.  No overt bleeding or complications noted.  Goal of Therapy:  Heparin level 0.3-0.7 units/ml aPTT 66-102 seconds Monitor platelets by anticoagulation protocol: Yes   Plan:  Reduce Heparin to 1450 units/h  Recheck Heparin level and aptt in 8 hours Follow-up daily Heparin level and aPTT  [place] Crane, Roylene Reason, [facility] Clinical Pharmacist  11/16/2020 7:35 PM   [facility] pharmacy phone numbers are listed on [email or web address]

## 2020-11-16 NOTE — Progress Notes (Signed)
eLink Physician-Brief Progress Note Patient Name: Patrick Shelton DOB: 10/05/1940 MRN: [id number]   Date of Service  11/16/2020  HPI/Events of Note  Resp alkalosis on abg  eICU Interventions  Vent rate decreased from 24 to 18     Intervention Category Major Interventions: Acid-Base disturbance - evaluation and management  Mauri Brooklyn, P 11/16/2020, 5:38 AM

## 2020-11-17 ENCOUNTER — Inpatient Hospital Stay (HOSPITAL_COMMUNITY): Payer: Medicare Other

## 2020-11-17 DIAGNOSIS — G931 Anoxic brain damage, not elsewhere classified: Secondary | ICD-10-CM | POA: Diagnosis not present

## 2020-11-17 DIAGNOSIS — J9601 Acute respiratory failure with hypoxia: Secondary | ICD-10-CM | POA: Diagnosis not present

## 2020-11-17 DIAGNOSIS — N179 Acute kidney failure, unspecified: Secondary | ICD-10-CM | POA: Diagnosis not present

## 2020-11-17 DIAGNOSIS — I469 Cardiac arrest, cause unspecified: Secondary | ICD-10-CM | POA: Diagnosis not present

## 2020-11-17 DIAGNOSIS — J690 Pneumonitis due to inhalation of food and vomit: Secondary | ICD-10-CM

## 2020-11-17 DIAGNOSIS — Z9911 Dependence on respirator [ventilator] status: Secondary | ICD-10-CM | POA: Diagnosis not present

## 2020-11-17 LAB — HEPARIN LEVEL (UNFRACTIONATED)
Heparin Unfractionated: 0.48 IU/mL (ref 0.30–0.70)
Heparin Unfractionated: 0.36 IU/mL (ref 0.30–0.70)

## 2020-11-17 LAB — CBC
HCT: 32.1 % — ABNORMAL LOW (ref 39.0–52.0)
Hemoglobin: 10.4 g/dL — ABNORMAL LOW (ref 13.0–17.0)
MCH: 30.1 pg (ref 26.0–34.0)
MCHC: 32.4 g/dL (ref 30.0–36.0)
MCV: 93 fL (ref 80.0–100.0)
Platelets: 212 10*3/uL (ref 150–400)
RBC: 3.45 MIL/uL — ABNORMAL LOW (ref 4.22–5.81)
RDW: 15 % (ref 11.5–15.5)
WBC: 18.6 10*3/uL — ABNORMAL HIGH (ref 4.0–10.5)
nRBC: 0.3 % — ABNORMAL HIGH (ref 0.0–0.2)

## 2020-11-17 LAB — GLUCOSE, CAPILLARY
Glucose-Capillary: 358 mg/dL — ABNORMAL HIGH (ref 70–99)
Glucose-Capillary: 238 mg/dL — ABNORMAL HIGH (ref 70–99)
Glucose-Capillary: 323 mg/dL — ABNORMAL HIGH (ref 70–99)
Glucose-Capillary: 402 mg/dL — ABNORMAL HIGH (ref 70–99)
Glucose-Capillary: 412 mg/dL — ABNORMAL HIGH (ref 70–99)
Glucose-Capillary: 235 mg/dL — ABNORMAL HIGH (ref 70–99)
Glucose-Capillary: 187 mg/dL — ABNORMAL HIGH (ref 70–99)
Glucose-Capillary: 381 mg/dL — ABNORMAL HIGH (ref 70–99)
Glucose-Capillary: 148 mg/dL — ABNORMAL HIGH (ref 70–99)
Glucose-Capillary: 319 mg/dL — ABNORMAL HIGH (ref 70–99)

## 2020-11-17 LAB — BASIC METABOLIC PANEL
Anion gap: 8 (ref 5–15)
BUN: 68 mg/dL — ABNORMAL HIGH (ref 8–23)
CO2: 23 mmol/L (ref 22–32)
Calcium: 8.1 mg/dL — ABNORMAL LOW (ref 8.9–10.3)
Chloride: 112 mmol/L — ABNORMAL HIGH (ref 98–111)
Creatinine, Ser: 2.8 mg/dL — ABNORMAL HIGH (ref 0.61–1.24)
GFR, Estimated: 22 mL/min — ABNORMAL LOW (ref >60)
Glucose, Bld: 460 mg/dL — ABNORMAL HIGH (ref 70–99)
Potassium: 4.5 mmol/L (ref 3.5–5.1)
Sodium: 143 mmol/L (ref 135–145)

## 2020-11-17 LAB — CULTURE, RESPIRATORY W GRAM STAIN: Culture: NORMAL

## 2020-11-17 LAB — MAGNESIUM: Magnesium: 2 mg/dL (ref 1.7–2.4)

## 2020-11-17 LAB — APTT: aPTT: 83 seconds — ABNORMAL HIGH (ref 24–36)

## 2020-11-17 LAB — PHOSPHORUS: Phosphorus: 4.1 mg/dL (ref 2.5–4.6)

## 2020-11-17 MED ORDER — INSULIN REGULAR(HUMAN) IN NACL 100-0.9 UT/100ML-% IV SOLN
INTRAVENOUS | Status: DC
  Administered 2020-11-17: 11.5 [IU]/h via INTRAVENOUS
  Administered 2020-11-18: 10.5 [IU]/h via INTRAVENOUS
  Administered 2020-11-18: 4.8 [IU]/h via INTRAVENOUS
  Administered 2020-11-19: 5 [IU]/h via INTRAVENOUS
  Administered 2020-11-20: 11.5 [IU]/h via INTRAVENOUS
  Administered 2020-11-20: 4 [IU]/h via INTRAVENOUS
  Filled 2020-11-17 (×7): qty 100

## 2020-11-17 MED ORDER — DEXTROSE 50 % IV SOLN: 0.0000 mL | INTRAVENOUS | Status: DC | PRN

## 2020-11-17 MED ORDER — LABETALOL HCL 5 MG/ML IV SOLN
10.0000 mg | INTRAVENOUS | Status: DC | PRN
  Administered 2020-11-17 – 2020-11-18 (×9): 10 mg via INTRAVENOUS
  Filled 2020-11-17 (×9): qty 4

## 2020-11-17 MED ORDER — INSULIN ASPART 100 UNIT/ML IJ SOLN: 3.0000 [IU] | INTRAMUSCULAR | Status: DC

## 2020-11-17 MED ORDER — HYDRALAZINE HCL 20 MG/ML IJ SOLN
10.0000 mg | INTRAMUSCULAR | Status: DC | PRN
  Administered 2020-11-17 – 2020-11-22 (×6): 10 mg via INTRAVENOUS
  Filled 2020-11-17 (×7): qty 1

## 2020-11-17 MED ORDER — INSULIN DETEMIR 100 UNIT/ML ~~LOC~~ SOLN
20.0000 [IU] | Freq: Two times a day (BID) | SUBCUTANEOUS | Status: DC
  Administered 2020-11-17: 20 [IU] via SUBCUTANEOUS
  Filled 2020-11-17 (×2): qty 0.2

## 2020-11-17 MED ORDER — INSULIN ASPART 100 UNIT/ML IJ SOLN: 6.0000 [IU] | INTRAMUSCULAR | Status: DC

## 2020-11-17 NOTE — Progress Notes (Signed)
ANTICOAGULATION CONSULT NOTE  Pharmacy Consult for heparin Indication: atrial fibrillation  Allergies  Allergen Reactions   Prednisone Anaphylaxis and Shortness Of Breath   Dulaglutide Nausea Only   Atorvastatin    Lovastatin     Other reaction(s): OTHER REACTION    Patient Measurements: Height: '5\' 10"'$  (177.8 cm) Weight: 121.7 kg (268 lb 4.8 oz) IBW/kg (Calculated) : 73 Heparin Dosing Weight: 99kg  Vital Signs: Temp: 98.6 F (37 C) (09/11 0745) Temp Source: Bladder (09/11 0500) BP: 161/67 (09/11 0600) Pulse Rate: 83 (09/11 0745)  Labs: Recent Labs    11/15/20 0131 11/15/20 0135 11/15/20 0206 11/15/20 0207 11/15/20 0536 11/15/20 0902 11/15/20 [id number] 11/16/20 0224 11/16/20 0330 11/16/20 0438 11/16/20 0944 11/16/20 1420 11/16/20 1742 11/17/20 0402 11/17/20 1005  HGB  --  10.9*   < > 10.9*  --   --    < >  --  9.5* 10.5* 17.0  --   --   --  10.4*  HCT  --  35.6*   < > 32.0*  --   --    < >  --  28.4* 31.0* 50.0  --   --   --  32.1*  PLT  --  250  --   --   --   --   --   --  222  --   --   --   --   --  212  APTT  --   --   --   --   --   --    < > 132*  --   --   --  153* 120* 83*  --   LABPROT  --   --   --   --   --  20.4*  --   --   --   --   --   --   --   --   --   INR  --   --   --   --   --  1.8*  --   --   --   --   --   --   --   --   --   HEPARINUNFRC  --   --   --   --   --   --    < > 0.83*  --   --   --  0.75*  --  0.48 0.36  CREATININE  --  2.22*  --  2.00*  --  2.60*  --  2.69*  --   --   --   --   --   --   --   TROPONINIHS 38*  --   --   --  144* 193*  --   --   --   --   --   --   --   --   --    < > = values in this interval not displayed.     Estimated Creatinine Clearance: 28.7 mL/min (A) (by C-G formula based on SCr of 2.69 mg/dL (H)).   Assessment: 19 yoM on apixaban PTA for hx AF admitted with gangrenous toe s/p L BKA. Apixaban resumed 9/8, then pt suffered PEA arrest and now on hold. Pharmacy dosing heparin  -heparin level at goal on  1450 units/hr  Goal of Therapy:  Heparin level 0.3-0.7 units/ml aPTT 66-102 seconds Monitor platelets by anticoagulation protocol: Yes   Plan:  Continue Heparin at 1450 units/h  Daily heparin level and  CBC  Hildred Laser, PharmD Clinical Pharmacist **Pharmacist phone directory can now be found on [email or web address] ([email or web address]).  Listed under [place].

## 2020-11-17 NOTE — Progress Notes (Signed)
ANTICOAGULATION CONSULT NOTE  Pharmacy Consult for heparin Indication: atrial fibrillation  Allergies  Allergen Reactions   Prednisone Anaphylaxis and Shortness Of Breath   Dulaglutide Nausea Only   Atorvastatin    Lovastatin     Other reaction(s): OTHER REACTION    Patient Measurements: Height: '5\' 10"'$  (177.8 cm) Weight: 121.3 kg (267 lb 6.7 oz) IBW/kg (Calculated) : 73 Heparin Dosing Weight: 99kg  Vital Signs: Temp: 99 F (37.2 C) (09/11 0400) Temp Source: Bladder (09/11 0400) BP: 152/64 (09/11 0400) Pulse Rate: 72 (09/11 0400)  Labs: Recent Labs    11/15/20 0102 11/15/20 0131 11/15/20 0135 11/15/20 0206 11/15/20 0207 11/15/20 0536 11/15/20 0902 11/15/20 0928 11/16/20 0224 11/16/20 0330 11/16/20 0438 11/16/20 0944 11/16/20 1420 11/16/20 1742 11/17/20 0402  HGB 11.3*  --  10.9*   < > 10.9*  --   --    < >  --  9.5* 10.5* 17.0  --   --   --   HCT 36.9*  --  35.6*   < > 32.0*  --   --    < >  --  28.4* 31.0* 50.0  --   --   --   PLT 267  --  250  --   --   --   --   --   --  222  --   --   --   --   --   APTT  --   --   --   --   --   --   --    < > 132*  --   --   --  153* 120* 83*  LABPROT  --   --   --   --   --   --  20.4*  --   --   --   --   --   --   --   --   INR  --   --   --   --   --   --  1.8*  --   --   --   --   --   --   --   --   HEPARINUNFRC  --   --   --   --   --   --   --    < > 0.83*  --   --   --  0.75*  --  0.48  CREATININE 2.15*  --  2.22*  --  2.00*  --  2.60*  --  2.69*  --   --   --   --   --   --   TROPONINIHS  --  38*  --   --   --  144* 193*  --   --   --   --   --   --   --   --    < > = values in this interval not displayed.     Estimated Creatinine Clearance: 28.6 mL/min (A) (by C-G formula based on SCr of 2.69 mg/dL (H)).   Assessment: 79 yoM on apixaban PTA for hx AF admitted with gangrenous toe s/p L BKA. Apixaban resumed 9/8, then pt suffered PEA arrest. Pharmacy asked to transition back to IV Heparin. CT Head negative  for bleeding. CBC stable this morning, last dose 9/8 ~2100. Pt previously required ~2200 units/h but will begin infusion lower given acute illness and organ failure. Will likely need to dose via APTTs for a few  days while apixaban clears.  Heparin level after transition from Eliquis to IV   APTT 9/10 drawn from aline = 153s.  Repeated with peripheral stick to verify, still elevated at 120s.  Rate adjusted. Heparin level now down within range at 0.48 and aptt also within range at 83s. They appear to correlate, will stop aptt checks.   Goal of Therapy:  Heparin level 0.3-0.7 units/ml aPTT 66-102 seconds Monitor platelets by anticoagulation protocol: Yes   Plan:  Continue Heparin at 1450 units/h  Recheck Heparin level in 6 hours to confirm Follow-up daily Heparin level and aPTT  Erin Hearing PharmD., BCPS Clinical Pharmacist 11/17/2020 4:54 AM

## 2020-11-17 NOTE — Progress Notes (Signed)
LTM EEG discontinued - no skin breakdown at unhook.   

## 2020-11-17 NOTE — Procedures (Addendum)
Patient Name: Patrick Shelton  MRN: [id number]  Epilepsy Attending: Lora Havens  Referring Physician/Provider: Hayden Pedro, NP Duration: 11/16/2020 [id number] to 11/17/2020 0959   Patient history: 80 year old male with altered mental status and concern for anoxic brain injury noted to have abnormal movements concerning for myoclonus or seizure.  EEG to evaluate for seizures.   Level of alertness: comatose   AEDs during EEG study: Propofol, LEV   Technical aspects: This EEG study was done with scalp electrodes positioned according to the 10-20 International system of electrode placement. Electrical activity was acquired at a sampling rate of '500Hz'$  and reviewed with a high frequency filter of '70Hz'$  and a low frequency filter of '1Hz'$ . EEG data were recorded continuously and digitally stored.    Description: EEG initially showed burst suppression pattern with bursts of 6-'9hz'$  theta-alpha activity lasting 3-5 seconds alternating with generalized eeg suppression lasting 2-8 seconds. Gradually the duration of eeg suppression became shorter and around 2100 on 11/16/2020, eeg started appearing near continuous with low amplitude 6-'9Hz'$  theta-alpha activity admixed with brief 1-2 seconds of generalized attenuation and eventually continuous generalized low amplitude 3-'5Hz'$  theta-delta slowing.    ABNORMALITY - Burst suppression, generalized - Continuous slow, generalized   IMPRESSION: This study was initially profound diffuse encephalopathy. Gradually EEG improved and around 2100 on 11/17/2020, EEG was suggestive of severe diffuse encephalopathy. This eeg pattern is non specific in etiology but could be secondary to sedation, anoxic-hypoxic brain injury in setting of cardiac arrest.    Nia Nathaniel [name]

## 2020-11-17 NOTE — Progress Notes (Signed)
Subjective: No significant events overnight.   Objective: Current vital signs: BP (!) 161/67   Pulse 83   Temp 98.6 F (37 C)   Resp 20   Ht _0  (1.778 m)   Wt 121.7 kg   SpO2 93%   BMI 38.50 kg/m  Vital signs in last 24 hours: Temp:  [97.5 F (36.4 C)-99.1 F (37.3 C)] 98.6 F (37 C) (09/11 0745) Pulse Rate:  [49-83] 83 (09/11 0745) Resp:  [18-20] 20 (09/11 0745) BP: (106-172)/(46-86) 161/67 (09/11 0600) SpO2:  [70 %-99 %] 93 % (09/11 0745) Arterial Line BP: (129-187)/(38-58) 187/57 (09/11 0700) FiO2 (%):  [40 %] 40 % (09/11 0745) Weight:  [121.7 kg] 121.7 kg (09/11 0500)  Intake/Output from previous day: 09/10 0701 - 09/11 0700 In: 2228.9 [I.V.:896.6; NG/GT:1040; IV Piggyback:292.2] Out: 1265 [Urine:1265] Intake/Output this shift: No intake/output data recorded. Nutritional status:  Diet Order             Diet NPO time specified  Diet effective now                  HEENT: [place]/AT Lungs: Intubated Ext: Bilateral BKA noted.    Neurologic Exam: Ment: Propofol sedation at a rate of 10 was held prior to neurological exam. Opens eyes partially after sustained noxious stimulation. No response to auditory stimuli. No purposeful movements but will flex BLE weakly to noxious. Does not fixate or track.  CN: PERRL 2 mm. Subtle blink response to corneal stimulation on the left, but not on the right. No doll's eye reflex detectable. Face flaccidly symmetric. Cough reflex requires deep stimulation to be triggered.  Motor/Sensory: Tone decreased x 4. Nonpurposeful simultaneous low-amplitude flexion and adduction of BLE and BUE to noxious stimuli.   Reflexes: No asymmetry.  Cerebellar/Gait: Unable to assess  Lab Results: Results for orders placed or performed during the hospital encounter of 10/29/2020 (from the past 48 hour(s))  Culture, Respiratory w Gram Stain     Status: None (Preliminary result)   Collection Time: 11/15/20  8:51 AM   Specimen: Tracheal Aspirate;  Respiratory  Result Value Ref Range   Specimen Description TRACHEAL ASPIRATE    Special Requests NONE    Gram Stain      FEW SQUAMOUS EPITHELIAL CELLS PRESENT FEW WBC SEEN RARE GRAM POSITIVE COCCI    Culture      CULTURE REINCUBATED FOR BETTER GROWTH Performed at [facility], [place] [address] [place], [place] [address]    Report Status PENDING   Lactic acid, plasma     Status: Abnormal   Collection Time: 11/15/20  8:59 AM  Result Value Ref Range   Lactic Acid, Venous 3.4 (HH) 0.5 - 1.9 mmol/L    Comment: CRITICAL VALUE NOTED.  VALUE IS CONSISTENT WITH PREVIOUSLY REPORTED AND CALLED VALUE. Performed at [facility], [place] [address], [place], [place] [address]   Renal function panel     Status: Abnormal   Collection Time: 11/15/20  9:02 AM  Result Value Ref Range   Sodium 142 135 - 145 mmol/L   Potassium 5.0 3.5 - 5.1 mmol/L   Chloride 112 (H) 98 - 111 mmol/L   CO2 20 (L) 22 - 32 mmol/L   Glucose, Bld 423 (H) 70 - 99 mg/dL    Comment: Glucose reference range applies only to samples taken after fasting for at least 8 hours.   BUN 61 (H) 8 - 23 mg/dL   Creatinine, Ser 2.60 (H) 0.61 - 1.24  mg/dL   Calcium 8.6 (L) 8.9 - 10.3 mg/dL   Phosphorus 3.7 2.5 - 4.6 mg/dL   Albumin 2.1 (L) 3.5 - 5.0 g/dL   GFR, Estimated 24 (L) >60 mL/min    Comment: (NOTE) Calculated using the CKD-EPI Creatinine Equation (2021)    Anion gap 10 5 - 15    Comment: Performed at [facility] [address], [place], [place] [address]  Troponin I (High Sensitivity)     Status: Abnormal   Collection Time: 11/15/20  9:02 AM  Result Value Ref Range   Troponin I (High Sensitivity) 193 (HH) <18 ng/L    Comment: CRITICAL VALUE NOTED.  VALUE IS CONSISTENT WITH PREVIOUSLY REPORTED AND CALLED VALUE. (NOTE) Elevated high sensitivity troponin I (hsTnI) values and significant  changes across serial measurements may suggest ACS but many other  chronic and acute conditions are known to elevate hsTnI  results.  Refer to the Links section for chest pain algorithms and additional  guidance. Performed at [facility], [place] [address] [place], [place] [address]   Protime-INR     Status: Abnormal   Collection Time: 11/15/20  9:02 AM  Result Value Ref Range   Prothrombin Time 20.4 (H) 11.4 - 15.2 seconds   INR 1.8 (H) 0.8 - 1.2    Comment: (NOTE) INR goal varies based on device and disease states. Performed at [facility], [place] [address], [place], [place] [address]   I-STAT 7, (LYTES, BLD GAS, ICA, H+H)     Status: Abnormal   Collection Time: 11/15/20  9:28 AM  Result Value Ref Range   pH, Arterial 7.392 7.350 - 7.450   pCO2 arterial 33.1 32.0 - 48.0 mmHg   pO2, Arterial 73 (L) 83.0 - 108.0 mmHg   Bicarbonate 20.2 20.0 - 28.0 mmol/L   TCO2 21 (L) 22 - 32 mmol/L   O2 Saturation 95.0 %   Acid-base deficit 4.0 (H) 0.0 - 2.0 mmol/L   Sodium 143 135 - 145 mmol/L   Potassium 4.8 3.5 - 5.1 mmol/L   Calcium, Ion 1.30 1.15 - 1.40 mmol/L   HCT 28.0 (L) 39.0 - 52.0 %   Hemoglobin 9.5 (L) 13.0 - 17.0 g/dL   Patient temperature 98.3 F    Sample type ARTERIAL   Glucose, capillary     Status: Abnormal   Collection Time: 11/15/20 11:11 AM  Result Value Ref Range   Glucose-Capillary 349 (H) 70 - 99 mg/dL    Comment: Glucose reference range applies only to samples taken after fasting for at least 8 hours.  Glucose, capillary     Status: Abnormal   Collection Time: 11/15/20 12:54 PM  Result Value Ref Range   Glucose-Capillary 201 (H) 70 - 99 mg/dL    Comment: Glucose reference range applies only to samples taken after fasting for at least 8 hours.  Glucose, capillary     Status: Abnormal   Collection Time: 11/15/20  1:51 PM  Result Value Ref Range   Glucose-Capillary 201 (H) 70 - 99 mg/dL    Comment: Glucose reference range applies only to samples taken after fasting for at least 8 hours.  Glucose, capillary     Status: Abnormal   Collection Time: 11/15/20  2:52 PM  Result  Value Ref Range   Glucose-Capillary 175 (H) 70 - 99 mg/dL    Comment: Glucose reference range applies only to samples taken after fasting for at least 8 hours.  Glucose, capillary  Status: Abnormal   Collection Time: 11/15/20  3:56 PM  Result Value Ref Range   Glucose-Capillary 162 (H) 70 - 99 mg/dL    Comment: Glucose reference range applies only to samples taken after fasting for at least 8 hours.  Glucose, capillary     Status: Abnormal   Collection Time: 11/15/20  5:00 PM  Result Value Ref Range   Glucose-Capillary 156 (H) 70 - 99 mg/dL    Comment: Glucose reference range applies only to samples taken after fasting for at least 8 hours.  Glucose, capillary     Status: Abnormal   Collection Time: 11/15/20  7:09 PM  Result Value Ref Range   Glucose-Capillary 149 (H) 70 - 99 mg/dL    Comment: Glucose reference range applies only to samples taken after fasting for at least 8 hours.  Glucose, capillary     Status: Abnormal   Collection Time: 11/15/20  8:24 PM  Result Value Ref Range   Glucose-Capillary 148 (H) 70 - 99 mg/dL    Comment: Glucose reference range applies only to samples taken after fasting for at least 8 hours.  APTT     Status: Abnormal   Collection Time: 11/15/20  8:27 PM  Result Value Ref Range   aPTT 94 (H) 24 - 36 seconds    Comment:        IF BASELINE aPTT IS ELEVATED, SUGGEST PATIENT RISK ASSESSMENT BE USED TO DETERMINE APPROPRIATE ANTICOAGULANT THERAPY. Performed at [facility], [place] [address] [place], [place] [address]   Heparin level (unfractionated)     Status: Abnormal   Collection Time: 11/15/20  8:27 PM  Result Value Ref Range   Heparin Unfractionated 1.06 (H) 0.30 - 0.70 IU/mL    Comment: (NOTE) The clinical reportable range upper limit is being lowered to >1.10 to align with the FDA approved guidance for the current laboratory assay.  If heparin results are below expected values, and patient dosage has  been confirmed, suggest follow  up testing of antithrombin III levels. Performed at [facility], [place] [address], [place], [place] [address]   Glucose, capillary     Status: Abnormal   Collection Time: 11/15/20  9:56 PM  Result Value Ref Range   Glucose-Capillary 131 (H) 70 - 99 mg/dL    Comment: Glucose reference range applies only to samples taken after fasting for at least 8 hours.  Glucose, capillary     Status: Abnormal   Collection Time: 11/15/20 10:59 PM  Result Value Ref Range   Glucose-Capillary 132 (H) 70 - 99 mg/dL    Comment: Glucose reference range applies only to samples taken after fasting for at least 8 hours.  Glucose, capillary     Status: Abnormal   Collection Time: 11/16/20 12:00 AM  Result Value Ref Range   Glucose-Capillary 133 (H) 70 - 99 mg/dL    Comment: Glucose reference range applies only to samples taken after fasting for at least 8 hours.  Glucose, capillary     Status: Abnormal   Collection Time: 11/16/20  1:05 AM  Result Value Ref Range   Glucose-Capillary 149 (H) 70 - 99 mg/dL    Comment: Glucose reference range applies only to samples taken after fasting for at least 8 hours.  Glucose, capillary     Status: Abnormal   Collection Time: 11/16/20  2:13 AM  Result Value Ref Range   Glucose-Capillary 146 (H) 70 - 99 mg/dL    Comment: Glucose reference range applies  only to samples taken after fasting for at least 8 hours.  Magnesium     Status: Abnormal   Collection Time: 11/16/20  2:24 AM  Result Value Ref Range   Magnesium 1.6 (L) 1.7 - 2.4 mg/dL    Comment: Performed at [facility] [address] [place], [place] [address]  Phosphorus     Status: Abnormal   Collection Time: 11/16/20  2:24 AM  Result Value Ref Range   Phosphorus 1.6 (L) 2.5 - 4.6 mg/dL    Comment: Performed at [facility] [address], [place], [place] [address]  Comprehensive metabolic panel     Status: Abnormal   Collection Time: 11/16/20  2:24 AM  Result Value Ref Range   Sodium 144  135 - 145 mmol/L   Potassium 3.7 3.5 - 5.1 mmol/L   Chloride 114 (H) 98 - 111 mmol/L   CO2 22 22 - 32 mmol/L   Glucose, Bld 194 (H) 70 - 99 mg/dL    Comment: Glucose reference range applies only to samples taken after fasting for at least 8 hours.   BUN 63 (H) 8 - 23 mg/dL   Creatinine, Ser 2.69 (H) 0.61 - 1.24 mg/dL   Calcium 8.6 (L) 8.9 - 10.3 mg/dL   Total Protein 5.5 (L) 6.5 - 8.1 g/dL   Albumin 1.8 (L) 3.5 - 5.0 g/dL   AST 298 (H) 15 - 41 U/L   ALT 414 (H) 0 - 44 U/L   Alkaline Phosphatase 66 38 - 126 U/L   Total Bilirubin 0.9 0.3 - 1.2 mg/dL   GFR, Estimated 23 (L) >60 mL/min    Comment: (NOTE) Calculated using the CKD-EPI Creatinine Equation (2021)    Anion gap 8 5 - 15    Comment: Performed at [facility], [place] [address] [place], [place] [address]  Triglycerides     Status: None   Collection Time: 11/16/20  2:24 AM  Result Value Ref Range   Triglycerides 64 <150 mg/dL    Comment: Performed at [facility] [address], [place], [place] [address]  Ammonia     Status: None   Collection Time: 11/16/20  2:24 AM  Result Value Ref Range   Ammonia 25 9 - 35 umol/L    Comment: Performed at [facility], [place] [address] [place], [place] [address]  Heparin level (unfractionated)     Status: Abnormal   Collection Time: 11/16/20  2:24 AM  Result Value Ref Range   Heparin Unfractionated 0.83 (H) 0.30 - 0.70 IU/mL    Comment: (NOTE) The clinical reportable range upper limit is being lowered to >1.10 to align with the FDA approved guidance for the current laboratory assay.  If heparin results are below expected values, and patient dosage has  been confirmed, suggest follow up testing of antithrombin III levels. Performed at [facility], [place] [address] [place], [place] [address]   APTT     Status: Abnormal   Collection Time: 11/16/20  2:24 AM  Result Value Ref Range   aPTT 132 (H) 24 - 36 seconds    Comment:        IF BASELINE aPTT IS  ELEVATED, SUGGEST PATIENT RISK ASSESSMENT BE USED TO DETERMINE APPROPRIATE ANTICOAGULANT THERAPY. SAMPLE CHECK OK 11/16/20 A JOHNSON  Performed at [facility], [place] [address], [place], [place] [address]   Glucose, capillary     Status: Abnormal   Collection Time:  11/16/20  3:10 AM  Result Value Ref Range   Glucose-Capillary 148 (H) 70 - 99 mg/dL    Comment: Glucose reference range applies only to samples taken after fasting for at least 8 hours.  CBC     Status: Abnormal   Collection Time: 11/16/20  3:30 AM  Result Value Ref Range   WBC 18.2 (H) 4.0 - 10.5 K/uL   RBC 3.19 (L) 4.22 - 5.81 MIL/uL   Hemoglobin 9.5 (L) 13.0 - 17.0 g/dL   HCT 28.4 (L) 39.0 - 52.0 %   MCV 89.0 80.0 - 100.0 fL   MCH 29.8 26.0 - 34.0 pg   MCHC 33.5 30.0 - 36.0 g/dL   RDW 14.1 11.5 - 15.5 %   Platelets 222 150 - 400 K/uL   nRBC 0.1 0.0 - 0.2 %    Comment: Performed at [facility], [place] [address], [place], [place] [address]  Glucose, capillary     Status: Abnormal   Collection Time: 11/16/20  4:00 AM  Result Value Ref Range   Glucose-Capillary 143 (H) 70 - 99 mg/dL    Comment: Glucose reference range applies only to samples taken after fasting for at least 8 hours.  I-STAT 7, (LYTES, BLD GAS, ICA, H+H)     Status: Abnormal   Collection Time: 11/16/20  4:38 AM  Result Value Ref Range   pH, Arterial 7.587 (H) 7.350 - 7.450   pCO2 arterial 24.0 (L) 32.0 - 48.0 mmHg   pO2, Arterial 142 (H) 83.0 - 108.0 mmHg   Bicarbonate 22.9 20.0 - 28.0 mmol/L   TCO2 24 22 - 32 mmol/L   O2 Saturation 100.0 %   Acid-Base Excess 2.0 0.0 - 2.0 mmol/L   Sodium 151 (H) 135 - 145 mmol/L   Potassium 3.7 3.5 - 5.1 mmol/L   Calcium, Ion 1.18 1.15 - 1.40 mmol/L   HCT 31.0 (L) 39.0 - 52.0 %   Hemoglobin 10.5 (L) 13.0 - 17.0 g/dL   Patient temperature 36.9 C    Sample type ARTERIAL   Glucose, capillary     Status: Abnormal   Collection Time: 11/16/20  5:11 AM  Result Value Ref Range   Glucose-Capillary 138 (H)  70 - 99 mg/dL    Comment: Glucose reference range applies only to samples taken after fasting for at least 8 hours.  Glucose, capillary     Status: Abnormal   Collection Time: 11/16/20  6:10 AM  Result Value Ref Range   Glucose-Capillary 103 (H) 70 - 99 mg/dL    Comment: Glucose reference range applies only to samples taken after fasting for at least 8 hours.  Glucose, capillary     Status: Abnormal   Collection Time: 11/16/20  6:55 AM  Result Value Ref Range   Glucose-Capillary 107 (H) 70 - 99 mg/dL    Comment: Glucose reference range applies only to samples taken after fasting for at least 8 hours.  Glucose, capillary     Status: Abnormal   Collection Time: 11/16/20  7:49 AM  Result Value Ref Range   Glucose-Capillary 148 (H) 70 - 99 mg/dL    Comment: Glucose reference range applies only to samples taken after fasting for at least 8 hours.  Glucose, capillary     Status: Abnormal   Collection Time: 11/16/20  9:36 AM  Result Value Ref Range   Glucose-Capillary 188 (H) 70 - 99 mg/dL    Comment: Glucose reference range applies only to samples taken after fasting for  at least 8 hours.  I-STAT 7, (LYTES, BLD GAS, ICA, H+H)     Status: Abnormal   Collection Time: 11/16/20  9:44 AM  Result Value Ref Range   pH, Arterial 7.408 7.350 - 7.450   pCO2 arterial 34.7 32.0 - 48.0 mmHg   pO2, Arterial 89 83.0 - 108.0 mmHg   Bicarbonate 22.0 20.0 - 28.0 mmol/L   TCO2 23 22 - 32 mmol/L   O2 Saturation 97.0 %   Acid-base deficit 2.0 0.0 - 2.0 mmol/L   Sodium 150 (H) 135 - 145 mmol/L   Potassium 3.8 3.5 - 5.1 mmol/L   Calcium, Ion 1.21 1.15 - 1.40 mmol/L   HCT 50.0 39.0 - 52.0 %   Hemoglobin 17.0 13.0 - 17.0 g/dL   Patient temperature 36.6 C    Sample type ARTERIAL   Glucose, capillary     Status: Abnormal   Collection Time: 11/16/20 10:41 AM  Result Value Ref Range   Glucose-Capillary 144 (H) 70 - 99 mg/dL    Comment: Glucose reference range applies only to samples taken after fasting for  at least 8 hours.  Glucose, capillary     Status: Abnormal   Collection Time: 11/16/20 11:34 AM  Result Value Ref Range   Glucose-Capillary 204 (H) 70 - 99 mg/dL    Comment: Glucose reference range applies only to samples taken after fasting for at least 8 hours.  Heparin level (unfractionated)     Status: Abnormal   Collection Time: 11/16/20  2:20 PM  Result Value Ref Range   Heparin Unfractionated 0.75 (H) 0.30 - 0.70 IU/mL    Comment: (NOTE) The clinical reportable range upper limit is being lowered to >1.10 to align with the FDA approved guidance for the current laboratory assay.  If heparin results are below expected values, and patient dosage has  been confirmed, suggest follow up testing of antithrombin III levels. Performed at [facility], [place] [address], [place], [place] [address]   APTT     Status: Abnormal   Collection Time: 11/16/20  2:20 PM  Result Value Ref Range   aPTT 153 (H) 24 - 36 seconds    Comment:        IF BASELINE aPTT IS ELEVATED, SUGGEST PATIENT RISK ASSESSMENT BE USED TO DETERMINE APPROPRIATE ANTICOAGULANT THERAPY. Performed at [facility], [place] [address], [place], [place] [address]   Glucose, capillary     Status: Abnormal   Collection Time: 11/16/20  3:28 PM  Result Value Ref Range   Glucose-Capillary 262 (H) 70 - 99 mg/dL    Comment: Glucose reference range applies only to samples taken after fasting for at least 8 hours.  APTT     Status: Abnormal   Collection Time: 11/16/20  5:42 PM  Result Value Ref Range   aPTT 120 (H) 24 - 36 seconds    Comment:        IF BASELINE aPTT IS ELEVATED, SUGGEST PATIENT RISK ASSESSMENT BE USED TO DETERMINE APPROPRIATE ANTICOAGULANT THERAPY. Performed at [facility], [place] [address] [place], [place] [address]   Glucose, capillary     Status: Abnormal   Collection Time: 11/16/20  7:40 PM  Result Value Ref Range   Glucose-Capillary 295 (H) 70 - 99 mg/dL    Comment: Glucose reference  range applies only to samples taken after fasting for at least 8 hours.  Glucose, capillary     Status: Abnormal   Collection Time: 11/16/20 11:44 PM  Result Value Ref Range   Glucose-Capillary 360 (H) 70 - 99 mg/dL    Comment: Glucose reference range applies only to samples taken after fasting for at least 8 hours.  Glucose, capillary     Status: Abnormal   Collection Time: 11/17/20  3:34 AM  Result Value Ref Range   Glucose-Capillary 358 (H) 70 - 99 mg/dL    Comment: Glucose reference range applies only to samples taken after fasting for at least 8 hours.  Heparin level (unfractionated)     Status: None   Collection Time: 11/17/20  4:02 AM  Result Value Ref Range   Heparin Unfractionated 0.48 0.30 - 0.70 IU/mL    Comment: (NOTE) The clinical reportable range upper limit is being lowered to >1.10 to align with the FDA approved guidance for the current laboratory assay.  If heparin results are below expected values, and patient dosage has  been confirmed, suggest follow up testing of antithrombin III levels. Performed at [facility], [place] [address] [place], [place] [address]   APTT     Status: Abnormal   Collection Time: 11/17/20  4:02 AM  Result Value Ref Range   aPTT 83 (H) 24 - 36 seconds    Comment:        IF BASELINE aPTT IS ELEVATED, SUGGEST PATIENT RISK ASSESSMENT BE USED TO DETERMINE APPROPRIATE ANTICOAGULANT THERAPY. Performed at [facility], [place] [address], [place], [place] [address]   Magnesium     Status: None   Collection Time: 11/17/20  4:02 AM  Result Value Ref Range   Magnesium 2.0 1.7 - 2.4 mg/dL    Comment: Performed at [facility] [address] [place], [place] [address]  Phosphorus     Status: None   Collection Time: 11/17/20  4:02 AM  Result Value Ref Range   Phosphorus 4.1 2.5 - 4.6 mg/dL    Comment: Performed at [facility] [address], [place], [place] [address]  Glucose, capillary     Status: Abnormal    Collection Time: 11/17/20  7:53 AM  Result Value Ref Range   Glucose-Capillary 238 (H) 70 - 99 mg/dL    Comment: Glucose reference range applies only to samples taken after fasting for at least 8 hours.    Recent Results (from the past 240 hour(s))  MRSA Next Gen by PCR, Nasal     Status: None   Collection Time: 11/15/20  1:30 AM   Specimen: Nasal Mucosa; Nasal Swab  Result Value Ref Range Status   MRSA by PCR Next Gen NOT DETECTED NOT DETECTED Final    Comment: (NOTE) The GeneXpert MRSA Assay (FDA approved for NASAL specimens only), is one component of a comprehensive MRSA colonization surveillance program. It is not intended to diagnose MRSA infection nor to guide or monitor treatment for MRSA infections. Test performance is not FDA approved in patients less than 27 years old. Performed at [facility], [place] [address] [place], [place] [address]   Culture, blood (routine x 2)     Status: None (Preliminary result)   Collection Time: 11/15/20  5:36 AM   Specimen: BLOOD LEFT HAND  Result Value Ref Range Status   Specimen Description BLOOD LEFT HAND  Final   Special Requests   Final    AEROBIC BOTTLE ONLY Blood Culture results may not be optimal due to an excessive volume of blood received in culture bottles   Culture   Final  NO GROWTH 1 DAY Performed at [facility], [place] [address], [place], [place] [address]    Report Status PENDING  Incomplete  Culture, blood (routine x 2)     Status: None (Preliminary result)   Collection Time: 11/15/20  5:36 AM   Specimen: BLOOD RIGHT HAND  Result Value Ref Range Status   Specimen Description BLOOD RIGHT HAND  Final   Special Requests   Final    AEROBIC BOTTLE ONLY Blood Culture results may not be optimal due to an excessive volume of blood received in culture bottles   Culture   Final    NO GROWTH 1 DAY Performed at [facility], [place] [address] [place], [place] [address]    Report Status PENDING  Incomplete   Culture, Respiratory w Gram Stain     Status: None (Preliminary result)   Collection Time: 11/15/20  8:51 AM   Specimen: Tracheal Aspirate; Respiratory  Result Value Ref Range Status   Specimen Description TRACHEAL ASPIRATE  Final   Special Requests NONE  Final   Gram Stain   Final    FEW SQUAMOUS EPITHELIAL CELLS PRESENT FEW WBC SEEN RARE GRAM POSITIVE COCCI    Culture   Final    CULTURE REINCUBATED FOR BETTER GROWTH Performed at [facility], [place] [address] [place], [place] [address]    Report Status PENDING  Incomplete    Lipid Panel Recent Labs    11/16/20 0224  TRIG 64    Studies/Results: EEG adult  Result Date: 11/15/2020 Lora Havens, MD     11/15/2020 11:11 AM Patient Name: Nicolaos Mitrano MRN: [id number] Epilepsy Attending: Lora Havens Referring Physician/Provider: Hayden Pedro, NP Date: 11/15/2020 Duration: 24.37 mins Patient history: 80 year old male with altered mental status and concern for anoxic brain injury noted to have abnormal movements concerning for myoclonus or seizure.  EEG to evaluate for seizures. Level of alertness: comatose AEDs during EEG study: Propofol Technical aspects: This EEG study was done with scalp electrodes positioned according to the 10-20 International system of electrode placement. Electrical activity was acquired at a sampling rate of _0  and reviewed with a high frequency filter of _1  and a low frequency filter of _2 . EEG data were recorded continuously and digitally stored. Description: EEG showed continuous generalized background suppression.  Every 30 seconds to 1 minute patient was noted to have brief whole-body jerk with eye opening.  Concomitant EEG showed generalized polyspikes consistent with myoclonic seizure.  Hyperventilation and photic stimulation were not performed.   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  During the seizure patient was noted to have brief full body jerks with eye opening.  Additionally there was profound diffuse encephalopathy.  This EEG is concerning for anoxic/hypoxic brain injury. Jennelle Human, NP was notified. Priyanka [name]   Overnight EEG with video  Result Date: 11/16/2020 Lora Havens, MD     11/16/2020 10:12 AM Patient Name: Decker Cogdell MRN: [id number] Epilepsy Attending: Lora Havens Referring Physician/Provider: Hayden Pedro, NP Duration: 11/15/2020 4008 to 11/16/2020 0959  Patient history: 80 year old male with altered mental status and concern for anoxic brain injury noted to have abnormal movements concerning for myoclonus or seizure.  EEG to evaluate for seizures.  Level of alertness: comatose  AEDs during EEG study: Propofol, LEV  Technical aspects: This EEG study was done with scalp electrodes positioned according to the  10-20 International system of electrode placement. Electrical activity was acquired at a sampling rate of _0  and reviewed with a high frequency filter of _1  and a low frequency filter of _2 . EEG data were recorded continuously and digitally stored.  Description:   At the beginning of the study, every 30 seconds to 1 minute patient was noted to have brief whole-body jerk with eye opening.  Concomitant EEG showed generalized polyspikes consistent with myoclonic seizure. As propofol was adjusted, last seizure was noted on 11/15/2020 at 1121   EEG initially showed continuous generalized background suppression. After around 1800on 11/15/2020, eeg showed bursts of low amplitude 6-_3  theta-alpha activity lasting 3-5 seconds alternating with generalized eeg suppression lasting 2-8 seconds.  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  During the seizure patient was noted to  have brief full body jerks with eye opening. As sedation was adjusted, myoclonic seizures abated, last seizure on 11/15/2020 at 1121.  Additionally there was severe to profound diffuse encephalopathy. This EEG is concerning for anoxic/hypoxic brain injury. EEG appears to be improving compared to previous day. Priyanka [name]    Medications: Scheduled:  acetaminophen  650 mg Oral Q4H   Or   acetaminophen (TYLENOL) oral liquid 160 mg/5 mL  650 mg Per Tube Q4H   Or   acetaminophen  650 mg Rectal Q4H   busPIRone  30 mg Oral Q8H   Or   busPIRone  30 mg Per Tube Q8H   chlorhexidine gluconate (MEDLINE KIT)  15 mL Mouth Rinse BID   Chlorhexidine Gluconate Cloth  6 each Topical Daily   cholecalciferol  5,000 Units Per Tube Daily   docusate  100 mg Per Tube Daily   insulin aspart  0-15 Units Subcutaneous Q4H   insulin aspart  2 Units Subcutaneous Q4H   insulin detemir  8 Units Subcutaneous Q12H   mouth rinse  15 mL Mouth Rinse 10 times per day   multivitamin with minerals  1 tablet Per Tube Daily   pantoprazole sodium  40 mg Per Tube Daily   polyethylene glycol  17 g Per Tube Daily   Continuous:  sodium chloride 10 mL/hr at 11/17/20 0400   dexmedetomidine (PRECEDEX) IV infusion     dextrose     feeding supplement (VITAL AF 1.2 CAL) 1,000 mL (11/17/20 0758)   heparin 1,450 Units/hr (11/17/20 0400)   insulin Stopped (11/16/20 1038)   levETIRAcetam Stopped (11/17/20 0036)   norepinephrine (LEVOPHED) Adult infusion Stopped (11/16/20 2536)   piperacillin-tazobactam (ZOSYN)  IV 3.375 g (11/17/20 0624)   propofol (DIPRIVAN) infusion 40 mcg/kg/min (11/17/20 0400)    CT Head without contrast 11/15/2020: Atrophy, chronic microvascular disease. No acute intracranial abnormality.     Assessment: 80 year old male status post cardiac arrest requiring 15 minutes of CPR prior to ROSC and subsequently noted to have myoclonic seizures. -Exam today (low-dose propofol held before starting exam): Depressed  brainstem reflexes but will partially open eyes to noxious and will cough to deep stimulation. Nonpurposeful simultaneous low-amplitude flexion and adduction of BLE and BUE to noxious stimuli.   -Exam yesterday performed 10 minutes after propofol was held: Depressed brainstem reflexes but will partially open eyes to noxious and will cough to deep stimulation. Nonpurposeful flexion of BLE to noxious stimuli. No movement of upper extremities to stimulation.  - EEGs: - EEG from Friday: This study showed myoclonic seizures with generalized onset every 30 seconds  to 1 minute.  During the seizure patient was noted to have brief full body jerks with eye opening.  Additionally there was profound diffuse encephalopathy.  This EEG is concerning for anoxic/hypoxic brain injury. - LTM EEG report from Saturday AM: This study initially showed myoclonic seizures with generalized onset every 30 seconds to 1 minute.  During the seizure patient was noted to have brief full body jerks with eye opening. As sedation was adjusted, myoclonic seizures abated, last seizure on 11/15/2020 at 1121.  Additionally there was severe to profound diffuse encephalopathy. This EEG is concerning for anoxic/hypoxic brain injury. However, EEG has started to show some normal brain activity as well. I would suggest leaving on propofol today and maybe wean tomorrow.  - Summary of LTM EEG report for today AM: Abnormal findings of generalized burst suppression and generalized continuous slowing. The study was initially with findings most consistent with profound diffuse encephalopathy. Gradually EEG improved and around 2100 on 11/17/2020, EEG was suggestive of severe diffuse encephalopathy. This EEG pattern is non specific in etiology but could be secondary to sedation, anoxic-hypoxic brain injury in setting of cardiac arrest, or a combination of the two.  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 May consider addition of phenobarbital if myoclonic seizures recur -Propofol gtt currently at 10 mcg/kg/hr. Propofol can be increased further for clinical seizure suppression as needed -Discontinuing LTM EEG to obtain MRI brain. If MRI does not show severe anoxic brain injury, then will restart LTM.  -Prognosis: Myoclonic seizures within the first 24 hours are usually suggestive of significant anoxic brain injury.  However, he has cranial nerve reflexes and withdraws to noxious stimuli in both upper extremities.  Therefore we will attempt to treat seizures and obtain MRI brain without contrast at 72 hours to look for the extent of anoxic/hypoxic brain injury.  Wife states patient would not want tracheostomy and feeding tube.   -As needed IV Versed 4 mg for clinical seizure-like activity -Continue seizure precautions -Management of rest of comorbidities per primary team  35 minutes spent in the neurological evaluation and management of this critically ill patient.    LOS: 10 days   _0  signed: Dr. Kerney Elbe 11/17/2020  8:16 AM

## 2020-11-17 NOTE — Progress Notes (Signed)
NAME:  Patrick Shelton, MRN:  [id number], DOB:  18-Aug-1940, LOS: 69 ADMISSION DATE:  10/18/2020, CONSULTATION DATE:  11/15/2020 REFERRING MD:  Dr. Tonie Griffith, CHIEF COMPLAINT:  Cardiac Arrest    History of Present Illness:  80 year old male presents to ED on 0000000 with complicated gangreof Left 4th toe. Vascular consulted. 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 Stay complicated by acute on chronic renal failure, hyperkalemia, encephalopathy, and respiratory distress.   9/8 patient with hypoxia and labored breathing with productive cough with white clear frothy sputum. Given lasix. 9/9 patient found unresponsive and pulseless. PEA. Required 15 minutes of CPR, 1 Calcium, 4 EPI, and 1 sodium Bicarb prior to ROSC. On arrival to ICU patient is hypotensive, bradycardiac, intubated, and unresponsive.   Pertinent  Medical History  DM, HTN, HLD, PAD, A.Fib on Eliquis, s/p Right BKA, now Left BKA   Significant Hospital Events: Including procedures, antibiotic start and stop dates in addition to other pertinent events   9/1 Presents to ED with suspected osteo of 4th toe to LLE  9/6 L BKA 9/9 early am cardiac arrest, prolonged, intubated, on pressors  Interim History / Subjective:  Hypertensive overnight, sometimes with stimulation. No seizure activity.   Objective   Blood pressure (!) 161/67, pulse 83, temperature 98.6 F (37 C), resp. rate 20, height '5\' 10"'$  (1.778 m), weight 121.7 kg, SpO2 93 %.    Vent Mode: PRVC FiO2 (%):  [40 %] 40 % Set Rate:  [18 bmp] 18 bmp Vt Set:  [580 mL] 580 mL PEEP:  [5 cmH20] 5 cmH20 Plateau Pressure:  [14 cmH20-24 cmH20] 24 cmH20   Intake/Output Summary (Last 24 hours) at 11/17/2020 0841 Last data filed at 11/17/2020 0650 Gross per 24 hour  Intake 2228.86 ml  Output 1265 ml  Net 963.86 ml    Filed Weights   11/10/20 1900 11/15/20 1845 11/17/20 0500  Weight: 117.9 kg 121.3 kg 121.7 kg    Examination: General: Critically ill-appearing man lying in bed in no acute  distress intubated, sedated on propofol HEENT: [place]/AT, eyes anicteric, endotracheal tube in place Neuro: Examined on propofol-weaning down.  No observed myoclonus.  Pinpoint pupils, negative corneal and doll's eyes reflexes.  No pharyngeal gag, but strong cough reflex.  Withdrawing from pain in bilateral lower extremities and left upper extremity, not right upper extremity.  No response to verbal stimulation.  Breathing over the vent. CV: Regular rate and rhythm with occasional PVCs PULM: Thick purulent secretions from endotracheal tube, clear to auscultation after suctioning.  Reduced basilar breath sounds. GI: Obese, soft, nontender, nondistended Extremities: Bilateral BKA's, no peripheral edema or cyanosis Skin: No rashes, skin warm and dry.   BMP pending. Respiratory culture-rare GPCs Blood cultures-no growth to date  Resolved Hospital Problem list     Assessment & Plan:   Acute encephalopathy, concern for anoxic injury.  Myoclonic seizure activity Plan -Appreciate neurology's assistance.  Continue Keppra.  Weaning propofol today for neuro assessment.  Will defer to them if they feel that an MRI would be helpful.   - cEEG per neurology. - Continue neuroprotective measures and avoid fevers -Continue seizure precautions  PEA cardiac arrest, unclear etiology, likely respiratory related given above recent events and increasing resp distress 9/8, less likely PE given patient has been on heparin gtt then transitioned to eliquis.  Has had evidence of elevated BNP and signs of edema on CXR and failed his SLP on 9/8, with some concern for esophageal issues/ obstruction, but OGT was placed successfully.      -  Continue supportive care measures - Goal normothermia, normal electrolytes, euglycemia  Hypertension -Labetalol as needed. -Con't to hold metoprolol 25 mg bid, norvasc '5mg'$ . If he remains hypertensive and can stay off sedation, can resume these. - If maintaining off sedation, can add  longer acting medications  Shock, suspect related to sedating medications> resolved -echo pending> will discuss with cardiology  A.Fib - previous TTE in 05/2020 with normal EF, G1DD. Repeat echo pending. - Con't to hold lopressor 25 mg bid, norvasc '5mg'$ . If he remains hypertensive and can stay off sedation, can resume these. -Heparin rather than eliquis given potential need for procedures in ICU. Can transition to lovenox if renal function improves. - Tele monitoring - BMP pending  Acute hypoxic respiratory failure requiring MV Bilateral lower lobe pneumonia, likely due to aspiration  Likely component of acute pulmonary edema previously -LTVV, 4-8cc/kg IBW with goal Pplat<30 and DP<15 -decreasing sedation for myoclonus suppression today; PAD protocol -VAP prevention protocol - Daily SAT and SBT-failed SBT due to tachypnea today, but was performed before sedation could be weaned.  Mental status currently precludes extubation.  We will continue to assess -Wean FiO2 as able to maintain SPO2 greater than 90%. -Continue to follow respiratory cultures.  Continue Zosyn.  Anticipate that he will require 7 days of antibiotics - SLP eval 9/8 questioned some esophogeal obstruction & recommended esophagram.  Further workup pending neuro recovery.   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 -S/P Rocephin/Flagyl/Vancomycin for 7 days completed 9/7 Plan -Continue to follow blood cultures from 9/9 until finalized - Appreciate vascular surgery's management  Anion Gap Metabolic Acidosis, due to lactic acidosis> resolved Acute on Chronic Renal Failure 2/2 to hypoperfusion associated with cardiac arrest Oliguria improved-1.2 L of urine output in the past 24 hours Hyperkalemia resolved Mild hyperchloremia Plan -Repeat BMP pending to monitor renal function -Strict I's/O - Renally dose meds and avoid nephrotoxic medications - Continue Foley catheter - Maintain adequate renal  perfusion  Elevated LFT, suspect secondary to hypoperfusion/ shock-improving Hyperammonemia, resolved -No additional monitoring required  Hyperglycemia  DM; A1c 7.2 -Increase long-acting insulin to 20 units twice daily - Tube feeding coverage 2 units every 4h with hold parameters - Sliding scale insulin as needed - Goal blood glucose 140-180 while in the ICU  Hypophosphatemia, resolved Hypomagnesemia, resolved  Chronic normocytic anemia -Transfuse for hemoglobin less than 7 or hemodynamically significant bleeding  Will update wife at bedside later today.  She has been updated the last 2 days.  Daughter is in [place] for 2 weeks.  Best Practice (right click and "Reselect all SmartList Selections" daily)   Diet/type: tubefeeds DVT prophylaxis: systemic heparin GI prophylaxis: PPI Lines: Central line Foley:  Yes, and it is still needed Code Status:  DNR Last date of multidisciplinary goals of care discussion: 9/9 with wife  Labs   CBC: Recent Labs  Lab 11/13/20 0408 11/14/20 0108 11/15/20 0102 11/15/20 0135 11/15/20 0206 11/15/20 0207 11/15/20 [id number] 11/16/20 0330 11/16/20 0438 11/16/20 0944  WBC 24.7* 25.3* 26.0* 23.3*  --   --   --  18.2*  --   --   NEUTROABS  --   --  17.1*  --   --   --   --   --   --   --   HGB 10.6* 11.1* 11.3* 10.9*   < > 10.9* 9.5* 9.5* 10.5* 17.0  HCT 33.6* 36.0* 36.9* 35.6*   < > 32.0* 28.0* 28.4* 31.0* 50.0  MCV 94.6 97.3 99.5 98.3  --   --   --  89.0  --   --   PLT 345 248 267 250  --   --   --  222  --   --    < > = values in this interval not displayed.     Basic Metabolic Panel: Recent Labs  Lab 11/14/20 1523 11/15/20 0102 11/15/20 0135 11/15/20 0206 11/15/20 0207 11/15/20 0902 11/15/20 [id number] 11/16/20 0224 11/16/20 0438 11/16/20 0944 11/17/20 0402  NA 140 143 143   < > 145 142 143 144 151* 150*  --   K 5.0 5.4* 5.2*   < > 5.0 5.0 4.8 3.7 3.7 3.8  --   CL 107 109 108  --  113* 112*  --  114*  --   --   --   CO2 26 20* 19*   --   --  20*  --  22  --   --   --   GLUCOSE 233* 321* 333*  --  358* 423*  --  194*  --   --   --   BUN 48* 52* 52*  --  50* 61*  --  63*  --   --   --   CREATININE 2.11* 2.15* 2.22*  --  2.00* 2.60*  --  2.69*  --   --   --   CALCIUM 8.5* 9.3 9.2  --   --  8.6*  --  8.6*  --   --   --   MG  --  2.0  --   --   --   --   --  1.6*  --   --  2.0  PHOS  --   --   --   --   --  3.7  --  1.6*  --   --  4.1   < > = values in this interval not displayed.    GFR: Estimated Creatinine Clearance: 28.7 mL/min (A) (by C-G formula based on SCr of 2.69 mg/dL (H)). Recent Labs  Lab 11/14/20 0108 11/15/20 0102 11/15/20 0135 11/15/20 0536 11/15/20 0859 11/16/20 0330  WBC 25.3* 26.0* 23.3*  --   --  18.2*  LATICACIDVEN  --  6.1*  --  4.5* 3.4*  --      Liver Function Tests: Recent Labs  Lab 11/15/20 0102 11/15/20 0902 11/16/20 0224  AST 918*  --  298*  ALT 452*  --  414*  ALKPHOS 95  --  66  BILITOT 0.6  --  0.9  PROT 6.5  --  5.5*  ALBUMIN 2.2* 2.1* 1.8*    No results for input(s): LIPASE, AMYLASE in the last 168 hours. Recent Labs  Lab 11/15/20 0131 11/16/20 0224  AMMONIA 68* 25     ABG    Component Value Date/Time   PHART 7.408 11/16/2020 0944   PCO2ART 34.7 11/16/2020 0944   PO2ART 89 11/16/2020 0944   HCO3 22.0 11/16/2020 0944   TCO2 23 11/16/2020 0944   ACIDBASEDEF 2.0 11/16/2020 0944   O2SAT 97.0 11/16/2020 0944       This patient is critically ill with multiple organ system failure which requires frequent high complexity decision making, assessment, support, evaluation, and titration of therapies. This was completed through the application of advanced monitoring technologies and extensive interpretation of multiple databases. During this encounter critical care time was devoted to patient care services described in this note for 41mnutes.   LJulian Hy DO 11/17/20 9:23 AM [facility]

## 2020-11-18 ENCOUNTER — Inpatient Hospital Stay (HOSPITAL_COMMUNITY): Payer: Medicare Other

## 2020-11-18 DIAGNOSIS — L03116 Cellulitis of left lower limb: Secondary | ICD-10-CM | POA: Diagnosis not present

## 2020-11-18 DIAGNOSIS — I469 Cardiac arrest, cause unspecified: Secondary | ICD-10-CM | POA: Diagnosis not present

## 2020-11-18 LAB — COMPREHENSIVE METABOLIC PANEL
ALT: 173 U/L — ABNORMAL HIGH (ref 0–44)
AST: 42 U/L — ABNORMAL HIGH (ref 15–41)
Albumin: 1.6 g/dL — ABNORMAL LOW (ref 3.5–5.0)
Alkaline Phosphatase: 79 U/L (ref 38–126)
Anion gap: 7 (ref 5–15)
BUN: 67 mg/dL — ABNORMAL HIGH (ref 8–23)
CO2: 23 mmol/L (ref 22–32)
Calcium: 8.3 mg/dL — ABNORMAL LOW (ref 8.9–10.3)
Chloride: 117 mmol/L — ABNORMAL HIGH (ref 98–111)
Creatinine, Ser: 3.05 mg/dL — ABNORMAL HIGH (ref 0.61–1.24)
GFR, Estimated: 20 mL/min — ABNORMAL LOW (ref >60)
Glucose, Bld: 306 mg/dL — ABNORMAL HIGH (ref 70–99)
Potassium: 4.1 mmol/L (ref 3.5–5.1)
Sodium: 147 mmol/L — ABNORMAL HIGH (ref 135–145)
Total Bilirubin: 0.5 mg/dL (ref 0.3–1.2)
Total Protein: 5.7 g/dL — ABNORMAL LOW (ref 6.5–8.1)

## 2020-11-18 LAB — ECHOCARDIOGRAM COMPLETE
AR max vel: 1.87 cm2
AV Area VTI: 2.06 cm2
AV Area mean vel: 1.97 cm2
AV Mean grad: 10 mmHg
AV Peak grad: 17.8 mmHg
Ao pk vel: 2.11 m/s
Area-P 1/2: 3.73 cm2
Height: 70 in
S' Lateral: 3 cm
Single Plane A4C EF: 56.2 %
Weight: 4158.76 oz

## 2020-11-18 LAB — CBC
HCT: 29.5 % — ABNORMAL LOW (ref 39.0–52.0)
Hemoglobin: 9.2 g/dL — ABNORMAL LOW (ref 13.0–17.0)
MCH: 29.4 pg (ref 26.0–34.0)
MCHC: 31.2 g/dL (ref 30.0–36.0)
MCV: 94.2 fL (ref 80.0–100.0)
Platelets: 183 10*3/uL (ref 150–400)
RBC: 3.13 MIL/uL — ABNORMAL LOW (ref 4.22–5.81)
RDW: 15.2 % (ref 11.5–15.5)
WBC: 14.8 10*3/uL — ABNORMAL HIGH (ref 4.0–10.5)
nRBC: 0.1 % (ref 0.0–0.2)

## 2020-11-18 LAB — PHOSPHORUS: Phosphorus: 4.5 mg/dL (ref 2.5–4.6)

## 2020-11-18 LAB — GLUCOSE, CAPILLARY
Glucose-Capillary: 127 mg/dL — ABNORMAL HIGH (ref 70–99)
Glucose-Capillary: 153 mg/dL — ABNORMAL HIGH (ref 70–99)
Glucose-Capillary: 156 mg/dL — ABNORMAL HIGH (ref 70–99)
Glucose-Capillary: 157 mg/dL — ABNORMAL HIGH (ref 70–99)
Glucose-Capillary: 160 mg/dL — ABNORMAL HIGH (ref 70–99)
Glucose-Capillary: 164 mg/dL — ABNORMAL HIGH (ref 70–99)
Glucose-Capillary: 164 mg/dL — ABNORMAL HIGH (ref 70–99)
Glucose-Capillary: 177 mg/dL — ABNORMAL HIGH (ref 70–99)
Glucose-Capillary: 184 mg/dL — ABNORMAL HIGH (ref 70–99)
Glucose-Capillary: 200 mg/dL — ABNORMAL HIGH (ref 70–99)
Glucose-Capillary: 210 mg/dL — ABNORMAL HIGH (ref 70–99)
Glucose-Capillary: 227 mg/dL — ABNORMAL HIGH (ref 70–99)
Glucose-Capillary: 229 mg/dL — ABNORMAL HIGH (ref 70–99)
Glucose-Capillary: 249 mg/dL — ABNORMAL HIGH (ref 70–99)
Glucose-Capillary: 254 mg/dL — ABNORMAL HIGH (ref 70–99)
Glucose-Capillary: 262 mg/dL — ABNORMAL HIGH (ref 70–99)
Glucose-Capillary: 271 mg/dL — ABNORMAL HIGH (ref 70–99)
Glucose-Capillary: 287 mg/dL — ABNORMAL HIGH (ref 70–99)

## 2020-11-18 LAB — HEPARIN LEVEL (UNFRACTIONATED): Heparin Unfractionated: 0.44 IU/mL (ref 0.30–0.70)

## 2020-11-18 LAB — MAGNESIUM: Magnesium: 2.1 mg/dL (ref 1.7–2.4)

## 2020-11-18 MED ORDER — FENTANYL BOLUS VIA INFUSION
25.0000 ug | INTRAVENOUS | Status: DC | PRN
  Filled 2020-11-18: qty 100

## 2020-11-18 MED ORDER — FENTANYL 2500MCG IN NS 250ML (10MCG/ML) PREMIX INFUSION
25.0000 ug/h | INTRAVENOUS | Status: DC
Start: 2020-11-18 — End: 2020-11-28
  Administered 2020-11-18: 25 ug/h via INTRAVENOUS
  Administered 2020-11-19: 75 ug/h via INTRAVENOUS
  Administered 2020-11-22: 100 ug/h via INTRAVENOUS
  Administered 2020-11-24: 125 ug/h via INTRAVENOUS
  Administered 2020-11-25: 100 ug/h via INTRAVENOUS
  Administered 2020-11-26: 150 ug/h via INTRAVENOUS
  Administered 2020-11-26: 100 ug/h via INTRAVENOUS
  Administered 2020-11-27: 125 ug/h via INTRAVENOUS
  Filled 2020-11-18 (×9): qty 250

## 2020-11-18 MED FILL — Medication: Qty: 1 | Status: AC

## 2020-11-18 NOTE — Progress Notes (Addendum)
Subjective: Has been on propofol overnight.  No clinical seizures per RN this morning.  ROS: Unable to obtain due to poor mental status  Examination  Vital signs in last 24 hours: Temp:  [97.3 F (36.3 C)-99.9 F (37.7 C)] 97.3 F (36.3 C) (09/12 0930) Pulse Rate:  [61-91] 66 (09/12 0930) Resp:  [17-30] 18 (09/12 0930) BP: (91-179)/(37-89) 138/57 (09/12 0900) SpO2:  [91 %-98 %] 95 % (09/12 0930) Arterial Line BP: (132-206)/(34-65) 142/43 (09/12 0930) FiO2 (%):  [40 %] 40 % (09/12 0600) Weight:  GI:4022782 kg] 118 kg (09/12 0500)  Constitutional: Appears well-developed and well-nourished.  Head: Normocephalic, EEG leads in place Cardiovascular: Normal rate and regular rhythm.  Respiratory: CTAB, intubated Neuro: On propofol at 24mg/hr, pupils small but reactive to light, corneal reflex absent, gag reflex absent for me but per RN did have cough reflex earlier, withdraws to noxious stimuli in bilateral upper extremities, bilateral lower extremity BKA  Basic Metabolic Panel: Recent Labs  Lab 11/15/20 0102 11/15/20 0135 11/15/20 0206 11/15/20 0207 11/15/20 0902 11/15/20 [id number] 0224 11/16/20 [id number] 0944 11/17/20 0402 11/17/20 1005 11/18/20 0522  NA 143 143   < > 145 142   < > 144 151* 150*  --  143 147*  K 5.4* 5.2*   < > 5.0 5.0   < > 3.7 3.7 3.8  --  4.5 4.1  CL 109 108  --  113* 112*  --  114*  --   --   --  112* 117*  CO2 20* 19*  --   --  20*  --  22  --   --   --  23 23  GLUCOSE 321* 333*  --  358* 423*  --  194*  --   --   --  460* 306*  BUN 52* 52*  --  50* 61*  --  63*  --   --   --  68* 67*  CREATININE 2.15* 2.22*  --  2.00* 2.60*  --  2.69*  --   --   --  2.80* 3.05*  CALCIUM 9.3 9.2  --   --  8.6*  --  8.6*  --   --   --  8.1* 8.3*  MG 2.0  --   --   --   --   --  1.6*  --   --  2.0  --  2.1  PHOS  --   --   --   --  3.7  --  1.6*  --   --  4.1  --  4.5   < > = values in this interval not displayed.    CBC: Recent Labs  Lab 11/15/20 0102  11/15/20 0135 11/15/20 0206 11/16/20 0330 11/16/20 0438 11/16/20 0944 11/17/20 1005 11/18/20 0522  WBC 26.0* 23.3*  --  18.2*  --   --  18.6* 14.8*  NEUTROABS 17.1*  --   --   --   --   --   --   --   HGB 11.3* 10.9*   < > 9.5* 10.5* 17.0 10.4* 9.2*  HCT 36.9* 35.6*   < > 28.4* 31.0* 50.0 32.1* 29.5*  MCV 99.5 98.3  --  89.0  --   --  93.0 94.2  PLT 267 250  --  222  --   --  212 183   < > = values in this interval not displayed.     Coagulation Studies: No results for input(s):  LABPROT, INR in the last 72 hours.  Imaging MRI brain without contrast 11/17/2020: Findings consistent with diffuse anoxic injury involving the cortical gray matter of both cerebral hemispheres as well as the deep gray nuclei. Relative sparing of the brainstem and cerebellum. Underlying moderately advanced cerebral atrophy. 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     ASSESSMENT AND PLAN: 80 year old male status post cardiac arrest requiring 15 minutes of CPR prior to ROSC and subsequently noted to have myoclonic seizures.   Cardiac arrest Anoxic/toxic encephalopathy Myoclonic seizures AKI on CKD Diabetes - LTM was suggestive of severe to profound diffuse encephalopathy. This eeg pattern is non specific in etiology but could be secondary to sedation, anoxic-hypoxic brain injury in setting of cardiac arrest. Patient event button was pressed on 11/17/2020 at 1022 during which patient had repetitive blinking without concomitant EEG change.  This was likely not an epileptic event.  Differential would include myoclonus. No definite seizures were seen during the study.   Recommendations: -Continue Keppra 500 mg twice daily (renally adjusted) -Stop propofol for neuro-prognostication.  -Resume LTM eeg to assess for seizures as we stop sedation.  -Discussed with wife at bedside that myoclonic seizures within the first 24 hours are usually suggestive of significant anoxic brain injury. Additionally,  MRI brain showed diffuse anoxic brain injury. However, he has cranial nerve reflexes and withdraws to noxious stimuli in all extremities.  Therefore, we will wean sedation.  After weaning sedation, will watch for improvement in patient's neurological status for about 72 hours.  Again discussed with wife and son that if patient remains comatose or seizures recur, this is likely suggestive of significant irreversible brain injury with minimal to no chances of meaningful neurological recovery.  Family planning on calling patient's daughter to return who is on her trip to [place].  If patient continues to remain comatose or as seizure recurrence, family will consider transitioning to comfort care.  Of note, I also discussed with family that it is possible the patient can have another cardiac arrest or hemodynamic instability.  Family understands and they would likely transition him to comfort care if that happens -If seizures recur, okay to resume sedation -Updated critical care Dr Ruthann Cancer about my discussion with wife and son -As needed IV Versed 4 mg for clinical seizure-like activity -Continue seizure precautions -Management of rest of comorbidities per primary team   CRITICAL CARE Performed by: Lora Havens    Total critical care time: 30 minutes   Critical care time was exclusive of separately billable procedures and treating other patients.   Critical care was necessary to treat or prevent imminent or life-threatening deterioration.   Critical care was time spent personally by me on the following activities: development of treatment plan with patient and/or surrogate as well as nursing, discussions with consultants, evaluation of patient's response to treatment, examination of patient, obtaining history from patient or surrogate, ordering and performing treatments and interventions, ordering and review of laboratory studies, ordering and review of radiographic studies, pulse oximetry and  re-evaluation of patient's condition.   Zeb Comfort Epilepsy [facility] For questions after 5pm please refer to AMION to reach the Neurologist on call

## 2020-11-18 NOTE — Progress Notes (Signed)
Inpatient Rehab Admissions Coordinator:   Pt. On ventilator, not medically appropriate for CIR. I will sign off. MD may reconsult  if Pt. Becomes appropriate.   Clemens Catholic, [place], [place] Admissions Coordinator  [phone] ([place]) [phone] (office)

## 2020-11-18 NOTE — Progress Notes (Signed)
Started cEEG study.  Notified [facility] monitoring.  Tested patient event button. 

## 2020-11-18 NOTE — Progress Notes (Signed)
ANTICOAGULATION CONSULT NOTE  Pharmacy Consult for heparin Indication: atrial fibrillation  Allergies  Allergen Reactions   Prednisone Anaphylaxis and Shortness Of Breath   Dulaglutide Nausea Only   Atorvastatin    Lovastatin     Other reaction(s): OTHER REACTION    Patient Measurements: Height: '5\' 10"'$  (177.8 cm) Weight: 118 kg (260 lb 2.3 oz) IBW/kg (Calculated) : 73 Heparin Dosing Weight: 99kg  Vital Signs: Temp: 97.3 F (36.3 C) (09/12 0930) Temp Source: Bladder (09/12 0800) BP: 138/57 (09/12 0936) Pulse Rate: 64 (09/12 0936)  Labs: Recent Labs    11/16/20 0224 11/16/20 0330 11/16/20 0438 11/16/20 0944 11/16/20 1420 11/16/20 1742 11/17/20 0402 11/17/20 1005 11/18/20 0522  HGB  --  9.5*   < > 17.0  --   --   --  10.4* 9.2*  HCT  --  28.4*   < > 50.0  --   --   --  32.1* 29.5*  PLT  --  222  --   --   --   --   --  212 183  APTT 132*  --   --   --  153* 120* 83*  --   --   HEPARINUNFRC 0.83*  --   --   --  0.75*  --  0.48 0.36 0.44  CREATININE 2.69*  --   --   --   --   --   --  2.80* 3.05*   < > = values in this interval not displayed.     Estimated Creatinine Clearance: 24.9 mL/min (A) (by C-G formula based on SCr of 3.05 mg/dL (H)).   Assessment: 18 yoM on apixaban PTA for hx AF admitted with gangrenous toe s/p L BKA. Apixaban resumed 9/8, then pt suffered PEA arrest. Pharmacy asked to transition back to IV Heparin. CT Head negative for bleeding. Last dose 9/8 apixaban~2100 9/9  Heparin level now down within range  and correlated with aptt use heparin level for heparin dosing after 9/11. Heparin drip 1450 uts/hr heparin level 0.44 at goal no bleeding noted and CBC stable .   Goal of Therapy:  Heparin level 0.3-0.7 units/ml aPTT 66-102 seconds Monitor platelets by anticoagulation protocol: Yes   Plan:  Continue Heparin at 1450 units/h  Follow-up daily Heparin level and CBC     Bonnita Nasuti Pharm.D. CPP, BCPS Clinical  Pharmacist [phone] 11/18/2020 10:30 AM

## 2020-11-18 NOTE — Procedures (Signed)
Patient Name: Patrick Shelton  MRN: [id number]  Epilepsy Attending: Lora Havens  Referring Physician/Provider: Hayden Pedro, NP Duration: 11/17/2020 0959 to 11/17/2020 1707   Patient history: 80 year old male with altered mental status and concern for anoxic brain injury noted to have abnormal movements concerning for myoclonus or seizure.  EEG to evaluate for seizures.   Level of alertness: comatose   AEDs during EEG study: Propofol, LEV   Technical aspects: This EEG study was done with scalp electrodes positioned according to the 10-20 International system of electrode placement. Electrical activity was acquired at a sampling rate of '500Hz'$  and reviewed with a high frequency filter of '70Hz'$  and a low frequency filter of '1Hz'$ . EEG data were recorded continuously and digitally stored.    Description: EEG initially showed continuous generalized low amplitude 3-'5Hz'$  theta-delta slowing which gradually evolved into continuous generalized background attenuation.  Event button was pressed on 11/17/2020 at 1022.  Per RN patient had repetitive blinking.  Concomitant EEG before, during and after the event did not show any EEG change to suggest seizure.  Of note, parts of study were difficult to interpret due to significant electrode artifact.   ABNORMALITY - Continuous slow, generalized -Background attenuation, generalized   IMPRESSION: This study was suggestive of severe to profound diffuse encephalopathy. This eeg pattern is non specific in etiology but could be secondary to sedation, anoxic-hypoxic brain injury in setting of cardiac arrest.   Patient event button was pressed on 11/17/2020 at 1022 during which patient had repetitive blinking without concomitant EEG change.  This was likely not an epileptic event.  Differential would include myoclonus. No definite seizures were seen during the study.    Randal Goens [name]

## 2020-11-18 NOTE — Progress Notes (Signed)
NAME:  Patrick Shelton, MRN:  [id number], DOB:  Nov 25, 1940, LOS: 37 ADMISSION DATE:  10/27/2020, CONSULTATION DATE:  11/15/2020 REFERRING MD:  Dr. 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 Vascular consulted. 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 Stay complicated by acute on chronic renal failure, hyperkalemia, encephalopathy, and respiratory distress.   9/8 patient with hypoxia and labored breathing with productive cough with white clear frothy sputum. Given lasix. 9/9 patient found unresponsive and pulseless. PEA. Required 15 minutes of CPR, 1 Calcium, 4 EPI, and 1 sodium Bicarb prior to ROSC. On arrival to ICU patient is hypotensive, bradycardiac, intubated, and unresponsive.   Pertinent  Medical History  DM, HTN, HLD, PAD, A.Fib on Eliquis, s/p Right BKA, now Left BKA   Significant Hospital Events: Including procedures, antibiotic start and stop dates in addition to other pertinent events   9/1 Presents to ED with suspected osteo of 4th toe to LLE  9/6 L BKA 9/9 early am cardiac arrest, prolonged, intubated, on pressors  Interim History / Subjective:   9/12: no overnight events noted. LTM discontinued yesterday. Aed per neuro. Agree with stopping propofol and monitor for myoclonus/sz like activity. Family would like to await decision making until daughter returns back from trip to [place] (day 4 of 14 as of 9/12) 9/11:Hypertensive overnight, sometimes with stimulation. No seizure activity.   Objective   Blood pressure (!) 128/47, pulse 67, temperature 98.1 F (36.7 C), resp. rate 19, height '5\' 10"'$  (1.778 m), weight 118 kg, SpO2 94 %.    Vent Mode: PRVC FiO2 (%):  [40 %] 40 % Set Rate:  [18 bmp] 18 bmp Vt Set:  [580 mL] 580 mL PEEP:  [5 cmH20] 5 cmH20 Plateau Pressure:  [20 cmH20-26 cmH20] 26 cmH20   Intake/Output Summary (Last 24 hours) at 11/18/2020 0829 Last data filed at  11/18/2020 0700 Gross per 24 hour  Intake 2591.14 ml  Output 1405 ml  Net 1186.14 ml   Filed Weights   11/15/20 1845 11/17/20 0500 11/18/20 0500  Weight: 121.3 kg 121.7 kg 118 kg    Examination: General: Critically ill-appearing man lying in bed in no acute distress intubated, sedated on propofol HEENT: [place]/AT, eyes anicteric, endotracheal tube in place, pupils 82m and reactive.  Neuro: Examined on propofol No observed myoclonus.  No pharyngeal gag, but strong cough reflex.  Withdrawing from pain in bilateral lower extremities and left upper extremity, not right upper extremity.  No response to verbal stimulation.  Breathing over the vent. CV: Regular rate and rhythm with occasional PVCs PULM:  Reduced basilar breath sounds R. Otherwise rhonchi bilaterally GI: Obese, soft, nontender, nondistended Extremities: Bilateral BKA's, L with wrap in place) no peripheral edema or cyanosis Skin: No rashes, skin warm and dry.   BMP with worsening CR >3, hypernatremia but improving lft Respiratory culture-rare GPCs Blood cultures-no growth to date  Resolved Hospital Problem list     Assessment & Plan:   Acute encephalopathy, concern for anoxic injury.  Myoclonic seizure activity Plan -Appreciate neurology's assistance.  Continue Keppra.  -stop propofol - cEEG discontinued - Continue neuroprotective measures and avoid fevers -Continue seizure precautions  PEA cardiac arrest, unclear etiology, likely respiratory related given above recent events and increasing resp distress 9/8, less likely PE given patient has been on heparin gtt then transitioned to eliquis.  Has had evidence of elevated BNP and signs of  edema on CXR and failed his SLP on 9/8, with some concern for esophageal issues/ obstruction, but OGT was placed successfully.      - Continue supportive care measures - Goal normothermia, normal electrolytes, euglycemia  Hypertension -Labetalol as needed. -Con't to hold metoprolol 25  mg bid, norvasc '5mg'$ . If he remains hypertensive and can stay off sedation, can resume these. - If maintaining off sedation, can add longer acting medications  Shock, suspect related to sedating medications> resolved -echo normal LVEF 60-65% no RH dysfunction  A.Fib - previous TTE in 05/2020 with normal EF, G1DD. Repeat echo pending. - Con't to hold lopressor 25 mg bid, norvasc '5mg'$ . If he remains hypertensive and can stay off sedation, can resume these. -Heparin rather than eliquis given potential need for procedures in ICU. Can transition to lovenox if renal function improves. - Tele monitoring - BMP pending  Acute hypoxic respiratory failure requiring MV Bilateral lower lobe pneumonia, likely due to aspiration  Likely component of acute pulmonary edema previously -LTVV, 4-8cc/kg IBW with goal Pplat<30 and DP<15 -decreasing sedation for myoclonus suppression today; PAD protocol -VAP prevention protocol - Daily SAT and SBT-failed SBT due to tachypnea today, but was performed before sedation could be weaned.  Mental status currently precludes extubation.  We will continue to assess -Wean FiO2 as able to maintain SPO2 greater than 90%. -Continue to follow respiratory cultures.  Continue Zosyn.   -5 days therapy - SLP eval 9/8 questioned some esophogeal obstruction & recommended esophagram.  Further workup pending neuro recovery.   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 -S/P Rocephin/Flagyl/Vancomycin for 7 days completed 9/7 Plan -Continue to follow blood cultures from 9/9 until finalized - Appreciate vascular surgery's management  Anion Gap Metabolic Acidosis, due to lactic acidosis> resolved Acute on Chronic Renal Failure 2/2 to hypoperfusion associated with cardiac arrest Oliguria improved-1.2 L of urine output in the past 24 hours Hyperkalemia resolved Mild hyperchloremia Plan -cr worsening, uop remains adequate however -Strict I's/O - Renally dose meds and avoid  nephrotoxic medications - Continue Foley catheter - Maintain adequate renal perfusion  Elevated LFT, suspect secondary to hypoperfusion/ shock-improving Hyperammonemia, resolved -No additional monitoring required  Hyperglycemia  DM; A1c 7.2 -Increase long-acting insulin to 20 units twice daily - Tube feeding coverage 2 units every 4h with hold parameters - Sliding scale insulin as needed - Goal blood glucose 140-180 while in the ICU  Hypophosphatemia, resolved Hypomagnesemia, resolved  Chronic normocytic anemia -Transfuse for hemoglobin less than 7 or hemodynamically significant bleeding  Will update wife at bedside later today.  She has been updated the last 2 days.  Daughter is in [place] for 2 weeks. (Day 4 of 14 on 9.12)  Best Practice (right click and "Reselect all SmartList Selections" daily)   Diet/type: tubefeeds DVT prophylaxis: systemic heparin GI prophylaxis: PPI Lines: Central line Foley:  Yes, and it is still needed Code Status:  DNR Last date of multidisciplinary goals of care discussion: 9/12 with wife  Labs   CBC: Recent Labs  Lab 11/15/20 0102 11/15/20 0135 11/15/20 0206 11/16/20 0330 11/16/20 0438 11/16/20 0944 11/17/20 1005 11/18/20 0522  WBC 26.0* 23.3*  --  18.2*  --   --  18.6* 14.8*  NEUTROABS 17.1*  --   --   --   --   --   --   --   HGB 11.3* 10.9*   < > 9.5* 10.5* 17.0 10.4* 9.2*  HCT 36.9* 35.6*   < > 28.4* 31.0* 50.0 32.1*  29.5*  MCV 99.5 98.3  --  89.0  --   --  93.0 94.2  PLT 267 250  --  222  --   --  212 183   < > = values in this interval not displayed.    Basic Metabolic Panel: Recent Labs  Lab 11/15/20 0102 11/15/20 0135 11/15/20 0206 11/15/20 0207 11/15/20 0902 11/15/20 [id number] 11/16/20 0224 11/16/20 [id number] 11/16/20 0944 11/17/20 0402 11/17/20 1005 11/18/20 0522  NA 143 143   < > 145 142   < > 144 151* 150*  --  143 147*  K 5.4* 5.2*   < > 5.0 5.0   < > 3.7 3.7 3.8  --  4.5 4.1  CL 109 108  --  113* 112*  --  114*  --    --   --  112* 117*  CO2 20* 19*  --   --  20*  --  22  --   --   --  23 23  GLUCOSE 321* 333*  --  358* 423*  --  194*  --   --   --  460* 306*  BUN 52* 52*  --  50* 61*  --  63*  --   --   --  68* 67*  CREATININE 2.15* 2.22*  --  2.00* 2.60*  --  2.69*  --   --   --  2.80* 3.05*  CALCIUM 9.3 9.2  --   --  8.6*  --  8.6*  --   --   --  8.1* 8.3*  MG 2.0  --   --   --   --   --  1.6*  --   --  2.0  --  2.1  PHOS  --   --   --   --  3.7  --  1.6*  --   --  4.1  --  4.5   < > = values in this interval not displayed.   GFR: Estimated Creatinine Clearance: 24.9 mL/min (A) (by C-G formula based on SCr of 3.05 mg/dL (H)). Recent Labs  Lab 11/15/20 0102 11/15/20 0135 11/15/20 0536 11/15/20 0859 11/16/20 0330 11/17/20 1005 11/18/20 0522  WBC 26.0* 23.3*  --   --  18.2* 18.6* 14.8*  LATICACIDVEN 6.1*  --  4.5* 3.4*  --   --   --     Liver Function Tests: Recent Labs  Lab 11/15/20 0102 11/15/20 0902 11/16/20 0224 11/18/20 0522  AST 918*  --  298* 42*  ALT 452*  --  414* 173*  ALKPHOS 95  --  66 79  BILITOT 0.6  --  0.9 0.5  PROT 6.5  --  5.5* 5.7*  ALBUMIN 2.2* 2.1* 1.8* 1.6*   No results for input(s): LIPASE, AMYLASE in the last 168 hours. Recent Labs  Lab 11/15/20 0131 11/16/20 0224  AMMONIA 68* 25    ABG    Component Value Date/Time   PHART 7.408 11/16/2020 0944   PCO2ART 34.7 11/16/2020 0944   PO2ART 89 11/16/2020 0944   HCO3 22.0 11/16/2020 0944   TCO2 23 11/16/2020 0944   ACIDBASEDEF 2.0 11/16/2020 0944   O2SAT 97.0 11/16/2020 0944       This patient is critically ill with multiple organ system failure which requires frequent high complexity decision making, assessment, support, evaluation, and titration of therapies. This was completed through the application of advanced monitoring technologies and extensive interpretation of multiple databases. During this encounter critical  care time was devoted to patient care services described in this note for  56mnutes.   JAudria Nine DO 11/18/20 8:29 AM  Pulmonary & Critical Care

## 2020-11-18 NOTE — Progress Notes (Signed)
LTM maint complete - no skin breakdown .  [facility] monitored, Event button test confirmed by [facility].

## 2020-11-19 DIAGNOSIS — L03116 Cellulitis of left lower limb: Secondary | ICD-10-CM | POA: Diagnosis not present

## 2020-11-19 DIAGNOSIS — I469 Cardiac arrest, cause unspecified: Secondary | ICD-10-CM | POA: Diagnosis not present

## 2020-11-19 LAB — BASIC METABOLIC PANEL
Anion gap: 9 (ref 5–15)
BUN: 68 mg/dL — ABNORMAL HIGH (ref 8–23)
CO2: 22 mmol/L (ref 22–32)
Calcium: 8.3 mg/dL — ABNORMAL LOW (ref 8.9–10.3)
Chloride: 117 mmol/L — ABNORMAL HIGH (ref 98–111)
Creatinine, Ser: 2.95 mg/dL — ABNORMAL HIGH (ref 0.61–1.24)
GFR, Estimated: 21 mL/min — ABNORMAL LOW (ref >60)
Glucose, Bld: 215 mg/dL — ABNORMAL HIGH (ref 70–99)
Potassium: 4.4 mmol/L (ref 3.5–5.1)
Sodium: 148 mmol/L — ABNORMAL HIGH (ref 135–145)

## 2020-11-19 LAB — CBC
HCT: 32.8 % — ABNORMAL LOW (ref 39.0–52.0)
Hemoglobin: 10.8 g/dL — ABNORMAL LOW (ref 13.0–17.0)
MCH: 30.4 pg (ref 26.0–34.0)
MCHC: 32.9 g/dL (ref 30.0–36.0)
MCV: 92.4 fL (ref 80.0–100.0)
Platelets: 182 10*3/uL (ref 150–400)
RBC: 3.55 MIL/uL — ABNORMAL LOW (ref 4.22–5.81)
RDW: 15.3 % (ref 11.5–15.5)
WBC: 17.6 10*3/uL — ABNORMAL HIGH (ref 4.0–10.5)
nRBC: 0.1 % (ref 0.0–0.2)

## 2020-11-19 LAB — GLUCOSE, CAPILLARY
Glucose-Capillary: 133 mg/dL — ABNORMAL HIGH (ref 70–99)
Glucose-Capillary: 147 mg/dL — ABNORMAL HIGH (ref 70–99)
Glucose-Capillary: 148 mg/dL — ABNORMAL HIGH (ref 70–99)
Glucose-Capillary: 160 mg/dL — ABNORMAL HIGH (ref 70–99)
Glucose-Capillary: 161 mg/dL — ABNORMAL HIGH (ref 70–99)
Glucose-Capillary: 165 mg/dL — ABNORMAL HIGH (ref 70–99)
Glucose-Capillary: 167 mg/dL — ABNORMAL HIGH (ref 70–99)
Glucose-Capillary: 167 mg/dL — ABNORMAL HIGH (ref 70–99)
Glucose-Capillary: 167 mg/dL — ABNORMAL HIGH (ref 70–99)
Glucose-Capillary: 168 mg/dL — ABNORMAL HIGH (ref 70–99)
Glucose-Capillary: 169 mg/dL — ABNORMAL HIGH (ref 70–99)
Glucose-Capillary: 172 mg/dL — ABNORMAL HIGH (ref 70–99)
Glucose-Capillary: 172 mg/dL — ABNORMAL HIGH (ref 70–99)
Glucose-Capillary: 174 mg/dL — ABNORMAL HIGH (ref 70–99)
Glucose-Capillary: 178 mg/dL — ABNORMAL HIGH (ref 70–99)
Glucose-Capillary: 183 mg/dL — ABNORMAL HIGH (ref 70–99)
Glucose-Capillary: 195 mg/dL — ABNORMAL HIGH (ref 70–99)
Glucose-Capillary: 209 mg/dL — ABNORMAL HIGH (ref 70–99)

## 2020-11-19 LAB — MAGNESIUM: Magnesium: 2 mg/dL (ref 1.7–2.4)

## 2020-11-19 LAB — PHOSPHORUS: Phosphorus: 3.9 mg/dL (ref 2.5–4.6)

## 2020-11-19 LAB — TRIGLYCERIDES: Triglycerides: 176 mg/dL — ABNORMAL HIGH (ref <150)

## 2020-11-19 LAB — HEPARIN LEVEL (UNFRACTIONATED): Heparin Unfractionated: 0.45 IU/mL (ref 0.30–0.70)

## 2020-11-19 MED ORDER — AMLODIPINE BESYLATE 5 MG PO TABS: 5.0000 mg | ORAL_TABLET | Freq: Every day | ORAL | Status: DC

## 2020-11-19 MED ORDER — AMLODIPINE BESYLATE 5 MG PO TABS
5.0000 mg | ORAL_TABLET | Freq: Every day | ORAL | Status: DC
  Administered 2020-11-20 – 2020-11-24 (×5): 5 mg
  Filled 2020-11-19 (×5): qty 1

## 2020-11-19 MED ORDER — BETHANECHOL CHLORIDE 10 MG PO TABS
10.0000 mg | ORAL_TABLET | Freq: Three times a day (TID) | ORAL | Status: DC
  Administered 2020-11-19 – 2020-11-27 (×27): 10 mg
  Filled 2020-11-19 (×27): qty 1

## 2020-11-19 MED ORDER — INFLUENZA VAC A&B SA ADJ QUAD 0.5 ML IM PRSY
0.5000 mL | PREFILLED_SYRINGE | INTRAMUSCULAR | Status: DC
  Filled 2020-11-19: qty 0.5

## 2020-11-19 MED ORDER — METOPROLOL TARTRATE 12.5 MG HALF TABLET
12.5000 mg | ORAL_TABLET | Freq: Two times a day (BID) | ORAL | Status: DC
  Administered 2020-11-19 – 2020-11-21 (×4): 12.5 mg
  Filled 2020-11-19 (×6): qty 1

## 2020-11-19 NOTE — Progress Notes (Signed)
ANTICOAGULATION CONSULT NOTE  Pharmacy Consult for heparin Indication: atrial fibrillation  Allergies  Allergen Reactions   Prednisone Anaphylaxis and Shortness Of Breath   Dulaglutide Nausea Only   Atorvastatin    Lovastatin     Other reaction(s): OTHER REACTION    Patient Measurements: Height: '5\' 10"'$  (177.8 cm) Weight: 120.3 kg (265 lb 3.4 oz) IBW/kg (Calculated) : 73 Heparin Dosing Weight: 99kg  Vital Signs: Temp: 98.6 F (37 C) (09/13 0700) Temp Source: Bladder (09/13 0400) BP: 139/55 (09/13 0838) Pulse Rate: 84 (09/13 0838)  Labs: Recent Labs    11/16/20 1420 11/16/20 1742 11/17/20 0402 11/17/20 0402 11/17/20 1005 11/18/20 0522 11/19/20 0302 11/19/20 0920  HGB  --   --   --    < > 10.4* 9.2* 10.8*  --   HCT  --   --   --   --  32.1* 29.5* 32.8*  --   PLT  --   --   --   --  212 183 182  --   APTT 153* 120* 83*  --   --   --   --   --   HEPARINUNFRC 0.75*  --  0.48  --  0.36 0.44 0.45  --   CREATININE  --   --   --   --  2.80* 3.05*  --  2.95*   < > = values in this interval not displayed.     Estimated Creatinine Clearance: 26 mL/min (A) (by C-G formula based on SCr of 2.95 mg/dL (H)).   Assessment: 31 yoM on apixaban PTA for hx AF admitted with gangrenous toe s/p L BKA. Apixaban resumed 9/8, then pt suffered PEA arrest. Pharmacy asked to transition back to IV Heparin. CT Head negative for bleeding. Last dose 9/8 apixaban~2100 9/9  Heparin level within range  and correlated with aptt use heparin level for heparin dosing after 9/11. Heparin drip 1450 uts/hr heparin level 0.45 at goal no bleeding noted and CBC stable .   Goal of Therapy:  Heparin level 0.3-0.7 units/ml aPTT 66-102 seconds Monitor platelets by anticoagulation protocol: Yes   Plan:  Continue Heparin at 1450 units/h  Follow-up daily Heparin level and CBC     Bonnita Nasuti Pharm.D. CPP, BCPS Clinical Pharmacist [phone] 11/19/2020 2:15 PM

## 2020-11-19 NOTE — Procedures (Addendum)
Patient Name: Ram Creviston  MRN: [id number]  Epilepsy Attending: Lora Havens  Referring Physician/Provider: Hayden Pedro, NP Duration: 11/18/2020 1131 to 11/19/2020 0935   Patient history: 80 year old male with altered mental status and concern for anoxic brain injury noted to have abnormal movements concerning for myoclonus or seizure.  EEG to evaluate for seizures.   Level of alertness: comatose   AEDs during EEG study: LEV   Technical aspects: This EEG study was done with scalp electrodes positioned according to the 10-20 International system of electrode placement. Electrical activity was acquired at a sampling rate of '500Hz'$  and reviewed with a high frequency filter of '70Hz'$  and a low frequency filter of '1Hz'$ . EEG data were recorded continuously and digitally stored.    Description: EEG showed continuous generalized background attenuation.  EEG was not reactive to tactile and noxious stimulation.  Of note, ventilator artifact was seen during parts of study.    ABNORMALITY -Background attenuation, generalized   IMPRESSION: This study was suggestive of profound diffuse encephalopathy. No seizures or definite epileptiform discharges were seen during the study. This eeg pattern is non specific in etiology but could be secondary to sedation, anoxic-hypoxic brain injury in setting of cardiac arrest.     Khambrel Amsden [name]

## 2020-11-19 NOTE — Progress Notes (Signed)
Subjective: NAEO. Has eye fluttering intermittent.  Has been on fentanyl overnight  ROS: Unable to obtain due to poor mental status  Examination  Vital signs in last 24 hours: Temp:  [97 F (36.1 C)-99.1 F (37.3 C)] 98.6 F (37 C) (09/13 0700) Pulse Rate:  [64-86] 86 (09/13 0700) Resp:  [14-37] 14 (09/13 0500) BP: (134-196)/(41-83) 143/68 (09/13 0700) SpO2:  [93 %-100 %] 98 % (09/13 0700) Arterial Line BP: (142-284)/(42-273) 175/55 (09/13 0700) FiO2 (%):  [40 %] 40 % (09/13 0400) Weight:  [120.3 kg] 120.3 kg (09/13 0400)  General: lying in bed, NAD CVS: pulse-normal rate and rhythm RS: CTAB, intubated Extremities: warm, +edema Neuro: pupils reactive to light, corneal reflex absent, gag reflex present, doesn't withdraw to noxious stimuli in all extremities, bilateral lower extremity BKA.  Patient had intermittent eye fluttering without EEG correlate   Basic Metabolic Panel: Recent Labs  Lab 11/15/20 0102 11/15/20 0135 11/15/20 0206 11/15/20 0207 11/15/20 0902 11/15/20 [id number] 11/16/20 0224 11/16/20 [id number] 11/16/20 0944 11/17/20 0402 11/17/20 1005 11/18/20 0522 11/19/20 0302  NA 143 143   < > 145 142   < > 144 151* 150*  --  143 147*  --   K 5.4* 5.2*   < > 5.0 5.0   < > 3.7 3.7 3.8  --  4.5 4.1  --   CL 109 108  --  113* 112*  --  114*  --   --   --  112* 117*  --   CO2 20* 19*  --   --  20*  --  22  --   --   --  23 23  --   GLUCOSE 321* 333*  --  358* 423*  --  194*  --   --   --  460* 306*  --   BUN 52* 52*  --  50* 61*  --  63*  --   --   --  68* 67*  --   CREATININE 2.15* 2.22*  --  2.00* 2.60*  --  2.69*  --   --   --  2.80* 3.05*  --   CALCIUM 9.3 9.2  --   --  8.6*  --  8.6*  --   --   --  8.1* 8.3*  --   MG 2.0  --   --   --   --   --  1.6*  --   --  2.0  --  2.1 2.0  PHOS  --   --   --   --  3.7  --  1.6*  --   --  4.1  --  4.5 3.9   < > = values in this interval not displayed.    CBC: Recent Labs  Lab 11/15/20 0102 11/15/20 0135 11/15/20 0206  11/16/20 0330 11/16/20 0438 11/16/20 0944 11/17/20 1005 11/18/20 0522 11/19/20 0302  WBC 26.0* 23.3*  --  18.2*  --   --  18.6* 14.8* 17.6*  NEUTROABS 17.1*  --   --   --   --   --   --   --   --   HGB 11.3* 10.9*   < > 9.5* 10.5* 17.0 10.4* 9.2* 10.8*  HCT 36.9* 35.6*   < > 28.4* 31.0* 50.0 32.1* 29.5* 32.8*  MCV 99.5 98.3  --  89.0  --   --  93.0 94.2 92.4  PLT 267 250  --  222  --   --  212 183 182   < > = values in this interval not displayed.     Coagulation Studies: No results for input(s): LABPROT, INR in the last 72 hours.  Imaging No new imaging  ASSESSMENT AND PLAN: 80 year old male status post cardiac arrest requiring 15 minutes of CPR prior to ROSC and subsequently noted to have myoclonic seizures.   Cardiac arrest Anoxic encephalopathy Eyelid Myoclonus AKI on CKD Diabetes - LTM  was suggestive of profound diffuse encephalopathy. No seizures or definite epileptiform discharges were seen during the study. This eeg pattern is non specific in etiology but could be secondary to sedation, anoxic-hypoxic brain injury in setting of cardiac arrest.  -Eye fluttering is most likely myoclonus due to cardiac arrest  Recommendations: -Continue Keppra 500 mg twice daily (renally adjusted) -Can DC LTM EEG as no seizures overnight -Okay to use as needed sedation to keep patient comfortable -Patients' exam is worsening.  He does have cranial nerve reflexes but does not withdraw to noxious stimuli anymore.  His EEG shows continuous background suppression.  MRI also showed diffuse anoxic injury.  I discussed with patient's wife at bedside that I suspect patient has suffered significant irreversible neurological brain injury with minimal to no chances of meaningful brain recovery.  Wife would like to keep patient comfortable and stable till patient's daughter can return from [place]. I will communicate this with ICU team.  I did reiterate the fact that there is still a possibility that  patient could suffer another cardiac arrest.  Patient's wife understands this and agrees with the DNR status. -Updated critical care Dr Ruthann Cancer  -As needed IV Versed 4 mg for clinical seizure-like activity -Continue seizure precautions -Management of rest of comorbidities per primary team  Thank you for allowing [place] to participate in the care of this patient.  Neurology will sign off.  Please call us for any further questions.   I have spent a total of   40 minutes with the patient reviewing hospital notes,  test results, labs and examining the patient as well as establishing an assessment and plan that was discussed personally with the patient's family, RN and Dr Ruthann Cancer.  > 50% of time was spent in direct patient care.  Zeb Comfort Epilepsy [facility] For questions after 5pm please refer to AMION to reach the Neurologist on call

## 2020-11-19 NOTE — Progress Notes (Signed)
LTM EEG disconnected - no skin breakdown at unhook.  

## 2020-11-19 NOTE — Progress Notes (Signed)
NAME:  Patrick Shelton, MRN:  [id number], DOB:  [birth year], LOS: 12 ADMISSION DATE:  11/04/2020, CONSULTATION DATE:  11/15/2020 REFERRING MD:  Dr. 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 Vascular consulted. 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 Stay complicated by acute on chronic renal failure, hyperkalemia, encephalopathy, and respiratory distress.   9/8 patient with hypoxia and labored breathing with productive cough with white clear frothy sputum. Given lasix. 9/9 patient found unresponsive and pulseless. PEA. Required 15 minutes of CPR, 1 Calcium, 4 EPI, and 1 sodium Bicarb prior to ROSC. On arrival to ICU patient is hypotensive, bradycardiac, intubated, and unresponsive.   Pertinent  Medical History  DM, HTN, HLD, PAD, A.Fib on Eliquis, s/p Right BKA, now Left BKA   Significant Hospital Events: Including procedures, antibiotic start and stop dates in addition to other pertinent events   9/1 Presents to ED with suspected osteo of 4th toe to LLE  9/6 L BKA 9/9 early am cardiac arrest, prolonged, intubated, on pressors  Interim History / Subjective:  9/13: propofol stopped yesterday. Added low dose fentanyl for comfort. Eeg placed and despite sedation wean and pt eyes opening neuro reports flat with poor prognosis for meaningful recovery.  9/12: no overnight events noted. LTM discontinued yesterday. Aed per neuro. Agree with stopping propofol and monitor for myoclonus/sz like activity. Family would like to await decision making until daughter returns back from trip to [place] (day 4 of 14 as of 9/12) 9/11:Hypertensive overnight, sometimes with stimulation. No seizure activity.   Objective   Blood pressure (!) 143/68, pulse 86, temperature 98.6 F (37 C), resp. rate 14, height '5\' 10"'$  (1.778 m), weight 120.3 kg, SpO2 98 %.    Vent Mode: PRVC FiO2 (%):  [40 %] 40 % Set Rate:  [18  bmp] 18 bmp Vt Set:  [580 mL] 580 mL PEEP:  [5 cmH20] 5 cmH20 Plateau Pressure:  [20 cmH20-26 cmH20] 25 cmH20   Intake/Output Summary (Last 24 hours) at 11/19/2020 0825 Last data filed at 11/19/2020 0700 Gross per 24 hour  Intake 2333.28 ml  Output 1943 ml  Net 390.28 ml   Filed Weights   11/17/20 0500 11/18/20 0500 11/19/20 0400  Weight: 121.7 kg 118 kg 120.3 kg    Examination: General: Critically ill-appearing man lying in bed in no acute distress intubated, lightly sedated on 25 mcg fentanyl HEENT: /AT, eyes anicteric, scleral edema, eyelids with twitching. endotracheal tube in place, pupils 4m and reactive.  Neuro: Examined on propofol No observed myoclonus.  No pharyngeal gag, but strong cough reflex.  No withddrawal to pain.  No response to verbal stimulation.  Breathing over the vent. CV: Regular rate and rhythm with occasional PVCs PULM:  clear bilaterally GI: Obese, soft, nontender, nondistended Extremities: Bilateral BKA's, L with wrap in place) no peripheral edema or cyanosis Skin: No rashes, skin warm and dry.   BMP pending Respiratory culture-rare GPCs Blood cultures-no growth to date  Resolved Hospital Problem list     Assessment & Plan:   Acute encephalopathy, concern for anoxic injury.  Myoclonic seizure activity Plan -Appreciate neurology's assistance.  Continue Keppra.  -eeg poor reactivity with sedation off.  - Continue neuroprotective measures and avoid fevers -Continue seizure precautions  PEA cardiac arrest, unclear etiology, likely respiratory related given above recent events and increasing resp distress 9/8, less likely PE given patient has been on heparin  gtt then transitioned to eliquis.  Has had evidence of elevated BNP and signs of edema on CXR and failed his SLP on 9/8, with some concern for esophageal issues/ obstruction, but OGT was placed successfully.      - Continue supportive care measures - Goal normothermia, normal electrolytes,  euglycemia  Hypertension -Labetalol as needed. -resume amoldipine and metoprolol 12.5 - If maintaining off sedation, can add longer acting medications  Shock, suspect related to sedating medications> resolved -echo normal LVEF 60-65% no RH dysfunction  A.Fib - previous TTE in 05/2020 with normal EF, G1DD. -Heparin rather than eliquis given potential need for procedures in ICU. Can transition to lovenox if renal function improves. - Tele monitoring - BMP pending  Acute hypoxic respiratory failure requiring MV Bilateral lower lobe pneumonia, likely due to aspiration  Likely component of acute pulmonary edema previously -LTVV, 4-8cc/kg IBW with goal Pplat<30 and DP<15 -decreasing sedation for myoclonus suppression today; PAD protocol -VAP prevention protocol - Daily SAT and SBT-failed SBT due to tachypnea today, but was performed before sedation could be weaned.  Mental status currently precludes extubation.  We will continue to assess -Wean FiO2 as able to maintain SPO2 greater than 90%. -Continue to follow respiratory cultures.  Continue Zosyn.   -5 days therapy - SLP eval 9/8 questioned some esophogeal obstruction & recommended esophagram.  Further workup pending neuro recovery.   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 -S/P Rocephin/Flagyl/Vancomycin for 7 days completed 9/7 Plan -Continue to follow blood cultures from 9/9 until finalized - Appreciate vascular surgery's management  Anion Gap Metabolic Acidosis, due to lactic acidosis> resolved Acute on Chronic Renal Failure 2/2 to hypoperfusion associated with cardiac arrest Oliguria improved-1.2 L of urine output in the past 24 hours Hyperkalemia resolved Mild hyperchloremia Plan -cr worsening, uop remains adequate however -Strict I's/O - Renally dose meds and avoid nephrotoxic medications - Continue Foley catheter - Maintain adequate renal perfusion  Elevated LFT, suspect secondary to hypoperfusion/  shock-improving Hyperammonemia, resolved -No additional monitoring required  Hyperglycemia  DM; A1c 7.2 -BS elevated in 300-400 -placed on insulin infusion - Goal blood glucose 140-180 while in the ICU  Hypophosphatemia, resolved Hypomagnesemia, resolved  Chronic normocytic anemia -Transfuse for hemoglobin less than 7 or hemodynamically significant bleeding  Will update wife at bedside later today.  She has been updated the last 2 days.  Daughter is in [place] for 2 weeks. (Day 4 of 14 on 9.12)  Best Practice (right click and "Reselect all SmartList Selections" daily)   Diet/type: tubefeeds DVT prophylaxis: systemic heparin GI prophylaxis: PPI Lines: Central line Foley:  Yes, and it is still needed Code Status:  DNR Last date of multidisciplinary goals of care discussion: 9/12 with wife  Labs   CBC: Recent Labs  Lab 11/15/20 0102 11/15/20 0135 11/15/20 0206 11/16/20 0330 11/16/20 0438 11/16/20 0944 11/17/20 1005 11/18/20 0522 11/19/20 0302  WBC 26.0* 23.3*  --  18.2*  --   --  18.6* 14.8* 17.6*  NEUTROABS 17.1*  --   --   --   --   --   --   --   --   HGB 11.3* 10.9*   < > 9.5* 10.5* 17.0 10.4* 9.2* 10.8*  HCT 36.9* 35.6*   < > 28.4* 31.0* 50.0 32.1* 29.5* 32.8*  MCV 99.5 98.3  --  89.0  --   --  93.0 94.2 92.4  PLT 267 250  --  222  --   --  212 183 182   < > =  values in this interval not displayed.    Basic Metabolic Panel: Recent Labs  Lab 11/15/20 0102 11/15/20 0135 11/15/20 0206 11/15/20 0207 11/15/20 0902 11/15/20 [id number] 11/16/20 0224 11/16/20 [id number] 11/16/20 0944 11/17/20 0402 11/17/20 1005 11/18/20 0522 11/19/20 0302  NA 143 143   < > 145 142   < > 144 151* 150*  --  143 147*  --   K 5.4* 5.2*   < > 5.0 5.0   < > 3.7 3.7 3.8  --  4.5 4.1  --   CL 109 108  --  113* 112*  --  114*  --   --   --  112* 117*  --   CO2 20* 19*  --   --  20*  --  22  --   --   --  23 23  --   GLUCOSE 321* 333*  --  358* 423*  --  194*  --   --   --  460* 306*  --    BUN 52* 52*  --  50* 61*  --  63*  --   --   --  68* 67*  --   CREATININE 2.15* 2.22*  --  2.00* 2.60*  --  2.69*  --   --   --  2.80* 3.05*  --   CALCIUM 9.3 9.2  --   --  8.6*  --  8.6*  --   --   --  8.1* 8.3*  --   MG 2.0  --   --   --   --   --  1.6*  --   --  2.0  --  2.1 2.0  PHOS  --   --   --   --  3.7  --  1.6*  --   --  4.1  --  4.5 3.9   < > = values in this interval not displayed.   GFR: Estimated Creatinine Clearance: 25.1 mL/min (A) (by C-G formula based on SCr of 3.05 mg/dL (H)). Recent Labs  Lab 11/15/20 0102 11/15/20 0135 11/15/20 0536 11/15/20 0859 11/16/20 0330 11/17/20 1005 11/18/20 0522 11/19/20 0302  WBC 26.0*   < >  --   --  18.2* 18.6* 14.8* 17.6*  LATICACIDVEN 6.1*  --  4.5* 3.4*  --   --   --   --    < > = values in this interval not displayed.    Liver Function Tests: Recent Labs  Lab 11/15/20 0102 11/15/20 0902 11/16/20 0224 11/18/20 0522  AST 918*  --  298* 42*  ALT 452*  --  414* 173*  ALKPHOS 95  --  66 79  BILITOT 0.6  --  0.9 0.5  PROT 6.5  --  5.5* 5.7*  ALBUMIN 2.2* 2.1* 1.8* 1.6*   No results for input(s): LIPASE, AMYLASE in the last 168 hours. Recent Labs  Lab 11/15/20 0131 11/16/20 0224  AMMONIA 68* 25    ABG    Component Value Date/Time   PHART 7.408 11/16/2020 0944   PCO2ART 34.7 11/16/2020 0944   PO2ART 89 11/16/2020 0944   HCO3 22.0 11/16/2020 0944   TCO2 23 11/16/2020 0944   ACIDBASEDEF 2.0 11/16/2020 0944   O2SAT 97.0 11/16/2020 0944       This patient is critically ill with multiple organ system failure which requires frequent high complexity decision making, assessment, support, evaluation, and titration of therapies. This was completed through the application of advanced monitoring technologies  and extensive interpretation of multiple databases. During this encounter critical care time was devoted to patient care services described in this note for 39mnutes.   JAudria Nine DO 11/19/20 8:25  AM [facility]

## 2020-11-20 DIAGNOSIS — L03116 Cellulitis of left lower limb: Secondary | ICD-10-CM | POA: Diagnosis not present

## 2020-11-20 LAB — POCT I-STAT 7, (LYTES, BLD GAS, ICA,H+H)
Acid-base deficit: 3 mmol/L — ABNORMAL HIGH (ref 0.0–2.0)
Bicarbonate: 22.7 mmol/L (ref 20.0–28.0)
Calcium, Ion: 1.26 mmol/L (ref 1.15–1.40)
HCT: 29 % — ABNORMAL LOW (ref 39.0–52.0)
Hemoglobin: 9.9 g/dL — ABNORMAL LOW (ref 13.0–17.0)
O2 Saturation: 98 %
Patient temperature: 99.4
Potassium: 4.6 mmol/L (ref 3.5–5.1)
Sodium: 154 mmol/L — ABNORMAL HIGH (ref 135–145)
TCO2: 24 mmol/L (ref 22–32)
pCO2 arterial: 42 mmHg (ref 32.0–48.0)
pH, Arterial: 7.342 — ABNORMAL LOW (ref 7.350–7.450)
pO2, Arterial: 109 mmHg — ABNORMAL HIGH (ref 83.0–108.0)

## 2020-11-20 LAB — BASIC METABOLIC PANEL
Anion gap: 13 (ref 5–15)
BUN: 67 mg/dL — ABNORMAL HIGH (ref 8–23)
CO2: 22 mmol/L (ref 22–32)
Calcium: 8.4 mg/dL — ABNORMAL LOW (ref 8.9–10.3)
Chloride: 114 mmol/L — ABNORMAL HIGH (ref 98–111)
Creatinine, Ser: 2.77 mg/dL — ABNORMAL HIGH (ref 0.61–1.24)
GFR, Estimated: 22 mL/min — ABNORMAL LOW (ref >60)
Glucose, Bld: 186 mg/dL — ABNORMAL HIGH (ref 70–99)
Potassium: 4.7 mmol/L (ref 3.5–5.1)
Sodium: 149 mmol/L — ABNORMAL HIGH (ref 135–145)

## 2020-11-20 LAB — HEPARIN LEVEL (UNFRACTIONATED)
Heparin Unfractionated: 0.27 IU/mL — ABNORMAL LOW (ref 0.30–0.70)
Heparin Unfractionated: 0.57 IU/mL (ref 0.30–0.70)

## 2020-11-20 LAB — CBC
HCT: 31.3 % — ABNORMAL LOW (ref 39.0–52.0)
Hemoglobin: 10.1 g/dL — ABNORMAL LOW (ref 13.0–17.0)
MCH: 30.2 pg (ref 26.0–34.0)
MCHC: 32.3 g/dL (ref 30.0–36.0)
MCV: 93.7 fL (ref 80.0–100.0)
Platelets: 174 10*3/uL (ref 150–400)
RBC: 3.34 MIL/uL — ABNORMAL LOW (ref 4.22–5.81)
RDW: 16.1 % — ABNORMAL HIGH (ref 11.5–15.5)
WBC: 17 10*3/uL — ABNORMAL HIGH (ref 4.0–10.5)
nRBC: 0.1 % (ref 0.0–0.2)

## 2020-11-20 LAB — GLUCOSE, CAPILLARY
Glucose-Capillary: 163 mg/dL — ABNORMAL HIGH (ref 70–99)
Glucose-Capillary: 170 mg/dL — ABNORMAL HIGH (ref 70–99)
Glucose-Capillary: 119 mg/dL — ABNORMAL HIGH (ref 70–99)
Glucose-Capillary: 114 mg/dL — ABNORMAL HIGH (ref 70–99)
Glucose-Capillary: 211 mg/dL — ABNORMAL HIGH (ref 70–99)
Glucose-Capillary: 177 mg/dL — ABNORMAL HIGH (ref 70–99)
Glucose-Capillary: 204 mg/dL — ABNORMAL HIGH (ref 70–99)
Glucose-Capillary: 190 mg/dL — ABNORMAL HIGH (ref 70–99)
Glucose-Capillary: 194 mg/dL — ABNORMAL HIGH (ref 70–99)
Glucose-Capillary: 192 mg/dL — ABNORMAL HIGH (ref 70–99)
Glucose-Capillary: 181 mg/dL — ABNORMAL HIGH (ref 70–99)
Glucose-Capillary: 272 mg/dL — ABNORMAL HIGH (ref 70–99)
Glucose-Capillary: 318 mg/dL — ABNORMAL HIGH (ref 70–99)
Glucose-Capillary: 255 mg/dL — ABNORMAL HIGH (ref 70–99)
Glucose-Capillary: 263 mg/dL — ABNORMAL HIGH (ref 70–99)

## 2020-11-20 LAB — CULTURE, BLOOD (ROUTINE X 2)
Culture: NO GROWTH
Culture: NO GROWTH

## 2020-11-20 LAB — PHOSPHORUS: Phosphorus: 4.1 mg/dL (ref 2.5–4.6)

## 2020-11-20 LAB — MAGNESIUM: Magnesium: 2.1 mg/dL (ref 1.7–2.4)

## 2020-11-20 MED ORDER — INSULIN REGULAR(HUMAN) IN NACL 100-0.9 UT/100ML-% IV SOLN
INTRAVENOUS | Status: DC
  Administered 2020-11-21: 6 [IU]/h via INTRAVENOUS
  Administered 2020-11-22: 8.5 [IU]/h via INTRAVENOUS
  Filled 2020-11-20 (×2): qty 100

## 2020-11-20 MED ORDER — INSULIN ASPART 100 UNIT/ML IJ SOLN
9.0000 [IU] | INTRAMUSCULAR | Status: DC
  Administered 2020-11-20: 9 [IU] via SUBCUTANEOUS

## 2020-11-20 MED ORDER — PROSOURCE TF PO LIQD
45.0000 mL | Freq: Three times a day (TID) | ORAL | Status: DC
  Administered 2020-11-20 – 2020-11-27 (×23): 45 mL
  Filled 2020-11-20 (×23): qty 45

## 2020-11-20 MED ORDER — DEXTROSE 50 % IV SOLN: 0.0000 mL | INTRAVENOUS | Status: DC | PRN

## 2020-11-20 MED ORDER — INSULIN ASPART 100 UNIT/ML IJ SOLN
2.0000 [IU] | INTRAMUSCULAR | Status: DC
  Administered 2020-11-20: 4 [IU] via SUBCUTANEOUS
  Administered 2020-11-20: 6 [IU] via SUBCUTANEOUS

## 2020-11-20 MED ORDER — DEXTROSE 10 % IV SOLN: INTRAVENOUS | Status: DC | PRN

## 2020-11-20 MED ORDER — NUTRISOURCE FIBER PO PACK
1.0000 | PACK | Freq: Two times a day (BID) | ORAL | Status: DC
  Administered 2020-11-20 – 2020-11-21 (×4): 1
  Filled 2020-11-20 (×6): qty 1

## 2020-11-20 MED ORDER — VITAL 1.5 CAL PO LIQD
1000.0000 mL | ORAL | Status: DC
  Administered 2020-11-20 – 2020-11-27 (×8): 1000 mL
  Filled 2020-11-20: qty 1000

## 2020-11-20 MED ORDER — INSULIN DETEMIR 100 UNIT/ML ~~LOC~~ SOLN
26.0000 [IU] | Freq: Two times a day (BID) | SUBCUTANEOUS | Status: DC
  Administered 2020-11-20: 26 [IU] via SUBCUTANEOUS
  Filled 2020-11-20 (×2): qty 0.26

## 2020-11-20 NOTE — Progress Notes (Signed)
Pt's ETT came out to 21 at the lip. RT advanced the airway back to the original 23 at the lip. No complications noted with the change.

## 2020-11-20 NOTE — Progress Notes (Signed)
ANTICOAGULATION CONSULT NOTE  Pharmacy Consult for heparin Indication: atrial fibrillation  Allergies  Allergen Reactions   Prednisone Anaphylaxis and Shortness Of Breath   Dulaglutide Nausea Only   Atorvastatin    Lovastatin     Other reaction(s): OTHER REACTION    Patient Measurements: Height: '5\' 10"'$  (177.8 cm) Weight: 123 kg (271 lb 2.7 oz) IBW/kg (Calculated) : 73 Heparin Dosing Weight: 99kg  Vital Signs: BP: 121/55 (09/14 1400) Pulse Rate: 54 (09/14 1400)  Labs: Recent Labs    11/18/20 0522 11/19/20 0302 11/19/20 0920 11/20/20 0255 11/20/20 1411  HGB 9.2* 10.8*  --  10.1*  --   HCT 29.5* 32.8*  --  31.3*  --   PLT 183 182  --  174  --   HEPARINUNFRC 0.44 0.45  --  0.27* 0.57  CREATININE 3.05*  --  2.95* 2.77*  --      Estimated Creatinine Clearance: 28 mL/min (A) (by C-G formula based on SCr of 2.77 mg/dL (H)).   Assessment: 70 yoM on apixaban PTA for hx AF admitted with gangrenous toe s/p L BKA. Apixaban resumed 9/8, then pt suffered PEA arrest. Pharmacy asked to transition back to IV Heparin. CT Head negative for bleeding. Last dose 9/8 apixaban~2100 9/9  Heparin level within range  and correlated with aptt use heparin level for heparin dosing after 9/11. Heparin drip 1450 uts/hr heparin level 0.57 at goal no bleeding noted and CBC stable .   Goal of Therapy:  Heparin level 0.3-0.7 units/ml aPTT 66-102 seconds Monitor platelets by anticoagulation protocol: Yes   Plan:  Continue Heparin at 1450 units/h  Follow-up daily Heparin level and CBC     Bonnita Nasuti Pharm.D. CPP, BCPS Clinical Pharmacist [phone] 11/20/2020 3:51 PM

## 2020-11-20 NOTE — Progress Notes (Signed)
Nutrition Follow-up  DOCUMENTATION CODES:   Obesity unspecified  INTERVENTION:   Recommend Cortrak placement Friday if aggressive measures continue  Transition tube feeding:  -Vital 1.5 @ 55 ml/hr via OG (1320 ml) -ProSource TF 45 ml TID  Provides: 2100 kcals, 122 grams protein, 1008 ml free water.   NUTRITION DIAGNOSIS:   Increased nutrient needs related to wound healing as evidenced by estimated needs.  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  9/01 Admitted 9/06 L BKA 9/09 Cardiac Arrest, Intubated, Shock requiring pressors  Mental status precludes extubation. Per neurology, concern that patient has suffered significant irreversible brain injury with minimal to no chance of meaningful brain recovery. Awaiting family return to decide on [place].   Plan to wean off insulin drip today. Tolerating tube feeding at goal rate but having ongoing loose stools. Transition to concentrated formula and add NutriSource fiber packets to promote bulking. Recommend discontinuing scheduled colace/miralax.   Admission weight: 117.9 kg Current weight: 123 kg   UOP: 960 ml x 24 hrs Stool: 750 ml x 24 hrs   Drips: insulin Medications: vitamin D, colace, SS novolog, levemir, miralax Labs: Na 149 (H) CBG 114-204 Cr 2.77- trending down    Diet Order:   Diet Order             Diet NPO time specified  Diet effective now                   EDUCATION NEEDS:   Education needs have been addressed  Skin:  Skin Assessment:  Skin Integrity Issues: Skin Integrity Issues:: Incisions Incisions: L BKA 9/6  Last BM:  9/14  Height:   Ht Readings from Last 1 Encounters:  11/15/20 '5\' 10"'$  (1.778 m)    Weight:   Wt Readings from Last 1 Encounters:  11/20/20 123 kg    BMI:  Body mass index is 38.91 kg/m.  Estimated Nutritional Needs:   Kcal:  1900-2100  Protein:  115-145 gm  Fluid:  > 1.9 L/day  Mariana Single MS, RD, LDN, CNSC Clinical Nutrition Pager listed in [place]

## 2020-11-20 NOTE — Progress Notes (Signed)
NAME:  Patrick Shelton, MRN:  [id number], DOB:  [birth year], LOS: 43 ADMISSION DATE:  10/11/2020, CONSULTATION DATE:  11/15/2020 REFERRING MD:  Dr. 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 Vascular consulted. 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 Stay complicated by acute on chronic renal failure, hyperkalemia, encephalopathy, and respiratory distress.   9/8 patient with hypoxia and labored breathing with productive cough with white clear frothy sputum. Given lasix. 9/9 patient found unresponsive and pulseless. PEA. Required 15 minutes of CPR, 1 Calcium, 4 EPI, and 1 sodium Bicarb prior to ROSC. On arrival to ICU patient is hypotensive, bradycardiac, intubated, and unresponsive.   Pertinent  Medical History  DM, HTN, HLD, PAD, A.Fib on Eliquis, s/p Right BKA, now Left BKA   Significant Hospital Events: Including procedures, antibiotic start and stop dates in addition to other pertinent events   9/1 Presents to ED with suspected osteo of 4th toe to LLE  9/6 L BKA 9/9 early am cardiac arrest, prolonged, intubated, on pressors  Interim History / Subjective:  9/14: remains unresponsive on vent. Able to tolerate sbt with increased PS however for short period. Renal function stable as is wbc. Able to transition to subcut insulin today.   9/13: propofol stopped yesterday. Added low dose fentanyl for comfort. Eeg placed and despite sedation wean and pt eyes opening neuro reports flat with poor prognosis for meaningful recovery.  9/12: no overnight events noted. LTM discontinued yesterday. Aed per neuro. Agree with stopping propofol and monitor for myoclonus/sz like activity. Family would like to await decision making until daughter returns back from trip to [place] (day 4 of 14 as of 9/12) 9/11:Hypertensive overnight, sometimes with stimulation. No seizure activity.   Objective   Blood pressure  (!) 200/63, pulse (!) 51, temperature 98.8 F (37.1 C), temperature source Axillary, resp. rate 14, height '5\' 10"'$  (1.778 m), weight 123 kg, SpO2 99 %.    Vent Mode: PSV;CPAP FiO2 (%):  [40 %] 40 % Set Rate:  [18 bmp] 18 bmp Vt Set:  [580 mL] 580 mL PEEP:  [5 cmH20] 5 cmH20 Pressure Support:  [10 cmH20] 10 cmH20 Plateau Pressure:  [20 cmH20-25 cmH20] 23 cmH20   Intake/Output Summary (Last 24 hours) at 11/20/2020 0829 Last data filed at 11/20/2020 0500 Gross per 24 hour  Intake 2152.06 ml  Output 1711 ml  Net 441.06 ml   Filed Weights   11/18/20 0500 11/19/20 0400 11/20/20 0313  Weight: 118 kg 120.3 kg 123 kg    Examination: General: Critically ill-appearing man lying in bed in no acute distress intubated, on some sedation HEENT: [place]/AT, eyes anicteric, scleral edema, eyelids with twitching. endotracheal tube in place, pupils 13m and reactive.  Neuro:  No pharyngeal gag, but strong cough reflex.  No withddrawal to pain.  No response to verbal stimulation.  Breathing over the vent. CV: Regular rate and rhythm with occasional PVCs PULM:  clear bilaterally, does have audible cuff leak but adequate return of volume GI: Obese, soft, nontender, nondistended Extremities: Bilateral BKA's, L with wrap in place) no peripheral edema or cyanosis Skin: No rashes, skin warm and dry.    Resolved Hospital Problem list     Assessment & Plan:   Acute encephalopathy, concern for anoxic injury.  Myoclonic seizure activity Plan -Appreciate neurology's assistance.  Continue Keppra.  -eeg poor reactivity with sedation off. This has been stopped -  Continue neuroprotective measures and avoid fevers -Continue seizure precautions  PEA cardiac arrest, unclear etiology, likely respiratory related given above recent events and increasing resp distress 9/8, less likely PE given patient has been on heparin gtt then transitioned to eliquis.  Has had evidence of elevated BNP and signs of edema on CXR and  failed his SLP on 9/8, with some concern for esophageal issues/ obstruction, but OGT was placed successfully.      - Continue supportive care measures - Goal normothermia, normal electrolytes, euglycemia  Hypertension -Labetalol as needed. -resume amoldipine and metoprolol 12.5   Shock, suspect related to sedating medications> resolved -echo normal LVEF 60-65% no RH dysfunction  A.Fib - previous TTE in 05/2020 with normal EF, G1DD. -Heparin rather than eliquis given potential need for procedures in ICU. Can transition to lovenox if renal function improves. - Tele monitoring  Acute hypoxic respiratory failure requiring MV Bilateral lower lobe pneumonia, likely due to aspiration  Likely component of acute pulmonary edema previously -LTVV, 4-8cc/kg IBW with goal Pplat<30 and DP<15 -decreasing sedation for myoclonus suppression today; PAD protocol -VAP prevention protocol - Daily SAT and SBT-failed SBT due to tachypnea today, but was performed before sedation could be weaned.  Mental status currently precludes extubation.  We will continue to assess -Wean FiO2 as able to maintain SPO2 greater than 90%. -s/p 5 days therapy - SLP eval 9/8 questioned some esophogeal obstruction & recommended esophagram.  Further workup pending neuro recovery.   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 -S/P Rocephin/Flagyl/Vancomycin for 7 days completed 9/7 Plan -neagtive blood cx  Anion Gap Metabolic Acidosis, due to lactic acidosis> resolved Acute on Chronic Renal Failure 2/2 to hypoperfusion associated with cardiac arrest Oliguria improved-1.2 L of urine output in the past 24 hours Hyperkalemia resolved Mild hyperchloremia Plan -cr stabilizing, uop remains adequate however -Strict I's/O - Renally dose meds and avoid nephrotoxic medications - removed indwelling foley 9/13 - Maintain adequate renal perfusion  Elevated LFT, suspect secondary to hypoperfusion/  shock-improving Hyperammonemia, resolved -No additional monitoring required  Hyperglycemia  DM; A1c 7.2 -improved and able to transition to subcut insulin - Goal blood glucose 140-180 while in the ICU  Hypophosphatemia, resolved Hypomagnesemia, resolved  Chronic normocytic anemia -Transfuse for hemoglobin less than 7 or hemodynamically significant bleeding  Will update wife at bedside later today.  She has been updated the last 2 days.  Daughter is in [place] for 2 weeks. (Day 6 of 14 on 9.14)  Best Practice (right click and "Reselect all SmartList Selections" daily)   Diet/type: tubefeeds DVT prophylaxis: systemic heparin GI prophylaxis: PPI Lines: Central line Foley:  Yes, and it is still needed Code Status:  DNR Last date of multidisciplinary goals of care discussion: 9/12 with wife  Labs   CBC: Recent Labs  Lab 11/15/20 0102 11/15/20 0135 11/16/20 0330 11/16/20 0438 11/16/20 0944 11/17/20 1005 11/18/20 0522 11/19/20 0302 11/20/20 0255  WBC 26.0*   < > 18.2*  --   --  18.6* 14.8* 17.6* 17.0*  NEUTROABS 17.1*  --   --   --   --   --   --   --   --   HGB 11.3*   < > 9.5*   < > 17.0 10.4* 9.2* 10.8* 10.1*  HCT 36.9*   < > 28.4*   < > 50.0 32.1* 29.5* 32.8* 31.3*  MCV 99.5   < > 89.0  --   --  93.0 94.2 92.4 93.7  PLT 267   < >  222  --   --  212 183 182 174   < > = values in this interval not displayed.    Basic Metabolic Panel: Recent Labs  Lab 11/16/20 0224 11/16/20 0438 11/16/20 0944 11/17/20 0402 11/17/20 1005 11/18/20 0522 11/19/20 0302 11/19/20 0920 11/20/20 0255  NA 144   < > 150*  --  143 147*  --  148* 149*  K 3.7   < > 3.8  --  4.5 4.1  --  4.4 4.7  CL 114*  --   --   --  112* 117*  --  117* 114*  CO2 22  --   --   --  23 23  --  22 22  GLUCOSE 194*  --   --   --  460* 306*  --  215* 186*  BUN 63*  --   --   --  68* 67*  --  68* 67*  CREATININE 2.69*  --   --   --  2.80* 3.05*  --  2.95* 2.77*  CALCIUM 8.6*  --   --   --  8.1* 8.3*  --  8.3*  8.4*  MG 1.6*  --   --  2.0  --  2.1 2.0  --  2.1  PHOS 1.6*  --   --  4.1  --  4.5 3.9  --  4.1   < > = values in this interval not displayed.   GFR: Estimated Creatinine Clearance: 28 mL/min (A) (by C-G formula based on SCr of 2.77 mg/dL (H)). Recent Labs  Lab 11/15/20 0102 11/15/20 0135 11/15/20 0536 11/15/20 0859 11/16/20 0330 11/17/20 1005 11/18/20 0522 11/19/20 0302 11/20/20 0255  WBC 26.0*   < >  --   --    < > 18.6* 14.8* 17.6* 17.0*  LATICACIDVEN 6.1*  --  4.5* 3.4*  --   --   --   --   --    < > = values in this interval not displayed.    Liver Function Tests: Recent Labs  Lab 11/15/20 0102 11/15/20 0902 11/16/20 0224 11/18/20 0522  AST 918*  --  298* 42*  ALT 452*  --  414* 173*  ALKPHOS 95  --  66 79  BILITOT 0.6  --  0.9 0.5  PROT 6.5  --  5.5* 5.7*  ALBUMIN 2.2* 2.1* 1.8* 1.6*   No results for input(s): LIPASE, AMYLASE in the last 168 hours. Recent Labs  Lab 11/15/20 0131 11/16/20 0224  AMMONIA 68* 25    ABG    Component Value Date/Time   PHART 7.408 11/16/2020 0944   PCO2ART 34.7 11/16/2020 0944   PO2ART 89 11/16/2020 0944   HCO3 22.0 11/16/2020 0944   TCO2 23 11/16/2020 0944   ACIDBASEDEF 2.0 11/16/2020 0944   O2SAT 97.0 11/16/2020 0944       This patient is critically ill with multiple organ system failure which requires frequent high complexity decision making, assessment, support, evaluation, and titration of therapies. This was completed through the application of advanced monitoring technologies and extensive interpretation of multiple databases. During this encounter critical care time was devoted to patient care services described in this note for 55mnutes.   JAudria Nine DO 11/20/20 8:29 AM [place]

## 2020-11-21 ENCOUNTER — Inpatient Hospital Stay (HOSPITAL_COMMUNITY): Payer: Medicare Other

## 2020-11-21 DIAGNOSIS — L03116 Cellulitis of left lower limb: Secondary | ICD-10-CM | POA: Diagnosis not present

## 2020-11-21 LAB — GLUCOSE, CAPILLARY
Glucose-Capillary: 107 mg/dL — ABNORMAL HIGH (ref 70–99)
Glucose-Capillary: 125 mg/dL — ABNORMAL HIGH (ref 70–99)
Glucose-Capillary: 140 mg/dL — ABNORMAL HIGH (ref 70–99)
Glucose-Capillary: 167 mg/dL — ABNORMAL HIGH (ref 70–99)
Glucose-Capillary: 172 mg/dL — ABNORMAL HIGH (ref 70–99)
Glucose-Capillary: 179 mg/dL — ABNORMAL HIGH (ref 70–99)
Glucose-Capillary: 182 mg/dL — ABNORMAL HIGH (ref 70–99)
Glucose-Capillary: 183 mg/dL — ABNORMAL HIGH (ref 70–99)
Glucose-Capillary: 188 mg/dL — ABNORMAL HIGH (ref 70–99)
Glucose-Capillary: 189 mg/dL — ABNORMAL HIGH (ref 70–99)
Glucose-Capillary: 204 mg/dL — ABNORMAL HIGH (ref 70–99)
Glucose-Capillary: 207 mg/dL — ABNORMAL HIGH (ref 70–99)
Glucose-Capillary: 208 mg/dL — ABNORMAL HIGH (ref 70–99)
Glucose-Capillary: 208 mg/dL — ABNORMAL HIGH (ref 70–99)
Glucose-Capillary: 211 mg/dL — ABNORMAL HIGH (ref 70–99)
Glucose-Capillary: 216 mg/dL — ABNORMAL HIGH (ref 70–99)
Glucose-Capillary: 236 mg/dL — ABNORMAL HIGH (ref 70–99)
Glucose-Capillary: 238 mg/dL — ABNORMAL HIGH (ref 70–99)
Glucose-Capillary: 241 mg/dL — ABNORMAL HIGH (ref 70–99)
Glucose-Capillary: 261 mg/dL — ABNORMAL HIGH (ref 70–99)
Glucose-Capillary: 262 mg/dL — ABNORMAL HIGH (ref 70–99)

## 2020-11-21 LAB — CBC
HCT: 30.1 % — ABNORMAL LOW (ref 39.0–52.0)
Hemoglobin: 9.5 g/dL — ABNORMAL LOW (ref 13.0–17.0)
MCH: 29.9 pg (ref 26.0–34.0)
MCHC: 31.6 g/dL (ref 30.0–36.0)
MCV: 94.7 fL (ref 80.0–100.0)
Platelets: 162 10*3/uL (ref 150–400)
RBC: 3.18 MIL/uL — ABNORMAL LOW (ref 4.22–5.81)
RDW: 16.1 % — ABNORMAL HIGH (ref 11.5–15.5)
WBC: 15.2 10*3/uL — ABNORMAL HIGH (ref 4.0–10.5)
nRBC: 0.1 % (ref 0.0–0.2)

## 2020-11-21 LAB — BASIC METABOLIC PANEL
Anion gap: 8 (ref 5–15)
BUN: 73 mg/dL — ABNORMAL HIGH (ref 8–23)
CO2: 21 mmol/L — ABNORMAL LOW (ref 22–32)
Calcium: 8.5 mg/dL — ABNORMAL LOW (ref 8.9–10.3)
Chloride: 123 mmol/L — ABNORMAL HIGH (ref 98–111)
Creatinine, Ser: 2.95 mg/dL — ABNORMAL HIGH (ref 0.61–1.24)
GFR, Estimated: 21 mL/min — ABNORMAL LOW (ref >60)
Glucose, Bld: 236 mg/dL — ABNORMAL HIGH (ref 70–99)
Potassium: 4.7 mmol/L (ref 3.5–5.1)
Sodium: 152 mmol/L — ABNORMAL HIGH (ref 135–145)

## 2020-11-21 LAB — HEPARIN LEVEL (UNFRACTIONATED): Heparin Unfractionated: 0.3 IU/mL (ref 0.30–0.70)

## 2020-11-21 MED ORDER — PANTOPRAZOLE 2 MG/ML SUSPENSION
40.0000 mg | Freq: Every day | ORAL | Status: DC
  Administered 2020-11-22 – 2020-11-27 (×6): 40 mg
  Filled 2020-11-21 (×2): qty 20

## 2020-11-21 NOTE — Progress Notes (Signed)
ANTICOAGULATION CONSULT NOTE  Pharmacy Consult for heparin Indication: atrial fibrillation  Allergies  Allergen Reactions   Prednisone Anaphylaxis and Shortness Of Breath   Dulaglutide Nausea Only   Atorvastatin    Lovastatin     Other reaction(s): OTHER REACTION    Patient Measurements: Height: '5\' 10"'$  (177.8 cm) Weight: 119.7 kg (263 lb 14.3 oz) IBW/kg (Calculated) : 73 Heparin Dosing Weight: 99kg  Vital Signs: Temp: 98.3 F (36.8 C) (09/15 0400) Temp Source: Oral (09/15 0400) BP: 192/64 (09/15 1200) Pulse Rate: 63 (09/15 1200)  Labs: Recent Labs    11/19/20 0302 11/19/20 0920 11/20/20 0255 11/20/20 1411 11/20/20 2245 11/21/20 0412  HGB 10.8*  --  10.1*  --  9.9* 9.5*  HCT 32.8*  --  31.3*  --  29.0* 30.1*  PLT 182  --  174  --   --  162  HEPARINUNFRC 0.45  --  0.27* 0.57  --  0.30  CREATININE  --  2.95* 2.77*  --   --  2.95*     Estimated Creatinine Clearance: 25.9 mL/min (A) (by C-G formula based on SCr of 2.95 mg/dL (H)).   Assessment: 63 yoM on apixaban PTA for hx AF admitted with gangrenous toe s/p L BKA. Apixaban resumed 9/8, then pt suffered PEA arrest. Pharmacy asked to transition back to IV Heparin. CT Head negative for bleeding. Last dose 9/8 apixaban~2100 9/9  Heparin level within range  and correlated with aptt use heparin level for heparin dosing after 9/11. Heparin drip 1450 uts/hr heparin level 0.3 at goal no bleeding noted and CBC stable    Goal of Therapy:  Heparin level 0.3-0.7 units/ml aPTT 66-102 seconds Monitor platelets by anticoagulation protocol: Yes   Plan:  Continue Heparin at 1450 units/h  Follow-up daily Heparin level and CBC     Bonnita Nasuti Pharm.D. CPP, BCPS Clinical Pharmacist [phone] 11/21/2020 1:17 PM

## 2020-11-21 NOTE — Progress Notes (Signed)
RT NOTE: ETT found at 21cm at the lip. Advanced back to 23cm at the lip. Will continue to monitor.

## 2020-11-21 NOTE — Progress Notes (Signed)
NAME:  Patrick Shelton, MRN:  [id number], DOB:  04-Feb-1941, LOS: 7 ADMISSION DATE:  11/05/2020, CONSULTATION DATE:  11/15/2020 REFERRING MD:  Dr. 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 Vascular consulted. 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 Stay complicated by acute on chronic renal failure, hyperkalemia, encephalopathy, and respiratory distress.   9/8 patient with hypoxia and labored breathing with productive cough with white clear frothy sputum. Given lasix. 9/9 patient found unresponsive and pulseless. PEA. Required 15 minutes of CPR, 1 Calcium, 4 EPI, and 1 sodium Bicarb prior to ROSC. On arrival to ICU patient is hypotensive, bradycardiac, intubated, and unresponsive.   Pertinent  Medical History  DM, HTN, HLD, PAD, A.Fib on Eliquis, s/p Right BKA, now Left BKA   Significant Hospital Events: Including procedures, antibiotic start and stop dates in addition to other pertinent events   9/1 Presents to ED with suspected osteo of 4th toe to LLE  9/6 L BKA 9/9 early am cardiac arrest, prolonged, intubated, on pressors  Interim History / Subjective:  9/15: no acute overnight events. Remains unresponsive.   9/14: remains unresponsive on vent. Able to tolerate sbt with increased PS however for short period. Renal function stable as is wbc. Able to transition to subcut insulin today.   9/13: propofol stopped yesterday. Added low dose fentanyl for comfort. Eeg placed and despite sedation wean and pt eyes opening neuro reports flat with poor prognosis for meaningful recovery.  9/12: no overnight events noted. LTM discontinued yesterday. Aed per neuro. Agree with stopping propofol and monitor for myoclonus/sz like activity. Family would like to await decision making until daughter returns back from trip to [place] (day 4 of 14 as of 9/12) 9/11:Hypertensive overnight, sometimes with  stimulation. No seizure activity.   Objective   Blood pressure (!) 213/147, pulse (!) 115, temperature 98.3 F (36.8 C), temperature source Oral, resp. rate (!) 25, height '5\' 10"'$  (1.778 m), weight 119.7 kg, SpO2 97 %.    Vent Mode: PRVC FiO2 (%):  [40 %] 40 % Set Rate:  [18 bmp] 18 bmp Vt Set:  [580 mL] 580 mL PEEP:  [5 cmH20] 5 cmH20 Plateau Pressure:  [19 cmH20-20 cmH20] 19 cmH20   Intake/Output Summary (Last 24 hours) at 11/21/2020 0831 Last data filed at 11/21/2020 0800 Gross per 24 hour  Intake 1918.85 ml  Output 2930 ml  Net -1011.15 ml   Filed Weights   11/19/20 0400 11/20/20 0313 11/21/20 0620  Weight: 120.3 kg 123 kg 119.7 kg    Examination: General: Critically ill-appearing man lying in bed in no acute distress intubated, on some sedation HEENT: [place]/AT, eyes anicteric, scleral edema, eyelids with twitching. endotracheal tube in place, pupils 37m and reactive, L upward deviation  Neuro:  No pharyngeal gag, but strong cough reflex.  No withddrawal to pain.  No response to verbal stimulation.  Breathing over the vent. CV: Regular rate and rhythm with occasional PVCs PULM:  clear bilaterally, does have audible cuff leak but adequate return of volume GI: Obese, protuberant, soft, nontender, nondistended Extremities: Bilateral BKA's, L with wrap in place) no peripheral edema or cyanosis Skin: No rashes, skin warm and dry.    Resolved Hospital Problem list     Assessment & Plan:   Acute encephalopathy, concern for anoxic injury.  Myoclonic seizure activity Plan -Appreciate neurology's assistance.  Continue Keppra.  -eeg poor reactivity with  sedation off. This has been stopped - Continue neuroprotective measures and avoid fevers -Continue seizure precautions  PEA cardiac arrest, unclear etiology, likely respiratory related given above recent events and increasing resp distress 9/8, less likely PE given patient has been on heparin gtt then transitioned to eliquis.  Has  had evidence of elevated BNP and signs of edema on CXR and failed his SLP on 9/8, with some concern for esophageal issues/ obstruction, but OGT was placed successfully.      - Continue supportive care measures - Goal normothermia, normal electrolytes, euglycemia  Hypertension -Labetalol as needed. -resume amoldipine and metoprolol 12.5   Shock, suspect related to sedating medications> resolved -echo normal LVEF 60-65% no RH dysfunction  A.Fib - previous TTE in 05/2020 with normal EF, G1DD. -Heparin rather than eliquis given potential need for procedures in ICU. Can transition to lovenox if renal function improves. - Tele monitoring  Acute hypoxic respiratory failure requiring MV Bilateral lower lobe pneumonia, likely due to aspiration  Likely component of acute pulmonary edema previously -LTVV, 4-8cc/kg IBW with goal Pplat<30 and DP<15 -decreasing sedation for myoclonus suppression today; PAD protocol -VAP prevention protocol - Daily SAT and SBT-Mental status currently precludes extubation. Was able to do short sbt 9/14 We will continue to assess -Wean FiO2 as able to maintain SPO2 greater than 90%. -s/p 5 days therapy - SLP eval 9/8 questioned some esophogeal obstruction & recommended esophagram.  Further workup pending neuro recovery.   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 -S/P Rocephin/Flagyl/Vancomycin for 7 days completed 9/7 Plan -neagtive blood cx  Anion Gap Metabolic Acidosis, due to lactic acidosis> resolved Acute on Chronic Renal Failure 2/2 to hypoperfusion associated with cardiac arrest Oliguria improved-1.2 L of urine output in the past 24 hours Hyperkalemia resolved Mild hyperchloremia Plan -cr stabilizing, uop remains adequate however -Strict I's/O - Renally dose meds and avoid nephrotoxic medications - removed indwelling foley 9/13 - Maintain adequate renal perfusion  Elevated LFT, suspect secondary to hypoperfusion/  shock-improving Hyperammonemia, resolved -No additional monitoring required  Hyperglycemia  DM; A1c 7.2 -brief period off insulin infusion overnight but back on gtt 2/2 bs >300 - Goal blood glucose 140-180 while in the ICU  Hypophosphatemia, resolved Hypomagnesemia, resolved  Chronic normocytic anemia -Transfuse for hemoglobin less than 7 or hemodynamically significant bleeding  Will update wife at bedside later today.  She has been updated the last 2 days.  Daughter is in [place] for 2 weeks. (Day 7 of 14 on 9.15)  Best Practice (right click and "Reselect all SmartList Selections" daily)   Diet/type: tubefeeds DVT prophylaxis: systemic heparin GI prophylaxis: PPI Lines: Central line Foley:  Yes, and it is still needed Code Status:  DNR Last date of multidisciplinary goals of care discussion: 9/12 with wife  Labs   CBC: Recent Labs  Lab 11/15/20 0102 11/15/20 0135 11/17/20 1005 11/18/20 0522 11/19/20 0302 11/20/20 0255 11/20/20 2245 11/21/20 0412  WBC 26.0*   < > 18.6* 14.8* 17.6* 17.0*  --  15.2*  NEUTROABS 17.1*  --   --   --   --   --   --   --   HGB 11.3*   < > 10.4* 9.2* 10.8* 10.1* 9.9* 9.5*  HCT 36.9*   < > 32.1* 29.5* 32.8* 31.3* 29.0* 30.1*  MCV 99.5   < > 93.0 94.2 92.4 93.7  --  94.7  PLT 267   < > 212 183 182 174  --  162   < > =  values in this interval not displayed.    Basic Metabolic Panel: Recent Labs  Lab 11/16/20 0224 11/16/20 0438 11/17/20 0402 11/17/20 1005 11/18/20 0522 11/19/20 0302 11/19/20 0920 11/20/20 0255 11/20/20 2245 11/21/20 0412  NA 144   < >  --  143 147*  --  148* 149* 154* 152*  K 3.7   < >  --  4.5 4.1  --  4.4 4.7 4.6 4.7  CL 114*  --   --  112* 117*  --  117* 114*  --  123*  CO2 22  --   --  23 23  --  22 22  --  21*  GLUCOSE 194*  --   --  460* 306*  --  215* 186*  --  236*  BUN 63*  --   --  68* 67*  --  68* 67*  --  73*  CREATININE 2.69*  --   --  2.80* 3.05*  --  2.95* 2.77*  --  2.95*  CALCIUM 8.6*  --   --   8.1* 8.3*  --  8.3* 8.4*  --  8.5*  MG 1.6*  --  2.0  --  2.1 2.0  --  2.1  --   --   PHOS 1.6*  --  4.1  --  4.5 3.9  --  4.1  --   --    < > = values in this interval not displayed.   GFR: Estimated Creatinine Clearance: 25.9 mL/min (A) (by C-G formula based on SCr of 2.95 mg/dL (H)). Recent Labs  Lab 11/15/20 0102 11/15/20 0135 11/15/20 0536 11/15/20 0859 11/16/20 0330 11/18/20 0522 11/19/20 0302 11/20/20 0255 11/21/20 0412  WBC 26.0*   < >  --   --    < > 14.8* 17.6* 17.0* 15.2*  LATICACIDVEN 6.1*  --  4.5* 3.4*  --   --   --   --   --    < > = values in this interval not displayed.    Liver Function Tests: Recent Labs  Lab 11/15/20 0102 11/15/20 0902 11/16/20 0224 11/18/20 0522  AST 918*  --  298* 42*  ALT 452*  --  414* 173*  ALKPHOS 95  --  66 79  BILITOT 0.6  --  0.9 0.5  PROT 6.5  --  5.5* 5.7*  ALBUMIN 2.2* 2.1* 1.8* 1.6*   No results for input(s): LIPASE, AMYLASE in the last 168 hours. Recent Labs  Lab 11/15/20 0131 11/16/20 0224  AMMONIA 68* 25    ABG    Component Value Date/Time   PHART 7.342 (L) 11/20/2020 2245   PCO2ART 42.0 11/20/2020 2245   PO2ART 109 (H) 11/20/2020 2245   HCO3 22.7 11/20/2020 2245   TCO2 24 11/20/2020 2245   ACIDBASEDEF 3.0 (H) 11/20/2020 2245   O2SAT 98.0 11/20/2020 2245       This patient is critically ill with multiple organ system failure which requires frequent high complexity decision making, assessment, support, evaluation, and titration of therapies. This was completed through the application of advanced monitoring technologies and extensive interpretation of multiple databases. During this encounter critical care time was devoted to patient care services described in this note for 33 minutes.   Audria Nine, DO 11/21/20 8:31 AM [facility]

## 2020-11-22 ENCOUNTER — Inpatient Hospital Stay (HOSPITAL_COMMUNITY): Payer: Medicare Other

## 2020-11-22 DIAGNOSIS — R4182 Altered mental status, unspecified: Secondary | ICD-10-CM | POA: Diagnosis not present

## 2020-11-22 DIAGNOSIS — I469 Cardiac arrest, cause unspecified: Secondary | ICD-10-CM | POA: Diagnosis not present

## 2020-11-22 LAB — GLUCOSE, CAPILLARY
Glucose-Capillary: 172 mg/dL — ABNORMAL HIGH (ref 70–99)
Glucose-Capillary: 173 mg/dL — ABNORMAL HIGH (ref 70–99)
Glucose-Capillary: 177 mg/dL — ABNORMAL HIGH (ref 70–99)
Glucose-Capillary: 200 mg/dL — ABNORMAL HIGH (ref 70–99)
Glucose-Capillary: 208 mg/dL — ABNORMAL HIGH (ref 70–99)
Glucose-Capillary: 216 mg/dL — ABNORMAL HIGH (ref 70–99)
Glucose-Capillary: 233 mg/dL — ABNORMAL HIGH (ref 70–99)
Glucose-Capillary: 235 mg/dL — ABNORMAL HIGH (ref 70–99)
Glucose-Capillary: 254 mg/dL — ABNORMAL HIGH (ref 70–99)
Glucose-Capillary: 272 mg/dL — ABNORMAL HIGH (ref 70–99)

## 2020-11-22 LAB — HEPARIN LEVEL (UNFRACTIONATED): Heparin Unfractionated: 0.53 IU/mL (ref 0.30–0.70)

## 2020-11-22 LAB — BASIC METABOLIC PANEL
Anion gap: 13 (ref 5–15)
BUN: 73 mg/dL — ABNORMAL HIGH (ref 8–23)
CO2: 22 mmol/L (ref 22–32)
Calcium: 9 mg/dL (ref 8.9–10.3)
Chloride: 118 mmol/L — ABNORMAL HIGH (ref 98–111)
Creatinine, Ser: 2.71 mg/dL — ABNORMAL HIGH (ref 0.61–1.24)
GFR, Estimated: 23 mL/min — ABNORMAL LOW (ref >60)
Glucose, Bld: 235 mg/dL — ABNORMAL HIGH (ref 70–99)
Potassium: 5.1 mmol/L (ref 3.5–5.1)
Sodium: 153 mmol/L — ABNORMAL HIGH (ref 135–145)

## 2020-11-22 LAB — TRIGLYCERIDES: Triglycerides: 192 mg/dL — ABNORMAL HIGH (ref <150)

## 2020-11-22 MED ORDER — INSULIN ASPART 100 UNIT/ML IJ SOLN
2.0000 [IU] | INTRAMUSCULAR | Status: DC
  Administered 2020-11-22 (×2): 6 [IU] via SUBCUTANEOUS
  Administered 2020-11-22 (×2): 4 [IU] via SUBCUTANEOUS
  Administered 2020-11-22 – 2020-11-23 (×4): 6 [IU] via SUBCUTANEOUS
  Administered 2020-11-23 (×3): 2 [IU] via SUBCUTANEOUS
  Administered 2020-11-23 – 2020-11-24 (×4): 6 [IU] via SUBCUTANEOUS
  Administered 2020-11-24: 4 [IU] via SUBCUTANEOUS
  Administered 2020-11-25 (×3): 6 [IU] via SUBCUTANEOUS

## 2020-11-22 MED ORDER — ROCURONIUM BROMIDE 10 MG/ML (PF) SYRINGE
50.0000 mg | PREFILLED_SYRINGE | Freq: Once | INTRAVENOUS | Status: AC
  Filled 2020-11-22: qty 5

## 2020-11-22 MED ORDER — INSULIN DETEMIR 100 UNIT/ML ~~LOC~~ SOLN
38.0000 [IU] | Freq: Two times a day (BID) | SUBCUTANEOUS | Status: DC
  Administered 2020-11-22: 38 [IU] via SUBCUTANEOUS
  Filled 2020-11-22 (×2): qty 0.38

## 2020-11-22 MED ORDER — FREE WATER
300.0000 mL | Freq: Four times a day (QID) | Status: DC
  Administered 2020-11-22 – 2020-11-28 (×23): 300 mL

## 2020-11-22 MED ORDER — METOPROLOL TARTRATE 12.5 MG HALF TABLET
12.5000 mg | ORAL_TABLET | Freq: Two times a day (BID) | ORAL | Status: DC
  Administered 2020-11-23: 12.5 mg
  Filled 2020-11-22 (×2): qty 1

## 2020-11-22 MED ORDER — ROCURONIUM BROMIDE 10 MG/ML (PF) SYRINGE
PREFILLED_SYRINGE | INTRAVENOUS | Status: AC
  Administered 2020-11-22: 50 mg via INTRAVENOUS
  Filled 2020-11-22: qty 10

## 2020-11-22 MED ORDER — DEXTROSE IN LACTATED RINGERS 5 % IV SOLN: INTRAVENOUS | Status: DC

## 2020-11-22 MED ORDER — NUTRISOURCE FIBER PO PACK
1.0000 | PACK | Freq: Two times a day (BID) | ORAL | Status: DC | PRN
  Administered 2020-11-23: 1
  Filled 2020-11-22 (×2): qty 1

## 2020-11-22 MED ORDER — INSULIN DETEMIR 100 UNIT/ML ~~LOC~~ SOLN
20.0000 [IU] | Freq: Once | SUBCUTANEOUS | Status: AC
  Administered 2020-11-22: 20 [IU] via SUBCUTANEOUS
  Filled 2020-11-22: qty 0.2

## 2020-11-22 MED ORDER — INSULIN ASPART 100 UNIT/ML IJ SOLN
13.0000 [IU] | INTRAMUSCULAR | Status: DC
  Administered 2020-11-22 – 2020-11-23 (×12): 13 [IU] via SUBCUTANEOUS

## 2020-11-22 MED ORDER — DEXTROSE 10 % IV SOLN: INTRAVENOUS | Status: DC | PRN

## 2020-11-22 MED ORDER — PROPOFOL 1000 MG/100ML IV EMUL
5.0000 ug/kg/min | INTRAVENOUS | Status: DC
Start: 2020-11-22 — End: 2020-11-28
  Administered 2020-11-22: 10 ug/kg/min via INTRAVENOUS
  Administered 2020-11-22 – 2020-11-23 (×4): 40 ug/kg/min via INTRAVENOUS
  Administered 2020-11-23: 39.973 ug/kg/min via INTRAVENOUS
  Administered 2020-11-24: 25 ug/kg/min via INTRAVENOUS
  Administered 2020-11-24: 30 ug/kg/min via INTRAVENOUS
  Administered 2020-11-24: 25 ug/kg/min via INTRAVENOUS
  Administered 2020-11-24: 30 ug/kg/min via INTRAVENOUS
  Administered 2020-11-24: 20 ug/kg/min via INTRAVENOUS
  Administered 2020-11-25 – 2020-11-26 (×10): 25 ug/kg/min via INTRAVENOUS
  Administered 2020-11-27: 30 ug/kg/min via INTRAVENOUS
  Administered 2020-11-27: 25 ug/kg/min via INTRAVENOUS
  Administered 2020-11-27 (×2): 30 ug/kg/min via INTRAVENOUS
  Administered 2020-11-27 – 2020-11-28 (×3): 20 ug/kg/min via INTRAVENOUS
  Filled 2020-11-22 (×8): qty 100
  Filled 2020-11-22: qty 200
  Filled 2020-11-22 (×15): qty 100
  Filled 2020-11-22: qty 200
  Filled 2020-11-22: qty 100

## 2020-11-22 MED ORDER — INSULIN DETEMIR 100 UNIT/ML ~~LOC~~ SOLN
48.0000 [IU] | Freq: Two times a day (BID) | SUBCUTANEOUS | Status: DC
  Administered 2020-11-22 – 2020-11-26 (×8): 48 [IU] via SUBCUTANEOUS
  Filled 2020-11-22 (×10): qty 0.48

## 2020-11-22 NOTE — Progress Notes (Signed)
EEG completed, results pending. 

## 2020-11-22 NOTE — Procedures (Signed)
Patient Name: Patrick Shelton  MRN: [id number]  Epilepsy Attending: Lora Havens  Referring Physician/Provider: Dr Ina Homes Date: 11/22/2020 Duration: 25.30 mins   Patient history: 80 year old male with altered mental status and concern for anoxic brain injury noted to have abnormal movements concerning for myoclonus or seizure.  EEG to evaluate for seizures.   Level of alertness: comatose   AEDs during EEG study: LEV, propofol   Technical aspects: This EEG study was done with scalp electrodes positioned according to the 10-20 International system of electrode placement. Electrical activity was acquired at a sampling rate of '500Hz'$  and reviewed with a high frequency filter of '70Hz'$  and a low frequency filter of '1Hz'$ . EEG data were recorded continuously and digitally stored.    Description: EEG showed continuous generalized background suppression.  EEG was not reactive to tactile and noxious stimulation.    ABNORMALITY -Background suppression, generalized   IMPRESSION: This study was suggestive of profound diffuse encephalopathy. No seizures or definite epileptiform discharges were seen during the study. This eeg pattern is non specific in etiology but could be secondary to sedation, anoxic-hypoxic brain injury in setting of cardiac arrest.       Patrick Shelton Patrick Shelton

## 2020-11-22 NOTE — Progress Notes (Signed)
ANTICOAGULATION CONSULT NOTE  Pharmacy Consult for heparin Indication: atrial fibrillation  Allergies  Allergen Reactions   Prednisone Anaphylaxis and Shortness Of Breath   Dulaglutide Nausea Only   Atorvastatin    Lovastatin     Other reaction(s): OTHER REACTION    Patient Measurements: Height: '5\' 10"'$  (177.8 cm) Weight: 123 kg (271 lb 2.7 oz) IBW/kg (Calculated) : 73 Heparin Dosing Weight: 99kg  Vital Signs: Temp: 98.7 F (37.1 C) (09/16 0400) Temp Source: Oral (09/16 0400) BP: 157/61 (09/16 0600) Pulse Rate: 62 (09/16 0600)  Labs: Recent Labs    11/20/20 0255 11/20/20 1411 11/20/20 2245 11/21/20 0412 11/22/20 0529  HGB 10.1*  --  9.9* 9.5*  --   HCT 31.3*  --  29.0* 30.1*  --   PLT 174  --   --  162  --   HEPARINUNFRC 0.27* 0.57  --  0.30 0.53  CREATININE 2.77*  --   --  2.95* 2.71*     Estimated Creatinine Clearance: 28.6 mL/min (A) (by C-G formula based on SCr of 2.71 mg/dL (H)).   Assessment: 74 yoM on apixaban PTA for hx AF admitted with gangrenous toe s/p L BKA. Apixaban resumed 9/8, then pt suffered PEA arrest. Pharmacy asked to transition back to IV Heparin. CT Head negative for bleeding. Last dose 9/8 apixaban~2100 9/9  Heparin level remains therapeutic, CBC stable.  Goal of Therapy:  Heparin level 0.3-0.7 units/ml aPTT 66-102 seconds Monitor platelets by anticoagulation protocol: Yes   Plan:  Continue Heparin at 1450 units/h  Follow-up daily Heparin level and CBC    Arrie Senate, PharmD, [place], [facility] Clinical Pharmacist [phone] Please check AMION for all [place] numbers 11/22/2020

## 2020-11-22 NOTE — Progress Notes (Addendum)
[facility] Progress Note Patient Name: Patrick Shelton DOB: [birth year] MRN: [id number]   Date of Service  11/22/2020  HPI/Events of Note  Pt remains on the insulin gtt and glucose levels have been dropping  eICU Interventions  Pt is on tube feeds; glucose is in goal range; will stop insulin gtt and use sliding scale     Intervention Category Intermediate Interventions: Hyperglycemia - evaluation and treatment  Tilden Dome 11/22/2020, 3:08 AM

## 2020-11-22 NOTE — Progress Notes (Signed)
NAME:  Patrick Shelton, MRN:  [id number], DOB:  1940/09/28, LOS: 31 ADMISSION DATE:  11/02/2020, CONSULTATION DATE:  11/15/2020 REFERRING MD:  Dr. 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 Vascular consulted. 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 Stay complicated by acute on chronic renal failure, hyperkalemia, encephalopathy, and respiratory distress.   9/8 patient with hypoxia and labored breathing with productive cough with white clear frothy sputum. Given lasix. 9/9 patient found unresponsive and pulseless. PEA. Required 15 minutes of CPR, 1 Calcium, 4 EPI, and 1 sodium Bicarb prior to ROSC. On arrival to ICU patient is hypotensive, bradycardiac, intubated, and unresponsive.   Pertinent  Medical History  DM, HTN, HLD, PAD, A.Fib on Eliquis, s/p Right BKA, now Left BKA   Significant Hospital Events: Including procedures, antibiotic start and stop dates in addition to other pertinent events   9/1 Presents to ED with suspected osteo of 4th toe to LLE  9/6 L BKA 9/9 early am cardiac arrest, prolonged, intubated, on pressors  Interim History / Subjective:  9/15: no acute overnight events. Remains unresponsive.   9/14: remains unresponsive on vent. Able to tolerate sbt with increased PS however for short period. Renal function stable as is wbc. Able to transition to subcut insulin today.   9/13: propofol stopped yesterday. Added low dose fentanyl for comfort. Eeg placed and despite sedation wean and pt eyes opening neuro reports flat with poor prognosis for meaningful recovery.  9/12: no overnight events noted. LTM discontinued yesterday. Aed per neuro. Agree with stopping propofol and monitor for myoclonus/sz like activity. Family would like to await decision making until daughter returns back from trip to [place] (day 4 of 14 as of 9/12) 9/11:Hypertensive overnight, sometimes with  stimulation. No seizure activity.   Objective   Blood pressure (!) 157/61, pulse 62, temperature 98.7 F (37.1 C), temperature source Oral, resp. rate 18, height '5\' 10"'$  (1.778 m), weight 123 kg, SpO2 97 %.    Vent Mode: PRVC FiO2 (%):  [40 %] 40 % Set Rate:  [18 bmp] 18 bmp Vt Set:  [580 mL] 580 mL PEEP:  [5 cmH20] 5 cmH20 Plateau Pressure:  [23 cmH20-25 cmH20] 25 cmH20   Intake/Output Summary (Last 24 hours) at 11/22/2020 0759 Last data filed at 11/22/2020 0600 Gross per 24 hour  Intake 1857.47 ml  Output 2185 ml  Net -327.53 ml    Filed Weights   11/20/20 0313 11/21/20 0620 11/22/20 0500  Weight: 123 kg 119.7 kg 123 kg    Examination: Unresponsive man on vent Triggers vent but only 4x/min +cough/gag, pupillary, corneal No response to painful stimuli Lungs with scattered rhonci, thick clear secretions Bilateral BKA, L looks CDI  With sedation wean apparently all he does is gag on vent  Resolved Hospital Problem list     Assessment & Plan:  In hospital cardiac arrest, prolonged; respiratory event/ probably aspiration thought to be culprit Anoxic brain injury confirmed on exam, MRI, EEG portends grim prognosis Anoxic myoclonus Acute kidney injury DM2 with hyperglycemia L fourth toe gangrene post left BKA- completed abx course Afib/HTN DNR  - Fentanyl for comfort - Add FWF - Levemir adjusted - Awaiting daughter's return from [place] for palliative withdraw of life support, DNR and supportive measures in interim including ventilator  Best Practice (right click and "Reselect all SmartList Selections" daily)   Diet/type: tubefeeds DVT prophylaxis: systemic heparin  GI prophylaxis: PPI Lines: Central line Foley:  Yes, and it is still needed Code Status:  DNR Last date of multidisciplinary goals of care discussion: 9/12 with wife  32 minutes CC time  Candee Furbish, MD 11/22/20 7:59 AM [facility]

## 2020-11-22 NOTE — Progress Notes (Signed)
Called and updated wife.  Erskine Emery MD PCCM

## 2020-11-23 DIAGNOSIS — I469 Cardiac arrest, cause unspecified: Secondary | ICD-10-CM | POA: Diagnosis not present

## 2020-11-23 LAB — HEPARIN LEVEL (UNFRACTIONATED): Heparin Unfractionated: 0.52 IU/mL (ref 0.30–0.70)

## 2020-11-23 LAB — BASIC METABOLIC PANEL
Anion gap: 11 (ref 5–15)
BUN: 83 mg/dL — ABNORMAL HIGH (ref 8–23)
CO2: 19 mmol/L — ABNORMAL LOW (ref 22–32)
Calcium: 8.8 mg/dL — ABNORMAL LOW (ref 8.9–10.3)
Chloride: 122 mmol/L — ABNORMAL HIGH (ref 98–111)
Creatinine, Ser: 3.11 mg/dL — ABNORMAL HIGH (ref 0.61–1.24)
GFR, Estimated: 19 mL/min — ABNORMAL LOW (ref >60)
Glucose, Bld: 180 mg/dL — ABNORMAL HIGH (ref 70–99)
Potassium: 4.5 mmol/L (ref 3.5–5.1)
Sodium: 152 mmol/L — ABNORMAL HIGH (ref 135–145)

## 2020-11-23 LAB — CBC
HCT: 26.7 % — ABNORMAL LOW (ref 39.0–52.0)
Hemoglobin: 8.5 g/dL — ABNORMAL LOW (ref 13.0–17.0)
MCH: 31 pg (ref 26.0–34.0)
MCHC: 31.8 g/dL (ref 30.0–36.0)
MCV: 97.4 fL (ref 80.0–100.0)
Platelets: 165 10*3/uL (ref 150–400)
RBC: 2.74 MIL/uL — ABNORMAL LOW (ref 4.22–5.81)
RDW: 17.5 % — ABNORMAL HIGH (ref 11.5–15.5)
WBC: 11.9 10*3/uL — ABNORMAL HIGH (ref 4.0–10.5)
nRBC: 0.3 % — ABNORMAL HIGH (ref 0.0–0.2)

## 2020-11-23 LAB — GLUCOSE, CAPILLARY
Glucose-Capillary: 133 mg/dL — ABNORMAL HIGH (ref 70–99)
Glucose-Capillary: 143 mg/dL — ABNORMAL HIGH (ref 70–99)
Glucose-Capillary: 145 mg/dL — ABNORMAL HIGH (ref 70–99)
Glucose-Capillary: 201 mg/dL — ABNORMAL HIGH (ref 70–99)
Glucose-Capillary: 224 mg/dL — ABNORMAL HIGH (ref 70–99)
Glucose-Capillary: 258 mg/dL — ABNORMAL HIGH (ref 70–99)

## 2020-11-23 LAB — TRIGLYCERIDES: Triglycerides: 343 mg/dL — ABNORMAL HIGH (ref <150)

## 2020-11-23 MED ORDER — ACETAMINOPHEN 325 MG PO TABS
650.0000 mg | ORAL_TABLET | ORAL | Status: DC
  Administered 2020-11-25 – 2020-11-26 (×5): 650 mg
  Filled 2020-11-23 (×5): qty 2

## 2020-11-23 MED ORDER — ACETAMINOPHEN 650 MG RE SUPP: 650.0000 mg | Freq: Once | RECTAL | Status: AC

## 2020-11-23 MED ORDER — ACETAMINOPHEN 325 MG PO TABS: 650.0000 mg | ORAL_TABLET | ORAL | Status: DC

## 2020-11-23 MED ORDER — ACETAMINOPHEN 650 MG RE SUPP: 650.0000 mg | RECTAL | Status: DC

## 2020-11-23 MED ORDER — LACTATED RINGERS IV BOLUS
500.0000 mL | Freq: Once | INTRAVENOUS | Status: AC
  Administered 2020-11-23: 500 mL via INTRAVENOUS

## 2020-11-23 MED ORDER — ACETAMINOPHEN 160 MG/5ML PO SOLN: 650.0000 mg | ORAL | Status: DC

## 2020-11-23 MED ORDER — ACETAMINOPHEN 325 MG PO TABS: 650.0000 mg | ORAL_TABLET | Freq: Once | ORAL | Status: AC

## 2020-11-23 MED ORDER — ACETAMINOPHEN 160 MG/5ML PO SOLN
650.0000 mg | ORAL | Status: DC
  Administered 2020-11-23 (×2): 650 mg
  Filled 2020-11-23 (×3): qty 20.3

## 2020-11-23 MED ORDER — ACETAMINOPHEN 160 MG/5ML PO SOLN
650.0000 mg | Freq: Once | ORAL | Status: AC
  Administered 2020-11-23: 650 mg

## 2020-11-23 MED ORDER — ACETAMINOPHEN 160 MG/5ML PO SOLN
650.0000 mg | ORAL | Status: DC
  Administered 2020-11-23 – 2020-11-25 (×10): 650 mg
  Filled 2020-11-23 (×11): qty 20.3

## 2020-11-23 NOTE — Progress Notes (Signed)
  Daily Progress Note  S/p: 12/03/2020 below-knee amputation  Subjective: Patient mains critical in the ICU.  Nonverbal this morning.  Pain appears well controlled.  Objective: Vitals:   11/23/20 1000 11/23/20 1133  BP: (!) 126/52 (!) 200/63  Pulse: (!) 51 70  Resp: 18   Temp:    SpO2: 99%     Physical Examination The amputation site is clean, incision well opposed.   No swelling erythema induration Minor serous drainage  ASSESSMENT/PLAN:  Patient remains critical however his below-knee amputation site is healing appropriately. Appreciate excellent care by primary team. Please call if questions or concerns arise.   Cassandria Santee MD MS [facility] [phone] 11/23/2020  11:54 AM

## 2020-11-23 NOTE — Progress Notes (Signed)
NAME:  Patrick Shelton, MRN:  [id number], DOB:  18-Sep-1940, LOS: 95 ADMISSION DATE:  10/18/2020, CONSULTATION DATE:  11/15/2020 REFERRING MD:  Dr. 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 Vascular consulted. 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 Stay complicated by acute on chronic renal failure, hyperkalemia, encephalopathy, and respiratory distress.   9/8 patient with hypoxia and labored breathing with productive cough with white clear frothy sputum. Given lasix. 9/9 patient found unresponsive and pulseless. PEA. Required 15 minutes of CPR, 1 Calcium, 4 EPI, and 1 sodium Bicarb prior to ROSC. On arrival to ICU patient is hypotensive, bradycardiac, intubated, and unresponsive.   Pertinent  Medical History  DM, HTN, HLD, PAD, A.Fib on Eliquis, s/p Right BKA, now Left BKA   Significant Hospital Events: Including procedures, antibiotic start and stop dates in addition to other pertinent events   9/1 Presents to ED with suspected osteo of 4th toe to LLE  9/6 L BKA 9/9 early am cardiac arrest, prolonged, intubated, on pressors 9/15: no acute overnight events. Remains unresponsive.   9/14: remains unresponsive on vent. Able to tolerate sbt with increased PS however for short period. Renal function stable as is wbc. Able to transition to subcut insulin today.   9/13: propofol stopped yesterday. Added low dose fentanyl for comfort. Eeg placed and despite sedation wean and pt eyes opening neuro reports flat with poor prognosis for meaningful recovery.  9/12: no overnight events noted. LTM discontinued yesterday. Aed per neuro. Agree with stopping propofol and monitor for myoclonus/sz like activity. Family would like to await decision making until daughter returns back from trip to [place] (day 4 of 14 as of 9/12) 9/11:Hypertensive overnight, sometimes with stimulation. No seizure activity.    9/17: EEG flat off sedation   Interim History / Subjective:   Intubated awaiting family to return from [place]   Objective   Blood pressure (!) 126/43, pulse (!) 52, temperature (!) 97.5 F (36.4 C), temperature source Oral, resp. rate 18, height '5\' 10"'$  (1.778 m), weight 118.6 kg, SpO2 97 %.    Vent Mode: PRVC FiO2 (%):  [40 %] 40 % Set Rate:  [18 bmp] 18 bmp Vt Set:  [580 mL] 580 mL PEEP:  [5 cmH20] 5 cmH20 Plateau Pressure:  [24 cmH20-27 cmH20] 25 cmH20   Intake/Output Summary (Last 24 hours) at 11/23/2020 0947 Last data filed at 11/23/2020 0800 Gross per 24 hour  Intake 1673.2 ml  Output 810 ml  Net 863.2 ml   Filed Weights   11/21/20 0620 11/22/20 0500 11/23/20 0500  Weight: 119.7 kg 123 kg 118.6 kg    Examination: General appearance: 80 y.o., male, chroncially ill appearing  Eyes: corneals intact  HENT: cough/gag intact, ett in place  Lungs: BL vented breaths  CV: RRR, S1, S2, no MRGs  Abdomen: Soft, non-tender; non-distended, BS present  Extremities: BL amputee  Neuro: no response to painful stim   Resolved Hospital Problem list     Assessment & Plan:   In hospital cardiac arrest, prolonged; respiratory event/ probably aspiration thought to be culprit Anoxic brain injury confirmed on exam, MRI, EEG portends grim prognosis Anoxic myoclonus Acute kidney injury DM2 with hyperglycemia L fourth toe gangrene post left BKA- completed abx course Afib/HTN DNR  P: Fent for comfort  Continue free water  I suspect he will progress to brain death eventually  We will continue  to monitor neuro exam  Awaiting daughter to return from [place] for palliative withdrawal of care    Best Practice (right click and "Reselect all SmartList Selections" daily)   Diet/type: tubefeeds DVT prophylaxis: systemic heparin GI prophylaxis: PPI Lines: Central line Foley:  Yes, and it is still needed Code Status:  DNR Last date of multidisciplinary goals of care discussion: I  called number in chart this AM no answer   Garner Nash, [facility] 11/23/2020 10:12 AM

## 2020-11-23 NOTE — Progress Notes (Signed)
ANTICOAGULATION CONSULT NOTE  Pharmacy Consult for heparin Indication: atrial fibrillation  Allergies  Allergen Reactions   Prednisone Anaphylaxis and Shortness Of Breath   Dulaglutide Nausea Only   Atorvastatin    Lovastatin     Other reaction(s): OTHER REACTION    Patient Measurements: Height: '5\' 10"'$  (177.8 cm) Weight: 118.6 kg (261 lb 7.5 oz) IBW/kg (Calculated) : 73 Heparin Dosing Weight: 99kg  Vital Signs: Temp: 97.5 F (36.4 C) (09/17 0800) Temp Source: Oral (09/17 0800) BP: 126/52 (09/17 1000) Pulse Rate: 51 (09/17 1000)  Labs: Recent Labs    11/20/20 2245 11/21/20 0412 11/22/20 0529 11/23/20 0434  HGB 9.9* 9.5*  --  8.5*  HCT 29.0* 30.1*  --  26.7*  PLT  --  162  --  165  HEPARINUNFRC  --  0.30 0.53 0.52  CREATININE  --  2.95* 2.71* 3.11*     Estimated Creatinine Clearance: 24.4 mL/min (A) (by C-G formula based on SCr of 3.11 mg/dL (H)).   Assessment: 49 yoM on apixaban PTA for hx AF admitted with gangrenous toe s/p L BKA. Apixaban resumed 9/8, then pt suffered PEA arrest. Pharmacy asked to transition back to IV Heparin. CT Head negative for bleeding. Last dose 9/8 apixaban~2100 9/9  Heparin level remains therapeutic, CBC stable.  Goal of Therapy:  Heparin level 0.3-0.7 units/ml aPTT 66-102 seconds Monitor platelets by anticoagulation protocol: Yes   Plan:  Continue Heparin at 1450 units/h  Follow-up daily Heparin level and CBC    Arrie Senate, PharmD, [place], [facility] Clinical Pharmacist [phone] Please check AMION for all [place] numbers 11/23/2020

## 2020-11-23 NOTE — Progress Notes (Signed)
[place] Progress Note Patient Name: Patrick Shelton DOB: [birth year] MRN: [id number]   Date of Service  11/23/2020  HPI/Events of Note  Oliguria persists despite a 500cc bolus  eICU Interventions  No new orders; pt is declining; goals of care discussions are pending when family arrives from out of town     Intervention Category Intermediate Interventions: Oliguria - evaluation and management  Tilden Dome 11/23/2020, 6:35 AM

## 2020-11-23 NOTE — Progress Notes (Signed)
eLink Physician-Brief Progress Note Patient Name: Gaby Walenta DOB: [birth year] MRN: [id number]   Date of Service  11/23/2020  HPI/Events of Note  No change in MAP or other hemodynamics.  Crt stable.  Pt became oliguric.  eICU Interventions  500cc bolus fluid trial ordered.     Intervention Category Intermediate Interventions: Oliguria - evaluation and management  Tilden Dome 11/23/2020, 4:22 AM

## 2020-11-24 DIAGNOSIS — I469 Cardiac arrest, cause unspecified: Secondary | ICD-10-CM | POA: Diagnosis not present

## 2020-11-24 LAB — BASIC METABOLIC PANEL
Anion gap: 10 (ref 5–15)
BUN: 82 mg/dL — ABNORMAL HIGH (ref 8–23)
CO2: 18 mmol/L — ABNORMAL LOW (ref 22–32)
Calcium: 9 mg/dL (ref 8.9–10.3)
Chloride: 120 mmol/L — ABNORMAL HIGH (ref 98–111)
Creatinine, Ser: 3.23 mg/dL — ABNORMAL HIGH (ref 0.61–1.24)
GFR, Estimated: 19 mL/min — ABNORMAL LOW (ref >60)
Glucose, Bld: 155 mg/dL — ABNORMAL HIGH (ref 70–99)
Potassium: 4.8 mmol/L (ref 3.5–5.1)
Sodium: 148 mmol/L — ABNORMAL HIGH (ref 135–145)

## 2020-11-24 LAB — CBC
HCT: 27.3 % — ABNORMAL LOW (ref 39.0–52.0)
Hemoglobin: 8.6 g/dL — ABNORMAL LOW (ref 13.0–17.0)
MCH: 30.8 pg (ref 26.0–34.0)
MCHC: 31.5 g/dL (ref 30.0–36.0)
MCV: 97.8 fL (ref 80.0–100.0)
Platelets: 162 10*3/uL (ref 150–400)
RBC: 2.79 MIL/uL — ABNORMAL LOW (ref 4.22–5.81)
RDW: 17.6 % — ABNORMAL HIGH (ref 11.5–15.5)
WBC: 12.6 10*3/uL — ABNORMAL HIGH (ref 4.0–10.5)
nRBC: 0.2 % (ref 0.0–0.2)

## 2020-11-24 LAB — GLUCOSE, CAPILLARY
Glucose-Capillary: 171 mg/dL — ABNORMAL HIGH (ref 70–99)
Glucose-Capillary: 218 mg/dL — ABNORMAL HIGH (ref 70–99)
Glucose-Capillary: 250 mg/dL — ABNORMAL HIGH (ref 70–99)
Glucose-Capillary: 256 mg/dL — ABNORMAL HIGH (ref 70–99)
Glucose-Capillary: 284 mg/dL — ABNORMAL HIGH (ref 70–99)
Glucose-Capillary: 71 mg/dL (ref 70–99)

## 2020-11-24 LAB — HEPARIN LEVEL (UNFRACTIONATED): Heparin Unfractionated: 0.69 IU/mL (ref 0.30–0.70)

## 2020-11-24 MED ORDER — INSULIN ASPART 100 UNIT/ML IJ SOLN
10.0000 [IU] | INTRAMUSCULAR | Status: DC
  Administered 2020-11-25 – 2020-11-27 (×15): 10 [IU] via SUBCUTANEOUS

## 2020-11-24 MED ORDER — NOREPINEPHRINE 4 MG/250ML-% IV SOLN: 2.0000 ug/min | INTRAVENOUS | Status: DC

## 2020-11-24 MED ORDER — NOREPINEPHRINE 4 MG/250ML-% IV SOLN
0.0000 ug/min | INTRAVENOUS | Status: DC
  Administered 2020-11-24: 2 ug/min via INTRAVENOUS
  Filled 2020-11-24: qty 250

## 2020-11-24 MED ORDER — INSULIN ASPART 100 UNIT/ML IJ SOLN
8.0000 [IU] | INTRAMUSCULAR | Status: DC
  Administered 2020-11-24 (×4): 8 [IU] via SUBCUTANEOUS

## 2020-11-24 MED ORDER — APIXABAN 2.5 MG PO TABS
2.5000 mg | ORAL_TABLET | Freq: Two times a day (BID) | ORAL | Status: DC
Start: 2020-11-24 — End: 2020-11-28
  Administered 2020-11-24 – 2020-11-27 (×8): 2.5 mg
  Filled 2020-11-24 (×8): qty 1

## 2020-11-24 MED ORDER — SODIUM CHLORIDE 0.9 % IV SOLN: 250.0000 mL | INTRAVENOUS | Status: DC

## 2020-11-24 NOTE — Progress Notes (Signed)
ANTICOAGULATION CONSULT NOTE  Pharmacy Consult for heparin Indication: atrial fibrillation  Allergies  Allergen Reactions   Prednisone Anaphylaxis and Shortness Of Breath   Dulaglutide Nausea Only   Atorvastatin    Lovastatin     Other reaction(s): OTHER REACTION    Patient Measurements: Height: '5\' 10"'$  (177.8 cm) Weight: 124.1 kg (273 lb 9.5 oz) IBW/kg (Calculated) : 73 Heparin Dosing Weight: 99kg  Vital Signs: Temp: 97.6 F (36.4 C) (09/18 0730) Temp Source: Oral (09/18 0730) BP: 115/50 (09/18 0900) Pulse Rate: 51 (09/18 0900)  Labs: Recent Labs    11/22/20 0529 11/23/20 0434 11/24/20 0518  HGB  --  8.5* 8.6*  HCT  --  26.7* 27.3*  PLT  --  165 162  HEPARINUNFRC 0.53 0.52 0.69  CREATININE 2.71* 3.11* 3.23*     Estimated Creatinine Clearance: 24.1 mL/min (A) (by C-G formula based on SCr of 3.23 mg/dL (H)).   Assessment: 24 yoM on apixaban PTA for hx AF admitted with gangrenous toe s/p L BKA. Apixaban resumed 9/8, then pt suffered PEA arrest. Pharmacy asked to transition back to IV Heparin. CT Head negative for bleeding. Last dose 9/8 apixaban~2100 9/9  Heparin level remains therapeutic. Discussed with Dr. Valeta Harms - will transition back to apixaban for now while awaiting family arrival to minimize lab draws for heparin levels. Pt on '5mg'$  BID, will use 2.'5mg'$  BID with renal failure and age = 34yr  Goal of Therapy:  Heparin level 0.3-0.7 units/ml aPTT 66-102 seconds Monitor platelets by anticoagulation protocol: Yes   Plan:  Stop heparin Apixaban 2.'5mg'$  per tube BID Pharmacy will sign off  MArrie Senate PharmD, BCPS, [facility] LLCClinical Pharmacist [phone] check AMION for all [facility] 11/24/2020

## 2020-11-24 NOTE — Progress Notes (Signed)
eLink Physician-Brief Progress Note Patient Name: Mauri Ledon DOB: 1940-08-08 MRN: [id number]   Date of Service  11/24/2020  HPI/Events of Note  CBG 38, nurse held insulin.  Pt is on Tube feeds at 55 (goal).  He is on Levemir 48 units every 12, novolog 13 units very 4, and sliding scale every 4.   UOP is only 100cc for the shift, nurse did bladder scan and it's showing 244, pt is wearing condom cath  Discussed with RN. 17 th creatinine was worsening. AM BMP pending still.  ABI AKI Left toe gangrene.  Leaning towards comfort care.   eICU Interventions  - hold lantus  - watch for hypoglycemia. - decrease novolog to 8 units q4 hrs.  - MAP some times soft.  DNR.  if low MAP need levophed.      Intervention Category Intermediate Interventions: Other:  Elmer Sow 11/24/2020, 4:11 AM

## 2020-11-24 NOTE — Progress Notes (Signed)
eLink Physician-Brief Progress Note Patient Name: Patrick Shelton DOB: 12-06-1940 MRN: [id number]   Date of Service  11/24/2020  HPI/Events of Note  CBG running BG > 200 all day. Cr 32 On TF. On 48 levemir q12. On ssi, novolog.   eICU Interventions  - increase q4 short acting insulin from 8 to 10. Goal to keep CBG < 180.      Intervention Category Intermediate Interventions: Hyperglycemia - evaluation and treatment  Elmer Sow 11/24/2020, 8:13 PM

## 2020-11-24 NOTE — Progress Notes (Signed)
NAME:  Patrick Shelton, MRN:  [id number], DOB:  1941/01/28, LOS: 72 ADMISSION DATE:  10/31/2020, CONSULTATION DATE:  11/15/2020 REFERRING MD:  Dr. 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 Vascular consulted. 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 Stay complicated by acute on chronic renal failure, hyperkalemia, encephalopathy, and respiratory distress.   9/8 patient with hypoxia and labored breathing with productive cough with white clear frothy sputum. Given lasix. 9/9 patient found unresponsive and pulseless. PEA. Required 15 minutes of CPR, 1 Calcium, 4 EPI, and 1 sodium Bicarb prior to ROSC. On arrival to ICU patient is hypotensive, bradycardiac, intubated, and unresponsive.   Pertinent  Medical History  DM, HTN, HLD, PAD, A.Fib on Eliquis, s/p Right BKA, now Left BKA   Significant Hospital Events: Including procedures, antibiotic start and stop dates in addition to other pertinent events   9/1 Presents to ED with suspected osteo of 4th toe to LLE  9/6 L BKA 9/9 early am cardiac arrest, prolonged, intubated, on pressors 9/15: no acute overnight events. Remains unresponsive.   9/14: remains unresponsive on vent. Able to tolerate sbt with increased PS however for short period. Renal function stable as is wbc. Able to transition to subcut insulin today.   9/13: propofol stopped yesterday. Added low dose fentanyl for comfort. Eeg placed and despite sedation wean and pt eyes opening neuro reports flat with poor prognosis for meaningful recovery.  9/12: no overnight events noted. LTM discontinued yesterday. Aed per neuro. Agree with stopping propofol and monitor for myoclonus/sz like activity. Family would like to await decision making until daughter returns back from trip to [place] (day 4 of 14 as of 9/12) 9/11:Hypertensive overnight, sometimes with stimulation. No seizure activity.    9/17: EEG flat off sedation   Interim History / Subjective:   Remains intubated, waiting for family to return from trip to [place].  Objective   Blood pressure (!) 115/50, pulse (!) 51, temperature 97.6 F (36.4 C), temperature source Oral, resp. rate 18, height '5\' 10"'$  (1.778 m), weight 124.1 kg, SpO2 97 %.    Vent Mode: PRVC FiO2 (%):  [40 %] 40 % Set Rate:  [18 bmp] 18 bmp Vt Set:  [580 mL] 580 mL PEEP:  [5 cmH20] 5 cmH20 Plateau Pressure:  [24 cmH20-25 cmH20] 25 cmH20   Intake/Output Summary (Last 24 hours) at 11/24/2020 1007 Last data filed at 11/24/2020 0800 Gross per 24 hour  Intake 1761.5 ml  Output 1850 ml  Net -88.5 ml   Filed Weights   11/22/20 0500 11/23/20 0500 11/24/20 0600  Weight: 123 kg 118.6 kg 124.1 kg    Examination: General appearance: 80 y.o., male, chronically ill-appearing intubated on life support Eyes: Corneals intact HENT: Sedated on propofol this morning, no cough or gag on exam Lungs: Bilateral ventilated breath sounds CV: Regular rhythm, bradycardic, S1-S2 no MRG Abdomen: Soft nontender nondistended Extremities: Bilateral amputee Neuro: No response to painful stimuli  Resolved Hospital Problem list     Assessment & Plan:   In hospital cardiac arrest, prolonged; respiratory event/ probably aspiration thought to be culprit Anoxic brain injury confirmed on exam, MRI, EEG, poor prognosis Anoxic myoclonus Acute kidney injury 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 Hopefully this will occur this week. However I do not suspect that he will survive much longer in this state. I will reach out again to the patient's wife today. No escalation of care, no pressors.   Best  Practice (right click and "Reselect all SmartList Selections" daily)   Diet/type: tubefeeds DVT prophylaxis: systemic heparin GI prophylaxis: PPI Lines: Central line Foley:  Yes, and it is still needed Code Status:  DNR Last date of multidisciplinary goals of care discussion: I called number in chart this AM no answer   Garner Nash, DO [place] [facility] 11/24/2020 10:07 AM

## 2020-11-25 DIAGNOSIS — I469 Cardiac arrest, cause unspecified: Secondary | ICD-10-CM | POA: Diagnosis not present

## 2020-11-25 LAB — CBC
HCT: 28.1 % — ABNORMAL LOW (ref 39.0–52.0)
Hemoglobin: 8.9 g/dL — ABNORMAL LOW (ref 13.0–17.0)
MCH: 31.2 pg (ref 26.0–34.0)
MCHC: 31.7 g/dL (ref 30.0–36.0)
MCV: 98.6 fL (ref 80.0–100.0)
Platelets: 160 10*3/uL (ref 150–400)
RBC: 2.85 MIL/uL — ABNORMAL LOW (ref 4.22–5.81)
RDW: 18.2 % — ABNORMAL HIGH (ref 11.5–15.5)
WBC: 10.2 10*3/uL (ref 4.0–10.5)
nRBC: 0.2 % (ref 0.0–0.2)

## 2020-11-25 LAB — GLUCOSE, CAPILLARY
Glucose-Capillary: 259 mg/dL — ABNORMAL HIGH (ref 70–99)
Glucose-Capillary: 269 mg/dL — ABNORMAL HIGH (ref 70–99)
Glucose-Capillary: 270 mg/dL — ABNORMAL HIGH (ref 70–99)
Glucose-Capillary: 285 mg/dL — ABNORMAL HIGH (ref 70–99)
Glucose-Capillary: 295 mg/dL — ABNORMAL HIGH (ref 70–99)
Glucose-Capillary: 301 mg/dL — ABNORMAL HIGH (ref 70–99)

## 2020-11-25 MED ORDER — INSULIN ASPART 100 UNIT/ML IJ SOLN
0.0000 [IU] | INTRAMUSCULAR | Status: DC
  Administered 2020-11-25: 11 [IU] via SUBCUTANEOUS
  Administered 2020-11-25 (×2): 8 [IU] via SUBCUTANEOUS
  Administered 2020-11-26: 5 [IU] via SUBCUTANEOUS
  Administered 2020-11-26: 8 [IU] via SUBCUTANEOUS
  Administered 2020-11-26: 5 [IU] via SUBCUTANEOUS
  Administered 2020-11-26 (×3): 8 [IU] via SUBCUTANEOUS
  Administered 2020-11-26: 3 [IU] via SUBCUTANEOUS
  Administered 2020-11-27: 15 [IU] via SUBCUTANEOUS
  Administered 2020-11-27: 5 [IU] via SUBCUTANEOUS
  Administered 2020-11-27 – 2020-11-28 (×5): 15 [IU] via SUBCUTANEOUS

## 2020-11-25 NOTE — Progress Notes (Signed)
Inpatient Diabetes Program Recommendations  AACE/ADA: New Consensus Statement on Inpatient Glycemic Control (2015)  Target Ranges:  Prepandial:   less than 140 mg/dL      Peak postprandial:   less than 180 mg/dL (1-2 hours)      Critically ill patients:  140 - 180 mg/dL  Results for JANSSEN, ZEE (MRN [id number]) as of 11/25/2020 07:38  Ref. Range 11/23/2020 23:31 11/24/2020 03:28 11/24/2020 07:29 11/24/2020 11:28 11/24/2020 15:34 11/24/2020 19:41 11/24/2020 23:30 11/25/2020 03:46  Glucose-Capillary Latest Ref Range: 70 - 99 mg/dL 133 (H) 71 171 (H) 250 (H) 218 (H) 256 (H) 284 (H) 259 (H)     Home DM Meds: Medtronic 630G insulin pump and Dexcom CGM Insulin pump settings: (10/04/20) 00:00 3.85 U/hr.  0900  4.25 U/hr.  15:00  4.45 U/hr.  Total: 100.2 units basal  Bolus ICR: 3.0  ISF:8  Target glucose: 100-150 mg/dl   Current Orders: Levemir 48 units BID     Novolog 2-4-6 units Q4 hours     Novolog 10 units Q4 hours    MD- Note CBGs on the rise since 12pm yesterday afternoon  Note Novolog tube feed coverage increased to 10 units Q4 hours at [place] last PM  If this trend continues, and within goals of care for pt, please consider:  1. Increasing the Novolog SSi to the 3-6-9 unit scale per the ICU Glycemic Control order set  2. Increasing the Novolog tube feed coverage to 14 units Q4 hours    --Will follow patient during hospitalization--  Wyn Quaker RN, MSN, CDE Diabetes Coordinator Inpatient Glycemic Control Team Team Pager: [phone] (8a-5p)

## 2020-11-25 NOTE — Progress Notes (Signed)
   NAME:  Patrick Shelton, MRN:  [id number], DOB:  1940/09/06, LOS: 3 ADMISSION DATE:  10/31/2020, CONSULTATION DATE:  11/15/2020 REFERRING MD:  Dr. Tonie Griffith, CHIEF COMPLAINT:  Cardiac Arrest    History of Present Illness:  80 year old male presents to ED on 0/96 with complicated gangrene of Left 4th toe. Vascular consulted. 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 Stay complicated by acute on chronic renal failure, hyperkalemia, encephalopathy, and respiratory distress.   9/8 patient with hypoxia and labored breathing with productive cough with white clear frothy sputum. Given lasix. 9/9 patient found unresponsive and pulseless. PEA. Required 15 minutes of CPR, 1 Calcium, 4 EPI, and 1 sodium Bicarb prior to ROSC. On arrival to ICU patient is hypotensive, bradycardiac, intubated, and unresponsive.   Pertinent  Medical History  DM, HTN, HLD, PAD, A.Fib on Eliquis, s/p Right BKA, now Left BKA   Significant Hospital Events: Including procedures, antibiotic start and stop dates in addition to other pertinent events   9/1 Presents to ED with suspected osteo of 4th toe to LLE  9/6 L BKA 9/9 early am cardiac arrest, prolonged, intubated, on pressors Onwards: MRI confirms severe anoxic injury, DNR, awaiting family from [place]  Interim History / Subjective:   Remains intubated, waiting for family to return from trip to [place].  Objective   Blood pressure (!) 116/48, pulse (!) 47, temperature 97.6 F (36.4 C), temperature source Oral, resp. rate 18, height 5\' 10"  (1.778 m), weight 125.3 kg, SpO2 96 %.    Vent Mode: PRVC FiO2 (%):  [40 %] 40 % Set Rate:  [8 bmp-18 bmp] 18 bmp Vt Set:  [580 mL] 580 mL PEEP:  [5 cmH20] 5 cmH20 Plateau Pressure:  [14 cmH20-28 cmH20] 28 cmH20   Intake/Output Summary (Last 24 hours) at 11/25/2020 0825 Last data filed at 11/25/2020 0700 Gross per 24 hour  Intake 2659.51 ml  Output 955 ml  Net 1704.51 ml    Filed Weights   11/23/20 0500 11/24/20 0600 11/25/20 0422  Weight:  118.6 kg 124.1 kg 125.3 kg    Examination: Comatose man on vent Some rhythmic L eyelid twitching/myoclonus Pupils reactive GCS3 On heavier sedation to reduce myoclonus; underlying EEG flat with sedation holiday  Resolved Hospital Problem list     Assessment & Plan:   In hospital cardiac arrest, prolonged; respiratory event/ probably aspiration thought to be culprit Anoxic brain injury confirmed on exam, MRI, EEG, poor prognosis Anoxic myoclonus Acute kidney injury 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

## 2020-11-26 DIAGNOSIS — I469 Cardiac arrest, cause unspecified: Secondary | ICD-10-CM | POA: Diagnosis not present

## 2020-11-26 DIAGNOSIS — Z20822 Contact with and (suspected) exposure to covid-19: Secondary | ICD-10-CM | POA: Diagnosis not present

## 2020-11-26 LAB — GLUCOSE, CAPILLARY
Glucose-Capillary: 250 mg/dL — ABNORMAL HIGH (ref 70–99)
Glucose-Capillary: 263 mg/dL — ABNORMAL HIGH (ref 70–99)
Glucose-Capillary: 276 mg/dL — ABNORMAL HIGH (ref 70–99)
Glucose-Capillary: 262 mg/dL — ABNORMAL HIGH (ref 70–99)
Glucose-Capillary: 200 mg/dL — ABNORMAL HIGH (ref 70–99)
Glucose-Capillary: 203 mg/dL — ABNORMAL HIGH (ref 70–99)

## 2020-11-26 MED ORDER — NOREPINEPHRINE 4 MG/250ML-% IV SOLN
0.0000 ug/min | INTRAVENOUS | Status: DC
  Administered 2020-11-26: 2 ug/min via INTRAVENOUS
  Administered 2020-11-27: 3 ug/min via INTRAVENOUS
  Filled 2020-11-26 (×2): qty 250

## 2020-11-26 MED ORDER — INSULIN DETEMIR 100 UNIT/ML ~~LOC~~ SOLN
15.0000 [IU] | Freq: Once | SUBCUTANEOUS | Status: AC
  Administered 2020-11-26: 15 [IU] via SUBCUTANEOUS
  Filled 2020-11-26: qty 0.15

## 2020-11-26 MED ORDER — INSULIN DETEMIR 100 UNIT/ML ~~LOC~~ SOLN
60.0000 [IU] | Freq: Two times a day (BID) | SUBCUTANEOUS | Status: DC
  Administered 2020-11-26 – 2020-11-27 (×3): 60 [IU] via SUBCUTANEOUS
  Filled 2020-11-26 (×5): qty 0.6

## 2020-11-26 NOTE — Plan of Care (Signed)
  Problem: Clinical Measurements: Goal: Respiratory complications will improve Outcome: Progressing Goal: Cardiovascular complication will be avoided Outcome: Progressing   Problem: Nutrition: Goal: Adequate nutrition will be maintained Outcome: Progressing   Problem: Elimination: Goal: Will not experience complications related to bowel motility Outcome: Progressing   Problem: Activity: Goal: Ability to tolerate increased activity will improve Outcome: Not Progressing   Problem: Respiratory: Goal: Ability to maintain a clear airway and adequate ventilation will improve Outcome: Not Progressing

## 2020-11-26 NOTE — Progress Notes (Signed)
   NAME:  Patrick Shelton, MRN:  [id number], DOB:  02/14/1941, LOS: 25 ADMISSION DATE:  10/08/2020, CONSULTATION DATE:  11/15/2020 REFERRING MD:  Dr. Tonie Griffith, CHIEF COMPLAINT:  Cardiac Arrest    History of Present Illness:  80 year old male presents to ED on 4/33 with complicated gangrene of Left 4th toe. Vascular consulted. 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 Stay complicated by acute on chronic renal failure, hyperkalemia, encephalopathy, and respiratory distress.   9/8 patient with hypoxia and labored breathing with productive cough with white clear frothy sputum. Given lasix. 9/9 patient found unresponsive and pulseless. PEA. Required 15 minutes of CPR, 1 Calcium, 4 EPI, and 1 sodium Bicarb prior to ROSC. On arrival to ICU patient is hypotensive, bradycardiac, intubated, and unresponsive.   Pertinent  Medical History  DM, HTN, HLD, PAD, A.Fib on Eliquis, s/p Right BKA, now Left BKA   Significant Hospital Events: Including procedures, antibiotic start and stop dates in addition to other pertinent events   9/1 Presents to ED with suspected osteo of 4th toe to LLE  9/6 L BKA 9/9 early am cardiac arrest, prolonged, intubated, on pressors Onwards: MRI confirms severe anoxic injury, DNR, awaiting family from [place]  Interim History / Subjective:  Sedation lightened this AM due to hypotension, he will decorticate posture to stimulation.    Objective   Blood pressure (!) 108/51, pulse (!) 46, temperature (!) 96.2 F (35.7 C), temperature source Axillary, resp. rate 17, height 5\' 10"  (1.778 m), weight 128.3 kg, SpO2 97 %.    Vent Mode: PRVC FiO2 (%):  [40 %] 40 % Set Rate:  [8 bmp-18 bmp] 8 bmp Vt Set:  [560 mL-580 mL] 580 mL PEEP:  [5 cmH20] 5 cmH20 Plateau Pressure:  [27 cmH20-30 cmH20] 28 cmH20   Intake/Output Summary (Last 24 hours) at 11/26/2020 0850 Last data filed at 11/26/2020 0815 Gross per 24 hour  Intake 3897.75 ml  Output 950 ml  Net 2947.75 ml    Filed Weights   11/24/20 0600  11/25/20 0422 11/26/20 0500  Weight: 124.1 kg 125.3 kg 128.3 kg    Examination: NAD Eyes open, not tracking, pupils reactive Heart sounds bradycardic Occasional decorticate posturing to stimulation Copious oral secretions Abdomen soft with hypoactive BS Ext with trace edema   Resolved Hospital Problem list     Assessment & Plan:   In hospital cardiac arrest, prolonged; respiratory event/ probably aspiration thought to be culprit Anoxic brain injury confirmed on exam, MRI, EEG, poor prognosis Anoxic myoclonus Acute kidney injury 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

## 2020-11-26 NOTE — Progress Notes (Signed)
Nutrition Follow-up  DOCUMENTATION CODES:   Obesity unspecified  INTERVENTION:   Plan withdrawal of care on Thursday, continue TF until then  -Vital 1.5 @ 55 ml/hr via OG (1320 ml) -ProSource TF 45 ml TID -Free water flushes 300 ml Q6 hours   Provides: 2100 kcals, 122 grams protein, 1008 ml free water (2208 ml with flushes).   NUTRITION DIAGNOSIS:   Increased nutrient needs related to wound healing as evidenced by estimated needs.  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  9/01 Admitted 9/06 L BKA 9/09 Cardiac Arrest, Intubated, Shock requiring pressors  MRI confirms anoxic brain injury. Requiring levophed. Propofol added. Creatinine trending up. Tolerating tube feeding at goal. Stool output decreased. Plan for no escalation of care, awaiting family arrival on Thursday to withdrawal care.   Admission weight: 117.9 kg Current weight: 128.3 kg   UOP: 850 ml x 24 hrs  Stool: 100 ml x 24 hrs   Drips: levophed, propofol (18.6 ml/hr) Medications: Vitamin D, colace, SS novolog, levemir, miralax Labs: Na 148 (H) Cr 3.23- trending up CBG 71-284  Diet Order:   Diet Order             Diet NPO time specified  Diet effective now                   EDUCATION NEEDS:   Education needs have been addressed  Skin:  Skin Assessment: Skin Integrity Issues: Skin Integrity Issues:: Incisions Incisions: L BKA 9/6  Last BM:  9/20  Height:   Ht Readings from Last 1 Encounters:  11/15/20 5\' 10"  (1.778 m)     Weight:   Wt Readings from Last 1 Encounters:  11/26/20 128.3 kg    BMI:  Body mass index is 40.58 kg/m.  Estimated Nutritional Needs:   Kcal:  1900-2100  Protein:  115-145 gm  Fluid:  > 1.9 L/day  Mariana Single MS, RD, LDN, CNSC Clinical Nutrition Pager listed in [place]

## 2020-11-27 DIAGNOSIS — I469 Cardiac arrest, cause unspecified: Secondary | ICD-10-CM | POA: Diagnosis not present

## 2020-11-27 LAB — GLUCOSE, CAPILLARY
Glucose-Capillary: 217 mg/dL — ABNORMAL HIGH (ref 70–99)
Glucose-Capillary: 383 mg/dL — ABNORMAL HIGH (ref 70–99)
Glucose-Capillary: 362 mg/dL — ABNORMAL HIGH (ref 70–99)
Glucose-Capillary: 351 mg/dL — ABNORMAL HIGH (ref 70–99)
Glucose-Capillary: 358 mg/dL — ABNORMAL HIGH (ref 70–99)

## 2020-11-27 MED ORDER — INSULIN ASPART 100 UNIT/ML IJ SOLN
14.0000 [IU] | INTRAMUSCULAR | Status: DC
  Administered 2020-11-27 – 2020-11-28 (×5): 14 [IU] via SUBCUTANEOUS

## 2020-11-27 NOTE — Progress Notes (Signed)
Inpatient Diabetes Program Recommendations  AACE/ADA: New Consensus Statement on Inpatient Glycemic Control (2015)  Target Ranges:  Prepandial:   less than 140 mg/dL      Peak postprandial:   less than 180 mg/dL (1-2 hours)      Critically ill patients:  140 - 180 mg/dL   Lab Results  Component Value Date   GLUCAP 383 (H) 11/27/2020   HGBA1C 7.2 (H) 11/13/2020    Review of Glycemic Control Results for Patrick Shelton, Patrick Shelton (MRN [id number]) as of 11/27/2020 09:42  Ref. Range 11/26/2020 07:47 11/26/2020 11:31 11/26/2020 15:26 11/26/2020 20:02 11/26/2020 23:29 11/27/2020 03:14 11/27/2020 07:50  Glucose-Capillary Latest Ref Range: 70 - 99 mg/dL 263 (H) 276 (H) 262 (H) 200 (H) 203 (H) 217 (H) 383 (H)   Inpatient Diabetes Program Recommendations:   -Please consider increasing the Novolog tube feed coverage to 14 units Q4 hours Secure chat to Dr. Tamala Julian.  Thank you, Nani Gasser. Cirilo Canner, RN, MSN, CDE  Diabetes Coordinator Inpatient Glycemic Control Team Team Pager [phone] (8am-5pm) 11/27/2020 9:44 AM

## 2020-11-27 NOTE — Progress Notes (Signed)
   NAME:  Patrick Shelton, MRN:  [id number], DOB:  [birth year], LOS: 67 ADMISSION DATE:  10/19/2020, CONSULTATION DATE:  11/15/2020 REFERRING MD:  Dr. Tonie Griffith, CHIEF COMPLAINT:  Cardiac Arrest    History of Present Illness:  80 year old male presents to ED on 9/82 with complicated gangrene of Left 4th toe. Vascular consulted. 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 Stay complicated by acute on chronic renal failure, hyperkalemia, encephalopathy, and respiratory distress.   9/8 patient with hypoxia and labored breathing with productive cough with white clear frothy sputum. Given lasix. 9/9 patient found unresponsive and pulseless. PEA. Required 15 minutes of CPR, 1 Calcium, 4 EPI, and 1 sodium Bicarb prior to ROSC. On arrival to ICU patient is hypotensive, bradycardiac, intubated, and unresponsive.   Pertinent  Medical History  DM, HTN, HLD, PAD, A.Fib on Eliquis, s/p Right BKA, now Left BKA   Significant Hospital Events: Including procedures, antibiotic start and stop dates in addition to other pertinent events   9/1 Presents to ED with suspected osteo of 4th toe to LLE  9/6 L BKA 9/9 early am cardiac arrest, prolonged, intubated, on pressors Onwards: MRI confirms severe anoxic injury, DNR, awaiting family from [place]  Interim History / Subjective:   No events, remains comatose on vent.  Objective   Blood pressure (!) 122/41, pulse 67, temperature 98.2 F (36.8 C), resp. rate 18, height 5\' 10"  (1.778 m), weight 128.3 kg, SpO2 95 %.    Vent Mode: PRVC FiO2 (%):  [40 %] 40 % Set Rate:  [18 bmp] 18 bmp Vt Set:  [580 mL] 580 mL PEEP:  [5 cmH20] 5 cmH20 Plateau Pressure:  [18 cmH20-31 cmH20] 28 cmH20   Intake/Output Summary (Last 24 hours) at 11/27/2020 6415 Last data filed at 11/27/2020 0700 Gross per 24 hour  Intake 1118.04 ml  Output 630 ml  Net 488.04 ml    Filed Weights   11/24/20 0600 11/25/20 0422 11/26/20 0500  Weight: 124.1 kg 125.3 kg 128.3 kg    Examination: Comatose on  vent Some brainstem reflexes remain, similar to prior exams Lungs clear  Resolved Hospital Problem list     Assessment & Plan:   In hospital cardiac arrest, prolonged; respiratory event/ probably aspiration thought to be culprit Anoxic brain injury confirmed on exam, MRI, EEG, poor prognosis Anoxic myoclonus Acute kidney injury DM2 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

## 2020-11-28 LAB — GLUCOSE, CAPILLARY
Glucose-Capillary: 392 mg/dL — ABNORMAL HIGH (ref 70–99)
Glucose-Capillary: 408 mg/dL — ABNORMAL HIGH (ref 70–99)
Glucose-Capillary: 495 mg/dL — ABNORMAL HIGH (ref 70–99)

## 2020-11-28 MED ORDER — SODIUM CHLORIDE 0.9% FLUSH
10.0000 mL | Freq: Two times a day (BID) | INTRAVENOUS | Status: DC
  Administered 2020-11-28: 10 mL

## 2020-11-28 MED ORDER — SODIUM CHLORIDE 0.9% FLUSH: 10.0000 mL | INTRAVENOUS | Status: DC | PRN

## 2020-12-07 NOTE — Death Summary Note (Addendum)
DEATH SUMMARY   Patient Details  Name: Patrick Shelton MRN: [id number] DOB: [birth year]  Admission/Discharge Information   Admit Date:  2020-11-07  Date of Death:  11/29/2020  Time of Death:  08:05  Length of Stay: 21  Referring Physician: Lorne Skeens, MD   Reason(s) for Hospitalization  Gangrene left toe Acute on chronic renal failure Hyperkalemia Acute metabolic encephalopathy Acute on chronic renal failure In hospital cardiac arrest thought due to aspiration Severe anoxic brain injury DM HTN HLD PAD Afib on eliquis   Brief Hospital Course (including significant findings, care, treatment, and services provided and events leading to death)  80 year old male presents to ED on 1/95 with complicated gangrene of Left 4th toe. Vascular consulted. 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 Stay complicated by acute on chronic renal failure, hyperkalemia, encephalopathy, and respiratory distress.    9/8 patient with hypoxia and labored breathing with productive cough with white clear frothy sputum. Given lasix. 9/9 patient found unresponsive and pulseless. PEA. Required 15 minutes of CPR, 1 Calcium, 4 EPI, and 1 sodium Bicarb prior to ROSC. On arrival to ICU patient is hypotensive, bradycardiac, intubated, and unresponsive. Inciting event thought to be aspiration event.  Patient underwent targeted temperature management, antibiotics, mechanical ventilation and supportive care.  Unfortunately subsequent exam and imaging revealed profound anoxic brain injury.  Family agreed patient should be allowed to pass without chest compressions and shocks.  We continued supportive care [place] his [place] but he passed before she could arrive.   Pertinent Labs and Studies  Significant Diagnostic Studies CT ABDOMEN PELVIS WO CONTRAST  Result Date: 11/15/2020 CLINICAL DATA:  80 year old male with sepsis. EXAM: CT ABDOMEN AND PELVIS WITHOUT CONTRAST TECHNIQUE: Multidetector CT  imaging of the abdomen and pelvis was performed following the standard protocol without IV contrast. COMPARISON:  [facility] CT Abdomen and Pelvis 07/09/2008. CTA chest 10/16/2010. FINDINGS: Lower chest: Streak artifact from the patient's arms. But bilateral lower lobe consolidation with air bronchograms is evident. Questionable superimposed pleural fluid, difficult to confirm given the artifact. Cardiac size is stable since 2010. No pericardial effusion. Hepatobiliary: Vicarious contrast excretion versus gallstones and/or sludge within the lumen of the gallbladder (series 3, image 30) which is otherwise partially contracted. No pericholecystic inflammation. Negative noncontrast liver parenchyma. Pancreas: Negative. Spleen: Negative. Adrenals/Urinary Tract: Normal adrenal glands. No hydronephrosis, but there is bilateral fairly symmetric pararenal fluid/stranding. Renal vascular calcifications. Difficult to exclude nephrolithiasis. But both ureters are decompressed. The bladder is thick-walled, but decompressed. There is mild perivesical inflammatory stranding as seen on series 3, image 83 and sagittal image 100. Stomach/Bowel: No dilated large or small bowel. Oral contrast is present in the colon to the rectum. Normal appendix on series 3, image 68. No large bowel wall thickening. Decompressed and negative terminal ileum. Enteric tube courses through the esophagus and terminates in the gastric body. Small bowel loops are within normal limits. Chronic postoperative changes to the midline ventral abdominal wall. No free air. No free fluid. Vascular/Lymphatic: Aortoiliac calcified atherosclerosis. Normal caliber abdominal aorta. Left femoral approach arterial and venous catheters. Vascular patency is not evaluated in the absence of IV contrast. No lymphadenopathy. Reproductive: Negative. Other: Mild presacral stranding. No pelvic free fluid. Bilateral flank body wall edema. Musculoskeletal: Minimally  displaced anterior rib fractures appear age indeterminate, affecting the bilateral anterior 6th and 7th ribs. No other acute osseous abnormality identified. 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 IMPRESSION: 1. Lung bases  partially obscured by streak artifact but bilateral lower lobe consolidation is visible and suspicious for Bilateral Pneumonia. 2. Decompressed urinary bladder with surrounding inflammatory stranding raising the possibility of UTI. Symmetric bilateral pararenal fluid/stranding is nonspecific but given the absence of obstructive uropathy might indicate acute intrinsic renal disease. 3. Age indeterminate bilateral anterior 6th and 7th rib fractures. 4. Vicarious excretion of contrast versus gallstones and/or sludge within the gallbladder lumen. But no CT evidence of acute cholecystitis. 5. No bowel obstruction or inflammation, normal appendix. Enteric tube terminates in the stomach. 6. Aortic Atherosclerosis (ICD10-I70.0). Left femoral approach arterial and venous catheters in place. Electronically Signed   By: Genevie Ann M.D.   On: 11/15/2020 04:02   DG Chest 2 View  Addendum Date: 11/04/2020   ADDENDUM REPORT: 10/21/2020 22:46 ADDENDUM: The accession for a concomitant chest radiograph was mistakenly linked to the foot report. Two-view chest x-ray shows shallow lung inflation with bilateral basilar predominant opacities, likely atelectasis or mild pulmonary edema. There is mild cardiomegaly. No pleural effusion. Electronically Signed   By: Ulyses Jarred M.D.   On: 10/24/2020 22:46   Result Date: 10/15/2020 CLINICAL DATA:  Shortness of breath and left toe pain EXAM: CHEST - 2 VIEW; LEFT FOOT - COMPLETE 3+ VIEW COMPARISON:  1722 FINDINGS: Unchanged appearance the distal phalanx of the left fourth toe, with mild fragmentation. Advanced vascular calcification. No acute fracture or dislocation. IMPRESSION: Unchanged appearance of the distal  phalanx of the left fourth toe, with mild fragmentation. No progressive osteolysis. Electronically Signed: By: Ulyses Jarred M.D. On: 10/29/2020 21:16   DG Abd 1 View  Result Date: 11/21/2020 CLINICAL DATA:  Ileus, wound infection EXAM: ABDOMEN - 1 VIEW COMPARISON:  11/15/2020 FINDINGS: 2 supine frontal views of the abdomen and pelvis are obtained, limited by portable technique and body habitus. No bowel obstruction or ileus. Enteric catheter tip projects over gastric body. No abdominal masses or abnormal calcifications. Acute right lateral fifth rib fracture. Left femoral catheter tip overlies the left sacral ala. IMPRESSION: 1. Unremarkable bowel gas pattern. 2. Acute displaced right lateral fifth rib fracture. Electronically Signed   By: Randa Ngo M.D.   On: 11/21/2020 15:09   CT HEAD WO CONTRAST (5MM)  Result Date: 11/15/2020 CLINICAL DATA:  Mental status change, unknown cause EXAM: CT HEAD WITHOUT CONTRAST TECHNIQUE: Contiguous axial images were obtained from the base of the skull through the vertex without intravenous contrast. COMPARISON:  11/13/2020 FINDINGS: Brain: Stable left middle cranial fossa cyst. There is atrophy and chronic small vessel disease changes. No acute intracranial abnormality. Specifically, no hemorrhage, hydrocephalus, acute infarction, or significant intracranial injury. Vascular: No hyperdense vessel or unexpected calcification. Skull: No acute calvarial abnormality. Sinuses/Orbits: No acute findings Other: None IMPRESSION: Atrophy, chronic microvascular disease. No acute intracranial abnormality. Electronically Signed   By: Rolm Baptise M.D.   On: 11/15/2020 03:49   CT HEAD WO CONTRAST (5MM)  Result Date: 11/13/2020 CLINICAL DATA:  Delirium. EXAM: CT HEAD WITHOUT CONTRAST TECHNIQUE: Contiguous axial images were obtained from the base of the skull through the vertex without intravenous contrast. COMPARISON:  None. FINDINGS: Brain: Age related cerebral atrophy,  ventriculomegaly and periventricular white matter disease. Moderate-sized left middle cranial fossa cyst is noted. No CT findings for acute hemispheric infarction or intracranial hemorrhage. No mass lesions. The brainstem and cerebellum are normal. Vascular: Advanced vascular calcifications but no aneurysm or hyperdense vessels. Skull: No skull fracture or bone lesions. Sinuses/Orbits: The paranasal sinuses and mastoid air cells are clear. The  globes are intact. Other: No scalp lesions or scalp hematoma. IMPRESSION: 1. Age related cerebral atrophy, ventriculomegaly and periventricular white matter disease. 2. Left middle cranial fossa cyst. 3. No acute intracranial findings or mass lesions. Electronically Signed   By: Marijo Sanes M.D.   On: 11/13/2020 06:02   MR BRAIN WO CONTRAST  Result Date: 11/17/2020 CLINICAL DATA:  Initial evaluation for anoxic brain injury. EXAM: MRI HEAD WITHOUT CONTRAST TECHNIQUE: Multiplanar, multiecho pulse sequences of the brain and surrounding structures were obtained without intravenous contrast. COMPARISON:  Prior head CT from 11/15/2020. FINDINGS: Brain: Diffuse prominence of the CSF containing spaces compatible with generalized cerebral atrophy, moderately advanced in nature. There is diffuse symmetric abnormal diffusion signal abnormality involving the cortical gray matter of both cerebral hemispheres. Symmetric involvement of the deep gray nuclei including the caudate and lentiform nuclei and thalami. Relative sparing of the cerebellum and brainstem. Findings consistent with anoxic injury. No significant mass effect. No acute intracranial hemorrhage. Single punctate chronic microhemorrhage overlies the left frontal convexity, of doubtful significance in isolation. No other evidence for acute or subacute vascular infarct. 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 No other mass lesion, mass effect or midline shift. No hydrocephalus or extra-axial fluid  collection. Pituitary gland suprasellar region normal. Midline structures intact. Vascular: Major intracranial vascular flow voids are maintained. Skull and upper cervical spine: Craniocervical junction within normal limits. Bone marrow signal heterogeneous without focal marrow replacing lesion. No scalp soft tissue abnormality. Sinuses/Orbits: Prior bilateral ocular lens replacement. Scattered mucosal thickening noted throughout the paranasal sinuses. Fluid seen within the nasopharynx. Patient is intubated. Small to moderate bilateral mastoid effusions. Other: None. IMPRESSION: 1. Findings consistent with diffuse anoxic injury involving the cortical gray matter of both cerebral hemispheres as well as the deep gray nuclei. Relative sparing of the brainstem and cerebellum. 2. Underlying moderately advanced cerebral atrophy. 3. 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 Electronically Signed   By: Jeannine Boga M.D.   On: 11/17/2020 23:50   DG CHEST PORT 1 VIEW  Result Date: 11/15/2020 CLINICAL DATA:  Mental there are dependent. EXAM: PORTABLE CHEST 1 VIEW COMPARISON:  Radiograph yesterday FINDINGS: Endotracheal tube tip is 4.1 cm from the carina. Tip and side port of the enteric tube are below the diaphragm in the stomach. Low lung volumes. Stable cardiomegaly. Unchanged mediastinal contours. Worsening perivascular haziness suspicious for edema. Suspected small pleural effusions. Mild bilateral perihilar atelectasis. No pneumothorax. IMPRESSION: 1. Endotracheal tube tip 4.1 cm from the carina. Enteric tube in place. 2. Cardiomegaly with worsening perivascular haziness suspicious for edema. 3. Suspected small pleural effusions. Electronically Signed   By: Keith Rake M.D.   On: 11/15/2020 01:49   DG CHEST PORT 1 VIEW  Result Date: 11/14/2020 CLINICAL DATA:  Confusion dyspnea. EXAM: PORTABLE CHEST 1 VIEW COMPARISON:  12/04/2020 FINDINGS: The heart is enlarged but appears stable. Prominent  mediastinal and hilar contours are unchanged. Persistent pulmonary vascular congestion and possible mild interstitial edema. No large pleural effusions or pulmonary lesions. No pneumothorax. IMPRESSION: Cardiac enlargement with vascular congestion and possible mild interstitial edema. Electronically Signed   By: Marijo Sanes M.D.   On: 11/14/2020 06:49   DG CHEST PORT 1 VIEW  Result Date: 11/27/2020 CLINICAL DATA:  Sudden onset shortness of breath, hypoxia EXAM: PORTABLE CHEST 1 VIEW COMPARISON:  11/11/2020 FINDINGS: Cardiomegaly. Low lung volumes. Vascular congestion and bilateral airspace opacities in the perihilar and lower lobe regions, likely edema. Suspect small layering effusions.  No acute bony abnormality. Aortic atherosclerosis. IMPRESSION: Low lung volumes. Cardiomegaly.  Suspect mild CHF. Small bilateral layering effusions. Electronically Signed   By: Rolm Baptise M.D.   On: 11/25/2020 19:11   DG CHEST PORT 1 VIEW  Result Date: 11/11/2020 CLINICAL DATA:  Preop for interpretation surgery tomorrow. EXAM: PORTABLE CHEST 1 VIEW COMPARISON:  Two view chest x-ray 10/17/2020 FINDINGS: Heart is enlarged. Lung volumes remain low. Atherosclerotic changes are present at the aortic arch. There is significant airspace disease is present. Aeration of the lung bases is improved. IMPRESSION: Improved aeration of the lung bases bilaterally. Electronically Signed   By: San Morelle M.D.   On: 11/11/2020 13:55   DG Foot Complete Left  Addendum Date: 10/22/2020   ADDENDUM REPORT: 10/20/2020 22:46 ADDENDUM: The accession for a concomitant chest radiograph was mistakenly linked to the foot report. Two-view chest x-ray shows shallow lung inflation with bilateral basilar predominant opacities, likely atelectasis or mild pulmonary edema. There is mild cardiomegaly. No pleural effusion. Electronically Signed   By: Ulyses Jarred M.D.   On: 10/23/2020 22:46   Result Date: 10/16/2020 CLINICAL DATA:  Shortness of  breath and left toe pain EXAM: CHEST - 2 VIEW; LEFT FOOT - COMPLETE 3+ VIEW COMPARISON:  1722 FINDINGS: Unchanged appearance the distal phalanx of the left fourth toe, with mild fragmentation. Advanced vascular calcification. No acute fracture or dislocation. IMPRESSION: Unchanged appearance of the distal phalanx of the left fourth toe, with mild fragmentation. No progressive osteolysis. Electronically Signed: By: Ulyses Jarred M.D. On: 10/12/2020 21:16   EEG adult  Result Date: 11/22/2020 Lora Havens, MD     11/22/2020 10:09 PM Patient Name: Patrick Shelton MRN: [id number] Epilepsy Attending: Lora Havens Referring Physician/Provider: Dr Ina Homes Date: 11/22/2020 Duration: 25.30 mins  Patient history: 80 year old male with altered mental status and concern for anoxic brain injury noted to have abnormal movements concerning for myoclonus or seizure.  EEG to evaluate for seizures.  Level of alertness: comatose  AEDs during EEG study: LEV, propofol  Technical aspects: This EEG study was done with scalp electrodes positioned according to the 10-20 International system of electrode placement. Electrical activity was acquired at a sampling rate of 500Hz  and reviewed with a high frequency filter of 70Hz  and a low frequency filter of 1Hz . EEG data were recorded continuously and digitally stored.  Description: EEG showed continuous generalized background suppression.  EEG was not reactive to tactile and noxious stimulation.   ABNORMALITY -Background suppression, generalized  IMPRESSION: This study was suggestive of profound diffuse encephalopathy. No seizures or definite epileptiform discharges were seen during the study. This eeg pattern is non specific in etiology but could be secondary to sedation, anoxic-hypoxic brain injury in setting of cardiac arrest.   Lora Havens   EEG adult  Result Date: 11/15/2020 Lora Havens, MD     11/15/2020 11:11 AM Patient Name: Patrick Shelton MRN: [id number]  Epilepsy Attending: Lora Havens Referring Physician/Provider: Hayden Pedro, NP Date: 11/15/2020 Duration: 24.37 mins Patient history: 80 year old male with altered mental status and concern for anoxic brain injury noted to have abnormal movements concerning for myoclonus or seizure.  EEG to evaluate for seizures. Level of alertness: comatose AEDs during EEG study: Propofol Technical aspects: This EEG study was done with scalp electrodes positioned according to the 10-20 International system of electrode placement. Electrical activity was acquired at a sampling rate of 500Hz  and reviewed with a high frequency filter of 70Hz  and a low frequency filter  of 1Hz . EEG data were recorded continuously and digitally stored. Description: EEG showed continuous generalized background suppression.  Every 30 seconds to 1 minute patient was noted to have brief whole-body jerk with eye opening.  Concomitant EEG showed generalized polyspikes consistent with myoclonic seizure.  Hyperventilation and photic stimulation were not performed.   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  During the seizure patient was noted to have brief full body jerks with eye opening.  Additionally there was profound diffuse encephalopathy.  This EEG is concerning for anoxic/hypoxic brain injury. Jennelle Human, NP was notified. Priyanka [name]   Overnight EEG with video  Result Date: 11/19/2020 Lora Havens, MD     11/19/2020  9:47 AM Patient Name: Patrick Shelton MRN: [id number] Epilepsy Attending: Lora Havens Referring Physician/Provider: Hayden Pedro, NP Duration: 11/18/2020 1131 to 11/19/2020 0935  Patient history: 80 year old male with altered mental status and concern for anoxic brain injury noted to have abnormal movements concerning for myoclonus or seizure.  EEG to evaluate for seizures.   Level of alertness: comatose  AEDs during EEG study: LEV  Technical aspects: This EEG study was done with scalp electrodes positioned according to the 10-20 International system of electrode placement. Electrical activity was acquired at a sampling rate of 500Hz  and reviewed with a high frequency filter of 70Hz  and a low frequency filter of 1Hz . EEG data were recorded continuously and digitally stored.  Description: EEG showed continuous generalized background attenuation.  EEG was not reactive to tactile and noxious stimulation. Of note, ventilator artifact was seen during parts of study.   ABNORMALITY -Background attenuation, generalized  IMPRESSION: This study was suggestive of profound diffuse encephalopathy. No seizures or definite epileptiform discharges were seen during the study. This eeg pattern is non specific in etiology but could be secondary to sedation, anoxic-hypoxic brain injury in setting of cardiac arrest.   Priyanka [name]   Overnight EEG with video  Result Date: 11/16/2020 Lora Havens, MD     11/16/2020 10:12 AM Patient Name: Patrick Shelton MRN: [id number] Epilepsy Attending: Lora Havens Referring Physician/Provider: Hayden Pedro, NP Duration: 11/15/2020 5784 to 11/16/2020 0959  Patient history: 80 year old male with altered mental status and concern for anoxic brain injury noted to have abnormal movements concerning for myoclonus or seizure.  EEG to evaluate for seizures.  Level of alertness: comatose  AEDs during EEG study: Propofol, LEV  Technical aspects: This EEG study was done with scalp electrodes positioned according to the 10-20 International system of electrode placement. Electrical activity was acquired at a sampling rate of 500Hz  and reviewed with a high frequency filter of 70Hz  and a low frequency filter of 1Hz . EEG data were recorded continuously and digitally stored.  Description:   At the beginning of the study, every 30 seconds to 1 minute patient was noted to  have brief whole-body jerk with eye opening.  Concomitant EEG showed generalized polyspikes consistent with myoclonic seizure. As propofol was adjusted, last seizure was noted on 11/15/2020 at 1121   EEG initially showed continuous generalized background suppression. After around 1800on 11/15/2020, eeg showed bursts of low amplitude [id number]  theta-alpha activity lasting 3-5 seconds alternating with generalized eeg suppression lasting 2-8 seconds.  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  During the seizure patient was noted to have brief  full body jerks with eye opening. As sedation was adjusted, myoclonic seizures abated, last seizure on 11/15/2020 at 1121.  Additionally there was severe to profound diffuse encephalopathy. This EEG is concerning for anoxic/hypoxic brain injury. EEG appears to be improving compared to previous day. Lora Havens   ECHOCARDIOGRAM COMPLETE  Result Date: 11/18/2020    ECHOCARDIOGRAM LIMITED REPORT   Patient Name:   Patrick Shelton Date of Exam: 11/15/2020 Medical Rec #:  [id number]      Height:       70.0 in Accession #:    [id number]     Weight:       259.9 lb Date of Birth:  28-Oct-1940      BSA:          2.333 m Patient Age:    4 years       BP:           155/80 mmHg Patient Gender: M              HR:           68 bpm. Exam Location:  Inpatient Procedure: 2D Echo, Cardiac Doppler and Color Doppler Indications:     Cardiac Arrest  History:         Patient has prior history of Echocardiogram examinations, most                  recent 05/10/2020. Arrythmias:Cardiac Arrest and LBBB; Risk                  Factors:Diabetes.  Sonographer:     Bryan Referring Phys:  [place] Diagnosing Phys: Oswaldo Milian MD IMPRESSIONS  1. Left ventricular ejection fraction, by estimation, is 60 to 65%. The left ventricle has normal function. Left  ventricular diastolic parameters are indeterminate.  2. Right ventricular systolic function is normal. The right ventricular size is normal.  3. The mitral valve is normal in structure. No evidence of mitral valve regurgitation.  4. The aortic valve was not well visualized. Aortic valve regurgitation is not visualized. Mild aortic valve stenosis. FINDINGS  Left Ventricle: Left ventricular ejection fraction, by estimation, is 60 to 65%. The left ventricle has normal function. The left ventricular internal cavity size was normal in size. There is no left ventricular hypertrophy. Left ventricular diastolic parameters are indeterminate. Right Ventricle: The right ventricular size is normal. Right vetricular wall thickness was not well visualized. Right ventricular systolic function is normal. Left Atrium: Left atrial size was normal in size. Pericardium: Trivial pericardial effusion is present. Mitral Valve: The mitral valve is normal in structure. Tricuspid Valve: The tricuspid valve is normal in structure. Tricuspid valve regurgitation is trivial. Aortic Valve: The aortic valve was not well visualized. Aortic valve regurgitation is not visualized. Mild aortic stenosis is present. Aortic valve mean gradient measures 10.0 mmHg. Aortic valve peak gradient measures 17.8 mmHg. Aortic valve area, by VTI  measures 2.06 cm. Pulmonic Valve: The pulmonic valve was not well visualized. Pulmonic valve regurgitation is trivial. Aorta: The aortic root and ascending aorta are structurally normal, with no evidence of dilitation. IAS/Shunts: The interatrial septum was not well visualized. LEFT VENTRICLE PLAX 2D LVIDd:         5.30 cm     Diastology LVIDs:         3.00 cm     LV e' medial:    6.42 cm/s LV PW:         0.90 cm  LV E/e' medial:  15.2 LV IVS:        1.00 cm     LV e' lateral:   11.20 cm/s LVOT diam:     1.90 cm     LV E/e' lateral: 8.7 LV SV:         94 LV SV Index:   40 LVOT Area:     2.84 cm  LV Volumes (MOD) LV vol  d, MOD A4C: 67.3 ml LV vol s, MOD A4C: 29.5 ml LV SV MOD A4C:     67.3 ml RIGHT VENTRICLE            IVC RV S prime:     8.92 cm/s  IVC diam: 2.30 cm TAPSE (M-mode): 2.1 cm LEFT ATRIUM             Index LA diam:        3.30 cm 1.41 cm/m LA Vol (A2C):   37.9 ml 16.24 ml/m LA Vol (A4C):   36.0 ml 15.43 ml/m LA Biplane Vol: 38.1 ml 16.33 ml/m  AORTIC VALVE                    PULMONIC VALVE AV Area (Vmax):    1.87 cm     PV Vmax:       0.76 m/s AV Area (Vmean):   1.97 cm     PV Peak grad:  2.3 mmHg AV Area (VTI):     2.06 cm AV Vmax:           211.00 cm/s AV Vmean:          148.000 cm/s AV VTI:            0.454 m AV Peak Grad:      17.8 mmHg AV Mean Grad:      10.0 mmHg LVOT Vmax:         139.00 cm/s LVOT Vmean:        103.000 cm/s LVOT VTI:          0.330 m LVOT/AV VTI ratio: 0.73  AORTA Ao Root diam: 3.40 cm Ao Asc diam:  3.70 cm MITRAL VALVE               TRICUSPID VALVE MV Area (PHT): 3.73 cm    TR Peak grad:   14.9 mmHg MV E velocity: 97.90 cm/s  TR Vmax:        193.00 cm/s MV A velocity: 81.50 cm/s MV E/A ratio:  1.20        SHUNTS                            Systemic VTI:  0.33 m                            Systemic Diam: 1.90 cm Oswaldo Milian MD Electronically signed by Oswaldo Milian MD Signature Date/Time: 11/15/2020/4:36:39 PM    Final (Updating)    VAS [place] LOWER EXTREMITY ARTERIAL DUPLEX  Result Date: 11/07/2020 LOWER EXTREMITY ARTERIAL DUPLEX STUDY Patient Name:  Patrick Shelton  Date of Exam:   11/07/2020 Medical Rec #: [id number]       Accession #:    [id number] Date of Birth: 08/02/1940       Patient Gender: M Patient Age:   75 years Exam Location:  [facility] Procedure:      VAS [place] LOWER EXTREMITY ARTERIAL  DUPLEX Referring Phys: Karoline Caldwell --------------------------------------------------------------------------------  Indications: Ulceration, gangrene, and peripheral artery disease. High Risk Factors: Hypertension, hyperlipidemia, Diabetes, past history of                     smoking.  Vascular Interventions: Right BKA. Current ABI:            n/a Limitations: Body habitus, limited patient cooperation, tachycardia Comparison Study: No prior LEA duplex Performing Technologist: Sharion Dove RVS  Examination Guidelines: A complete evaluation includes B-mode imaging, spectral Doppler, color Doppler, and power Doppler as needed of all accessible portions of each vessel. Bilateral testing is considered an integral part of a complete examination. Limited examinations for reoccurring indications may be performed as noted.    +----------+--------+-----+---------------+-----------+--------+ LEFT      PSV cm/sRatioStenosis       Waveform   Comments +----------+--------+-----+---------------+-----------+--------+ CFA Prox  121                         multiphasic         +----------+--------+-----+---------------+-----------+--------+ SFA Prox  205          50-74% stenosismultiphasic         +----------+--------+-----+---------------+-----------+--------+ SFA Mid   82                          multiphasic         +----------+--------+-----+---------------+-----------+--------+ SFA Distal87                          multiphasic         +----------+--------+-----+---------------+-----------+--------+ POP Prox  47                          monophasic          +----------+--------+-----+---------------+-----------+--------+ POP Distal29                          multiphasic         +----------+--------+-----+---------------+-----------+--------+ PTA Prox  79                          monophasic          +----------+--------+-----+---------------+-----------+--------+ PTA Mid   36                          monophasic          +----------+--------+-----+---------------+-----------+--------+ PTA Distal141          30-49% stenosismonophasic          +----------+--------+-----+---------------+-----------+--------+  Summary: 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 30-49% stenosis noted in the posterior tibial artery.  See table(s) above for measurements and observations. Electronically signed by Jamelle Haring on 11/07/2020 at 3:32:34 PM.    Final     Microbiology No results found for this or any previous visit (from the past 240 hour(s)).  Lab Basic Metabolic Panel: Recent Labs  Lab 11/22/20 0529 11/23/20 0434 11/24/20 0518  NA 153* 152* 148*  K 5.1 4.5 4.8  CL 118* 122* 120*  CO2 22 19* 18*  GLUCOSE 235* 180* 155*  BUN 73* 83* 82*  CREATININE 2.71* 3.11* 3.23*  CALCIUM 9.0 8.8* 9.0   Liver Function Tests: No results for input(s): AST, ALT, ALKPHOS, BILITOT, PROT,  ALBUMIN in the last 168 hours. No results for input(s): LIPASE, AMYLASE in the last 168 hours. No results for input(s): AMMONIA in the last 168 hours. CBC: Recent Labs  Lab 11/23/20 0434 11/24/20 0518 11/25/20 0412  WBC 11.9* 12.6* 10.2  HGB 8.5* 8.6* 8.9*  HCT 26.7* 27.3* 28.1*  MCV 97.4 97.8 98.6  PLT 165 162 160   Cardiac Enzymes: No results for input(s): CKTOTAL, CKMB, CKMBINDEX, TROPONINI in the last 168 hours. Sepsis Labs: Recent Labs  Lab 11/23/20 0434 11/24/20 0518 11/25/20 0412  WBC 11.9* 12.6* 10.2    Candee Furbish 12/16/20, 8:15 AM

## 2020-12-07 NOTE — Progress Notes (Signed)
[place] visited pt. per RN referral for support for family int he wake of pt.'s death this morning.  Pt.'s wife and nephew at bedside requested prayer for strength.  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  No further needs expressed at this time but family is aware of chaplains' availability if needed.

## 2020-12-07 DEATH — deceased

## 2020-12-30 DIAGNOSIS — Z20822 Contact with and (suspected) exposure to covid-19: Secondary | ICD-10-CM | POA: Diagnosis not present
